# Patient Record
Sex: Male | Born: 1935
Health system: Southern US, Community
[De-identification: ages and names within clinical notes are randomized; demographics above are authoritative.]

## PROBLEM LIST (undated history)

## (undated) DIAGNOSIS — M1990 Unspecified osteoarthritis, unspecified site: Secondary | ICD-10-CM

## (undated) DIAGNOSIS — I10 Essential (primary) hypertension: Secondary | ICD-10-CM

## (undated) DIAGNOSIS — K859 Acute pancreatitis without necrosis or infection, unspecified: Secondary | ICD-10-CM

## (undated) DIAGNOSIS — A0472 Enterocolitis due to Clostridium difficile, not specified as recurrent: Secondary | ICD-10-CM

## (undated) DIAGNOSIS — I2510 Atherosclerotic heart disease of native coronary artery without angina pectoris: Secondary | ICD-10-CM

## (undated) DIAGNOSIS — N400 Enlarged prostate without lower urinary tract symptoms: Secondary | ICD-10-CM

## (undated) DIAGNOSIS — K8020 Calculus of gallbladder without cholecystitis without obstruction: Secondary | ICD-10-CM

## (undated) DIAGNOSIS — I341 Nonrheumatic mitral (valve) prolapse: Secondary | ICD-10-CM

## (undated) DIAGNOSIS — E785 Hyperlipidemia, unspecified: Secondary | ICD-10-CM

## (undated) DIAGNOSIS — M519 Unspecified thoracic, thoracolumbar and lumbosacral intervertebral disc disorder: Secondary | ICD-10-CM

## (undated) DIAGNOSIS — I498 Other specified cardiac arrhythmias: Secondary | ICD-10-CM

## (undated) HISTORY — DX: Essential (primary) hypertension: I10

## (undated) HISTORY — PX: CHOLECYSTECTOMY: SHX55

## (undated) HISTORY — DX: Atherosclerotic heart disease of native coronary artery without angina pectoris: I25.10

## (undated) HISTORY — DX: Other specified cardiac arrhythmias: I49.8

## (undated) HISTORY — DX: Unspecified osteoarthritis, unspecified site: M19.90

## (undated) HISTORY — DX: Unspecified thoracic, thoracolumbar and lumbosacral intervertebral disc disorder: M51.9

## (undated) HISTORY — PX: COLONOSCOPY: SHX174

## (undated) HISTORY — DX: Hyperlipidemia, unspecified: E78.5

## (undated) HISTORY — DX: Benign prostatic hyperplasia without lower urinary tract symptoms: N40.0

## (undated) HISTORY — DX: Acute pancreatitis without necrosis or infection, unspecified: K85.90

## (undated) HISTORY — DX: Nonrheumatic mitral (valve) prolapse: I34.1

## (undated) HISTORY — DX: Calculus of gallbladder without cholecystitis without obstruction: K80.20

---
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
## 1998-10-01 ENCOUNTER — Ambulatory Visit (HOSPITAL_COMMUNITY): Admission: RE | Admit: 1998-10-01 | Discharge: 1998-10-01 | Payer: Self-pay | Admitting: Internal Medicine

## 2000-03-18 ENCOUNTER — Other Ambulatory Visit: Admission: RE | Admit: 2000-03-18 | Discharge: 2000-03-18 | Payer: Self-pay | Admitting: Urology

## 2000-03-18 ENCOUNTER — Encounter (INDEPENDENT_AMBULATORY_CARE_PROVIDER_SITE_OTHER): Payer: Self-pay | Admitting: Specialist

## 2000-07-10 ENCOUNTER — Ambulatory Visit (HOSPITAL_COMMUNITY): Admission: RE | Admit: 2000-07-10 | Discharge: 2000-07-10 | Payer: Self-pay | Admitting: Cardiology

## 2000-08-11 HISTORY — PX: MITRAL VALVE REPAIR: SHX2039

## 2000-08-11 HISTORY — PX: CORONARY ARTERY BYPASS GRAFT: SHX141

## 2000-08-28 ENCOUNTER — Encounter: Payer: Self-pay | Admitting: Cardiothoracic Surgery

## 2000-09-01 ENCOUNTER — Encounter (INDEPENDENT_AMBULATORY_CARE_PROVIDER_SITE_OTHER): Payer: Self-pay | Admitting: *Deleted

## 2000-09-01 ENCOUNTER — Encounter: Payer: Self-pay | Admitting: Cardiothoracic Surgery

## 2000-09-01 ENCOUNTER — Inpatient Hospital Stay (HOSPITAL_COMMUNITY): Admission: RE | Admit: 2000-09-01 | Discharge: 2000-09-07 | Payer: Self-pay | Admitting: Cardiothoracic Surgery

## 2000-09-02 ENCOUNTER — Encounter: Payer: Self-pay | Admitting: Cardiothoracic Surgery

## 2000-09-03 ENCOUNTER — Encounter: Payer: Self-pay | Admitting: Cardiothoracic Surgery

## 2000-09-04 ENCOUNTER — Encounter: Payer: Self-pay | Admitting: Cardiothoracic Surgery

## 2000-10-06 ENCOUNTER — Encounter (HOSPITAL_COMMUNITY): Admission: RE | Admit: 2000-10-06 | Discharge: 2001-01-04 | Payer: Self-pay | Admitting: Cardiology

## 2000-12-29 ENCOUNTER — Encounter: Payer: Self-pay | Admitting: Family Medicine

## 2000-12-29 ENCOUNTER — Encounter: Admission: RE | Admit: 2000-12-29 | Discharge: 2000-12-29 | Payer: Self-pay | Admitting: Family Medicine

## 2001-10-13 ENCOUNTER — Encounter: Payer: Self-pay | Admitting: Specialist

## 2001-10-13 ENCOUNTER — Encounter: Admission: RE | Admit: 2001-10-13 | Discharge: 2001-10-13 | Payer: Self-pay | Admitting: Specialist

## 2004-09-26 ENCOUNTER — Emergency Department (HOSPITAL_COMMUNITY): Admission: EM | Admit: 2004-09-26 | Discharge: 2004-09-26 | Payer: Self-pay | Admitting: *Deleted

## 2005-08-01 ENCOUNTER — Ambulatory Visit: Payer: Self-pay | Admitting: Internal Medicine

## 2005-09-03 ENCOUNTER — Ambulatory Visit: Payer: Self-pay | Admitting: Internal Medicine

## 2006-12-22 ENCOUNTER — Ambulatory Visit: Payer: Self-pay | Admitting: Family Medicine

## 2007-02-25 ENCOUNTER — Ambulatory Visit: Payer: Self-pay | Admitting: Family Medicine

## 2007-03-26 ENCOUNTER — Ambulatory Visit: Payer: Self-pay | Admitting: Internal Medicine

## 2007-04-13 ENCOUNTER — Ambulatory Visit: Payer: Self-pay | Admitting: Internal Medicine

## 2007-04-13 LAB — CONVERTED CEMR LAB
Basophils Absolute: 0 10*3/uL (ref 0.0–0.1)
Eosinophils Absolute: 0.3 10*3/uL (ref 0.0–0.6)
HCT: 37.5 % — ABNORMAL LOW (ref 39.0–52.0)
MCHC: 35.5 g/dL (ref 30.0–36.0)
Monocytes Relative: 12.9 % — ABNORMAL HIGH (ref 3.0–11.0)
Platelets: 241 10*3/uL (ref 150–400)
RBC: 4.08 M/uL — ABNORMAL LOW (ref 4.22–5.81)
RDW: 12.8 % (ref 11.5–14.6)

## 2007-07-12 ENCOUNTER — Ambulatory Visit: Payer: Self-pay | Admitting: Internal Medicine

## 2007-12-23 ENCOUNTER — Ambulatory Visit: Payer: Self-pay | Admitting: Family Medicine

## 2008-01-27 ENCOUNTER — Ambulatory Visit: Payer: Self-pay | Admitting: Family Medicine

## 2008-02-23 ENCOUNTER — Ambulatory Visit: Payer: Self-pay | Admitting: Family Medicine

## 2008-04-10 ENCOUNTER — Ambulatory Visit: Payer: Self-pay | Admitting: Family Medicine

## 2008-05-02 ENCOUNTER — Encounter: Payer: Self-pay | Admitting: Internal Medicine

## 2008-07-11 ENCOUNTER — Ambulatory Visit: Payer: Self-pay | Admitting: Family Medicine

## 2008-08-18 ENCOUNTER — Ambulatory Visit: Payer: Self-pay | Admitting: Family Medicine

## 2008-10-10 ENCOUNTER — Ambulatory Visit: Payer: Self-pay | Admitting: Family Medicine

## 2008-10-31 ENCOUNTER — Ambulatory Visit: Payer: Self-pay | Admitting: Family Medicine

## 2008-12-27 ENCOUNTER — Ambulatory Visit: Payer: Self-pay | Admitting: Family Medicine

## 2009-06-07 ENCOUNTER — Ambulatory Visit: Payer: Self-pay | Admitting: Family Medicine

## 2009-10-01 ENCOUNTER — Ambulatory Visit: Payer: Self-pay | Admitting: Family Medicine

## 2009-11-01 ENCOUNTER — Ambulatory Visit: Payer: Self-pay | Admitting: Family Medicine

## 2009-11-08 ENCOUNTER — Ambulatory Visit (HOSPITAL_COMMUNITY): Admission: RE | Admit: 2009-11-08 | Discharge: 2009-11-08 | Payer: Self-pay | Admitting: Family Medicine

## 2009-11-08 ENCOUNTER — Encounter: Payer: Self-pay | Admitting: Family Medicine

## 2009-11-08 ENCOUNTER — Ambulatory Visit: Payer: Self-pay | Admitting: Surgery

## 2009-12-19 ENCOUNTER — Ambulatory Visit: Payer: Self-pay | Admitting: Family Medicine

## 2009-12-20 ENCOUNTER — Encounter: Admission: RE | Admit: 2009-12-20 | Discharge: 2009-12-20 | Payer: Self-pay | Admitting: Family Medicine

## 2010-05-02 ENCOUNTER — Ambulatory Visit: Payer: Self-pay | Admitting: Cardiology

## 2010-07-16 ENCOUNTER — Ambulatory Visit: Payer: Self-pay | Admitting: Family Medicine

## 2010-09-23 ENCOUNTER — Ambulatory Visit (INDEPENDENT_AMBULATORY_CARE_PROVIDER_SITE_OTHER): Payer: MEDICARE | Admitting: Family Medicine

## 2010-09-23 DIAGNOSIS — J069 Acute upper respiratory infection, unspecified: Secondary | ICD-10-CM

## 2010-10-01 ENCOUNTER — Ambulatory Visit (INDEPENDENT_AMBULATORY_CARE_PROVIDER_SITE_OTHER): Payer: MEDICARE | Admitting: Family Medicine

## 2010-10-01 DIAGNOSIS — J209 Acute bronchitis, unspecified: Secondary | ICD-10-CM

## 2010-10-01 DIAGNOSIS — J0100 Acute maxillary sinusitis, unspecified: Secondary | ICD-10-CM

## 2010-10-14 ENCOUNTER — Other Ambulatory Visit: Payer: Self-pay | Admitting: Family Medicine

## 2010-10-14 ENCOUNTER — Ambulatory Visit
Admission: RE | Admit: 2010-10-14 | Discharge: 2010-10-14 | Disposition: A | Discharge status: Home / Self Care | Payer: MEDICARE | Source: Ambulatory Visit | Attending: Family Medicine | Admitting: Family Medicine

## 2010-10-14 DIAGNOSIS — R05 Cough: Secondary | ICD-10-CM

## 2010-11-04 ENCOUNTER — Ambulatory Visit (INDEPENDENT_AMBULATORY_CARE_PROVIDER_SITE_OTHER): Payer: MEDICARE | Admitting: Family Medicine

## 2010-11-04 DIAGNOSIS — R0989 Other specified symptoms and signs involving the circulatory and respiratory systems: Secondary | ICD-10-CM

## 2010-11-04 DIAGNOSIS — I4949 Other premature depolarization: Secondary | ICD-10-CM

## 2010-11-04 DIAGNOSIS — R05 Cough: Secondary | ICD-10-CM

## 2010-11-06 ENCOUNTER — Encounter: Payer: Self-pay | Admitting: Nurse Practitioner

## 2010-11-06 ENCOUNTER — Encounter: Payer: Self-pay | Admitting: Cardiology

## 2010-11-06 ENCOUNTER — Ambulatory Visit (INDEPENDENT_AMBULATORY_CARE_PROVIDER_SITE_OTHER): Payer: MEDICARE | Admitting: Nurse Practitioner

## 2010-11-06 VITALS — BP 130/80 | HR 70 | Wt 193.0 lb

## 2010-11-06 DIAGNOSIS — R0600 Dyspnea, unspecified: Secondary | ICD-10-CM | POA: Insufficient documentation

## 2010-11-06 DIAGNOSIS — I10 Essential (primary) hypertension: Secondary | ICD-10-CM | POA: Insufficient documentation

## 2010-11-06 DIAGNOSIS — I493 Ventricular premature depolarization: Secondary | ICD-10-CM | POA: Insufficient documentation

## 2010-11-06 DIAGNOSIS — R002 Palpitations: Secondary | ICD-10-CM

## 2010-11-06 DIAGNOSIS — R079 Chest pain, unspecified: Secondary | ICD-10-CM

## 2010-11-06 DIAGNOSIS — Z9889 Other specified postprocedural states: Secondary | ICD-10-CM | POA: Insufficient documentation

## 2010-11-06 DIAGNOSIS — R059 Cough, unspecified: Secondary | ICD-10-CM | POA: Insufficient documentation

## 2010-11-06 DIAGNOSIS — E785 Hyperlipidemia, unspecified: Secondary | ICD-10-CM

## 2010-11-06 DIAGNOSIS — I4949 Other premature depolarization: Secondary | ICD-10-CM

## 2010-11-06 DIAGNOSIS — R05 Cough: Secondary | ICD-10-CM

## 2010-11-06 DIAGNOSIS — R0609 Other forms of dyspnea: Secondary | ICD-10-CM

## 2010-11-06 DIAGNOSIS — R5381 Other malaise: Secondary | ICD-10-CM

## 2010-11-06 DIAGNOSIS — R634 Abnormal weight loss: Secondary | ICD-10-CM | POA: Insufficient documentation

## 2010-11-06 DIAGNOSIS — R5383 Other fatigue: Secondary | ICD-10-CM

## 2010-11-06 DIAGNOSIS — I2510 Atherosclerotic heart disease of native coronary artery without angina pectoris: Secondary | ICD-10-CM | POA: Insufficient documentation

## 2010-11-06 LAB — BASIC METABOLIC PANEL
Calcium: 9.1 mg/dL (ref 8.4–10.5)
Glucose, Bld: 97 mg/dL (ref 70–99)
Potassium: 3.7 mEq/L (ref 3.5–5.3)
Sodium: 141 mEq/L (ref 135–145)

## 2010-11-06 NOTE — Assessment & Plan Note (Signed)
?   Etiology. Labs are checked today to include TSH.

## 2010-11-06 NOTE — Assessment & Plan Note (Signed)
He is down 7 pounds since September. Will continue to monitor. He has had a negative CXR. If his labs and echo are ok, would consider CT scanning for further evaluation.

## 2010-11-06 NOTE — Progress Notes (Signed)
History of Present Illness: [name] [name] is seen today for a work in visit. He is seen for Dr. Martinique. He has not been feeling well for the past 6 weeks. He has had this cough that will not go away. It is not productive. He has also been tired and having DOE. This is clearly a change for him. He denies chest pain. He has been seeing his PCP. He has had a negative CXR. An EKG showed PVC's.  He has not had any labs. He has lost about 7 pounds of weight which is unusual for him. He is not really dizzy or lightheaded. He has no edema. The cough does not occur when he lies down. No PND or orthopnea is reported.   Current Outpatient Prescriptions on File Prior to Visit  Medication Sig Dispense Refill  . amLODipine (NORVASC) 5 MG tablet Take 5 mg by mouth daily.        Marland Kitchen aspirin 325 MG EC tablet Take 325 mg by mouth daily.        . Cholecalciferol (VITAMIN D) 1000 UNITS capsule Take 1,000 Units by mouth daily.        Marland Kitchen dutasteride (AVODART) 0.5 MG capsule Take 0.5 mg by mouth daily.        . multivitamin (THERAGRAN) per tablet Take 1 tablet by mouth daily.        . rosuvastatin (CRESTOR) 20 MG tablet Take 20 mg by mouth daily.        . Tamsulosin HCl (FLOMAX) 0.4 MG CAPS Take 0.4 mg by mouth daily.        . valsartan-hydrochlorothiazide (DIOVAN-HCT) 320-12.5 MG per tablet Take 1 tablet by mouth daily.        . nebivolol (BYSTOLIC) 5 MG tablet Take 5 mg by mouth daily.          No Known Allergies  Past Medical History  Diagnosis Date  . MVP (mitral valve prolapse)     s/p Mitral Valve Repair  . CAD (coronary artery disease)     s/p CABG x 1  . HTN (hypertension)   . Hyperlipidemia   . DJD (degenerative joint disease)   . BPH (benign prostatic hyperplasia)   . Arthritis   . Lumbar disc disease     Past Surgical History  Procedure Date  . Cholecystectomy   . Mitral valve repair January 2002  . Coronary artery bypass graft January 2002    Single bypass to the distal RCA    History  Smoking  status  . Never Smoker   Smokeless tobacco  . Not on file    History  Alcohol Use No    Family History  Problem Relation Age of Onset  . Stroke Father     Review of Systems: The review of systems is as above. His blood pressure has been averaging in the 572'I systolic at home. Rarely, it will be elevated.  All other systems were reviewed and are negative.  Physical Exam: BP 130/80  Pulse 70  Wt 193 lb (87.544 kg) He is pleasant and in no acute distress. Skin is warm and dry. Color is normal. HEENT is negative. Lungs are clear. Cardiac exam shows a regular rate and rhythm. He has frequent ectopics.He has a soft systolic murmur.  Abdomen is soft. Extremities are without edema. Gait and ROM are intact. He has no gross neurologic deficits.  ECG: Sinus with PVC's and PAC's today. Tracing is reviewed with Dr. Martinique.  Assessment / Plan:

## 2010-11-06 NOTE — Assessment & Plan Note (Signed)
?   Etiology. Will send for an echo. His last study was in 2009. Will check a BNP, CBC, CMET and TSH today.

## 2010-11-06 NOTE — Assessment & Plan Note (Signed)
PVC's noted on today's EKG. He tries to limit his caffeine intake and does not have excessive alcohol. He is asymptomatic. Will await echo results.

## 2010-11-06 NOTE — Patient Instructions (Signed)
We are going to check labs today.  We are going to send you for a repeat ultrasound of your heart. Stay on your current medicines. Keep your appointment with Dr. Martinique in April.

## 2010-11-06 NOTE — Assessment & Plan Note (Signed)
He is not on ACE inhibitor. He has had a negative CXR. May need to consider PPI therapy. May need CT of the chest on return.

## 2010-11-07 ENCOUNTER — Telehealth: Payer: Self-pay | Admitting: *Deleted

## 2010-11-07 ENCOUNTER — Ambulatory Visit: Payer: MEDICARE | Admitting: Nurse Practitioner

## 2010-11-07 LAB — BRAIN NATRIURETIC PEPTIDE: Brain Natriuretic Peptide: 64.1 pg/mL (ref 0.0–100.0)

## 2010-11-07 NOTE — Telephone Encounter (Signed)
Notified of lab results. 

## 2010-11-07 NOTE — Telephone Encounter (Signed)
Message copied by Derek Mound on Thu Nov 07, 2010  4:25 PM ------      Message from: Truitt Merle      Created: Thu Nov 07, 2010  1:18 PM       Ok to report. Labs are satisfactory. BNP is nice and low. Dyspnea probably more pulmonary related.      He could do a trial of PPI therapy for his cough (Prilosec, nexium, etc.)       Will see what echo shows.

## 2010-11-13 ENCOUNTER — Ambulatory Visit (HOSPITAL_COMMUNITY): Payer: MEDICARE | Attending: Nurse Practitioner | Admitting: Radiology

## 2010-11-13 DIAGNOSIS — Z9889 Other specified postprocedural states: Secondary | ICD-10-CM

## 2010-11-13 DIAGNOSIS — R059 Cough, unspecified: Secondary | ICD-10-CM | POA: Insufficient documentation

## 2010-11-13 DIAGNOSIS — R05 Cough: Secondary | ICD-10-CM | POA: Insufficient documentation

## 2010-11-13 DIAGNOSIS — I059 Rheumatic mitral valve disease, unspecified: Secondary | ICD-10-CM | POA: Insufficient documentation

## 2010-11-18 ENCOUNTER — Telehealth: Payer: Self-pay | Admitting: *Deleted

## 2010-11-18 NOTE — Telephone Encounter (Signed)
Message copied by Derek Mound on Mon Nov 18, 2010  2:01 PM ------      Message from: Martinique, PETER      Created: Thu Nov 14, 2010 12:17 PM       I reviewed recent Echo and compared with prior study in 2009. I disagree with the report in that the LV size is only mildly dilated, not severe. EF is well preserved. Atrial enlargement is stable. MV repair still looks good. Overall a good report. Please report to patient.

## 2010-11-18 NOTE — Telephone Encounter (Signed)
Notified of Echo results; sent copy to Dr. Redmond School

## 2010-11-27 ENCOUNTER — Ambulatory Visit: Payer: MEDICARE | Admitting: Cardiology

## 2010-11-27 ENCOUNTER — Telehealth: Payer: Self-pay | Admitting: *Deleted

## 2010-11-27 NOTE — Telephone Encounter (Signed)
Came by and gave list of BP readings; range 126/83 to 153/81; Per Dr. Martinique continue to take same meds. Continue to monitor BP. OV in May.

## 2010-12-05 ENCOUNTER — Institutional Professional Consult (permissible substitution): Payer: MEDICARE | Admitting: Pulmonary Disease

## 2010-12-24 NOTE — Assessment & Plan Note (Signed)
[place]                             PULMONARY OFFICE NOTE   NAME:Schnorr, GEAN                        MRN:          [id number]  DATE:03/26/2007                            DOB:          10-12-1929    For pulmonary followup.   PROBLEM:  Cough.   HISTORY:  When seen over a year ago cough had responded to change from  lisinopril to Diovan and was attributed to ACE inhibitor.  He had no  problems from then until about a month ago when he developed a  significant head cold, sinus congestion, sore throat with some fever.  Dr. Redmond School treated with an antibiotic which helped some, clearing most  of his head discomfort but with persistent cough.  He also tried a  prednisone taper which has not made enough difference.  Cough is noted  mostly if he is up and ambulatory.  He has some dry throat sensation  with no difficulty swallowing.  Feels tired. Denies fever, sweats,  purulent discharge.  He is definitely aware of occasional reflux.  Says  cough is tiresome.  Has noted urinary frequency   CURRENT MEDICATIONS:  Flomax 0.4 mg, Vytorin 10/20, Avodart 0.5 mg,  Cosamin DS, aspirin 325 mg, Diovan 320/12.5, Norvasc 5 mg, prednisone  ending 03/27/2007.   ALLERGIES:  No medication allergy.  ACE INHIBITOR CAUSED COUGH.   OBJECTIVE:  VITAL SIGNS:  Weight 197 pounds, blood pressure 132/78,  pulse 73, room air saturation 97%.  HEENT:  Pharynx is red and cobblestoned without visible drainage.  Not  really hoarse, some dry cough noted, no stridor.  LUNGS:  Clear.  No adenopathy.   IMPRESSION:  Red pharynx suggests either rhinosinusitis with drainage or  gastroesophageal reflux disease.   PLAN:  Try Doxycycline for 7 days, Phenergan with codeine cough syrup 5  mL q.6 hours p.r.n.  If no better he is then to start samples of Zegerid  40/1100 one capsule daily as discussed.  Return as scheduled.     Clinton D. Annamaria Boots, MD, Shade Flood, FACP  Electronically Signed    CDY/MedQ  DD: 03/26/2007  DT: 03/27/2007  Job #: [id number]   cc:   Jill Alexanders, M.D.  Peter M. Martinique, M.D.

## 2010-12-24 NOTE — Assessment & Plan Note (Signed)
[place]                             PULMONARY OFFICE NOTE   NAME:[name] [name], [name] [name]                        MRN:          [id number]  DATE:04/13/2007                            DOB:          [birth year]    PROBLEM:  1. Bronchitis with cough.  2. History ACE inhibitor cough.  3. Rhinitis.   HISTORY:  Cough had cleared for awhile when lisinopril was stopped.  Now  in the last few days especially he has had deeper cough and some wheeze.  Three days ago frontal sinuses felt congested and hurt him when he  coughed.  Not much discharge.  Today the frontal sinus pain is cleared.  He is still trying Zegerid samples but does not recognize reflux.  No  cough at night or when quiet.  Trace yellow phlegm.  Little postnasal  drip.  He had used a nasal steroid in the spring.  No nasal discharge.  He had previously tried saline, nasal lavage, without help.  Feels  tired.   MEDICATIONS:  His list is charted and reviewed.  Not significantly  changed from August 15 visit.   OBJECTIVE:  Weight 199 pounds, blood pressure 122/80, pulse 75, room air  saturation 97%.  Septal deviation.  Pale nasal mucosa.  Reddish pharynx  without visible drainage.  No stridor.  No adenopathy.  Active gag.  Minor dry cough.  I do not hear wheeze or rhonchi.   IMPRESSION:  Cough, probably with components of bronchitis and rhinitis.  Watch for reflux.   PLAN:  1. Singulair 10 mg.  2. He has some leftover Beconase.  We are going to let him use that up      and then add a sample of Veramyst one or two sprays each nostril      b.i.d. while we explore what nasal steroids will do for him.  3. CBC with diff.  4. Stop Phenergan.  5. Schedule chest x-ray.  6. Schedule return in 2 months, earlier p.r.n.     Clinton D. Annamaria Boots, MD, Shade Flood, FACP  Electronically Signed    CDY/MedQ  DD: 04/13/2007  DT: 04/14/2007  Job #: [id number]   cc:   Jill Alexanders, M.D.  Peter M. Martinique, M.D.

## 2010-12-24 NOTE — Assessment & Plan Note (Signed)
[name] [name]                             PULMONARY OFFICE NOTE   NAME:Gazzola, IMARI                        MRN:          [id number]  DATE:07/12/2007                            DOB:          28-Feb-1930    PROBLEMS:  1. Bronchitis with cough.  2. History ACE inhibitor cough.  3. Rhinitis.   HISTORY:  She has continued Singulair and Beconase. She gradually noted  cough getting much better and now almost gone. She had some wheezing,  but credits Singulair for clearing that. She finished the sample and has  been off almost a month. She thinks, in retrospect, that cough began  with a sinus infection, but there are no symptoms of that. She has had  flu shot.   MEDICATIONS:  Medications were reviewed as charted.   OBJECTIVE:  Weight 208 pounds, blood pressure 128/62, pulse 67, room air  saturation 95%. She is not coughing at all. Lungs are very clear. Nasal  airway is clear with no post-nasal drip or pharyngeal irritation. She  looks comfortable.   IMPRESSION:  Cough may have been from post-nasal drip or a mild  bronchitis with asthma. It seems to be resolved at this time or  inactive.   PLAN:  We discussed Singulair and I gave refillable prescription. She  may want to see how she does without it. I will be happy to see her  again p.r.n. and this may become useful as the seasons change.     Clinton D. Annamaria Boots, MD, Shade Flood, FACP  Electronically Signed    CDY/MedQ  DD: 07/18/2007  DT: 07/19/2007  Job #: [id number]   cc:   Jill Alexanders, M.D.

## 2010-12-27 NOTE — Discharge Summary (Signed)
[place]. [facility]  Patient:    [name] [name], [name] [name]                        MRN: [id number] Adm. Date:  [id number] Disc. Date: [id number] Attending:  Montine Circle Dictator:   Ricki Miller, P.A. CC:         Peter M. Martinique, M.D.  Synthia Innocent, M.D.   Discharge Summary  HISTORY OF PRESENT ILLNESS:  This is a very pleasant 77 year old male referred by Dr. Martinique for evaluation of mitral valve disease.  In September 2001, a murmur was detected in the routine physical exam.  Initially, he was asymptomatic.  Later, the patient experienced several episodes of vertigo and vague anginal symptoms.  CT scan and carotid ultrasound were negative.  A 2D echocardiogram revealed marked prolapse of the posterior leaflet of the mitral valve with moderate to severe mitral regurgitation and moderate left atrial enlargement.  Cardiac catheterization was subsequently done and this revealed normal coronary arteries with severe mitral regurgitation on ventriculogram. His left ventricular function was preserved.  Dr. Servando Snare recommended mitral valve repair versus replacement and he was admitted this hospitalization for the procedure.  PAST MEDICAL HISTORY: 1. Mitral valve prolapse with moderate to severe mitral insufficiency. 2. Hypertension. 3. Benign prostatic hypertrophy with two negative biopsies. 4. Arthritis in the hands. 5. Previous history of acute pancreatitis. 6. Chronic back pain. 7. Mild aortic insufficiency. 8. Questionable history of retinal detachment.  PAST SURGICAL HISTORY:  Cholecystectomy.  MEDICATIONS: 1. Aspirin 325 mg q.d. 2. Hytrin 10 mg q.d. 3. Altace 5 mg q.d. 4. Toprol XL 25 mg q.d. 5. Glucosamine q.d. 6. Protonix 40 mg q.d. 7. Folic acid 638 mcg 4-6/6 tablets p.o. q.d. 8. Ferrous sulfate 325 mg b.i.d. (Note, the folic acid and ferrous sulfate were on hold since August 27, 2000).  ALLERGIES:  No known drug allergies.  For  family history, social history, review of systems and physical exam, please see the History and Physical done at the time of admission.  HOSPITAL COURSE:  The patient was admitted electively and taken to the operating room where he underwent mitral valve repair with a #31 St. Jude annuloplasty ring and coronary artery bypass graft x 1.  The patient tolerated the procedure well.  Initially, as the patient came off cardiopulmonary bypass, there was some ST elevation in the inferior leads.  There was some questionable opinion as to whether he may have received an air bolus.  He subsequently received the above-mentioned saphenous vein graft to the right coronary artery bypass and came off cardiopulmonary bypass in stable condition.  Transesophageal echocardiogram revealed good left ventricular function and good function of the mitral valve.  Postoperatively, the patient has done well.  He has had stable hemodynamics. He has remained neurologically intact.  He had no elevation in his postoperative CK/MB bands.  He has been started on a course of Coumadin and his INR has been followed.  He did have some difficulties initially with urinary retention, but his Foley was subsequently able to be removed and he has been able to void on his own.  Urine cultures have been negative thus far. He does have some microscopic hematuria noted.  His incisions are healing well without signs of infection.  He is tolerating routine activities commensurate for level of postoperative convalescence.  Overall, he is felt to be quite stable for discharge on September 07, 2000, pending morning round reevaluation by Dr.  Gerhardt.  CONDITION ON DISCHARGE:  Stable and improved.  DISCHARGE DIAGNOSES: 1. Mitral regurgitation and prolapse, as described above. 2. Hypertension. 3. Benign prostatic hyperplasia. 4. Osteoarthritis. 5. History of pancreatitis. 6. Chronic back pain. 7. Status post cholecystectomy.  DISCHARGE  MEDICATIONS: 1. Folic acid 1 mg q.d. 2. Coumadin 5 mg q.d. and as directed. 3. Lasix 40 mg q.d. x 1 week. 4. Potassium chloride 20 mEq q.d. x 1 week. 5. Tylox p.r.n. 6. Cardura 4 mg q.h.s.  FOLLOWUP:  Follow up with Dr. Servando Snare in three weeks.  Follow up with Dr. Martinique in two weeks with INR to be checked on September 09, 2000, at Dr. Morrison Old office.  SPECIAL INSTRUCTIONS:  The patient will receive written instructions in regard to medications, activity, diet, wound care and followup. DD:  09/07/00 TD:  09/07/00 Job: [id number]

## 2010-12-27 NOTE — Procedures (Signed)
[place]. [facility]  Patient:    [name] [name], [name] [name]                        MRN: [id number] Proc. Date: 09/01/00 Adm. Date:  [id number] Attending:  Montine Circle                           Procedure Report  ANESTHESIA PROCEDURE:  Intraoperative transesophageal echocardiography.  INDICATIONS:  [name] [name] is a 77 year old gentleman, who presented today for mitral valve repair or replacement by Dr. Percell Miller B. Gerhardt.  He was brought to the holding area the morning of surgery, where under local anesthesia with sedation, radial, arterial, and pulmonary artery catheters were inserted.  The patient was taken to the operating room for routine induction of general anesthesia, the trachea was intubated, the TEE probe was lubricated and passed oropharyngeally into the stomach and then withdrawn for imaging of the cardiac structures.  PRECARDIOPULMONARY BYPASS TEE EXAMINATION:  LEFT VENTRICLE:  This is a moderately hypertrophied left ventricular chamber, overall left ventricular dimension size is increased.  There was good overall contractility and all segments in the short axis and just above the short axis view.  In a long axis view, there was good overall contractility anterior and posterior walls.  There were no masses noted within.  MITRAL VALVE:  Initially, the mitral valve was grossly abnormal in its appearance, thickened in both leaflets.  There was a significant ballooning appearing in the posterior leaflet that prolapses as well into the left atrial chamber with an almost umbrella looking effect during systolic contraction with the posterior leaflet ballooning well into the left atrium.  In addition, there was a small filament.  I cannot tell whether this is a small ruptured chordae or portion of the leaflet that appeared just above the annular area into the left atrium.  On Doppler examination, there is mitral regurgitant jet noted that goes beneath  the posterior leaflet out across the top of the anterior leaflet and into the left atrial chamber.  LEFT ATRIUM:  This was a mildly enlarged chamber.  The interatrial septum was interrogated and was intact.  All pulmonary veins were able to be demonstrated.  We could not demonstrate any significant reversal of flow in the pulmonary veins.  There were no masses noted within.  The interatrial septum was also interrogated again no masses were noted within.  RIGHT VENTRICLE:  This was normal, heavy trabeculated right ventricular chamber.  TRICUSPID VALVE:   Mobile tricuspid valve.  On Doppler examination, there was 1 to 1.5+ tricuspid regurgitant flow noted.  RIGHT ATRIUM:   Normal right atrial chamber.  Pulmonary artery catheter was noted within.  The patient was placed on cardiopulmonary bypass, hypothermia was begun. Repair of the posterior leaflet was carried out by Dr. Servando Snare, including a #31 ring placement.  Multiple deairing maneuvers were carried out.  The patient was rewarmed and prepared for separation from cardiopulmonary bypass.  POSTCARDIOPULMONARY BYPASS TEE EXAMINATION:  (Limited examination.)  LEFT VENTRICLE:  Early in the bypass period with the initial separation from cardiopulmonary bypass and inspite of multiple deairing maneuvers, there were significant ______ noted in the left atrial and left ventricular chambers and in the aortic root.  The patient was kept in the head down position, the vents were maintained and over a period of time, the air was able to be removed or it disappeared.  MITRAL VALVE:  In place of the diseased mitral valve now could be seen the anterior leaflet well.  The struts and suture lines around the ring could also be visualized.  There was an interesting small filament that appeared attached at the base of where the ring attached in the annular area.  This was a couple of cm to a 1.5 cm in length and appeared in the left atrial chamber.   It was not apparent whether this was just an attached but free end of a chordae or an actual filament from the suture line used.  This remained throughout the bypass period.  On Doppler examination at the level of the mitral valve, there was just trace early regurgitant flow noted at the periphery of the ring. However, the major part of the regurgitant flow was essentially gone.  The leaflets appeared to coapt appropriately during systolic ejection.  Doppler examination revealed essentially minimal mitral regurgitant flow compared to the prebypass period.  During a period of time after separation there was noted significant ST segment elevations.  Examination of the heart at this time revealed what appeared to be a fairly dyskinetic area in the posterior aspect of the septum as it runs into the inferior border, inferior aspect of the heart in the short axis view.  The inferior border in the short axis view appeared remarkably hypokinetic and significantly diminished during these period of these ST elevations.  Please refer to Dr. Carrie Mew note on the subsequent events, but there were multiple episodes, ultimately requiring a return to bypass and a right coronary artery bypass grafting by Dr. Servando Snare.  On the subsequent separation from cardiopulmonary bypass after the single bypass had been performed, the left ventricular chamber appeared to be hyperdynamic, all segments appeared contractile and thickening in the appropriate fashion in the short axis view there was fairly good contractility all around and including the inferior wall, which had been problematic before.  The mitral valve the mitral valve apparatus appeared again to be seated in the usual fashion.  There was essentially no changes from the previous separation from cardiopulmonary bypass.  The degree of mitral regurgitant flow was essentially gone.  The small filament that had appeared before at the periphery was still  remain, but did not appear to be hemodynamically significant in any way other than being present.  The rest of the cardiac examination was essentially unchanged.  The patient  did well after the second separation from cardiopulmonary bypass would refer to the rest of the video tape for demonstration of that.  The patient was returned to the cardiac intensive care unit in stable condition. DD:  09/01/00 TD:  09/01/00 Job: [id number]

## 2010-12-27 NOTE — H&P (Signed)
[place]. [facility]  Patient:    [name] [name], [name] [name]                        MRN: [id number] Adm. Date:  08/31/00 Attending:  Lilia Argue. Servando Snare, M.D. Dictator:   Sharene Butters, P.A. CC:         [name], M.D.  Peter M. Martinique, M.D.  Synthia Innocent, M.D.   History and Physical  DATE OF BIRTH:  [birth year].  CHIEF COMPLAINT:  Mitral valve disease - mitral regurgitation.  HISTORY OF PRESENT ILLNESS:  [name] [name] is a pleasant 77 year old white male referred by Dr. Martinique for evaluation of mitral valve disease.  In September 2001, a murmur was detected in a routine physical exam.  Initially, asymptomatic.  Later on the patient experienced several episodes of vertigo and vague anginal symptoms.  A CT scan - carotid ultrasound were negative. However, a 2-D echocardiogram revealed marked prolapse of the posterior leaflet with moderate to severe MR and moderate left atrial enlargement. Cardiac catheterization showed normal coronary arteries.  He has severe MR on ventriculogram with preserved LV function.  Dr. Servando Snare recommended to proceed with MVR - replacement scheduled for September 01, 2000.  He has been experiencing some anginal symptoms, accompanied with occasional shortness of breath.  He also has dyspnea on exertion when walking up stairs.  No PND.  He denies any decreased energy level.  No peripheral edema.  No polyuria or hematuria.  He does have no nocturia.  No abdominal fullness.  No hematochezia.  No cough or sputum production.  No new episodes of syncope.  No presyncope.  No fever or chills.  PAST MEDICAL HISTORY: 1. MVP with moderate to severe mitral insufficiency. 2. Hypertension. 3. Benign prostatic hypertrophy with two negative biopsies. 4. Arthritis in the hands. 5. Previous history of acute pancreatitis. 6. Chronic back pain. 7. Mild AI. 8. Questionable history of retinal detachment.  SURGERIES: 1. Status post cardiac  catheterization in July 01, 2000, Dr. ______. 2. Status post cholecystectomy in 1995.  MEDICATIONS: 1. Aspirin 325 mg p.o. q.d. on hold. 2. Hytrin 10 q.d. 3. Altace 5 mg q.d. 4. Toprol XL 25 mg q.d. 5. Glucosamine q.d. 6. Protonix 40 mg p.o. q.d. 7. Folic acid 160 micrograms 2-1/2 p.o. q.d. (on hold since August 27, 2000). 8. Ferrous sulfate 325 mg p.o. b.i.d. (on hold since August 27, 2000).  ALLERGIES:  No known drug allergies.  REVIEW OF SYSTEMS:  See history of present illness and past medical history for significant positives.  He denies any history of diabetes, kidney disease, cerebrovascular accident, TIA or amaurosis fugax.  FAMILY HISTORY:  Mother alive and well at age 36.  Father died at 42 of CVA.  SOCIAL HISTORY:  The patient is married and he has two children.  One son is a Magazine features editor at [employer].  He is a retired Programme researcher, broadcasting/film/video at [employer].  He is active, he plays golf.  He used to play tennis, however, he stopped secondary to the symptoms.  No tobacco or alcohol intake.  PHYSICAL EXAMINATION:  Well-developed, well-nourished 77 year old white male in no acute distress.  Alert and oriented x 3 who looks younger than his stated age.  VITAL SIGNS:  Blood pressure 150/82, pulse 64, respirations 18.  HEENT:  Normocephalic and atraumatic, PERRLA, EOMI, bilateral cataract formation.  No glaucoma or macular degeneration.  NECK:  Supple.  A 4 cm JVD bilaterally.  No bruits or  lymphadenopathy.  CHEST:  Symmetrical on inspirations.  LUNGS:  Show no wheezes or rhonchis.   There is a trace of rales at the bases bilaterally.  No lymphadenopathy.  CARDIOVASCULAR:  Regular rate and rhythm with a 3/6 systolic murmur, with late systolic peak, best heard at the apex.  No rubs or gallops.  ABDOMEN:  Soft, nontender.  Bowel sounds x 4. N masses or bruits.  GU AND RECTAL:  Deferred.  EXTREMITIES:  No clubbing or cyanosis.  There is a trace of edema in the  left lower extremity.  NO ulcerations.  Temperature warm.  PERIPHERAL PULSES:  Carotids 2+ bilaterally.  Femoral 2+ bilaterally. Popliteal, dorsalis pedis, and posterior tibialis 2+ bilaterally.  NEUROLOGIC:  Nonfocal, normal gait.  DTRs 2+ bilaterally.  Muscle strength 5/5.  ASSESSMENT AND PLAN:  Mitral valve disease for MVR - replacement on September 01, 2000, at [facility] by Dr. Servando Snare.  Dr. Servando Snare has seen and evaluated this patient prior to the admission and has explained the risks and benefits involving the procedure and the patient has agreed to continue. DD:  08/31/00 TD:  08/31/00 Job: [id number]

## 2010-12-27 NOTE — Op Note (Signed)
[place]. [facility]  Patient:    [name] [name], [name] [name]                        MRN: [id number] Proc. Date: 09/01/00 Adm. Date:  [id number] Attending:  Montine Circle CC:         Peter M. Martinique, M.D.   Operative Report  PREOPERATIVE DIAGNOSIS:  Severe mitral regurgitation.  POSTOPERATIVE DIAGNOSIS:  Severe mitral regurgitation with prolapse and flail segment of the posterior leaflet and question of right coronary artery spasm.  OPERATION:  Mitral valve repair with quadrangular resection of the middle scallop of the posterior leaflet and placement of St. Jude Taylor flexible annuloplasty ring and coronary artery bypass grafting times one to the distal right coronary artery.  SURGEON:  Lilia Argue. Servando Snare, M.D.  FIRST ASSISTANT:  Revonda Standard. Roxan Hockey, M.D.  SECOND ASSISTANT:  Sheliah Hatch, P.A.  BRIEF HISTORY:  The patient is a 77 year old male who presented with increasing symptoms of dyspnea and was noted to have a new murmur.  Further evaluation by Dr. Peter Martinique including echocardiogram confirmed severe mitral regurgitation with preserved LV function.  A cardiac catheterization showed luminal irregularities of the right coronary artery but without severe stenosis in the left or right coronary system.  Because of the patients progressing symptoms and severity of the mitral regurgitation, mitral valve repair was recommended.  The patient agreed and signed informed consent.  DESCRIPTION OF PROCEDURE:  With Swan-Ganz and arterial line monitors in place, the patient underwent general endotracheal anesthesia without incidence.  A transesophageal echocardiogram probe was placed and manipulated by Dr. Orene Desanctis with report under separate dictation; however, the diagnosis was confirmed with TEE with significant mitral regurgitation and prolapsing of the middle scallop of the posterior leaflet.  He also had trace to mild aortic insufficiency and overall  preserved LV function.  He had slight enlargement of the left atrium.  The patients skin of the chest and legs was prepped with Betadine and draped in the usual sterile manner.  A median sternotomy was performed.  The pericardium was opened. The patient did have evidence of biventricular enlargement.  He was systemically heparinized.  The ascending aorta was cannulated.  Superior and inferior vena cava cannulae were placed. A retrograde cardioplegia catheter was placed through a separate stab wound in the right atrium.  An aortic root vent cardioplegia needle was introduced into the ascending aorta.  The patient was placed on cardiopulmonary bypass 2.4 liters per minute per meter squared.  The patients body temperature was then cooled to 30 degrees.  Aortic cross clamp was applied.  Then 500 cc of cold blood potassium cardioplegia was administered antegrade.  The left atrium was opened along the inner atrial groove with proper retraction good exposure of the mitral valve was obtained.  Careful examination of the mitral valve revealed the anterior leaflet was not particularly degenerated with myxomatous degeneration.  The primary lesion noted was severe prolapse of the middle portion of the middle scallop of the posterior leaflet.  It was decided that with the anatomic situation an adequate repair could be obtained.  A portion of the middle scallop of the posterior leaflet was excised as a quadrangular resection.  Using a #2 Tycron pledgeted suture, placed along the annulus at the site of resection.  The leaflet edges of the posterior leaflet both appeared to be the same height and were easily reapproximated with 5-0 Ethibon sutures.  With this repair completed, there was  passive filling of the ventricle with cold saline.  Appeared to give a good repair of the mitral valve.  The anterior leaflet and inner tract distance was sized to use a #31 [employer]. Jude annuloplasty ring.  The ring was  used as a complete flexible ring.  Then #2 Tycron sutures were placed along the mitral valve annulus in circumferential manner.  The ring was then secured to the sutures and lowered into place and secured.  With the ring holder removed, the valve was again tested for competency passively and appeared to be very adequate.  The patients body temperature was rewarmed to 37 degrees.  The aortotomy was closed with a horizontal 3-0 Prolene suture.  Prior to complete closure of the atrium, the head was put in a down position.  The heart was allowed to passively fill and deair the atrium.  With the atrium closed, aortic cross clamp was removed with a total cross clamp time of 103 minutes.  The patient had prompt rise in myocardial septal temperature.  A 16 gauge needle was introduced into the left ventricular apex to further deair the heart.  The patients body temperature was rewarmed to 37 degrees.  Transiently he developed a heart block but prior to coming off bypass it returned to a sinus rhythm.  He was started on low dose dopamine.  TEE showed good repair without evidence of significant mitral regurgitation and good global LV function.  The patient was ventilated and weaned from cardiopulmonary bypass with the aortic root vent in place to further deair the heart.  The patient was separated from cardiopulmonary bypass with total pump time of 163 minutes.  He was decannulated.  He remained hemodynamically stable.  He was decannulated in the usual fashion.  Protamine sulfate was administered.  With the operative field hemostatic, attempt to reapproximate the pericardium and the chest, the patient had evidence of ST elevation in the inferior leads.  The process of closing the chest was stopped and chest was completely opened and examined. An area along the acute margin of the heart appeared to be slightly hypokinetic.  This rise in ST segments was transient and returned completely normal with  stable hemodynamics.  Initially this was thought to be related to possible retained air in the coronary system.  The patient was observed for a  period of time and remained stable.  After 10 to 15 minutes again, had transient ST elevation.  This occurred several times and each time appeared to be related to an area along the acute margin of the heart becoming hypokinetic and the issue of possible spasm in the right coronary artery was entertained. The patient was given Procardia and started on Cardizem sublingually with some improvement and then started on Cardizem drip.  Even with the Cardizem drip he continued to have transient elevation and then return to normal of the ST segments.  Because of this repeated transient changes, it was decided a graft to the right coronary artery should be done to ensure stability, though we could not determine if this was related to spasm or possible plaque rupture in nonobstructive right coronary artery but one that was somewhat thick.  The patient was again systemically heparinized.  Ascending aorta was cannulated. A single stage venous cannula was placed back in the right atrium.  The patient was placed back on cardiopulmonary bypass.  An aortic root vent cardioplegia needle was placed back into the ascending aorta.  Aortic cross clamp was applied and 500 cc  of cold blood potassium cardioplegia was administered.  The distal right coronary artery was opened and with a large vessel greater than 2 mm in size at the site of anastomosis, no obvious disease, dissection or debride was noted.  One and a half probe passed into the proximal PD and PL branches without difficulty.  A 2 mm probe passed proximally without difficulty.  Using a running 7-0 Prolene a vein from the right lower extremity was anastomosed to the distal right coronary artery. The aortic cross clamp was removed.  The total cross clamp time was 12 minutes.  The patient was then rewarmed and  weaned.  After performing a proximal anastomosis with running 6-0 Prolene, the air was evacuated from the aorta and the ascending graft.  The patient was then ventilated and weaned from cardiopulmonary bypass.  This time remaining stable without evidence of ST elevation.  He was decannulated in the usual fashion.  Total pump time was 225 minutes.  With operative field hemostatic, two atrial and two ventricular pacing wires were applied.  Single graft marker was applied.  Two mediastinal tubes were left in place.  The sternum was closed with #6 stainless steel wire.  Fascia was closed with interrupted 0 Vicryl, running 3-0 Vicryl in the subcutaneous tissue, 4-0 subcuticular stitch in the skin edges.  Dry dressings were applied.  Sponge and needle counts were reported as correct at the completion of the procedure.  The patient tolerated the procedure and was transferred to surgical intensive care unit in stable condition with normal ST segments. DD:  09/02/00 TD:  09/03/00 Job: [id number]

## 2011-01-03 ENCOUNTER — Encounter: Payer: Self-pay | Admitting: Cardiology

## 2011-01-03 ENCOUNTER — Ambulatory Visit (INDEPENDENT_AMBULATORY_CARE_PROVIDER_SITE_OTHER): Payer: Medicare Other | Admitting: Cardiology

## 2011-01-03 DIAGNOSIS — I10 Essential (primary) hypertension: Secondary | ICD-10-CM

## 2011-01-03 DIAGNOSIS — I2510 Atherosclerotic heart disease of native coronary artery without angina pectoris: Secondary | ICD-10-CM

## 2011-01-03 DIAGNOSIS — I493 Ventricular premature depolarization: Secondary | ICD-10-CM

## 2011-01-03 DIAGNOSIS — Z9889 Other specified postprocedural states: Secondary | ICD-10-CM

## 2011-01-03 DIAGNOSIS — I4949 Other premature depolarization: Secondary | ICD-10-CM

## 2011-01-03 NOTE — Patient Instructions (Signed)
Continue your current medications.  Watch your blood pressure periodically.  We will see you back in 4 months.

## 2011-01-03 NOTE — Assessment & Plan Note (Signed)
His LV function by echo is normal. He is asymptomatic. His prior TSH was normal. BNP level was only 64. No further therapy required at this time.

## 2011-01-03 NOTE — Assessment & Plan Note (Signed)
Recent echocardiogram showed an excellent response with no significant mitral insufficiency and a good repair. The official echocardiogram indicated severe left ventricular enlargement but I think this was a typo. He has only mild left ventricular enlargement with normal systolic function.

## 2011-01-03 NOTE — Progress Notes (Signed)
   Daphene Calamity Date of Birth: 08-19-1929   History of Present Illness: Mr. Clayson is seen today for followup. We had previously stopped his Tekturna due to concerns over interaction with his Diovan. He has been monitoring his blood pressure closely at home and typically gets systolic readings between 130 and 145. His diastolic readings have been normal. He feels well. He has had no significant chest or back pain. His cough has resolved. He denies any shortness of breath.  Current Outpatient Prescriptions on File Prior to Visit  Medication Sig Dispense Refill  . amLODipine (NORVASC) 5 MG tablet Take 5 mg by mouth daily.        Marland Kitchen aspirin 325 MG EC tablet Take 325 mg by mouth daily.        . Cholecalciferol (VITAMIN D) 1000 UNITS capsule Take 1,000 Units by mouth daily.        Marland Kitchen dutasteride (AVODART) 0.5 MG capsule Take 0.5 mg by mouth daily.        . multivitamin (THERAGRAN) per tablet Take 1 tablet by mouth daily.        . rosuvastatin (CRESTOR) 20 MG tablet Take 20 mg by mouth daily.        . Tamsulosin HCl (FLOMAX) 0.4 MG CAPS Take 0.4 mg by mouth daily.        . valsartan-hydrochlorothiazide (DIOVAN-HCT) 320-12.5 MG per tablet Take 1 tablet by mouth daily.        Marland Kitchen DISCONTD: nebivolol (BYSTOLIC) 5 MG tablet Take 5 mg by mouth daily.          No Known Allergies  Past Medical History  Diagnosis Date  . MVP (mitral valve prolapse)     s/p Mitral Valve Repair  . CAD (coronary artery disease)     s/p CABG x 1  . HTN (hypertension)   . Hyperlipidemia   . DJD (degenerative joint disease)   . BPH (benign prostatic hyperplasia)   . Arthritis   . Lumbar disc disease     Past Surgical History  Procedure Date  . Cholecystectomy   . Mitral valve repair January 2002  . Coronary artery bypass graft January 2002    Single bypass to the distal RCA    History  Smoking status  . Never Smoker   Smokeless tobacco  . Never Used    History  Alcohol Use  . 0.0 oz/week  . 1-2 Glasses  of wine per week    rare alcohol use    Family History  Problem Relation Age of Onset  . Stroke Father     Review of Systems: The review of systems is positive for significant arthralgias in his back and hips. This is worse in the early morning and then gets better during the day.  All other systems were reviewed and are negative.  Physical Exam: BP 138/90  Pulse 72  Ht 6' 2"  (1.88 m)  Wt 198 lb 4 oz (89.926 kg)  BMI 25.45 kg/m2 Is a pleasant white male in no acute distress. His HEENT exam is unremarkable. He has no JVD or bruits. Lungs are clear. Cardiac exam reveals a regular rate and rhythm without gallop, murmur, or click. Abdomen is soft and nontender without masses or bruits. He has no significant edema. LABORATORY DATA:   Assessment / Plan:

## 2011-01-03 NOTE — Assessment & Plan Note (Signed)
He is asymptomatic from a coronary standpoint. We'll continue to focus on lifestyle modification and regular exercise.

## 2011-01-03 NOTE — Assessment & Plan Note (Signed)
Blood pressure control at this point appears adequate on his current medication. He will continue with Diovan HCTZ and Norvasc. I will follow up again in 4 months.

## 2011-01-24 ENCOUNTER — Other Ambulatory Visit: Payer: Self-pay | Admitting: *Deleted

## 2011-01-24 DIAGNOSIS — I10 Essential (primary) hypertension: Secondary | ICD-10-CM

## 2011-01-24 MED ORDER — VALSARTAN-HYDROCHLOROTHIAZIDE 320-12.5 MG PO TABS: 1.0000 | ORAL_TABLET | Freq: Every day | ORAL | Status: DC

## 2011-01-24 NOTE — Telephone Encounter (Signed)
Refilled meds per fax request.  

## 2011-02-21 ENCOUNTER — Other Ambulatory Visit: Payer: Self-pay | Admitting: *Deleted

## 2011-02-21 ENCOUNTER — Other Ambulatory Visit: Payer: Self-pay | Admitting: Family Medicine

## 2011-02-21 MED ORDER — AMLODIPINE BESYLATE 5 MG PO TABS: 5.0000 mg | ORAL_TABLET | Freq: Every day | ORAL | Status: DC

## 2011-02-21 NOTE — Telephone Encounter (Signed)
Fax received from pharmacy. Refill completed. Corwin Levins RN

## 2011-04-02 ENCOUNTER — Other Ambulatory Visit: Payer: Self-pay | Admitting: Family Medicine

## 2011-04-08 ENCOUNTER — Encounter: Payer: Self-pay | Admitting: Family Medicine

## 2011-04-09 ENCOUNTER — Ambulatory Visit (INDEPENDENT_AMBULATORY_CARE_PROVIDER_SITE_OTHER): Payer: Medicare Other | Admitting: Family Medicine

## 2011-04-09 ENCOUNTER — Encounter: Payer: Self-pay | Admitting: Family Medicine

## 2011-04-09 VITALS — BP 140/80 | HR 74 | Wt 195.0 lb

## 2011-04-09 DIAGNOSIS — M25559 Pain in unspecified hip: Secondary | ICD-10-CM

## 2011-04-09 DIAGNOSIS — Z23 Encounter for immunization: Secondary | ICD-10-CM

## 2011-04-09 DIAGNOSIS — K4090 Unilateral inguinal hernia, without obstruction or gangrene, not specified as recurrent: Secondary | ICD-10-CM

## 2011-04-09 DIAGNOSIS — S39013A Strain of muscle, fascia and tendon of pelvis, initial encounter: Secondary | ICD-10-CM

## 2011-04-09 NOTE — Progress Notes (Signed)
  Subjective:    Patient ID: Dian Minahan, male    DOB: Mar 08, 1930, 77 y.o.   MRN: [id number]  HPI He is here for consult concerning continued difficulty with his left inguinal hernia. This was diagnosed at sometime in the past and is now getting larger. He also has noted some difficulty with inguinal pain in that area especially with any physical activities. He notes difficulty getting in and out of bed as well as getting up from a chair. He has no history of injury to that area. He also has had some right hip discomfort and is planning to see Dr. Nelva Bush again. He did get relief with epidural injections.   Review of Systems     Objective:   Physical Exam Large left inguinal hernia is noted. He also is tender to palpation over the insertion of the adductor muscles.      Assessment & Plan:  Left inguinal hernia. Left abductor pain, etiology unclear. Right hip pain. General surgery referral will be made. Recommend Aleve for his pain relief. He will followup with Dr. Nelva Bush. If he continues to have difficulty with the abductors, further evaluation of that will be necessary. Flu shot also given

## 2011-04-09 NOTE — Patient Instructions (Signed)
Use Aleve 2 tablets 2 or 3 times per day for your inguinal pain and the neck discomfort.

## 2011-04-10 ENCOUNTER — Encounter (INDEPENDENT_AMBULATORY_CARE_PROVIDER_SITE_OTHER): Payer: Self-pay | Admitting: Surgery

## 2011-04-10 ENCOUNTER — Ambulatory Visit (INDEPENDENT_AMBULATORY_CARE_PROVIDER_SITE_OTHER): Payer: Medicare Other | Admitting: Surgery

## 2011-04-10 VITALS — BP 146/80 | HR 64 | Temp 97.4°F | Ht 74.0 in | Wt 195.8 lb

## 2011-04-10 DIAGNOSIS — M25559 Pain in unspecified hip: Secondary | ICD-10-CM

## 2011-04-10 DIAGNOSIS — M25552 Pain in left hip: Secondary | ICD-10-CM

## 2011-04-10 DIAGNOSIS — K4020 Bilateral inguinal hernia, without obstruction or gangrene, not specified as recurrent: Secondary | ICD-10-CM

## 2011-04-10 DIAGNOSIS — M6200 Separation of muscle (nontraumatic), unspecified site: Secondary | ICD-10-CM

## 2011-04-10 DIAGNOSIS — K432 Incisional hernia without obstruction or gangrene: Secondary | ICD-10-CM

## 2011-04-10 DIAGNOSIS — K4090 Unilateral inguinal hernia, without obstruction or gangrene, not specified as recurrent: Secondary | ICD-10-CM | POA: Insufficient documentation

## 2011-04-10 DIAGNOSIS — M6208 Separation of muscle (nontraumatic), other site: Secondary | ICD-10-CM

## 2011-04-10 NOTE — Progress Notes (Signed)
Subjective:     Patient ID: [name] [name], male   DOB: 08-Jan-1930, 77 y.o.   MRN: [id number]  HPI  This is an active elderly male who's had a left inguinal hernia for some time. It is not been particularly bothersome until recently. He started getting it left lower pelvic/inner thigh pain. He also knows that the groin hernia was getting larger. It concerned him. It bothers him when he tries to lift his left knee up. No fevers or chills or sweats. No nausea or vomiting. He's never had any episodes of incarceration. He also noted some lumps underneath his breast bone at his upper abdomen which has been there for since he had his heart surgery 10 years ago. These do not bother him too much. He's also noticed a wide bulging from his breast bone in his belly button it seems to go away when he stands up.  Due to worsening inner thigh pain and an MRI of the lumbar spine that doesn't seem to be strongly explaining his symptoms, his PCP recommend he see a surgeon to consider evaluation for possible surgery.  Review of Systems  Constitutional: Negative for fever, chills and diaphoresis.  HENT: Negative for nosebleeds, sore throat, facial swelling, mouth sores, trouble swallowing and ear discharge.   Eyes: Negative for photophobia, discharge and visual disturbance.  Respiratory: Negative for choking, chest tightness, shortness of breath and stridor.   Cardiovascular: Negative for chest pain and palpitations.       Walks regularly.  1-2 miles without problems except low back soreness.  Gastrointestinal: Negative for nausea, vomiting, abdominal pain, diarrhea, constipation, blood in stool, abdominal distention, anal bleeding and rectal pain.       BM daily  Genitourinary: Positive for difficulty urinating. Negative for dysuria, urgency, penile swelling, enuresis, penile pain and testicular pain.       Left inner groin pain - Aleve helps only a little  Musculoskeletal: Positive for back pain and arthralgias.  Negative for myalgias and gait problem.  Skin: Negative for color change, pallor, rash and wound.  Neurological: Negative for dizziness, speech difficulty, weakness, numbness and headaches.  Hematological: Negative for adenopathy. Does not bruise/bleed easily.  Psychiatric/Behavioral: Negative for hallucinations, confusion and agitation.       Objective:   Physical Exam  Constitutional: He is oriented to person, place, and time. He appears well-developed and well-nourished. No distress.  HENT:  Head: Normocephalic.  Mouth/Throat: Oropharynx is clear and moist. No oropharyngeal exudate.  Eyes: Conjunctivae and EOM are normal. Pupils are equal, round, and reactive to light. No scleral icterus.  Neck: Normal range of motion. Neck supple. No tracheal deviation present.  Cardiovascular: Normal rate, regular rhythm and intact distal pulses.   Pulmonary/Chest: Effort normal and breath sounds normal. No respiratory distress.  Abdominal: Soft. He exhibits no distension. There is no tenderness.         Moderate diastasis recti.  No umb hernia  Genitourinary: Penis normal.    No penile tenderness.     Musculoskeletal: Normal range of motion. He exhibits no tenderness.       Soreness with left hip flexion  Lymphadenopathy:    He has no cervical adenopathy.       Right: No inguinal adenopathy present.       Left: No inguinal adenopathy present.  Neurological: He is alert and oriented to person, place, and time. No cranial nerve deficit. He exhibits normal muscle tone. Coordination normal.  Skin: Skin is warm and dry. No rash  noted. He is not diaphoretic. No erythema. No pallor.  Psychiatric: He has a normal mood and affect. His behavior is normal. Judgment and thought content normal.       Assessment:     Numerous hernias: Epigastric ventral incisional herniae x2 in the setting of diastasis recti, large left inguinal hernia, a small right inguinal hernia.  Left deep groin and inner  thigh pain suspicious for an obturator hernia.    Plan:     At this point, I think he would benefit from having at least the left large left inguinal hernia repair. I think a laparoscopic approach would be helpful to see if he has an ipsilateral femoral or obturator hernia that could explain his symptoms. I would have a low threshold to fix the right side as well as he is usually pretty functional and I do not want to put a situation of repeat operations. These are a higher risk for incarceration.  I offered to repair the epigastric hernias as well. He didn't feel strongly about that at this time. Given their location, I do not think the risks of incarceration are too high for anything life-threatening.  I did discuss about getting a CAT scan of the abdomen and pelvis especially to make sure that we're not missing anything to explain his left pelvic pain. Our noted it's reasonable to start with surgery first to make sure I missed anything and proceed with the CT next. He did have an MRI of the lumbar spine which did not delineate major neuro or structural defects. He wishes to go ahead and proceed with surgery first in the groins only.    Cardiac clearance.  He is due to see Dr. Martinique soon anyway.  Good exercise tolerance bodes well for minimizing complications.  The anatomy & physiology of the abdominal wall was discussed.  The pathophysiology of hernias was discussed.  Natural history risks without surgery of enlargement, pain, incarceration & strangulation was discussed.   Contributors to complications such as smoking, obesity, diabetes, prior surgery, etc were discussed.  I feel the risks of no intervention will lead to serious problems that outweigh the operative risks; therefore, I recommended surgery to reduce and repair the hernia.  I explained laparoscopic techniques with possible need for an open approach.  I noted the probable use of mesh to patch and/or buttress hernia repair  Risks such  as bleeding, infection, abscess, need for further treatment, heart attack, death, and other risks were discussed.  Goals of post-operative recovery were discussed as well.  Possibility that this will not correct all symptoms was explained.  I stressed the importance of low-impact activity, aggressive pain control, avoiding constipation, & not pushing through pain to minimize risk of post-operative chronic pain or injury. Possibility of reherniation was discussed.  We will work to minimize complications.   An educational handout further explaining the pathology & treatment options was given as well.  Questions were answered.  The patient expresses understanding & wishes to proceed with surgery.

## 2011-04-10 NOTE — Patient Instructions (Signed)
See hernia surgery sheet

## 2011-04-15 ENCOUNTER — Telehealth (INDEPENDENT_AMBULATORY_CARE_PROVIDER_SITE_OTHER): Payer: Self-pay

## 2011-04-15 NOTE — Telephone Encounter (Signed)
Called pt to notify him that we received cardiac clearance from Dr Peter Martinique and attatched to surgical orders. Info was taken to surgery schedulers to get scheduled.[facility]

## 2011-04-30 ENCOUNTER — Telehealth: Payer: Self-pay | Admitting: Cardiology

## 2011-04-30 NOTE — Telephone Encounter (Signed)
Faxed documents today.

## 2011-04-30 NOTE — Telephone Encounter (Signed)
Send most recent medical records/tests.

## 2011-05-05 DIAGNOSIS — K4020 Bilateral inguinal hernia, without obstruction or gangrene, not specified as recurrent: Secondary | ICD-10-CM

## 2011-05-05 HISTORY — PX: HERNIA REPAIR: SHX51

## 2011-05-06 ENCOUNTER — Ambulatory Visit: Payer: Medicare Other | Admitting: Cardiology

## 2011-05-08 ENCOUNTER — Telehealth (INDEPENDENT_AMBULATORY_CARE_PROVIDER_SITE_OTHER): Payer: Self-pay

## 2011-05-08 NOTE — Telephone Encounter (Signed)
Called pt back after pt called in earlier this am with complaints of not having a BM since sx on 05-05-11 for lap. Ing.hernia repair. The pt has been taking Milk of Magnesia and fleets enemas daily with not really a good BM. The pt has been getting a lot of gas and some liquid stools but not a formed BM. I spoke to Dr Johney Maine about the pt's condition and he recommended that the pt start on Miralax BID, lots of liquids, bland diet ,and walking 1hr a day. The pt understand the advice and will switch to Miralax./ [facility]HS

## 2011-05-09 ENCOUNTER — Other Ambulatory Visit: Payer: Self-pay | Admitting: Family Medicine

## 2011-05-20 ENCOUNTER — Other Ambulatory Visit: Payer: Self-pay | Admitting: Dermatology

## 2011-05-21 ENCOUNTER — Encounter (INDEPENDENT_AMBULATORY_CARE_PROVIDER_SITE_OTHER): Payer: Self-pay | Admitting: Surgery

## 2011-05-21 ENCOUNTER — Ambulatory Visit (INDEPENDENT_AMBULATORY_CARE_PROVIDER_SITE_OTHER): Payer: Medicare Other | Admitting: Surgery

## 2011-05-21 VITALS — BP 132/76 | HR 60 | Temp 97.8°F | Resp 14 | Ht 74.0 in | Wt 194.4 lb

## 2011-05-21 DIAGNOSIS — K458 Other specified abdominal hernia without obstruction or gangrene: Secondary | ICD-10-CM

## 2011-05-21 DIAGNOSIS — K4190 Unilateral femoral hernia, without obstruction or gangrene, not specified as recurrent: Secondary | ICD-10-CM

## 2011-05-21 DIAGNOSIS — K4090 Unilateral inguinal hernia, without obstruction or gangrene, not specified as recurrent: Secondary | ICD-10-CM

## 2011-05-21 NOTE — Progress Notes (Signed)
Subjective:     Patient ID: [name] [name], male   DOB: 1930-02-13, 77 y.o.   MRN: [id number]  HPI  Patient Care Team: Wyatt Haste, MD as PCP - General Cindee Salt as Consulting Physician (Physical Medicine and Rehabilitation) Peter Martinique, MD as Consulting Physician (Cardiology) [name], MD as Consulting Physician (Gastroenterology)  This patient is a 77 y.o.male who presents today for surgical evaluation.   Procedure: Laparoscopic left inguinal and obturator &  right femoral hernia repairs. 05/05/2011.  Patient comes in today feeling well overall. He is concerned about a lump in his left groin. Not particularly painful. He was worried the hernia may have come back. Urinating well. He started with a bad constipation on the oxycodone. Since he's been off at it's been easier to manage but is still not back to his normal morning BM. He's back to driving. He is walking on the treadmill and going back to the [facility] for exercise. Denies ecchymosis. He still gets some pains in his left lower inner thigh. However, the attacks are less intense and less often. He had numerous questions and some concerns; however, he feels like he is getting better.  Past Medical History  Diagnosis Date  . MVP (mitral valve prolapse)     s/p Mitral Valve Repair  . CAD (coronary artery disease)     s/p CABG x 1  . HTN (hypertension)   . Hyperlipidemia   . DJD (degenerative joint disease)   . BPH (benign prostatic hyperplasia)   . Arthritis   . Lumbar disc disease   . Heart murmur     Past Surgical History  Procedure Date  . Mitral valve repair January 2002  . Coronary artery bypass graft January 2002    Single bypass to the distal RCA  . Colonoscopy ~2008    no polyps per pt.  Dr. [name]  . Cholecystectomy     Dr. Lennie Hummer  . Hernia repair 05/05/11    Lap L inguinal & obturator herniae, R Femoral Hernia    History   Social History  . Marital Status: Married    Spouse  Name: N/A    Number of Children: 2  . Years of Education: N/A   Occupational History  . Retired     Scientist, research (physical sciences)  .     Social History Main Topics  . Smoking status: Never Smoker   . Smokeless tobacco: Never Used  . Alcohol Use: 0.0 oz/week    1-2 Glasses of wine per week     rare alcohol use  . Drug Use: No  . Sexually Active:    Other Topics Concern  . Not on file   Social History Narrative  . No narrative on file    Family History  Problem Relation Age of Onset  . Stroke Father   . Stroke Mother     TIA    Current outpatient prescriptions:acetaminophen (TYLENOL) 325 MG tablet, Take 650 mg by mouth every 6 (six) hours as needed.  , Disp: , Rfl: ;  amLODipine (NORVASC) 5 MG tablet, Take 1 tablet (5 mg total) by mouth daily., Disp: 30 tablet, Rfl: 5;  aspirin 325 MG EC tablet, Take 325 mg by mouth daily.  , Disp: , Rfl: ;  Cholecalciferol (VITAMIN D) 1000 UNITS capsule, Take 1,000 Units by mouth daily.  , Disp: , Rfl:  CRESTOR 20 MG tablet, take 1 tablet by mouth once daily, Disp: 30 tablet, Rfl:  0;  dutasteride (AVODART) 0.5 MG capsule, Take 0.5 mg by mouth daily.  , Disp: , Rfl: ;  Glucosamine-Chondroitin (COSAMIN DS PO), Take by mouth daily.  , Disp: , Rfl: ;  ibuprofen (ADVIL,MOTRIN) 200 MG tablet, Take 200 mg by mouth every 6 (six) hours as needed.  , Disp: , Rfl: ;  multivitamin (THERAGRAN) per tablet, Take 1 tablet by mouth daily.  , Disp: , Rfl:  Tamsulosin HCl (FLOMAX) 0.4 MG CAPS, Take 0.4 mg by mouth daily.  , Disp: , Rfl: ;  valsartan-hydrochlorothiazide (DIOVAN-HCT) 320-12.5 MG per tablet, Take 1 tablet by mouth daily., Disp: 30 tablet, Rfl: 5  No Known Allergies     Review of Systems  Constitutional: Negative for fever, chills and diaphoresis.  HENT: Negative for nosebleeds, sore throat, facial swelling, mouth sores, trouble swallowing and ear discharge.   Eyes: Negative for photophobia, discharge and visual disturbance.  Respiratory: Negative for  choking, chest tightness, shortness of breath and stridor.   Cardiovascular: Negative for chest pain, palpitations and leg swelling.  Gastrointestinal: Negative for nausea, vomiting, abdominal pain, diarrhea, constipation, blood in stool, abdominal distention, anal bleeding and rectal pain.  Genitourinary: Positive for testicular pain. Negative for dysuria, urgency, decreased urine volume, discharge, penile swelling, scrotal swelling, enuresis, difficulty urinating and penile pain.  Musculoskeletal: Positive for back pain and arthralgias. Negative for myalgias, joint swelling and gait problem.  Skin: Negative for color change, pallor, rash and wound.  Neurological: Negative for dizziness, speech difficulty, weakness, numbness and headaches.  Hematological: Negative for adenopathy. Does not bruise/bleed easily.  Psychiatric/Behavioral: Negative for hallucinations, confusion and agitation.       Objective:   Physical Exam  Constitutional: He is oriented to person, place, and time. He appears well-developed and well-nourished. No distress.  HENT:  Head: Normocephalic.  Mouth/Throat: Oropharynx is clear and moist. No oropharyngeal exudate.  Eyes: Conjunctivae and EOM are normal. Pupils are equal, round, and reactive to light. No scleral icterus.  Neck: Normal range of motion. No tracheal deviation present.  Cardiovascular: Normal rate, normal heart sounds and intact distal pulses.   Pulmonary/Chest: Effort normal. No respiratory distress.  Abdominal: Soft. He exhibits no distension. There is no tenderness. Hernia confirmed negative in the right inguinal area and confirmed negative in the left inguinal area.       Incisions clean with normal healing ridges.  No hernias  Genitourinary: Penis normal. No penile tenderness.       5x5cm seroma L groin.  Nontender, no cellulitis.  Left inner perineal pain decreased.  No ecchymosis  Musculoskeletal: Normal range of motion. He exhibits no tenderness.    Neurological: He is alert and oriented to person, place, and time. No cranial nerve deficit. He exhibits normal muscle tone. Coordination normal.  Skin: Skin is warm and dry. No rash noted. He is not diaphoretic.  Psychiatric: He has a normal mood and affect. His behavior is normal.       Assessment:      Elderly but active male status post hernia repairs in 3 locations, POD# 16. Recovering relatively well for only 2 weeks out    Plan:     I noted I think he is doing quite well considering his issues. I tried to reassure him that the lump will gradually shrink down over the next 2 months. Occasionally some people get a small persistent lump that I could excise later but for the most part it should gradually improve. He seems mostly reassured.  Increase activity  as tolerated.  Add fiber to diet. Alternating constipation/diarrhea should resolve. Agree with use of nonsteroidals and heat to help manage pain over narcotics.  I am hopeful that the sharp deep left pelvic pains are decreasing are a sign that the obturator hernia was the source of that problem. I would give it a few more months before getting a sense of what his new baseline will be.  I offered to see him in about 3 weeks to make sure he is continuing to improve. However, he feels controlled as calling me if things are not improving. Return to clinic p.r.n.

## 2011-05-29 ENCOUNTER — Ambulatory Visit (INDEPENDENT_AMBULATORY_CARE_PROVIDER_SITE_OTHER): Payer: Medicare Other | Admitting: Cardiology

## 2011-05-29 ENCOUNTER — Encounter: Payer: Self-pay | Admitting: Cardiology

## 2011-05-29 VITALS — BP 142/90 | HR 66 | Ht 74.0 in | Wt 196.0 lb

## 2011-05-29 DIAGNOSIS — Z9889 Other specified postprocedural states: Secondary | ICD-10-CM

## 2011-05-29 DIAGNOSIS — E785 Hyperlipidemia, unspecified: Secondary | ICD-10-CM

## 2011-05-29 DIAGNOSIS — I2510 Atherosclerotic heart disease of native coronary artery without angina pectoris: Secondary | ICD-10-CM

## 2011-05-29 DIAGNOSIS — I10 Essential (primary) hypertension: Secondary | ICD-10-CM

## 2011-05-29 NOTE — Patient Instructions (Signed)
Continue your current medications.  Keep your feet elevated when possible. If your swelling gets worse let me know.  I will see you again in  6 months.

## 2011-05-30 NOTE — Assessment & Plan Note (Signed)
His echocardiogram in April of this year showed satisfactory repair. His exam is stable.

## 2011-05-30 NOTE — Assessment & Plan Note (Addendum)
Blood pressure control is acceptable. I recommended sodium restriction. Since he does have mild edema I recommend that he keep his feet elevated when possible.

## 2011-05-30 NOTE — Progress Notes (Signed)
[name] [name] Date of Birth: 25-Aug-1929   History of Present Illness: [name] [name] is seen today for followup. He reports he has been doing well from a cardiac standpoint. His [name] pressure readings have been good. He did recently undergo "quadruple" hernia repair on the left about 4 weeks ago. He still has some soreness in his left groin region. He denies any chest pain or shortness of breath. He's had no palpitations.  Current Outpatient Prescriptions on File Prior to Visit  Medication Sig Dispense Refill  . acetaminophen (TYLENOL) 325 MG tablet Take 650 mg by mouth every 6 (six) hours as needed.        Marland Kitchen amLODipine (NORVASC) 5 MG tablet Take 1 tablet (5 mg total) by mouth daily.  30 tablet  5  . aspirin 325 MG EC tablet Take 325 mg by mouth daily.        . Cholecalciferol (VITAMIN D) 1000 UNITS capsule Take 1,000 Units by mouth daily.        . CRESTOR 20 MG tablet take 1 tablet by mouth once daily  30 tablet  0  . dutasteride (AVODART) 0.5 MG capsule Take 0.5 mg by mouth daily.        . Glucosamine-Chondroitin (COSAMIN DS PO) Take by mouth daily.        Marland Kitchen ibuprofen (ADVIL,MOTRIN) 200 MG tablet Take 200 mg by mouth every 6 (six) hours as needed.        . multivitamin (THERAGRAN) per tablet Take 1 tablet by mouth daily.        . Tamsulosin HCl (FLOMAX) 0.4 MG CAPS Take 0.4 mg by mouth daily.        . valsartan-hydrochlorothiazide (DIOVAN-HCT) 320-12.5 MG per tablet Take 1 tablet by mouth daily.  30 tablet  5    No Known Allergies  Past Medical History  Diagnosis Date  . MVP (mitral valve prolapse)     s/p Mitral Valve Repair  . CAD (coronary artery disease)     s/p CABG x 1  . HTN (hypertension)   . Hyperlipidemia   . DJD (degenerative joint disease)   . BPH (benign prostatic hyperplasia)   . Arthritis   . Lumbar disc disease     Past Surgical History  Procedure Date  . Mitral valve repair January 2002  . Coronary artery bypass graft January 2002    Single bypass to the  distal RCA  . Colonoscopy ~2008    no polyps per pt.  Dr. [name]  . Cholecystectomy     Dr. Lennie Hummer  . Hernia repair 05/05/11    Lap L inguinal & obturator herniae, R Femoral Hernia    History  Smoking status  . Never Smoker   Smokeless tobacco  . Never Used    History  Alcohol Use  . 0.0 oz/week  . 1-2 Glasses of wine per week    rare alcohol use    Family History  Problem Relation Age of Onset  . Stroke Father   . Stroke Mother     TIA    Review of Systems: The review of systems is positive for significant arthralgias in his back and hips. He has noted some soft swelling in his ankles since his surgery. All other systems were reviewed and are negative.  Physical Exam: BP 142/90  Pulse 66  Ht 6' 2"  (1.88 m)  Wt 196 lb (88.905 kg)  BMI 25.16 kg/m2 Is a pleasant white male in no  acute distress. His HEENT exam is unremarkable. He has no JVD or bruits. Lungs are clear. Cardiac exam reveals a regular rate and rhythm without gallop, murmur, or click. Abdomen is soft and nontender without masses or bruits. He has 1+ ankle edema LABORATORY DATA:   Assessment / Plan:

## 2011-05-30 NOTE — Assessment & Plan Note (Signed)
He is having no significant anginal symptoms.

## 2011-06-03 ENCOUNTER — Other Ambulatory Visit: Payer: Self-pay | Admitting: Family Medicine

## 2011-06-03 MED ORDER — ROSUVASTATIN CALCIUM 20 MG PO TABS: 20.0000 mg | ORAL_TABLET | Freq: Every day | ORAL | Status: DC

## 2011-06-03 NOTE — Telephone Encounter (Signed)
Pt needs refill Crestor. Pt states he has had to call monthly for last couple of months to get crestor refilled. Pt's last lipid panel was 07/16/10 , he will make another appointment for this December. Patient would like multiple refills so he does not have to call every month

## 2011-06-04 ENCOUNTER — Telehealth: Payer: Self-pay | Admitting: Family Medicine

## 2011-06-04 NOTE — Telephone Encounter (Signed)
This prescription was called in with a few refills

## 2011-06-04 NOTE — Telephone Encounter (Signed)
Pt notified that he had crestor refilled with 3 refills.

## 2011-07-26 ENCOUNTER — Other Ambulatory Visit: Payer: Self-pay | Admitting: Cardiology

## 2011-08-21 ENCOUNTER — Ambulatory Visit (INDEPENDENT_AMBULATORY_CARE_PROVIDER_SITE_OTHER): Payer: Medicare Other | Admitting: Family Medicine

## 2011-08-21 ENCOUNTER — Encounter: Payer: Self-pay | Admitting: Family Medicine

## 2011-08-21 VITALS — BP 150/90 | HR 63 | Temp 98.8°F | Ht 74.0 in | Wt 195.0 lb

## 2011-08-21 DIAGNOSIS — J069 Acute upper respiratory infection, unspecified: Secondary | ICD-10-CM

## 2011-08-21 DIAGNOSIS — M129 Arthropathy, unspecified: Secondary | ICD-10-CM

## 2011-08-21 DIAGNOSIS — M1990 Unspecified osteoarthritis, unspecified site: Secondary | ICD-10-CM

## 2011-08-21 NOTE — Progress Notes (Signed)
  Subjective:    Patient ID: [name] [name], male    DOB: Sep 27, 1929, 78 y.o.   MRN: [id number]  HPI He has a five-day history this started with sore throat followed by rhinorrhea, PND, coughing and chest congestion as well as nasal congestion, headache but no fever, chills, shortness of breath or DOE. He does not smoke. He also continues to have difficulty with back and hip pain as well as intermittent perineal discomfort. These symptoms tend to occur more with physical activities. He has been evaluated in the past by Dr. Nelva Bush.   Review of Systems     Objective:   Physical Exam alert and in no distress. Tympanic membranes and canals are normal. Throat is clear. Tonsils are normal. Neck is supple without adenopathy or thyromegaly. Cardiac exam shows a regular sinus rhythm without murmurs or gallops. Lungs are clear to auscultation.       Assessment & Plan:   1. Acute URI   2. Arthritis    supportive care for the ride. He is to call me if his symptoms worsen over the weekend. Discussed the arthritis symptoms with him and at this point no further intervention will be needed.

## 2011-08-21 NOTE — Patient Instructions (Signed)
You can use Afrin nasal spray at night. He is here coughing congestion get worse over the weekend call me

## 2011-08-23 ENCOUNTER — Other Ambulatory Visit: Payer: Self-pay | Admitting: Cardiology

## 2011-09-20 ENCOUNTER — Other Ambulatory Visit: Payer: Self-pay | Admitting: Family Medicine

## 2011-09-30 ENCOUNTER — Encounter: Payer: Self-pay | Admitting: Family Medicine

## 2011-09-30 ENCOUNTER — Ambulatory Visit (INDEPENDENT_AMBULATORY_CARE_PROVIDER_SITE_OTHER): Payer: Medicare Other | Admitting: Family Medicine

## 2011-09-30 DIAGNOSIS — M1990 Unspecified osteoarthritis, unspecified site: Secondary | ICD-10-CM | POA: Insufficient documentation

## 2011-09-30 DIAGNOSIS — E785 Hyperlipidemia, unspecified: Secondary | ICD-10-CM

## 2011-09-30 DIAGNOSIS — I2510 Atherosclerotic heart disease of native coronary artery without angina pectoris: Secondary | ICD-10-CM

## 2011-09-30 DIAGNOSIS — I10 Essential (primary) hypertension: Secondary | ICD-10-CM

## 2011-09-30 DIAGNOSIS — M129 Arthropathy, unspecified: Secondary | ICD-10-CM

## 2011-09-30 DIAGNOSIS — Z79899 Other long term (current) drug therapy: Secondary | ICD-10-CM

## 2011-09-30 NOTE — Progress Notes (Signed)
  Subjective:    Patient ID: [name] [name], male    DOB: [birth year], 78 y.o.   MRN: [id number]  HPI He is here for a medication check. He continues to have difficulty with back and hip pain. This usually bothers him when he lies on either side while sleeping. He has been treating himself with either Tylenol or Naprosyn over-the-counter with minimal relief of his symptoms. He states that the pain usually goes away or in the day and apparently is not made worse with physical activity. He does continue to work out and apparently has no troubles when he does this. He also has had intermittent lateral rib pain that tends to go from one side to the other. He states that the area is tender to palpation but then will go away. He cannot relate this to any particular position. He continues to be followed by cardiology. He also continues on his BPH medications. He still does get up 2 or 3 times per night usually blames this on his hip pain. He has had epidurals in the past and wonders whether this will help. His medications were reviewed. He has no particular concerns or questions regarding them.  Review of Systems     Objective:   Physical Exam Alert and in no distress otherwise not examined       Assessment & Plan:   1. HTN (hypertension)  CBC with Differential, Comprehensive metabolic panel, Lipid panel  2. CAD (coronary artery disease)  CBC with Differential, Comprehensive metabolic panel, Lipid panel  3. Arthritis    4. Encounter for long-term (current) use of other medications  CBC with Differential, Comprehensive metabolic panel, Lipid panel  5. Hyperlipidemia LDL goal < 70  Lipid panel   one half hour spent discussing all these issues with him. I will do routine blood screening. Encouraged him to remain physically active. Recommend 2 Tylenol and 2 Aleve at bedtime and if this does not help, he is to call me.

## 2011-09-30 NOTE — Patient Instructions (Signed)
Take 2 Tylenol and 2 Naprosyn when you go to bed and let me know how works for your pain

## 2011-10-01 LAB — CBC WITH DIFFERENTIAL/PLATELET
Basophils Absolute: 0 10*3/uL (ref 0.0–0.1)
Basophils Relative: 0 % (ref 0–1)
Eosinophils Absolute: 0.2 10*3/uL (ref 0.0–0.7)
MCH: 29.7 pg (ref 26.0–34.0)
MCHC: 32.3 g/dL (ref 30.0–36.0)
Monocytes Relative: 8 % (ref 3–12)
Neutro Abs: 4.8 10*3/uL (ref 1.7–7.7)
Neutrophils Relative %: 69 % (ref 43–77)
Platelets: 207 10*3/uL (ref 150–400)
RDW: 14.1 % (ref 11.5–15.5)

## 2011-10-01 LAB — COMPREHENSIVE METABOLIC PANEL
AST: 23 U/L (ref 0–37)
Alkaline Phosphatase: 84 U/L (ref 39–117)
Glucose, Bld: 90 mg/dL (ref 70–99)
Potassium: 3.9 mEq/L (ref 3.5–5.3)
Sodium: 142 mEq/L (ref 135–145)
Total Bilirubin: 1 mg/dL (ref 0.3–1.2)
Total Protein: 6.8 g/dL (ref 6.0–8.3)

## 2011-10-01 LAB — LIPID PANEL
HDL: 36 mg/dL — ABNORMAL LOW (ref >39)
LDL Cholesterol: 62 mg/dL (ref 0–99)
Triglycerides: 74 mg/dL (ref <150)
VLDL: 15 mg/dL (ref 0–40)

## 2011-10-30 ENCOUNTER — Other Ambulatory Visit: Payer: Self-pay | Admitting: Family Medicine

## 2011-11-25 ENCOUNTER — Ambulatory Visit (INDEPENDENT_AMBULATORY_CARE_PROVIDER_SITE_OTHER): Payer: Medicare Other | Admitting: Cardiology

## 2011-11-25 ENCOUNTER — Encounter: Payer: Self-pay | Admitting: Cardiology

## 2011-11-25 VITALS — BP 142/68 | HR 81 | Ht 74.0 in | Wt 196.0 lb

## 2011-11-25 DIAGNOSIS — I2510 Atherosclerotic heart disease of native coronary artery without angina pectoris: Secondary | ICD-10-CM

## 2011-11-25 DIAGNOSIS — Z9889 Other specified postprocedural states: Secondary | ICD-10-CM

## 2011-11-25 DIAGNOSIS — I10 Essential (primary) hypertension: Secondary | ICD-10-CM

## 2011-11-25 DIAGNOSIS — R002 Palpitations: Secondary | ICD-10-CM

## 2011-11-25 DIAGNOSIS — Q438 Other specified congenital malformations of intestine: Secondary | ICD-10-CM

## 2011-11-25 DIAGNOSIS — R42 Dizziness and giddiness: Secondary | ICD-10-CM

## 2011-11-25 NOTE — Patient Instructions (Signed)
Continue your current medication  Avoid caffeine and decongestants.  I will see you again in 6 months.

## 2011-11-25 NOTE — Progress Notes (Signed)
[name] [name] Date of Birth: [birth year]   History of Present Illness: [name] [name] is seen today for followup. He reports that his [name] pressures at home have been running from 263 to 785 systolic. He does complain of symptoms of lightheadedness. This usually occurs when he is at rest and is an active. It is better when he increases his activity. He still exercises in the gym 3 days a week. He denies any true vertigo symptoms. He's had no chest pain or shortness of breath.  Current Outpatient Prescriptions on File Prior to Visit  Medication Sig Dispense Refill  . acetaminophen (TYLENOL) 325 MG tablet Take 650 mg by mouth every 6 (six) hours as needed.        Marland Kitchen amLODipine (NORVASC) 5 MG tablet take 1 tablet by mouth once daily  30 tablet  5  . aspirin 325 MG EC tablet Take 325 mg by mouth daily.        . Cholecalciferol (VITAMIN D) 1000 UNITS capsule Take 1,000 Units by mouth daily.        . CRESTOR 20 MG tablet take 1 tablet by mouth once daily  30 tablet  5  . dutasteride (AVODART) 0.5 MG capsule Take 0.5 mg by mouth daily.        . Glucosamine-Chondroitin (COSAMIN DS PO) Take by mouth daily.        Marland Kitchen ibuprofen (ADVIL,MOTRIN) 200 MG tablet Take 200 mg by mouth every 6 (six) hours as needed.        . multivitamin (THERAGRAN) per tablet Take 1 tablet by mouth daily.        . Tamsulosin HCl (FLOMAX) 0.4 MG CAPS Take 0.4 mg by mouth daily.        . valsartan-hydrochlorothiazide (DIOVAN-HCT) 320-12.5 MG per tablet take 1 tablet by mouth once daily  30 tablet  5    No Known Allergies  Past Medical History  Diagnosis Date  . MVP (mitral valve prolapse)     s/p Mitral Valve Repair  . CAD (coronary artery disease)     s/p CABG x 1  . HTN (hypertension)   . Hyperlipidemia   . DJD (degenerative joint disease)   . BPH (benign prostatic hyperplasia)   . Arthritis   . Lumbar disc disease     Past Surgical History  Procedure Date  . Mitral valve repair January 2002  . Coronary artery  bypass graft January 2002    Single bypass to the distal RCA  . Colonoscopy ~2008    no polyps per pt.  Dr. [name]  . Cholecystectomy     Dr. Lennie Hummer  . Hernia repair 05/05/11    Lap L inguinal & obturator herniae, R Femoral Hernia    History  Smoking status  . Never Smoker   Smokeless tobacco  . Never Used    History  Alcohol Use  . 0.0 oz/week  . 1-2 Glasses of wine per week    rare alcohol use    Family History  Problem Relation Age of Onset  . Stroke Father   . Stroke Mother     TIA    Review of Systems: As noted in history of present illness. All other systems were reviewed and are negative.  Physical Exam: BP 142/68  Pulse 81  Ht 6' 2"  (1.88 m)  Wt 196 lb (88.905 kg)  BMI 25.16 kg/m2 He is a pleasant white male in no acute distress. His HEENT exam is  unremarkable. He has no JVD or bruits. Lungs are clear. Cardiac exam reveals a regular rate and rhythm without gallop, murmur, or click. Abdomen is soft and nontender without masses or bruits. He has trace ankle edema LABORATORY DATA: ECG today demonstrates normal sinus rhythm with first-degree AV block and occasional PVCs and PACs. Otherwise demonstrates nonspecific T-wave abnormality.  Assessment / Plan:

## 2011-11-25 NOTE — Assessment & Plan Note (Signed)
He has had an excellent response to valve repair.

## 2011-11-25 NOTE — Assessment & Plan Note (Signed)
I suspect that his lightheadedness is related to his ectopy. He has both PACs and PVCs. He is non-hypotensive and orthostatic checked by myself was unremarkable. I think that his ectopy has not is well perfused as his normal beats. This is why it improves when he increases his heart rate. I think data blocker therapy may actually make this worse. I recommended no specific treatment at this time. If his symptoms should worsen we could give him a trial of beta blocker therapy to see if this would help.

## 2011-11-25 NOTE — Assessment & Plan Note (Signed)
He remains asymptomatic. We will continue risk factor modification and aspirin therapy.

## 2011-12-30 ENCOUNTER — Encounter: Payer: Self-pay | Admitting: Family Medicine

## 2011-12-30 ENCOUNTER — Ambulatory Visit (INDEPENDENT_AMBULATORY_CARE_PROVIDER_SITE_OTHER): Payer: Medicare Other | Admitting: Family Medicine

## 2011-12-30 VITALS — BP 130/70 | HR 68 | Temp 98.5°F | Wt 193.0 lb

## 2011-12-30 DIAGNOSIS — J209 Acute bronchitis, unspecified: Secondary | ICD-10-CM

## 2011-12-30 DIAGNOSIS — H6120 Impacted cerumen, unspecified ear: Secondary | ICD-10-CM

## 2011-12-30 DIAGNOSIS — H6122 Impacted cerumen, left ear: Secondary | ICD-10-CM

## 2011-12-30 MED ORDER — AMOXICILLIN-POT CLAVULANATE 875-125 MG PO TABS: 1.0000 | ORAL_TABLET | Freq: Two times a day (BID) | ORAL | Status: AC

## 2011-12-30 NOTE — Progress Notes (Signed)
  Subjective:    Patient ID: [name] [name], male    DOB: 01/05/1930, 78 y.o.   MRN: [id number]  HPI Approximately one week ago he had difficulty with rhinorrhea and sneezing followed by chest congestion, hoarse voice pleuritic cough that is nonproductive. No fever, chills, earache, sore throat or sinus congestion. He continues on his nasal steroid spray for seasonal allergies. Does not smoke. He does have a previous history of difficulty with bronchitis and chronic cough. He also said some difficulty with intermittent right posterior rib pain that tends to come and go especially with movement. It goes away fairly quickly. Review of Systems     Objective:   Physical Exam alert and in no distress. Tympanic membrane and canal on the right is normal. Left canal has cerumen.  Throat is clear. Tonsils are normal. Neck is supple without adenopathy or thyromegaly. Cardiac exam shows a regular sinus rhythm without murmurs or gallops. Lungs are clear to auscultation. Ear lavage was attempted however he experienced excruciating pain in the ear. Reevaluation of the ear showed cerumen still present and no evidence of,.        Assessment & Plan:   1. Cerumen impaction, left    2. Bronchitis, acute  amoxicillin-clavulanate (AUGMENTIN) 875-125 MG per tablet   he was referred to ENT for further evaluation of this pain since it is unclear why he had this much pain with minimal lavage. No particular intervention at this point for the rib discomfort. We did discuss x-rays and possible referral.

## 2012-01-24 ENCOUNTER — Other Ambulatory Visit: Payer: Self-pay | Admitting: Cardiology

## 2012-02-26 ENCOUNTER — Other Ambulatory Visit: Payer: Self-pay | Admitting: Cardiology

## 2012-04-25 ENCOUNTER — Other Ambulatory Visit: Payer: Self-pay | Admitting: Family Medicine

## 2012-05-24 ENCOUNTER — Other Ambulatory Visit: Payer: Self-pay | Admitting: Dermatology

## 2012-06-04 ENCOUNTER — Ambulatory Visit (INDEPENDENT_AMBULATORY_CARE_PROVIDER_SITE_OTHER): Payer: Medicare Other | Admitting: Cardiology

## 2012-06-04 ENCOUNTER — Encounter: Payer: Self-pay | Admitting: Cardiology

## 2012-06-04 VITALS — BP 132/80 | HR 59 | Ht 74.0 in | Wt 197.8 lb

## 2012-06-04 DIAGNOSIS — R0609 Other forms of dyspnea: Secondary | ICD-10-CM

## 2012-06-04 DIAGNOSIS — I10 Essential (primary) hypertension: Secondary | ICD-10-CM

## 2012-06-04 DIAGNOSIS — R5383 Other fatigue: Secondary | ICD-10-CM

## 2012-06-04 DIAGNOSIS — I2510 Atherosclerotic heart disease of native coronary artery without angina pectoris: Secondary | ICD-10-CM

## 2012-06-04 DIAGNOSIS — I4949 Other premature depolarization: Secondary | ICD-10-CM

## 2012-06-04 DIAGNOSIS — Z9889 Other specified postprocedural states: Secondary | ICD-10-CM

## 2012-06-04 DIAGNOSIS — I493 Ventricular premature depolarization: Secondary | ICD-10-CM

## 2012-06-04 DIAGNOSIS — E785 Hyperlipidemia, unspecified: Secondary | ICD-10-CM

## 2012-06-04 NOTE — Patient Instructions (Signed)
Continue your current therapy  I will see you again in 6 months.

## 2012-06-05 NOTE — Progress Notes (Signed)
[name] [name] Date of Birth: 09/22/1929   History of Present Illness: [name] [name] is seen today for followup. He just turned 86. Reports that he is feeling very well. He continues to walk on a treadmill 3 days a week stays active at home. The heart rate monitor on his treadmill indicates he is still having skipping in his heartbeat but he is asymptomatic with this. He denies any dizziness, chest pain, or shortness of breath.  Current Outpatient Prescriptions on File Prior to Visit  Medication Sig Dispense Refill  . acetaminophen (TYLENOL) 325 MG tablet Take 650 mg by mouth every 6 (six) hours as needed.        Marland Kitchen amLODipine (NORVASC) 5 MG tablet take 1 tablet by mouth once daily  30 tablet  9  . aspirin 325 MG EC tablet Take 325 mg by mouth daily.        . Cholecalciferol (VITAMIN D) 1000 UNITS capsule Take 1,000 Units by mouth daily.        . CRESTOR 20 MG tablet take 1 tablet by mouth once daily  30 each  5  . dutasteride (AVODART) 0.5 MG capsule Take 0.5 mg by mouth daily.        Marland Kitchen ibuprofen (ADVIL,MOTRIN) 200 MG tablet Take 200 mg by mouth every 6 (six) hours as needed.        . multivitamin (THERAGRAN) per tablet Take 1 tablet by mouth daily.        . Tamsulosin HCl (FLOMAX) 0.4 MG CAPS Take 0.4 mg by mouth daily.        . valsartan-hydrochlorothiazide (DIOVAN-HCT) 320-12.5 MG per tablet take 1 tablet by mouth once daily  30 tablet  5    No Known Allergies  Past Medical History  Diagnosis Date  . MVP (mitral valve prolapse)     s/p Mitral Valve Repair  . CAD (coronary artery disease)     s/p CABG x 1  . HTN (hypertension)   . Hyperlipidemia   . DJD (degenerative joint disease)   . BPH (benign prostatic hyperplasia)   . Arthritis   . Lumbar disc disease     Past Surgical History  Procedure Date  . Mitral valve repair January 2002  . Coronary artery bypass graft January 2002    Single bypass to the distal RCA  . Colonoscopy ~2008    no polyps per pt.  Dr. Doretha Sou [facility]  . Cholecystectomy     Dr. Lennie Hummer  . Hernia repair 05/05/11    Lap L inguinal & obturator herniae, R Femoral Hernia    History  Smoking status  . Never Smoker   Smokeless tobacco  . Never Used    History  Alcohol Use  . 0.0 oz/week  . 1-2 Glasses of wine per week    rare alcohol use    Family History  Problem Relation Age of Onset  . Stroke Father   . Stroke Mother     TIA    Review of Systems: As noted in history of present illness. All other systems were reviewed and are negative.  Physical Exam: BP 132/80  Pulse 59  Ht 6' 2"  (1.88 m)  Wt 197 lb 12.8 oz (89.721 kg)  BMI 25.40 kg/m2  SpO2 98% He is a pleasant white male in no acute distress. His HEENT exam is unremarkable. He has no JVD or bruits. Lungs are clear. Cardiac exam reveals a regular rate and rhythm without gallop or  click. There is a soft systolic murmur at the left lower sternal border. Abdomen is soft and nontender without masses or bruits. He has trace ankle edema.  LABORATORY DATA:   Assessment / Plan: 1. Mitral insufficiency status post mitral valve repair.  2. Coronary disease status post single-vessel coronary bypass surgery with a vein graft to the distal RCA. He is asymptomatic.  3. Hypertension, controlled.  4. Chronic PACs and PVCs. These are asymptomatic now.

## 2012-07-30 ENCOUNTER — Other Ambulatory Visit: Payer: Self-pay

## 2012-07-30 MED ORDER — VALSARTAN-HYDROCHLOROTHIAZIDE 320-12.5 MG PO TABS: 1.0000 | ORAL_TABLET | Freq: Every day | ORAL | Status: DC

## 2012-08-05 ENCOUNTER — Other Ambulatory Visit: Payer: Self-pay | Admitting: *Deleted

## 2012-10-01 ENCOUNTER — Ambulatory Visit (INDEPENDENT_AMBULATORY_CARE_PROVIDER_SITE_OTHER): Payer: Medicare Other | Admitting: Medical

## 2012-10-01 VITALS — BP 154/76 | HR 64 | Resp 16 | Wt 195.0 lb

## 2012-10-01 DIAGNOSIS — R030 Elevated blood-pressure reading, without diagnosis of hypertension: Secondary | ICD-10-CM

## 2012-10-01 DIAGNOSIS — R42 Dizziness and giddiness: Secondary | ICD-10-CM

## 2012-10-01 NOTE — Progress Notes (Signed)
Subjective: He reports "vertigo" the last 3 days.  He has had this before.  He reports dizziness, light headed, head feels bigger than normal, some sinus congestion, but denies fever, headache, no room spinning sensation, no numbness, tingling, weakness, confusion, no vision or hearing changes, no chest pain, no LOC.  Seems to be worse in afternoons.   She dizziness has been somewhat steady, lasting for hours.  He was mainly concerned as his BPs have been a little higher than usual the last few days.  BP yesterday 160-170s, 60s DBP.  Usually SBP 130s.   Past Medical History  Diagnosis Date  . MVP (mitral valve prolapse)     s/p Mitral Valve Repair  . CAD (coronary artery disease)     s/p CABG x 1  . HTN (hypertension)   . Hyperlipidemia   . DJD (degenerative joint disease)   . BPH (benign prostatic hyperplasia)   . Arthritis   . Lumbar disc disease     Review of Systems Constitutional: -fever, -chills, -sweats, -unexpected weight change,+fatigue ENT: +sneezing, +congestion, but -runny nose, -ear pain, -sore throat, +some sinus pressure, some hoarseness, +ringing in ears, longstanding, regularly Cardiology:  -chest pain, -palpitations, -edema Respiratory: -cough, -shortness of breath, -wheezing Gastroenterology: -abdominal pain, -nausea, -vomiting, +few episodes of diarrhea last weekend, -constipation  Hematology: -bleeding or bruising problems Musculoskeletal: -arthralgias, -myalgias, -joint swelling, -back pain Ophthalmology: -vision changes Urology: -dysuria, -difficulty urinating, -hematuria, -urinary frequency, -urgency, +nocturia, +dripping at times - hx/o BPH Neurology: -headache, -weakness, -tingling, -numbness   Objective: Filed Vitals:   10/01/12 1235  BP: 154/76  Pulse: 64  Resp:     General appearance: alert, no distress, WD/WN, pleasant HEENT: normocephalic, sclerae anicteric, TMs pearly, nares patent, no discharge or erythema, pharynx normal Oral cavity: MMM, no  lesions Neck: supple, no lymphadenopathy, no thyromegaly, no masses, no bruits Heart: occasional ectopic beat, otherwise RRR, brief early systolic murmur heard best left upper sternal border, othrewise normal S2 Lungs: CTA bilaterally, no wheezes, rhonchi, or rales Pulses: 2+ symmetric, upper and lower extremities, normal cap refill Ext: no edema, no cyanosis  Neuro: A&O, CN2-12 intact, normal DTRs, normal strength and sensation, romberg negative, slightly off balance with heel to toe, otherwise nonfocal exam   Assessment: Encounter Diagnoses  Name Primary?  Jola Baptist ear dysfunction, bilateral Yes  . Dizziness and giddiness   . Elevated BP     Plan: Discussed diagnosis and differential with patient.   Discussed case with Dr. Redmond School as well.  At this point he doesn't have true vertigo symptoms or syncope, but rather seems to have more of a labyrinthitis picture.   He has meclizine at home.   Advised he take this TID, increase water intake, avoid sudden movements, and if worse or no improvement, then let us know.  He has f/u scheduled next week with Dr. Redmond School here.

## 2012-10-02 ENCOUNTER — Encounter: Payer: Self-pay | Admitting: Medical

## 2012-10-07 ENCOUNTER — Ambulatory Visit (INDEPENDENT_AMBULATORY_CARE_PROVIDER_SITE_OTHER): Payer: Medicare Other | Admitting: Family Medicine

## 2012-10-07 ENCOUNTER — Encounter: Payer: Self-pay | Admitting: Family Medicine

## 2012-10-07 VITALS — BP 130/82 | HR 62 | Wt 198.0 lb

## 2012-10-07 DIAGNOSIS — M129 Arthropathy, unspecified: Secondary | ICD-10-CM

## 2012-10-07 DIAGNOSIS — E785 Hyperlipidemia, unspecified: Secondary | ICD-10-CM

## 2012-10-07 DIAGNOSIS — I2510 Atherosclerotic heart disease of native coronary artery without angina pectoris: Secondary | ICD-10-CM

## 2012-10-07 LAB — COMPREHENSIVE METABOLIC PANEL
ALT: 11 U/L (ref 0–53)
CO2: 30 mEq/L (ref 19–32)
Chloride: 104 mEq/L (ref 96–112)
Sodium: 141 mEq/L (ref 135–145)
Total Bilirubin: 0.7 mg/dL (ref 0.3–1.2)
Total Protein: 6.4 g/dL (ref 6.0–8.3)

## 2012-10-07 LAB — CBC WITH DIFFERENTIAL/PLATELET
Eosinophils Absolute: 0.3 10*3/uL (ref 0.0–0.7)
Lymphocytes Relative: 21 % (ref 12–46)
Lymphs Abs: 1.5 10*3/uL (ref 0.7–4.0)
Neutrophils Relative %: 66 % (ref 43–77)
Platelets: 217 10*3/uL (ref 150–400)
RBC: 4.81 MIL/uL (ref 4.22–5.81)
WBC: 7.4 10*3/uL (ref 4.0–10.5)

## 2012-10-07 LAB — LIPID PANEL
LDL Cholesterol: 66 mg/dL (ref 0–99)
Total CHOL/HDL Ratio: 3.1 Ratio
VLDL: 13 mg/dL (ref 0–40)

## 2012-10-07 NOTE — Progress Notes (Signed)
  Subjective:    Patient ID: [name] [name], male    DOB: 12-Nov-1929, 79 y.o.   MRN: [id number]  HPI He is here for an interval evaluation. He was recently seen and treated for dizziness. He has been using meclizine and presently is not having any difficulty. He also complains of bowel urgency especially after eating. This does not occur every time. He is also having difficulty with pain on the right groin area. He has no history of injury to this area. It bothers him when he gets in and out of bed. He also continues to have difficulty with chronic back pain as well as right SI joint discomfort. He apparently has had previous epidural injections which did help the back pain. He states that the pain bothers him at night and during the day when he is physically active he has much less discomfort. He continues to exercise fairly regularly. He does see his cardiologist regularly.social and family history were reviewed.he does have an advanced directive.   Review of Systems     Objective:   Physical Exam alert and in no distress. Tympanic membranes and canals are normal. Throat is clear. Tonsils are normal. Neck is supple without adenopathy or thyromegaly. Cardiac exam shows an  irregular sinus rhythm without murmurs or gallops. Lungs are clear to auscultation. Back exam shows slight tenderness over the right SI joint. Corky Sox test was equivocal. Exam of the right inguinal area shows tenderness to palpation at the insertion of the adductor. No lesions were palpable. No adenopathy noted.       Assessment & Plan:  Arthritis  HTN (hypertension) - Plan: CBC with Differential, Comprehensive metabolic panel  Hyperlipidemia - Plan: Lipid panel  CAD (coronary artery disease)  Strain of hip adductor muscle, right, initial encounter  Sacroiliitis, not elsewhere classified - Plan: Ambulatory referral to Orthopedic Surgery  Encounter for long-term (current) use of other medications - Plan: Lipid panel,  CBC with Differential, Comprehensive metabolic panel recommend heat and pain medication as needed for the abductor issue. I will refer him back to Dr. Nelva Bush for consideration of possible SI joint injection. He'll continue to be followed by his cardiologist. Junior present medication regimen.

## 2012-10-07 NOTE — Progress Notes (Signed)
PT HAS APT WITH DR.RAMOS AT [place] [facility] FEB 28 TH AT 12:30 pt is aware

## 2012-10-11 NOTE — Progress Notes (Signed)
Quick Note:  CALLED PT HOME # LEFT MESSAGE LIPIDS LOOK GOOD CONTINUE ON PRESENT MEDS ______

## 2012-10-14 ENCOUNTER — Telehealth: Payer: Self-pay | Admitting: Family Medicine

## 2012-10-14 NOTE — Telephone Encounter (Signed)
Pt called said he never heard back from his labs.  Reviewed same with pt.  He said he had appt with cardiologist and I advised they are posted in Epic and card can see them.  Also advised him that Cheri had left him a message.

## 2012-10-24 ENCOUNTER — Other Ambulatory Visit: Payer: Self-pay | Admitting: Family Medicine

## 2012-11-17 ENCOUNTER — Encounter: Payer: Self-pay | Admitting: Cardiology

## 2012-11-17 ENCOUNTER — Ambulatory Visit (INDEPENDENT_AMBULATORY_CARE_PROVIDER_SITE_OTHER): Payer: Medicare Other | Admitting: Cardiology

## 2012-11-17 VITALS — BP 140/80 | HR 68 | Ht 74.0 in | Wt 195.1 lb

## 2012-11-17 DIAGNOSIS — I2510 Atherosclerotic heart disease of native coronary artery without angina pectoris: Secondary | ICD-10-CM

## 2012-11-17 DIAGNOSIS — I491 Atrial premature depolarization: Secondary | ICD-10-CM

## 2012-11-17 DIAGNOSIS — Z9889 Other specified postprocedural states: Secondary | ICD-10-CM

## 2012-11-17 DIAGNOSIS — I4949 Other premature depolarization: Secondary | ICD-10-CM

## 2012-11-17 DIAGNOSIS — I493 Ventricular premature depolarization: Secondary | ICD-10-CM

## 2012-11-17 NOTE — Progress Notes (Signed)
[name] [name] Date of Birth: 10-07-1929   History of Present Illness: [name] [name] is seen today for followup. He states he is doing fairly well. He has been experiencing a lot of back pain particularly at night in the early morning. Once he gets warmed up he is usually able to do his normal activity including his exercise. He did receive well one back injection earlier. He denies any palpitations, chest pain, or shortness of breath. He is status post mitral valve repair with single vessel coronary bypass surgery in 2002.  Current Outpatient Prescriptions on File Prior to Visit  Medication Sig Dispense Refill  . acetaminophen (TYLENOL) 325 MG tablet Take 650 mg by mouth every 6 (six) hours as needed.        Marland Kitchen amLODipine (NORVASC) 5 MG tablet take 1 tablet by mouth once daily  30 tablet  9  . aspirin 325 MG EC tablet Take 325 mg by mouth daily.        . Cholecalciferol (VITAMIN D) 1000 UNITS capsule Take 1,000 Units by mouth daily.        . CRESTOR 20 MG tablet take 1 tablet by mouth once daily  30 tablet  5  . dutasteride (AVODART) 0.5 MG capsule Take 0.5 mg by mouth daily.        Marland Kitchen ibuprofen (ADVIL,MOTRIN) 200 MG tablet Take 200 mg by mouth every 6 (six) hours as needed.        . multivitamin (THERAGRAN) per tablet Take 1 tablet by mouth daily.        . Tamsulosin HCl (FLOMAX) 0.4 MG CAPS Take 0.4 mg by mouth daily.        . valsartan-hydrochlorothiazide (DIOVAN-HCT) 320-12.5 MG per tablet Take 1 tablet by mouth daily.  30 tablet  5   No current facility-administered medications on file prior to visit.    No Known Allergies  Past Medical History  Diagnosis Date  . MVP (mitral valve prolapse)     s/p Mitral Valve Repair  . CAD (coronary artery disease)     s/p CABG x 1  . HTN (hypertension)   . Hyperlipidemia   . DJD (degenerative joint disease)   . BPH (benign prostatic hyperplasia)   . Arthritis   . Lumbar disc disease     Past Surgical History  Procedure Laterality Date   . Mitral valve repair  January 2002  . Coronary artery bypass graft  January 2002    Single bypass to the distal RCA  . Colonoscopy  ~2008    no polyps per pt.  Dr. [name]  . Cholecystectomy      Dr. Lennie Hummer  . Hernia repair  05/05/11    Lap L inguinal & obturator herniae, R Femoral Hernia    History  Smoking status  . Never Smoker   Smokeless tobacco  . Never Used    History  Alcohol Use  . 0.0 oz/week  . 1-2 Glasses of wine per week    Comment: rare alcohol use    Family History  Problem Relation Age of Onset  . Stroke Father   . Stroke Mother     TIA    Review of Systems: As noted in history of present illness. All other systems were reviewed and are negative.  Physical Exam: BP 140/80  Pulse 68  Ht 6' 2"  (1.88 m)  Wt 195 lb 1.9 oz (88.506 kg)  BMI 25.04 kg/m2  SpO2 98% He is a  pleasant white male in no acute distress. His HEENT exam is unremarkable. He has no JVD or bruits. Lungs are clear. Cardiac exam reveals a regular rate and rhythm without gallop or click. There is a soft systolic murmur at the left lower sternal border. Abdomen is soft and nontender without masses or bruits. He has trace ankle edema.  LABORATORY DATA: ECG demonstrates normal sinus rhythm with first-degree AV block and occasional PACs. It is otherwise normal.  Assessment / Plan: 1. Mitral insufficiency status post mitral valve repair. Excellent long-term result.  2. Coronary disease status post single-vessel coronary bypass surgery with a vein graft to the distal RCA. He is asymptomatic.  3. Hypertension, controlled.  4. Chronic PACs and PVCs. These are asymptomatic now.

## 2012-11-17 NOTE — Patient Instructions (Signed)
Continue your current therapy  I will see you again in 6 months.

## 2012-12-23 ENCOUNTER — Other Ambulatory Visit: Payer: Self-pay | Admitting: *Deleted

## 2012-12-23 MED ORDER — AMLODIPINE BESYLATE 5 MG PO TABS: ORAL_TABLET | ORAL | Status: DC

## 2012-12-24 ENCOUNTER — Ambulatory Visit
Admission: RE | Admit: 2012-12-24 | Discharge: 2012-12-24 | Disposition: A | Discharge status: Home / Self Care | Payer: Medicare Other | Source: Ambulatory Visit | Attending: Medical | Admitting: Medical

## 2012-12-24 ENCOUNTER — Encounter: Payer: Self-pay | Admitting: Medical

## 2012-12-24 ENCOUNTER — Ambulatory Visit (INDEPENDENT_AMBULATORY_CARE_PROVIDER_SITE_OTHER): Payer: Medicare Other | Admitting: Medical

## 2012-12-24 VITALS — BP 130/80 | HR 58 | Temp 97.7°F | Resp 16 | Wt 189.0 lb

## 2012-12-24 DIAGNOSIS — R109 Unspecified abdominal pain: Secondary | ICD-10-CM

## 2012-12-24 LAB — POCT URINALYSIS DIPSTICK
Bilirubin, UA: NEGATIVE
Ketones, UA: NEGATIVE
Nitrite, UA: NEGATIVE
Protein, UA: NEGATIVE
pH, UA: 6

## 2012-12-24 NOTE — Progress Notes (Signed)
Subjective:  [name] [name] is a 79 y.o. male who presents with abdominal pain.  After dinner last night started having pain in stomach in middle of abdomen.  It worsened to being off the chart.  Almost went to the ED.   He notes hx/o kidney stones in the past, hx/o pancreatitis in the past, and this pain was similar in severity.  It was screaming type pain.  Used the bathroom, had small BM.  Forced the BM out, but afterwards felt nauseated, ended up dry heaving.  Ended up going to bed ok.  So far today has only eaten small piece of toast.  Today doesn't have quite the same pain, but is discomfort.  Has had headache all morning.  Overall feels much better this morning, but worried about this happening again.   He does have known hernia of upper abdomen from prior chest surgery/heart surgery.  He denies any nausea or severe pain this morning . BMs typically normal once daily.  Hx/o colonoscopy 7-8 years ago with Dr. Deatra Ina.  No other aggravating or relieving factors.    No other c/o.  The following portions of the patient's history were reviewed and updated as appropriate: allergies, current medications, past family history, past medical history, past social history, past surgical history and problem list.  ROS Otherwise as in subjective above  Objective: Physical Exam  Vital signs reviewed  General appearance: alert, no distress, WD/WN Neck: supple, no lymphadenopathy, no thyromegaly, no masses Heart: systolic 3/6 murmur heard best in right upper sternal border, normal S2 Lungs: CTA bilaterally, no wheezes, rhonchi, or rales Abdomen: +bs, upper abdomen with surgical scar and obvious hernia that is reducible, otherwise soft, non tender, non distended, no masses, no hepatomegaly, no splenomegaly Back: nontender Pulses: 2+ radial pulses, 2+ pedal pulses, normal cap refill Ext: no edema   Assessment: Encounter Diagnosis  Name Primary?  . Abdominal pain, unspecified site Yes     Plan: Etiology unclear.  Discussed possibilities - strangulated hernia that resolved, bowel obstruction, mesenteric ischemia, ileus, etc.  He seems ok and not in much pain currently with no symptoms suggestive of stone, infection or other.   Will send for KUB and use watch and wait approach.  Follow up: pending xray

## 2012-12-27 ENCOUNTER — Telehealth: Payer: Self-pay | Admitting: Internal Medicine

## 2012-12-27 NOTE — Telephone Encounter (Signed)
pls call and get some initial info from him, then can get me on the phone

## 2012-12-27 NOTE — Telephone Encounter (Signed)
Pt would like you to call him. He has questions about his diagnose on the image he had done the other day.

## 2013-01-04 ENCOUNTER — Telehealth: Payer: Self-pay | Admitting: Internal Medicine

## 2013-01-04 NOTE — Telephone Encounter (Signed)
Pt would like you to call him has he has come questions about his xray he had done

## 2013-01-05 NOTE — Telephone Encounter (Signed)
pls call and get him on phone about his questions

## 2013-01-07 NOTE — Telephone Encounter (Signed)
Chana Bode PA-C spoke with patient about his concerns. CLS

## 2013-01-10 ENCOUNTER — Encounter: Payer: Self-pay | Admitting: Gastroenterology

## 2013-01-10 ENCOUNTER — Telehealth: Payer: Self-pay | Admitting: Family Medicine

## 2013-01-10 NOTE — Telephone Encounter (Signed)
Patient is aware of his appointment to see Dr. Deatra Ina on January 31, 2013 @ 345 pm. CLS [phone]

## 2013-01-10 NOTE — Telephone Encounter (Signed)
Message copied by Armanda Magic on Mon Jan 10, 2013  9:52 AM ------      Message from: Carlena Hurl      Created: Fri Jan 07, 2013 12:11 PM      Regarding: abdominal pain, referral       Refer back to Dr. Leanora Ivanoff for bloating, gas, abdominal pain.   Send my recent OV notes, KUB xray results.   ------

## 2013-01-11 ENCOUNTER — Telehealth: Payer: Self-pay | Admitting: Family Medicine

## 2013-01-11 ENCOUNTER — Other Ambulatory Visit: Payer: Self-pay | Admitting: Medical

## 2013-01-11 DIAGNOSIS — R109 Unspecified abdominal pain: Secondary | ICD-10-CM

## 2013-01-11 DIAGNOSIS — R102 Pelvic and perineal pain: Secondary | ICD-10-CM

## 2013-01-11 DIAGNOSIS — K469 Unspecified abdominal hernia without obstruction or gangrene: Secondary | ICD-10-CM

## 2013-01-11 NOTE — Telephone Encounter (Signed)
Patient states that the last time he talk with you it was his understanding that he would see the GI doctor first and they would determine if he needed a CT or not. He said that since he hasn't had any real problems since he would like to hold off on the CT until he see's Dr. Deatra Ina first. He doesn't see any real urgency to get the CT done yet. CLS

## 2013-01-11 NOTE — Telephone Encounter (Signed)
Message copied by Armanda Magic on Tue Jan 11, 2013  9:06 AM ------      Message from: Carlena Hurl      Created: Tue Jan 11, 2013  7:51 AM       I've thought more about his case, symptoms.   I want to go ahead and do CT.  Please have him come in for CMET and set up CT abdomen/pelvis with contrast.  We already have him scheduled for GI appt, but lets get this to help evaluate and rule out some things. ------

## 2013-01-13 ENCOUNTER — Other Ambulatory Visit: Payer: Medicare Other

## 2013-01-13 DIAGNOSIS — R109 Unspecified abdominal pain: Secondary | ICD-10-CM

## 2013-01-13 DIAGNOSIS — K469 Unspecified abdominal hernia without obstruction or gangrene: Secondary | ICD-10-CM

## 2013-01-13 DIAGNOSIS — R102 Pelvic and perineal pain: Secondary | ICD-10-CM

## 2013-01-13 LAB — COMPREHENSIVE METABOLIC PANEL
BUN: 15 mg/dL (ref 6–23)
CO2: 27 mEq/L (ref 19–32)
Calcium: 8.7 mg/dL (ref 8.4–10.5)
Chloride: 103 mEq/L (ref 96–112)
Creat: 0.98 mg/dL (ref 0.50–1.35)

## 2013-01-17 ENCOUNTER — Ambulatory Visit
Admission: RE | Admit: 2013-01-17 | Discharge: 2013-01-17 | Disposition: A | Discharge status: Home / Self Care | Payer: Medicare Other | Source: Ambulatory Visit | Attending: Medical | Admitting: Medical

## 2013-01-17 DIAGNOSIS — R102 Pelvic and perineal pain unspecified side: Secondary | ICD-10-CM

## 2013-01-17 DIAGNOSIS — R109 Unspecified abdominal pain: Secondary | ICD-10-CM

## 2013-01-17 DIAGNOSIS — K469 Unspecified abdominal hernia without obstruction or gangrene: Secondary | ICD-10-CM

## 2013-01-17 MED ORDER — IOHEXOL 300 MG/ML  SOLN
100.0000 mL | Freq: Once | INTRAMUSCULAR | Status: AC | PRN
  Administered 2013-01-17: 100 mL via INTRAVENOUS

## 2013-01-19 ENCOUNTER — Encounter: Payer: Self-pay | Admitting: Medical

## 2013-01-19 ENCOUNTER — Ambulatory Visit (INDEPENDENT_AMBULATORY_CARE_PROVIDER_SITE_OTHER): Payer: Medicare Other | Admitting: Medical

## 2013-01-19 DIAGNOSIS — Q619 Cystic kidney disease, unspecified: Secondary | ICD-10-CM

## 2013-01-19 DIAGNOSIS — N400 Enlarged prostate without lower urinary tract symptoms: Secondary | ICD-10-CM

## 2013-01-19 DIAGNOSIS — N281 Cyst of kidney, acquired: Secondary | ICD-10-CM

## 2013-01-19 DIAGNOSIS — I700 Atherosclerosis of aorta: Secondary | ICD-10-CM

## 2013-01-19 DIAGNOSIS — I2510 Atherosclerotic heart disease of native coronary artery without angina pectoris: Secondary | ICD-10-CM

## 2013-01-19 DIAGNOSIS — R109 Unspecified abdominal pain: Secondary | ICD-10-CM

## 2013-01-19 NOTE — Progress Notes (Signed)
Subjective:  [name] [name] is a 79 y.o. male who presents for results of CT abdomen pelvis after recent abdominal pain.   I saw him a few weeks ago for intermittent moderate to severe abdominal pain.  Gets intermittent pains in middle of abdomen. Gets intermittent bloating and gas.  He notes hx/o kidney stones in the past, hx/o pancreatitis in the past, and this pain was similar in severity.  It was screaming type pain.   He does have known hernia of upper abdomen from prior chest surgery/heart surgery.  BMs typically normal once daily.  Hx/o colonoscopy 7-8 years ago with Dr. Deatra Ina.    He sees urology for his "record breaking" prostate enlargement.  No other aggravating or relieving factors.    No other c/o.  The following portions of the patient's history were reviewed and updated as appropriate: allergies, current medications, past family history, past medical history, past social history, past surgical history and problem list.  No Known Allergies  Current Outpatient Prescriptions on File Prior to Visit  Medication Sig Dispense Refill  . acetaminophen (TYLENOL) 325 MG tablet Take 650 mg by mouth every 6 (six) hours as needed.        Marland Kitchen amLODipine (NORVASC) 5 MG tablet take 1 tablet by mouth once daily  30 tablet  5  . aspirin 325 MG EC tablet Take 325 mg by mouth daily.        . Cholecalciferol (VITAMIN D) 1000 UNITS capsule Take 1,000 Units by mouth daily.        . CRESTOR 20 MG tablet take 1 tablet by mouth once daily  30 tablet  5  . dutasteride (AVODART) 0.5 MG capsule Take 0.5 mg by mouth daily.        Marland Kitchen gabapentin (NEURONTIN) 300 MG capsule Take 300 mg by mouth as directed.       Marland Kitchen ibuprofen (ADVIL,MOTRIN) 200 MG tablet Take 200 mg by mouth every 6 (six) hours as needed.        . multivitamin (THERAGRAN) per tablet Take 1 tablet by mouth daily.        . Tamsulosin HCl (FLOMAX) 0.4 MG CAPS Take 0.4 mg by mouth daily.        . valsartan-hydrochlorothiazide (DIOVAN-HCT) 320-12.5  MG per tablet Take 1 tablet by mouth daily.  30 tablet  5   No current facility-administered medications on file prior to visit.    Past Medical History  Diagnosis Date  . MVP (mitral valve prolapse)     s/p Mitral Valve Repair  . CAD (coronary artery disease)     s/p CABG x 1  . HTN (hypertension)   . Hyperlipidemia   . DJD (degenerative joint disease)   . BPH (benign prostatic hyperplasia)   . Arthritis   . Lumbar disc disease     Past Surgical History  Procedure Laterality Date  . Mitral valve repair  January 2002  . Coronary artery bypass graft  January 2002    Single bypass to the distal RCA  . Colonoscopy  ~2008    no polyps per pt.  Dr. [name]  . Cholecystectomy      Dr. Lennie Hummer  . Hernia repair  05/05/11    Lap L inguinal & obturator herniae, R Femoral Hernia    Family History  Problem Relation Age of Onset  . Stroke Father   . Stroke Mother     TIA    History   Social History  .  Marital Status: Married    Spouse Name: N/A    Number of Children: 2  . Years of Education: N/A   Occupational History  . Retired     Scientist, research (physical sciences)  .     Social History Main Topics  . Smoking status: Never Smoker   . Smokeless tobacco: Never Used  . Alcohol Use: 0.0 oz/week    1-2 Glasses of wine per week     Comment: rare alcohol use  . Drug Use: No  . Sexually Active:    Other Topics Concern  . Not on file   Social History Narrative  . No narrative on file    Reviewed their medical, surgical, family, social, medication, and allergy history and updated chart as appropriate.    ROS Otherwise as in subjective above  Objective: Physical Exam  Vital signs reviewed  General appearance: alert, no distress, WD/WN Neck: supple, no lymphadenopathy, no thyromegaly, no masses Heart: systolic 3/6 murmur heard best in right upper sternal border, normal S2 Lungs: CTA bilaterally, no wheezes, rhonchi, or rales Abdomen: +bs, upper abdomen with surgical  scar and obvious hernia that is reducible, otherwise soft, non tender, non distended, no masses, no hepatomegaly, no splenomegaly Back: nontender Pulses: 2+ radial pulses, 2+ pedal pulses, normal cap refill Ext: no edema  CT abdomen/pelvis 01/11/13 IMPRESSION:  Intrahepatic biliary duct prominence. Mild prominence of the  common bile duct and pancreatic duct. No calcified common bile  duct stone or pancreatic mass identified. No findings to suggest  pancreatic inflammation. Findings may represent result of  patient's age and prior cholecystectomy.  Bilateral renal cysts measuring up to 10 cm.  Small sliding type hiatal hernia. Colonic diverticula with the  sigmoid muscular hypertrophy. No extraluminal bowel inflammatory  process, free fluid or free air.  Circumferential thickening of the terminal ileum. This may  represent result of under distension although inflammatory process  or other abnormality not excluded.  Enlarged asymmetric prostate gland with impression upon the bladder  base. Clinical and laboratory correlation recommended.  Atherosclerotic type changes as detailed above.  Degenerative changes lumbar spine and hip joints.   Upright KUB 12/24/12 IMPRESSION:  A few scattered air-fluid levels. Possible ileus but difficult to  exclude developing partial SBO. No free air.   Assessment: Encounter Diagnoses  Name Primary?  . Abdominal pain, unspecified site Yes  . Renal cyst   . Atherosclerosis of aorta   . BPH (benign prostatic hyperplasia)   . CAD (coronary artery disease)      Plan: Reviewed CT.  Advised he f/u with Dr. Deatra Ina as planned for ongoing abdominal pain.  Avoid spicy and acidic foods,eat fiber and drink plenty of water daily.  In the future may need nephrology eval regarding the renal cysts.  Recent BUN/Cr fine.  Going fowrard, next routine visit need to check microalbumin creatine ratio.  Of note, he is compliant with crestor, has hx/o CAD, hyperlipidemia,  BP is controlled on current medication, he is followed by urology for the enlarged prostate, and he is aware of his degenerative disease of the low back, has seen back specialist for this and had MRI within the last year or so.  He is s/p cholecystectomy.

## 2013-01-25 ENCOUNTER — Other Ambulatory Visit: Payer: Self-pay | Admitting: *Deleted

## 2013-01-25 MED ORDER — VALSARTAN-HYDROCHLOROTHIAZIDE 320-12.5 MG PO TABS: 1.0000 | ORAL_TABLET | Freq: Every day | ORAL | Status: DC

## 2013-01-31 ENCOUNTER — Ambulatory Visit (INDEPENDENT_AMBULATORY_CARE_PROVIDER_SITE_OTHER): Payer: Medicare Other | Admitting: Gastroenterology

## 2013-01-31 ENCOUNTER — Encounter: Payer: Self-pay | Admitting: Gastroenterology

## 2013-01-31 VITALS — BP 152/80 | HR 60 | Ht 74.0 in | Wt 189.2 lb

## 2013-01-31 DIAGNOSIS — R1031 Right lower quadrant pain: Secondary | ICD-10-CM

## 2013-01-31 DIAGNOSIS — K4190 Unilateral femoral hernia, without obstruction or gangrene, not specified as recurrent: Secondary | ICD-10-CM

## 2013-01-31 DIAGNOSIS — R1013 Epigastric pain: Secondary | ICD-10-CM | POA: Insufficient documentation

## 2013-01-31 MED ORDER — ALPRAZOLAM 0.5 MG PO TABS: ORAL_TABLET | ORAL | Status: DC

## 2013-01-31 NOTE — Assessment & Plan Note (Addendum)
79 year old gentleman with six-week history of episodic severe midepigastric pain. Pain is short duration but intense. History of pancreatitis and pain reminiscent of pancreatic pain, together with CT findings of prominent pancreatic and bile ducts, raises the concern of a retained bile duct stone. It is also noteworthy that the patient has diastases recti and may be experiencing transient incarceration of a ventral hernia. I think it is unlikely that he has intermittent bowel obstruction. This is not the presentation for peptic ulcer disease.  Recommendations #1 MRCP;  #2 t/c EGD pending results of #1 #3 stat evaluation for lap during episodes of pain including amylase, lipase and LFTs #4 should workup be negative I would consider surgical evaluation for possible ventral hernias

## 2013-01-31 NOTE — Progress Notes (Signed)
History of Present Illness: 79 year old white male referred at the request of Dr. Redmond School for evaluation of abdominal pain. Over the past 5 weeks she's had 5 discrete episodes of severe upper epigastric pain. The first episode was the worst and lasted about 30 minutes. It reminded him of pain that he experienced when he had acute pancreatitis several years ago. Pain is without radiation. It is not accompanied by nausea or vomiting. He's had 4 subsequent episodes of pain that have been of shorter duration and less severe.  He underwent an acute abdominal series within a day of the initial episode that demonstrated a few air-fluid levels in the small bowel. CT scan, which I reviewed as well, demonstrated prominence of the intrahepatic ducts, common bile duct and pancreatic duct.  There is questionable circumferential thickening in the terminal ileum that may represent under distention. In addition, he complains of right groin pain that seems to be related to straining or twisting.  He complains of excess belching and flatus.    Past Medical History  Diagnosis Date  . MVP (mitral valve prolapse)     s/p Mitral Valve Repair  . CAD (coronary artery disease)     s/p CABG x 1  . HTN (hypertension)   . Hyperlipidemia   . DJD (degenerative joint disease)   . BPH (benign prostatic hyperplasia)   . Arthritis   . Lumbar disc disease   . Pancreatitis    Past Surgical History  Procedure Laterality Date  . Mitral valve repair  January 2002  . Coronary artery bypass graft  January 2002    Single bypass to the distal RCA  . Colonoscopy  ~2008    no polyps per pt.  Dr. [name]  . Cholecystectomy      Dr. Lennie Hummer  . Hernia repair  05/05/11    Lap L inguinal & obturator herniae, R Femoral Hernia   family history includes Stroke in his father and mother. Current Outpatient Prescriptions  Medication Sig Dispense Refill  . amLODipine (NORVASC) 5 MG tablet take 1 tablet by mouth once daily  30  tablet  5  . aspirin 325 MG EC tablet Take 325 mg by mouth daily.        . Cholecalciferol (VITAMIN D) 1000 UNITS capsule Take 1,000 Units by mouth daily.        . CRESTOR 20 MG tablet take 1 tablet by mouth once daily  30 tablet  5  . dutasteride (AVODART) 0.5 MG capsule Take 0.5 mg by mouth daily.        Marland Kitchen gabapentin (NEURONTIN) 300 MG capsule Take 300 mg by mouth as directed.       . multivitamin (THERAGRAN) per tablet Take 1 tablet by mouth daily.        . Tamsulosin HCl (FLOMAX) 0.4 MG CAPS Take 0.4 mg by mouth daily.        . valsartan-hydrochlorothiazide (DIOVAN-HCT) 320-12.5 MG per tablet Take 1 tablet by mouth daily.  30 tablet  5   No current facility-administered medications for this visit.   Allergies as of 01/31/2013  . (No Known Allergies)    reports that he has never smoked. He has never used smokeless tobacco. He reports that  drinks alcohol. He reports that he does not use illicit drugs.     Review of Systems: Pertinent positive and negative review of systems were noted in the above HPI section. All other review of systems were otherwise negative.  Vital  signs were reviewed in today's medical record Physical Exam: General: Well developed , well nourished, no acute distress Skin: anicteric Head: Normocephalic and atraumatic Eyes:  sclerae anicteric, EOMI Ears: Normal auditory acuity Mouth: No deformity or lesions Neck: Supple, no masses or thyromegaly Lungs: Clear throughout to auscultation Heart: Regular rate and rhythm; no murmurs, rubs or bruits Abdomen: Soft, non tender and non distended. No masses, hepatosplenomegaly or hernias noted. Normal Bowel sounds. He has diastases recti. In the right groin there is slight tenderness on palpation of the area of the femoral ligament. There is no obvious hernia. Rectal:deferred Musculoskeletal: Symmetrical with no gross deformities  Skin: No lesions on visible extremities Pulses:  Normal pulses noted Extremities: No  clubbing, cyanosis, edema or deformities noted Neurological: Alert oriented x 4, grossly nonfocal Cervical Nodes:  No significant cervical adenopathy Inguinal Nodes: No significant inguinal adenopathy Psychological:  Alert and cooperative. Normal mood and affect

## 2013-01-31 NOTE — Patient Instructions (Addendum)
We made the appointment  for the abdominal MRCP at [facility] building next to [facility] for Thursday  6-26  at 7 PM. Go to the first floor to the right. Have nothing by mouth after 3:00 PM    If you need to reschedule, call [phone].  We will call you with the results.

## 2013-01-31 NOTE — Assessment & Plan Note (Deleted)
79 year old gentleman with six-week history of episodic severe midepigastric pain. Pain is short duration but intense. With history of pancreatitis and pain reminiscent of pancreatic pain, together with CT findings of prominent pancreatic and bile ducts, raises the concern of a retained bile duct stone. It is also noteworthy that the patient has diastases recti and may be experiencing transient incarceration of a ventral hernia. I think it is unlikely that he has intermittent bowel obstruction. This is not the presentation for peptic ulcer disease.  Recommendations #1 MRCP #2 stat evaluation for lap during episodes of pain including amylase, lipase and LFTs #3 should workup be negative I would consider surgical evaluation for possible ventral hernias

## 2013-01-31 NOTE — Assessment & Plan Note (Signed)
There is no obvious hernia by my exam. Pain is probably musculoskeletal. Would consider a trial of NSAIDs pending evaluation of his epigastric pain.

## 2013-02-02 ENCOUNTER — Other Ambulatory Visit: Payer: Self-pay | Admitting: Gastroenterology

## 2013-02-02 DIAGNOSIS — R1013 Epigastric pain: Secondary | ICD-10-CM

## 2013-02-03 ENCOUNTER — Ambulatory Visit (HOSPITAL_COMMUNITY)
Admission: RE | Admit: 2013-02-03 | Discharge: 2013-02-03 | Disposition: A | Discharge status: Home / Self Care | Payer: Medicare Other | Source: Ambulatory Visit | Attending: Gastroenterology | Admitting: Gastroenterology

## 2013-02-03 DIAGNOSIS — Z9089 Acquired absence of other organs: Secondary | ICD-10-CM | POA: Insufficient documentation

## 2013-02-03 DIAGNOSIS — K8050 Calculus of bile duct without cholangitis or cholecystitis without obstruction: Secondary | ICD-10-CM | POA: Insufficient documentation

## 2013-02-03 DIAGNOSIS — N281 Cyst of kidney, acquired: Secondary | ICD-10-CM | POA: Insufficient documentation

## 2013-02-03 DIAGNOSIS — R1013 Epigastric pain: Secondary | ICD-10-CM

## 2013-02-03 DIAGNOSIS — K7689 Other specified diseases of liver: Secondary | ICD-10-CM | POA: Insufficient documentation

## 2013-02-03 MED ORDER — GADOBENATE DIMEGLUMINE 529 MG/ML IV SOLN
18.0000 mL | Freq: Once | INTRAVENOUS | Status: AC | PRN
  Administered 2013-02-03: 18 mL via INTRAVENOUS

## 2013-02-08 ENCOUNTER — Telehealth: Payer: Self-pay | Admitting: Gastroenterology

## 2013-02-08 NOTE — Telephone Encounter (Signed)
Discussed with pt that he has 4 stones in the bile duct that show up on the MRCP. Per Dr. Deatra Ina he would like to do ERCP with spyglass on the pt. Waiting to hear back from Dr. Deatra Ina as to when this can be scheduled. Pt instructed to go to the ER if he has another "attack". Pt verbalized understanding.

## 2013-02-14 ENCOUNTER — Other Ambulatory Visit: Payer: Self-pay | Admitting: Gastroenterology

## 2013-02-14 ENCOUNTER — Telehealth: Payer: Self-pay

## 2013-02-14 DIAGNOSIS — K8020 Calculus of gallbladder without cholecystitis without obstruction: Secondary | ICD-10-CM

## 2013-02-14 NOTE — Telephone Encounter (Signed)
Message copied by Algernon Huxley on Mon Feb 14, 2013  3:36 PM ------      Message from: Erskine Emery D      Created: Sun Feb 13, 2013 12:15 PM       OK for hospital week.      ----- Message -----         From: Maury Dus, RN         Sent: 02/08/2013   9:49 AM           To: [name], MD            Dr. Deatra Ina,            I tried to schedule this for the afternoon of 02/18/13 when you are off but endo could not do it then and Remo Lipps thought you had a plane to catch that afternoon.            I tried to schedule it on 02/15/13 but they could not do it until 1pm and Remo Lipps said that was to late to start. You are hospital doc the week of 02/21/13, Remo Lipps suggested one day that week. Please let me know.            Thanks,      Vaughan Basta             ----- Message -----         From: [name], MD         Sent: 02/04/2013  11:25 AM           To: Maury Dus, RN            Please schedule ERCP with "spyglass".  Ask endo to rent the lithotripser.  Preop with cipro 437m IV.  Try to schedule the week of July 7.  I will need a 2 hour slot (can do during afternoon off).      Inform pt that we see CBD stones.      ----- Message -----         From: COlene Floss RN         Sent: 02/04/2013   9:13 AM           To: RInda Castle MD            Please note the MRI results                   ------

## 2013-02-14 NOTE — Telephone Encounter (Signed)
Pt scheduled for ERCP with spyglass/lithotripsy at [facility] 02/22/13@12 :30pm. Scheduled with Sharee Pimple. Pt to have clear liquids after midnight up until 0830 the morning of the procedure. Pt aware of appt date and time. Prep instructions mailed to pt.

## 2013-02-15 ENCOUNTER — Encounter (HOSPITAL_COMMUNITY): Payer: Self-pay | Admitting: *Deleted

## 2013-02-16 ENCOUNTER — Encounter (HOSPITAL_COMMUNITY): Payer: Self-pay | Admitting: Pharmacy Technician

## 2013-02-22 ENCOUNTER — Encounter (HOSPITAL_COMMUNITY)
Admission: RE | Disposition: A | Discharge status: Home / Self Care | Payer: Self-pay | Source: Ambulatory Visit | Attending: Gastroenterology

## 2013-02-22 ENCOUNTER — Encounter (HOSPITAL_COMMUNITY): Payer: Self-pay | Admitting: *Deleted

## 2013-02-22 ENCOUNTER — Encounter (HOSPITAL_COMMUNITY): Payer: Self-pay | Admitting: Anesthesiology

## 2013-02-22 ENCOUNTER — Ambulatory Visit (HOSPITAL_COMMUNITY): Payer: Medicare Other

## 2013-02-22 ENCOUNTER — Inpatient Hospital Stay (HOSPITAL_COMMUNITY): Payer: Medicare Other

## 2013-02-22 ENCOUNTER — Inpatient Hospital Stay (HOSPITAL_COMMUNITY)
Admission: RE | Admit: 2013-02-22 | Discharge: 2013-02-24 | DRG: 444 | Disposition: A | Discharge status: Home / Self Care | Payer: Medicare Other | Source: Ambulatory Visit | Attending: Gastroenterology | Admitting: Gastroenterology

## 2013-02-22 ENCOUNTER — Encounter: Payer: Self-pay | Admitting: *Deleted

## 2013-02-22 ENCOUNTER — Ambulatory Visit (HOSPITAL_COMMUNITY): Payer: Medicare Other | Admitting: Anesthesiology

## 2013-02-22 DIAGNOSIS — K859 Acute pancreatitis without necrosis or infection, unspecified: Secondary | ICD-10-CM | POA: Diagnosis present

## 2013-02-22 DIAGNOSIS — K8050 Calculus of bile duct without cholangitis or cholecystitis without obstruction: Principal | ICD-10-CM

## 2013-02-22 DIAGNOSIS — I10 Essential (primary) hypertension: Secondary | ICD-10-CM | POA: Diagnosis present

## 2013-02-22 DIAGNOSIS — I2510 Atherosclerotic heart disease of native coronary artery without angina pectoris: Secondary | ICD-10-CM | POA: Diagnosis present

## 2013-02-22 DIAGNOSIS — N138 Other obstructive and reflux uropathy: Secondary | ICD-10-CM | POA: Diagnosis present

## 2013-02-22 DIAGNOSIS — N401 Enlarged prostate with lower urinary tract symptoms: Secondary | ICD-10-CM | POA: Diagnosis present

## 2013-02-22 DIAGNOSIS — R339 Retention of urine, unspecified: Secondary | ICD-10-CM

## 2013-02-22 DIAGNOSIS — E785 Hyperlipidemia, unspecified: Secondary | ICD-10-CM | POA: Diagnosis present

## 2013-02-22 DIAGNOSIS — K8020 Calculus of gallbladder without cholecystitis without obstruction: Secondary | ICD-10-CM

## 2013-02-22 DIAGNOSIS — Z951 Presence of aortocoronary bypass graft: Secondary | ICD-10-CM

## 2013-02-22 DIAGNOSIS — R1013 Epigastric pain: Secondary | ICD-10-CM

## 2013-02-22 DIAGNOSIS — Z79899 Other long term (current) drug therapy: Secondary | ICD-10-CM

## 2013-02-22 HISTORY — PX: UPPER GASTROINTESTINAL ENDOSCOPY: SHX188

## 2013-02-22 HISTORY — PX: SPYGLASS CHOLANGIOSCOPY: SHX5441

## 2013-02-22 HISTORY — PX: SPYGLASS LITHOTRIPSY: SHX5537

## 2013-02-22 HISTORY — PX: ERCP: SHX5425

## 2013-02-22 LAB — LIPASE, BLOOD: Lipase: 45 U/L (ref 11–59)

## 2013-02-22 LAB — AMYLASE: Amylase: 64 U/L (ref 0–105)

## 2013-02-22 SURGERY — ENDOSCOPIC RETROGRADE CHOLANGIOPANCREATOGRAPHY (ERCP)
Anesthesia: General

## 2013-02-22 MED ORDER — SODIUM CHLORIDE 0.9 % IV SOLN: 250.0000 mL | INTRAVENOUS | Status: DC | PRN

## 2013-02-22 MED ORDER — DUTASTERIDE 0.5 MG PO CAPS
0.5000 mg | ORAL_CAPSULE | Freq: Every day | ORAL | Status: DC
  Administered 2013-02-22 – 2013-02-24 (×3): 0.5 mg via ORAL
  Filled 2013-02-22 (×3): qty 1

## 2013-02-22 MED ORDER — POTASSIUM CHLORIDE IN NACL 20-0.45 MEQ/L-% IV SOLN
INTRAVENOUS | Status: DC
  Administered 2013-02-22: 100 mL/h via INTRAVENOUS
  Administered 2013-02-23 – 2013-02-24 (×4): via INTRAVENOUS
  Filled 2013-02-22 (×9): qty 1000

## 2013-02-22 MED ORDER — AMLODIPINE BESYLATE 5 MG PO TABS
5.0000 mg | ORAL_TABLET | Freq: Every evening | ORAL | Status: DC
  Administered 2013-02-22 – 2013-02-23 (×2): 5 mg via ORAL
  Filled 2013-02-22 (×3): qty 1

## 2013-02-22 MED ORDER — CIPROFLOXACIN IN D5W 400 MG/200ML IV SOLN
INTRAVENOUS | Status: DC | PRN
  Administered 2013-02-22: 400 mg via INTRAVENOUS

## 2013-02-22 MED ORDER — ONDANSETRON HCL 4 MG/2ML IJ SOLN: 4.0000 mg | Freq: Four times a day (QID) | INTRAMUSCULAR | Status: DC | PRN

## 2013-02-22 MED ORDER — CIPROFLOXACIN IN D5W 400 MG/200ML IV SOLN
INTRAVENOUS | Status: AC
  Filled 2013-02-22: qty 200

## 2013-02-22 MED ORDER — HYDROMORPHONE HCL PF 1 MG/ML IJ SOLN
INTRAMUSCULAR | Status: AC
  Filled 2013-02-22: qty 1

## 2013-02-22 MED ORDER — HYDROMORPHONE HCL PF 1 MG/ML IJ SOLN
1.0000 mg | INTRAMUSCULAR | Status: DC | PRN
  Administered 2013-02-22 – 2013-02-23 (×2): 1 mg via INTRAVENOUS
  Filled 2013-02-22: qty 1

## 2013-02-22 MED ORDER — TAMSULOSIN HCL 0.4 MG PO CAPS
0.4000 mg | ORAL_CAPSULE | Freq: Every day | ORAL | Status: DC
  Administered 2013-02-22 – 2013-02-24 (×3): 0.4 mg via ORAL
  Filled 2013-02-22 (×3): qty 1

## 2013-02-22 MED ORDER — ATORVASTATIN CALCIUM 40 MG PO TABS
40.0000 mg | ORAL_TABLET | Freq: Every day | ORAL | Status: DC
  Administered 2013-02-22 – 2013-02-23 (×2): 40 mg via ORAL
  Filled 2013-02-22 (×3): qty 1

## 2013-02-22 MED ORDER — ONDANSETRON HCL 4 MG/2ML IJ SOLN: 4.0000 mg | Freq: Once | INTRAMUSCULAR | Status: DC

## 2013-02-22 MED ORDER — SODIUM CHLORIDE 0.9 % IV SOLN
INTRAVENOUS | Status: DC
Start: 2013-02-22 — End: 2013-02-22

## 2013-02-22 MED ORDER — CIPROFLOXACIN IN D5W 400 MG/200ML IV SOLN
400.0000 mg | Freq: Once | INTRAVENOUS | Status: AC
  Administered 2013-02-22: 400 mg via INTRAVENOUS

## 2013-02-22 MED ORDER — ROCURONIUM BROMIDE 100 MG/10ML IV SOLN
INTRAVENOUS | Status: DC | PRN
  Administered 2013-02-22: 30 mg via INTRAVENOUS

## 2013-02-22 MED ORDER — ACETAMINOPHEN 325 MG PO TABS
650.0000 mg | ORAL_TABLET | Freq: Four times a day (QID) | ORAL | Status: DC | PRN
  Administered 2013-02-22: 650 mg via ORAL
  Filled 2013-02-22 (×2): qty 2

## 2013-02-22 MED ORDER — FENTANYL CITRATE 0.05 MG/ML IJ SOLN
25.0000 ug | Freq: Once | INTRAMUSCULAR | Status: AC
  Administered 2013-02-22: 25 ug via INTRAVENOUS

## 2013-02-22 MED ORDER — SODIUM CHLORIDE 0.9 % IJ SOLN
3.0000 mL | Freq: Two times a day (BID) | INTRAMUSCULAR | Status: DC
  Administered 2013-02-23: 3 mL via INTRAVENOUS

## 2013-02-22 MED ORDER — FENTANYL CITRATE 0.05 MG/ML IJ SOLN
INTRAMUSCULAR | Status: AC
  Filled 2013-02-22: qty 2

## 2013-02-22 MED ORDER — HYDROCODONE-ACETAMINOPHEN 5-325 MG PO TABS: 1.0000 | ORAL_TABLET | ORAL | Status: DC | PRN

## 2013-02-22 MED ORDER — ONDANSETRON HCL 4 MG PO TABS
4.0000 mg | ORAL_TABLET | Freq: Four times a day (QID) | ORAL | Status: DC | PRN
  Filled 2013-02-22: qty 1

## 2013-02-22 MED ORDER — EPHEDRINE SULFATE 50 MG/ML IJ SOLN
INTRAMUSCULAR | Status: DC | PRN
  Administered 2013-02-22: 5 mg via INTRAVENOUS

## 2013-02-22 MED ORDER — LACTATED RINGERS IV SOLN
INTRAVENOUS | Status: DC | PRN
  Administered 2013-02-22: 11:00:00 via INTRAVENOUS

## 2013-02-22 MED ORDER — NEOSTIGMINE METHYLSULFATE 1 MG/ML IJ SOLN
INTRAMUSCULAR | Status: DC | PRN
  Administered 2013-02-22: 4 mg via INTRAVENOUS

## 2013-02-22 MED ORDER — LIDOCAINE HCL (CARDIAC) 20 MG/ML IV SOLN
INTRAVENOUS | Status: DC | PRN
  Administered 2013-02-22: 100 mg via INTRAVENOUS

## 2013-02-22 MED ORDER — VALSARTAN-HYDROCHLOROTHIAZIDE 320-12.5 MG PO TABS: 1.0000 | ORAL_TABLET | Freq: Every morning | ORAL | Status: DC

## 2013-02-22 MED ORDER — SODIUM CHLORIDE 0.9 % IJ SOLN: 3.0000 mL | INTRAMUSCULAR | Status: DC | PRN

## 2013-02-22 MED ORDER — ONDANSETRON HCL 4 MG/2ML IJ SOLN
INTRAMUSCULAR | Status: AC
  Filled 2013-02-22: qty 2

## 2013-02-22 MED ORDER — LACTATED RINGERS IV SOLN: INTRAVENOUS | Status: DC

## 2013-02-22 MED ORDER — FENTANYL CITRATE 0.05 MG/ML IJ SOLN
INTRAMUSCULAR | Status: DC | PRN
  Administered 2013-02-22 (×3): 50 ug via INTRAVENOUS

## 2013-02-22 MED ORDER — GABAPENTIN 300 MG PO CAPS
300.0000 mg | ORAL_CAPSULE | Freq: Three times a day (TID) | ORAL | Status: DC
  Administered 2013-02-22 – 2013-02-24 (×6): 300 mg via ORAL
  Filled 2013-02-22 (×8): qty 1

## 2013-02-22 MED ORDER — ACETAMINOPHEN 650 MG RE SUPP
650.0000 mg | Freq: Four times a day (QID) | RECTAL | Status: DC | PRN
  Filled 2013-02-22: qty 1

## 2013-02-22 MED ORDER — SODIUM CHLORIDE 0.9 % IJ SOLN
3.0000 mL | Freq: Two times a day (BID) | INTRAMUSCULAR | Status: DC
Start: 2013-02-22 — End: 2013-02-24
  Administered 2013-02-24: 3 mL via INTRAVENOUS

## 2013-02-22 MED ORDER — PROMETHAZINE HCL 25 MG/ML IJ SOLN: 6.2500 mg | INTRAMUSCULAR | Status: DC | PRN

## 2013-02-22 MED ORDER — GLYCOPYRROLATE 0.2 MG/ML IJ SOLN
INTRAMUSCULAR | Status: DC | PRN
  Administered 2013-02-22: .6 mg via INTRAVENOUS

## 2013-02-22 MED ORDER — SODIUM CHLORIDE 0.9 % IJ SOLN
INTRAMUSCULAR | Status: DC | PRN
  Administered 2013-02-22: 14:00:00

## 2013-02-22 MED ORDER — HYDROCHLOROTHIAZIDE 12.5 MG PO CAPS
12.5000 mg | ORAL_CAPSULE | Freq: Every day | ORAL | Status: DC
  Administered 2013-02-23 – 2013-02-24 (×2): 12.5 mg via ORAL
  Filled 2013-02-22 (×2): qty 1

## 2013-02-22 MED ORDER — PROPOFOL 10 MG/ML IV BOLUS
INTRAVENOUS | Status: DC | PRN
  Administered 2013-02-22: 150 mg via INTRAVENOUS

## 2013-02-22 MED ORDER — IRBESARTAN 300 MG PO TABS
300.0000 mg | ORAL_TABLET | Freq: Every day | ORAL | Status: DC
  Administered 2013-02-23 – 2013-02-24 (×2): 300 mg via ORAL
  Filled 2013-02-22 (×2): qty 1

## 2013-02-22 NOTE — Anesthesia Preprocedure Evaluation (Signed)
Anesthesia Evaluation  Patient identified by MRN, date of birth, ID band Patient awake    Reviewed: Allergy & Precautions, H&P , NPO status , Patient's Chart, lab work & pertinent test results  Airway Mallampati: II TM Distance: >3 FB Neck ROM: Full    Dental  (+) Teeth Intact and Dental Advisory Given   Pulmonary shortness of breath and with exertion,  breath sounds clear to auscultation        Cardiovascular hypertension, + CAD, + CABG and + DOE negative cardio ROS  + dysrhythmias + Valvular Problems/Murmurs MVP Rhythm:Irregular Rate:Normal     Neuro/Psych negative neurological ROS  negative psych ROS   GI/Hepatic negative GI ROS, Neg liver ROS,   Endo/Other  negative endocrine ROS  Renal/GU negative Renal ROS  negative genitourinary   Musculoskeletal negative musculoskeletal ROS (+)   Abdominal   Peds negative pediatric ROS (+)  Hematology negative hematology ROS (+)   Anesthesia Other Findings   Reproductive/Obstetrics                           Anesthesia Physical Anesthesia Plan  ASA: III  Anesthesia Plan: General   Post-op Pain Management:    Induction: Intravenous  Airway Management Planned: Oral ETT  Additional Equipment:   Intra-op Plan:   Post-operative Plan: Extubation in OR  Informed Consent: I have reviewed the patients History and Physical, chart, labs and discussed the procedure including the risks, benefits and alternatives for the proposed anesthesia with the patient or authorized representative who has indicated his/her understanding and acceptance.   Dental advisory given  Plan Discussed with: CRNA  Anesthesia Plan Comments:         Anesthesia Quick Evaluation

## 2013-02-22 NOTE — Transfer of Care (Signed)
Immediate Anesthesia Transfer of Care Note  Patient: [name] [name]  Procedure(s) Performed: Procedure(s) with comments: ENDOSCOPIC RETROGRADE CHOLANGIOPANCREATOGRAPHY (ERCP) (N/A) SPYGLASS LITHOTRIPSY (N/A) - litho ord'd 7/8 by DL/PO [id number] [facility] SPYGLASS CHOLANGIOSCOPY (N/A)  Patient Location: PACU  Anesthesia Type:General  Level of Consciousness: awake, alert  and oriented  Airway & Oxygen Therapy: Patient Spontanous Breathing and Patient connected to face mask oxygen  Post-op Assessment: Report given to PACU RN and Post -op Vital signs reviewed and stable  Post vital signs: Reviewed and stable  Complications: No apparent anesthesia complications

## 2013-02-22 NOTE — Anesthesia Postprocedure Evaluation (Signed)
Anesthesia Post Note  Patient: [name] [name]  Procedure(s) Performed: Procedure(s) (LRB): ENDOSCOPIC RETROGRADE CHOLANGIOPANCREATOGRAPHY (ERCP) (N/A) SPYGLASS LITHOTRIPSY (N/A) SPYGLASS CHOLANGIOSCOPY (N/A)  Anesthesia type: General  Patient location: PACU  Post pain: Pain level controlled  Post assessment: Post-op Vital signs reviewed  Last Vitals:  Filed Vitals:   02/22/13 1415  BP: 139/77  Temp:   Resp: 15    Post vital signs: Reviewed  Level of consciousness: sedated  Complications: No apparent anesthesia complications

## 2013-02-22 NOTE — Preoperative (Signed)
Beta Blockers   Reason not to administer Beta Blockers:Not Applicable 

## 2013-02-22 NOTE — H&P (View-Only) (Signed)
History of Present Illness: 79 year old white male referred at the request of Dr. Redmond School for evaluation of abdominal pain. Over the past 5 weeks she's had 5 discrete episodes of severe upper epigastric pain. The first episode was the worst and lasted about 30 minutes. It reminded him of pain that he experienced when he had acute pancreatitis several years ago. Pain is without radiation. It is not accompanied by nausea or vomiting. He's had 4 subsequent episodes of pain that have been of shorter duration and less severe.  He underwent an acute abdominal series within a day of the initial episode that demonstrated a few air-fluid levels in the small bowel. CT scan, which I reviewed as well, demonstrated prominence of the intrahepatic ducts, common bile duct and pancreatic duct.  There is questionable circumferential thickening in the terminal ileum that may represent under distention. In addition, he complains of right groin pain that seems to be related to straining or twisting.  He complains of excess belching and flatus.    Past Medical History  Diagnosis Date  . MVP (mitral valve prolapse)     s/p Mitral Valve Repair  . CAD (coronary artery disease)     s/p CABG x 1  . HTN (hypertension)   . Hyperlipidemia   . DJD (degenerative joint disease)   . BPH (benign prostatic hyperplasia)   . Arthritis   . Lumbar disc disease   . Pancreatitis    Past Surgical History  Procedure Laterality Date  . Mitral valve repair  January 2002  . Coronary artery bypass graft  January 2002    Single bypass to the distal RCA  . Colonoscopy  ~2008    no polyps per pt.  Dr. [name]  . Cholecystectomy      Dr. Lennie Hummer  . Hernia repair  05/05/11    Lap L inguinal & obturator herniae, R Femoral Hernia   family history includes Stroke in his father and mother. Current Outpatient Prescriptions  Medication Sig Dispense Refill  . amLODipine (NORVASC) 5 MG tablet take 1 tablet by mouth once daily  30  tablet  5  . aspirin 325 MG EC tablet Take 325 mg by mouth daily.        . Cholecalciferol (VITAMIN D) 1000 UNITS capsule Take 1,000 Units by mouth daily.        . CRESTOR 20 MG tablet take 1 tablet by mouth once daily  30 tablet  5  . dutasteride (AVODART) 0.5 MG capsule Take 0.5 mg by mouth daily.        Marland Kitchen gabapentin (NEURONTIN) 300 MG capsule Take 300 mg by mouth as directed.       . multivitamin (THERAGRAN) per tablet Take 1 tablet by mouth daily.        . Tamsulosin HCl (FLOMAX) 0.4 MG CAPS Take 0.4 mg by mouth daily.        . valsartan-hydrochlorothiazide (DIOVAN-HCT) 320-12.5 MG per tablet Take 1 tablet by mouth daily.  30 tablet  5   No current facility-administered medications for this visit.   Allergies as of 01/31/2013  . (No Known Allergies)    reports that he has never smoked. He has never used smokeless tobacco. He reports that  drinks alcohol. He reports that he does not use illicit drugs.     Review of Systems: Pertinent positive and negative review of systems were noted in the above HPI section. All other review of systems were otherwise negative.  Vital  signs were reviewed in today's medical record Physical Exam: General: Well developed , well nourished, no acute distress Skin: anicteric Head: Normocephalic and atraumatic Eyes:  sclerae anicteric, EOMI Ears: Normal auditory acuity Mouth: No deformity or lesions Neck: Supple, no masses or thyromegaly Lungs: Clear throughout to auscultation Heart: Regular rate and rhythm; no murmurs, rubs or bruits Abdomen: Soft, non tender and non distended. No masses, hepatosplenomegaly or hernias noted. Normal Bowel sounds. He has diastases recti. In the right groin there is slight tenderness on palpation of the area of the femoral ligament. There is no obvious hernia. Rectal:deferred Musculoskeletal: Symmetrical with no gross deformities  Skin: No lesions on visible extremities Pulses:  Normal pulses noted Extremities: No  clubbing, cyanosis, edema or deformities noted Neurological: Alert oriented x 4, grossly nonfocal Cervical Nodes:  No significant cervical adenopathy Inguinal Nodes: No significant inguinal adenopathy Psychological:  Alert and cooperative. Normal mood and affect

## 2013-02-22 NOTE — H&P (Signed)
Primary Care Physician:  Wyatt Haste, MD Primary Gastroenterologist:  Dr. Deatra Ina  CHIEF COMPLAINT:  Post-ERCP abdominal pain  HPI: [name] [name] is a 79 y.o. male who underwent ERCP with sphincterotomy and stone extraction (multiple stones) earlier today.  He woke up from anesthesia with complaints of significant abdominal pain.  Abdominal x-ray was negative for perforation.     Past Medical History  Diagnosis Date  . MVP (mitral valve prolapse)     s/p Mitral Valve Repair  . CAD (coronary artery disease)     s/p CABG x 1  . HTN (hypertension)   . Hyperlipidemia   . DJD (degenerative joint disease)   . BPH (benign prostatic hyperplasia)   . Arthritis   . Lumbar disc disease   . Pancreatitis     Past Surgical History  Procedure Laterality Date  . Mitral valve repair  January 2002  . Coronary artery bypass graft  January 2002    Single bypass to the distal RCA  . Colonoscopy  ~2008    no polyps per pt.  Dr. [name]  . Cholecystectomy      Dr. Lennie Hummer  . Hernia repair  05/05/11    Lap L inguinal & obturator herniae, R Femoral Hernia    Prior to Admission medications   Medication Sig Start Date End Date Taking? Authorizing Provider  amLODipine (NORVASC) 5 MG tablet Take 5 mg by mouth every evening.   Yes Historical Provider, MD  aspirin 325 MG EC tablet Take 325 mg by mouth daily.     Yes Historical Provider, MD  Cholecalciferol (VITAMIN D) 1000 UNITS capsule Take 1,000 Units by mouth daily.     Yes Historical Provider, MD  dutasteride (AVODART) 0.5 MG capsule Take 0.5 mg by mouth daily.     Yes Historical Provider, MD  gabapentin (NEURONTIN) 300 MG capsule Take 300 mg by mouth 3 (three) times daily.   Yes Historical Provider, MD  rosuvastatin (CRESTOR) 20 MG tablet Take 20 mg by mouth at bedtime.   Yes Historical Provider, MD  Tamsulosin HCl (FLOMAX) 0.4 MG CAPS Take 0.4 mg by mouth daily.     Yes Historical Provider, MD  valsartan-hydrochlorothiazide  (DIOVAN-HCT) 320-12.5 MG per tablet Take 1 tablet by mouth every morning.   Yes Historical Provider, MD    Current Facility-Administered Medications  Medication Dose Route Frequency Provider Last Rate Last Dose  . 0.45 % NaCl with KCl 20 mEq / L infusion   Intravenous Continuous Jessica D. Zehr, PA-C      . 0.9 %  sodium chloride infusion   Intravenous Continuous [name], MD      . 0.9 %  sodium chloride infusion  250 mL Intravenous PRN Janett Billow D. Zehr, PA-C      . acetaminophen (TYLENOL) tablet 650 mg  650 mg Oral Q6H PRN Laban Emperor. Zehr, PA-C   650 mg at 02/22/13 1626   Or  . acetaminophen (TYLENOL) suppository 650 mg  650 mg Rectal Q6H PRN Laban Emperor. Zehr, PA-C      . amLODipine (NORVASC) tablet 5 mg  5 mg Oral QPM Jessica D. Zehr, PA-C      . atorvastatin (LIPITOR) tablet 40 mg  40 mg Oral q1800 Jessica D. Zehr, PA-C      . dutasteride (AVODART) capsule 0.5 mg  0.5 mg Oral Daily Jessica D. Zehr, PA-C      . gabapentin (NEURONTIN) capsule 300 mg  300 mg Oral TID Janett Billow  D. Zehr, PA-C      . Derrill Memo ON 02/23/2013] irbesartan (AVAPRO) tablet 300 mg  300 mg Oral Daily [name], MD       And  . Derrill Memo ON 02/23/2013] hydrochlorothiazide (MICROZIDE) capsule 12.5 mg  12.5 mg Oral Daily [name], MD      . HYDROcodone-acetaminophen (NORCO/VICODIN) 5-325 MG per tablet 1-2 tablet  1-2 tablet Oral Q4H PRN Laban Emperor. Zehr, PA-C      . HYDROmorphone (DILAUDID) injection 1 mg  1 mg Intravenous Q2H PRN Jessica D. Zehr, PA-C   1 mg at 02/22/13 1605  . lactated ringers infusion   Intravenous Continuous Ayesha Mohair, MD      . ondansetron [facility]) injection 4 mg  4 mg Intravenous Once [name], MD      . ondansetron [facility]) tablet 4 mg  4 mg Oral Q6H PRN Laban Emperor. Zehr, PA-C       Or  . ondansetron (ZOFRAN) injection 4 mg  4 mg Intravenous Q6H PRN Jessica D. Zehr, PA-C      . promethazine (PHENERGAN) injection 6.25-12.5 mg  6.25-12.5 mg Intravenous Q15 min PRN Ayesha Mohair, MD      . sodium chloride 0.9 % injection 3 mL  3 mL Intravenous Q12H Jessica D. Zehr, PA-C      . sodium chloride 0.9 % injection 3 mL  3 mL Intravenous Q12H Jessica D. Zehr, PA-C      . sodium chloride 0.9 % injection 3 mL  3 mL Intravenous PRN Jessica D. Zehr, PA-C      . tamsulosin (FLOMAX) capsule 0.4 mg  0.4 mg Oral Daily Jessica D. Zehr, PA-C        Allergies as of 02/14/2013  . (No Known Allergies)    Family History  Problem Relation Age of Onset  . Stroke Father   . Stroke Mother     TIA    History   Social History  . Marital Status: Married    Spouse Name: N/A    Number of Children: 2  . Years of Education: N/A   Occupational History  . Retired     Scientist, research (physical sciences)  .     Social History Main Topics  . Smoking status: Never Smoker   . Smokeless tobacco: Never Used  . Alcohol Use: 0.0 oz/week    1-2 Glasses of wine per week     Comment: rare alcohol use  . Drug Use: No  . Sexually Active: Not on file   Other Topics Concern  . Not on file   Social History Narrative  . No narrative on file    Review of Systems: Ten point ROS is O/W negative except as mentioned in HPI.  Physical Exam: Vital signs in last 24 hours: Temp:  [97.5 F (36.4 C)-97.9 F (36.6 C)] 97.6 F (36.4 C) (07/15 1615) Resp:  [10-47] 47 (07/15 1600) BP: (89-183)/(54-98) 168/78 mmHg (07/15 1600) SpO2:  [94 %-100 %] 94 % (07/15 1430) Weight:  [190 lb (86.183 kg)] 190 lb (86.183 kg) (07/15 1110)   General:   Alert, Well-developed, well-nourished, pleasant and cooperative in NAD Head:  Normocephalic and atraumatic. Eyes:  Sclera clear, no icterus.  Conjunctiva pink. Ears:  Normal auditory acuity. Mouth:  No deformity or lesions.  Dry mouth. Lungs:  Clear throughout to auscultation.  No wheezes, crackles, or rhonchi. No acute distress. Heart:  Regular rate and rhythm; no murmurs, clicks, rubs,  or gallops. Abdomen:  Soft, non-tender and non-distended.  BS present.   Rectal:   Deferred. Msk:  Symmetrical without gross deformities. Normal posture. Pulses:  Normal pulses noted. Extremities:  Without clubbing or edema. Neurologic:  Alert and  oriented x4;  grossly normal neurologically. Skin:  Intact without significant lesions or rashes. Psych:  Alert and cooperative. Normal mood and affect.  Studies/Results: Dg Ercp Biliary & Pancreatic Ducts  02/22/2013   *RADIOLOGY REPORT*  Clinical Data: Common bile duct stones.  ERCP  Comparison:  MRCP on 02/03/2013  Technique:  Multiple spot images obtained with the fluoroscopic device and submitted for interpretation post-procedure.  ERCP was performed by Dr. Deatra Ina.  Findings: Imaging by a C-arm during ERCP shows filling defects in a prominent common bile duct.  After sphincterotomy, multiple balloon sweep maneuvers were performed to extract calculi.  Completion cholangiography shows no definite residual filling defects.  IMPRESSION: ERCP and stone extraction for choledocholithiasis.  These images were submitted for radiologic interpretation only. Please see the procedural report for the amount of contrast and the fluoroscopy time utilized.   Original Report Authenticated By: Aletta Edouard, M.D.   Dg Abd 2 Views  02/22/2013   *RADIOLOGY REPORT*  Clinical Data: Abdominal pain.  Status post ERCP.  ABDOMEN - 2 VIEW  Comparison: 12/24/2012.  Findings: There is air throughout the small and large bowel, likely post procedural.  An ileus is possible.  No free air is identified. Pneumobilia is noted and not unexpected.  IMPRESSION: No findings for perforation.   Original Report Authenticated By: Marijo Sanes, M.D.    Impression / Plan:  -Likely post-ERCP pancreatitis:  Will admit the patient to telemetry.  Will give IVF's, anti-emetics, pain medications.  Will keep him NPO except for sips with meds.  Home medications restarted.  Will check amylase and lipase now with CMP and CBC in AM.     LOS: 0 days   ZEHR, JESSICA D.  02/22/2013,  4:50 PM  Chart was reviewed and patient was examined. X-rays and lab were reviewed.    I agree with management and plans.Strongly suspect post ERCP pancreatitis.  Plan supportive care, analgesics, antiemetics as above.  Sandy Salaam. Deatra Ina, M.D., [facility] Cell [phone]

## 2013-02-22 NOTE — Interval H&P Note (Signed)
History and Physical Interval Note:  02/22/2013 11:27 AM  [name]  has presented today for surgery, with the diagnosis of Gallstones [574.20]  The various methods of treatment have been discussed with the patient and family. After consideration of risks, benefits and other options for treatment, the patient has consented to  Procedure(s) with comments: ENDOSCOPIC RETROGRADE CHOLANGIOPANCREATOGRAPHY (ERCP) (N/A) SPYGLASS LITHOTRIPSY (N/A) - litho ord'd 7/8 by DL/PO [id number] [facility] SPYGLASS CHOLANGIOSCOPY (N/A) as a surgical intervention .  The patient's history has been reviewed, patient examined, no change in status, stable for surgery.  I have reviewed the patient's chart and labs.  Questions were answered to the patient's satisfaction.    The recent H&P (dated *01/31/13**) was reviewed, the patient was examined and there is no change in the patients condition since that H&P was completed.  MRCP demonstrated multiple bile duct stones.   Erskine Emery  02/22/2013, 11:28 AM    Erskine Emery

## 2013-02-22 NOTE — Op Note (Signed)
[facility] [place], [address]   ERCP PROCEDURE REPORT  PATIENT: [name], [name].  MR# :[id number] BIRTHDATE: 1930-03-07  GENDER: Male ENDOSCOPIST: [name], MD REFERRED BY: PROCEDURE DATE:  02/22/2013 PROCEDURE:   ERCP with sphincterotomy/papillotomy and Direct Cholangioscopy ASA CLASS:   Class III INDICATIONS:suspected or rule out bile duct stones. MEDICATIONS: MAC sedation, administered by CRNA and Cipro 400 mg IV  TOPICAL ANESTHETIC:  DESCRIPTION OF PROCEDURE:   After the risks benefits and alternatives of the procedure were thoroughly explained, informed consent was obtained.  The     endoscope was introduced through the mouth  and advanced to the third portion of the duodenum .  A small periampullary diverticulum was seen. The common bile duct was directly cannulated with a 0.35 mm guidewire.  Injection demonstrated a massively dilated duct with at least one filling defect in the distal duct.  A 15 mm sphincterotomy was made.  Following this the cholangioscope (spy glass) was inserted over wire up to the level of the bifurcation. Direct visualization of the bile duct demonstrated a few stone fragments.  Distal bile duct was not well visualized.  After withdrawing the cholangioscope a 15 mm balloon stone extractor was inserted initially into the midportion the duct.  The entire duct was swept multiple times with a 15 mm stone extractor and multiple stones were extracted measuring from 10-15 mm and perhaps as large as 20 mm.  This was repeated several times until no more stones or fragments were seen.  Cholangiogram  was normal with exception of diffuse dilatation. The scope was then completely withdrawn from the patient and the procedure terminated.     COMPLICATIONS:  ENDOSCOPIC IMPRESSION: Multiple large CBD stones - s/p sphincterotomy and stone extraction   RECOMMENDATIONS: OV 3-4  weeks    _______________________________ eSigned:  [name], MD 02/22/2013 1:38 PM   CC:  PATIENT NAME:  [name], [name] MR#: [id number]

## 2013-02-23 ENCOUNTER — Encounter (HOSPITAL_COMMUNITY): Payer: Self-pay | Admitting: Gastroenterology

## 2013-02-23 DIAGNOSIS — K8050 Calculus of bile duct without cholangitis or cholecystitis without obstruction: Principal | ICD-10-CM

## 2013-02-23 LAB — COMPREHENSIVE METABOLIC PANEL
ALT: 134 U/L — ABNORMAL HIGH (ref 0–53)
Alkaline Phosphatase: 244 U/L — ABNORMAL HIGH (ref 39–117)
CO2: 27 mEq/L (ref 19–32)
Chloride: 100 mEq/L (ref 96–112)
GFR calc Af Amer: 62 mL/min — ABNORMAL LOW (ref >90)
GFR calc non Af Amer: 54 mL/min — ABNORMAL LOW (ref >90)
Glucose, Bld: 121 mg/dL — ABNORMAL HIGH (ref 70–99)
Potassium: 3.6 mEq/L (ref 3.5–5.1)
Sodium: 137 mEq/L (ref 135–145)

## 2013-02-23 LAB — CBC
Hemoglobin: 12.5 g/dL — ABNORMAL LOW (ref 13.0–17.0)
RBC: 4.27 MIL/uL (ref 4.22–5.81)

## 2013-02-23 NOTE — Progress Notes (Signed)
Patient complained of bladder fullness. After voiding only 100cc, bladder scan revealed 700cc in bladder. Patient very uncomfortable.  PAc on call notified. Orders for foley placed. Foley placed, urine returned. No complications. Patient felt much better. Will continue to monitor patient. Setzer, Marchelle Folks

## 2013-02-23 NOTE — Progress Notes (Signed)
[facility] Progress Note  Subjective:  Nurse called first thing this AM and said that patient was having trouble voiding.  Bladder scan showed 600-700 cc.  Otherwise his upper abdominal pain is doing better and he is trying to avoid pain meds.  Objective:  Vital signs in last 24 hours: Temp:  [97.4 F (36.3 C)-98.1 F (36.7 C)] 98.1 F (36.7 C) (07/16 0520) Pulse Rate:  [62-70] 67 (07/16 0520) Resp:  [10-47] 20 (07/16 0520) BP: (89-183)/(54-98) 145/75 mmHg (07/16 0520) SpO2:  [91 %-100 %] 92 % (07/16 0520) Weight:  [190 lb (86.183 kg)] 190 lb (86.183 kg) (07/15 1110) Last BM Date: 02/22/13 General:   Alert, Well-developed, in NAD Heart:  Regular rate and rhythm; no murmurs Pulm:  CTAB.  No W/R/R. Abdomen:  Soft, non-distended.  BS present.  Mild upper abdominal TTP without R/R/G.  Also sore over suprapubic region. Extremities:  Without edema. Neurologic:  Alert and  oriented x4;  grossly normal neurologically. Psych:  Alert and cooperative. Normal mood and affect.  Intake/Output from previous day: 07/15 0701 - 07/16 0700 In: 1225 [P.O.:80; I.V.:1145] Out: 375 [Urine:375] Intake/Output this shift: Total I/O In: 240 [I.V.:240] Out: 75 [Urine:75]  Lab Results:  Recent Labs  02/23/13 0510  WBC 15.1*  HGB 12.5*  HCT 38.0*  PLT 196   BMET  Recent Labs  02/23/13 0510  NA 137  K 3.6  CL 100  CO2 27  GLUCOSE 121*  BUN 16  CREATININE 1.23  CALCIUM 8.5   LFT  Recent Labs  02/23/13 0510  PROT 6.4  ALBUMIN 3.2*  AST 164*  ALT 134*  ALKPHOS 244*  BILITOT 1.4*   Dg Ercp Biliary & Pancreatic Ducts  02/22/2013   *RADIOLOGY REPORT*  Clinical Data: Common bile duct stones.  ERCP  Comparison:  MRCP on 02/03/2013  Technique:  Multiple spot images obtained with the fluoroscopic device and submitted for interpretation post-procedure.  ERCP was performed by Dr. Deatra Ina.  Findings: Imaging by a C-arm during ERCP shows filling defects in a prominent common  bile duct.  After sphincterotomy, multiple balloon sweep maneuvers were performed to extract calculi.  Completion cholangiography shows no definite residual filling defects.  IMPRESSION: ERCP and stone extraction for choledocholithiasis.  These images were submitted for radiologic interpretation only. Please see the procedural report for the amount of contrast and the fluoroscopy time utilized.   Original Report Authenticated By: Aletta Edouard, M.D.   Dg Abd 2 Views  02/22/2013   *RADIOLOGY REPORT*  Clinical Data: Abdominal pain.  Status post ERCP.  ABDOMEN - 2 VIEW  Comparison: 12/24/2012.  Findings: There is air throughout the small and large bowel, likely post procedural.  An ileus is possible.  No free air is identified. Pneumobilia is noted and not unexpected.  IMPRESSION: No findings for perforation.   Original Report Authenticated By: Marijo Sanes, M.D.    Assessment / Plan: -Abdominal pain post-ERCP with sphincterotomy and balloon extraction of several stones.  Suspected pancreatitis, however, enzymes are normal.  Will give clear liquid diet.  Recheck CBC and hepatic function in AM. -Urinary retention:  Will order follow catheter.   LOS: 1 day   ZEHR, JESSICA D.  02/23/2013, 8:47 AM  Pager number [phone]  I have personally taken an interval history, reviewed the chart, and examined the patient.  I agree with the extender's note, impression and recommendations.  He is significantly improved, not having any abdominal pain to speak of. Despite absence of pancreatic enzyme elevation  I suspect he had, at least, transient pancreatic pain. Will rapidly advance diet in a.m. and discontinue Foley. Should he tolerate a regular diet and be able to urinate he can hopefully be discharged later in the day.  Sandy Salaam. Deatra Ina, MD, [facility] [phone]

## 2013-02-24 DIAGNOSIS — R339 Retention of urine, unspecified: Secondary | ICD-10-CM

## 2013-02-24 LAB — HEPATIC FUNCTION PANEL
Bilirubin, Direct: 0.5 mg/dL — ABNORMAL HIGH (ref 0.0–0.3)
Indirect Bilirubin: 0.9 mg/dL (ref 0.3–0.9)
Total Bilirubin: 1.4 mg/dL — ABNORMAL HIGH (ref 0.3–1.2)

## 2013-02-24 LAB — CBC
MCH: 30.5 pg (ref 26.0–34.0)
MCV: 89 fL (ref 78.0–100.0)
Platelets: 185 10*3/uL (ref 150–400)
RDW: 13.6 % (ref 11.5–15.5)

## 2013-02-24 NOTE — Progress Notes (Signed)
[facility] Progress Note  Subjective:  Feels well.  Tolerating clear liquids.  Would like to go home today if possible.  Objective:  Vital signs in last 24 hours: Temp:  [98 F (36.7 C)-98.7 F (37.1 C)] 98.7 F (37.1 C) (07/17 0514) Pulse Rate:  [57-58] 57 (07/17 0514) Resp:  [16-20] 16 (07/17 0514) BP: (137-153)/(58-73) 153/58 mmHg (07/17 0514) SpO2:  [92 %-97 %] 92 % (07/17 0514) Last BM Date: 02/22/13 General:   Alert, Well-developed, in NAD Heart:  Regular rate and rhythm; no murmurs Pulm:  CTAB.  No W/R/R. Abdomen:  Soft, nontender and nondistended. Normal bowel sounds, without guarding, and without rebound.   Extremities:  Without edema. Neurologic:  Alert and  oriented x4;  grossly normal neurologically. Psych:  Alert and cooperative. Normal mood and affect.  Intake/Output from previous day: 07/16 0701 - 07/17 0700 In: 3100 [P.O.:600; I.V.:2500] Out: 8592 [Urine:5125]  Lab Results:  Recent Labs  02/23/13 0510 02/24/13 0428  WBC 15.1* 9.5  HGB 12.5* 12.5*  HCT 38.0* 36.5*  PLT 196 185   BMET  Recent Labs  02/23/13 0510  NA 137  K 3.6  CL 100  CO2 27  GLUCOSE 121*  BUN 16  CREATININE 1.23  CALCIUM 8.5   LFT  Recent Labs  02/24/13 0428  PROT 6.0  ALBUMIN 2.9*  AST 67*  ALT 77*  ALKPHOS 177*  BILITOT 1.4*  BILIDIR 0.5*  IBILI 0.9   Dg Ercp Biliary & Pancreatic Ducts  02/22/2013   *RADIOLOGY REPORT*  Clinical Data: Common bile duct stones.  ERCP  Comparison:  MRCP on 02/03/2013  Technique:  Multiple spot images obtained with the fluoroscopic device and submitted for interpretation post-procedure.  ERCP was performed by Dr. Deatra Ina.  Findings: Imaging by a C-arm during ERCP shows filling defects in a prominent common bile duct.  After sphincterotomy, multiple balloon sweep maneuvers were performed to extract calculi.  Completion cholangiography shows no definite residual filling defects.  IMPRESSION: ERCP and stone extraction for  choledocholithiasis.  These images were submitted for radiologic interpretation only. Please see the procedural report for the amount of contrast and the fluoroscopy time utilized.   Original Report Authenticated By: Aletta Edouard, M.D.   Dg Abd 2 Views  02/22/2013   *RADIOLOGY REPORT*  Clinical Data: Abdominal pain.  Status post ERCP.  ABDOMEN - 2 VIEW  Comparison: 12/24/2012.  Findings: There is air throughout the small and large bowel, likely post procedural.  An ileus is possible.  No free air is identified. Pneumobilia is noted and not unexpected.  IMPRESSION: No findings for perforation.   Original Report Authenticated By: Marijo Sanes, M.D.    Assessment / Plan: -Abdominal pain post-ERCP with sphincterotomy and balloon extraction of several stones. Suspected pancreatitis, however, enzymes are normal. Will advance diet to low fat.   -Urinary retention: Will have nursing remove foley.  *If patient tolerates regular diet and can urinate once foley is removed, but will be discharged later today.    LOS: 2 days   ZEHR, JESSICA D.  02/24/2013, 8:42 AM  Pager number [phone]  I have personally taken an interval history, reviewed the chart, and examined the patient.  I agree with the extender's note, impression and recommendations.  Sandy Salaam. Deatra Ina, MD, [facility] [phone]

## 2013-02-24 NOTE — Discharge Summary (Signed)
[facility] Discharge Summary  Name: [name] [name] MRN: [id number] DOB: 1929-10-20 79 y.o. PCP:  Wyatt Haste, MD  Date of Admission: 02/22/2013 10:45 AM Date of Discharge: 02/24/2013 Attending Physician: [name], MD  Discharge Diagnosis: Active Problems:   Calculus of bile duct without mention of cholecystitis or obstruction   Urinary retention   Consultations:  None  Procedures Performed:  Mr 3d Recon At Scanner  02/04/2013   **ADDENDUM** CREATED: 02/04/2013 08:26:05  These results will be called to the ordering clinician or representative by the Radiologist Assistant, and communication documented in the PACS Dashboard.  **END ADDENDUM** SIGNED BY: Suzy Bouchard, M.D.  02/04/2013   *RADIOLOGY REPORT*  Clinical Data:  Abdominal pain.  History of cholecystectomy. Concern for common bile duct stone.  MRI ABDOMEN WITHOUT AND WITH CONTRAST (MRCP)  Technique:  Multiplanar multisequence MR imaging of the abdomen was performed without and with contrast, including heavily T2-weighted images of the biliary and pancreatic ducts.  Three-dimensional MR images were rendered by post processing of the original MR data.  Contrast: 31m MULTIHANCE GADOBENATE DIMEGLUMINE 529 MG/ML IV SOLN  Comparison:  CT 01/17/2013  Findings:  Mild intrahepatic and significant extrahepatic biliary duct dilatation.  The common bile duct measures 12 mm.  Within the distal common bile duct there are at least four round filling calculi dependently within the duct measuring approximately 6 mm each.  There is no significant pancreatic ductal dilatation.  No pancreatic parenchymal lesion.  The liver has a nonenhancing cyst within the left lateral hepatic lobe. Post cholecystectomy.  The spleen, adrenal glands are normal.  There are bilateral nonenhancing cyst within the kidneys.  There are peripelvic cysts additionally.  Limited view of the stomach and bowel are unremarkable. Degenerative change of lumbar  spine noted.  IMPRESSION:  1.  At least four calculi within the common bile duct measuring approximately 6 mm each. 2.  Biliary ductal dilatation with mild intrahepatic biliary dilatation and moderate extrahepatic biliary duct dilatation. Post cholecystectomy.  3.  Benign cystic lesion in the left hepatic lobe. 4.  Benign cystic lesions of the kidneys.   Original Report Authenticated By: SSuzy Bouchard M.D.   Dg Ercp Biliary & Pancreatic Ducts  02/22/2013   *RADIOLOGY REPORT*  Clinical Data: Common bile duct stones.  ERCP  Comparison:  MRCP on 02/03/2013  Technique:  Multiple spot images obtained with the fluoroscopic device and submitted for interpretation post-procedure.  ERCP was performed by Dr. KDeatra Ina  Findings: Imaging by a C-arm during ERCP shows filling defects in a prominent common bile duct.  After sphincterotomy, multiple balloon sweep maneuvers were performed to extract calculi.  Completion cholangiography shows no definite residual filling defects.  IMPRESSION: ERCP and stone extraction for choledocholithiasis.  These images were submitted for radiologic interpretation only. Please see the procedural report for the amount of contrast and the fluoroscopy time utilized.   Original Report Authenticated By: GAletta Edouard M.D.   Dg Abd 2 Views  02/22/2013   *RADIOLOGY REPORT*  Clinical Data: Abdominal pain.  Status post ERCP.  ABDOMEN - 2 VIEW  Comparison: 12/24/2012.  Findings: There is air throughout the small and large bowel, likely post procedural.  An ileus is possible.  No free air is identified. Pneumobilia is noted and not unexpected.  IMPRESSION: No findings for perforation.   Original Report Authenticated By: PMarijo Sanes M.D.   Mr ALambert ModyCm/mrcp  02/04/2013   **ADDENDUM** CREATED: 02/04/2013 08:26:05  These results will be called to the  ordering clinician or representative by the Radiologist Assistant, and communication documented in the PACS Dashboard.  **END ADDENDUM** SIGNED BY:  Suzy Bouchard, M.D.  02/04/2013   *RADIOLOGY REPORT*  Clinical Data:  Abdominal pain.  History of cholecystectomy. Concern for common bile duct stone.  MRI ABDOMEN WITHOUT AND WITH CONTRAST (MRCP)  Technique:  Multiplanar multisequence MR imaging of the abdomen was performed without and with contrast, including heavily T2-weighted images of the biliary and pancreatic ducts.  Three-dimensional MR images were rendered by post processing of the original MR data.  Contrast: 66m MULTIHANCE GADOBENATE DIMEGLUMINE 529 MG/ML IV SOLN  Comparison:  CT 01/17/2013  Findings:  Mild intrahepatic and significant extrahepatic biliary duct dilatation.  The common bile duct measures 12 mm.  Within the distal common bile duct there are at least four round filling calculi dependently within the duct measuring approximately 6 mm each.  There is no significant pancreatic ductal dilatation.  No pancreatic parenchymal lesion.  The liver has a nonenhancing cyst within the left lateral hepatic lobe. Post cholecystectomy.  The spleen, adrenal glands are normal.  There are bilateral nonenhancing cyst within the kidneys.  There are peripelvic cysts additionally.  Limited view of the stomach and bowel are unremarkable. Degenerative change of lumbar spine noted.  IMPRESSION:  1.  At least four calculi within the common bile duct measuring approximately 6 mm each. 2.  Biliary ductal dilatation with mild intrahepatic biliary dilatation and moderate extrahepatic biliary duct dilatation. Post cholecystectomy.  3.  Benign cystic lesion in the left hepatic lobe. 4.  Benign cystic lesions of the kidneys.   Original Report Authenticated By: SSuzy Bouchard M.D.    GI Procedures: ERCP with sphincterotomy and stone extraction by Dr. KDeatra Inaon 7/15.  History/Physical Exam:  See Admission H&P  Admission HPI: Patient underwent ERCP with sphincterotomy and stone extraction by Dr. KDeatra Inaon 7/15.  He had several stone removed and immediately  post-procedure was having a lot of abdominal pain.  Was admitted for suspected post-ERCP pancreatitis.  Amylase and lipase were normal and remained normal the following morning.  Home meds were restarted.  Patient's pain quickly resolved and his diet was slowly advanced.  He was tolerating a solid diet on the day of discharge.  He also had issues with urinary retention.  Bladder scan showed 600-700 cc's in the bladder and patient could not void.  Foley was placed on 7/16.  Foley was removed 7/17 for trial of voiding.  Patient was able to void small amounts.  Decision was made to remain without Foley and follow-up with urology tomorrow AM.  Appointment was made.  Patient has been followed by Dr. BAlinda Moneyin the past for BPH, etc.   Discharge Vitals:  BP 153/58  Pulse 57  Temp(Src) 98.7 F (37.1 C) (Oral)  Resp 16  Ht 6' 2"  (1.88 m)  Wt 190 lb (86.183 kg)  BMI 24.38 kg/m2  SpO2 92%  Discharge Labs:  Results for orders placed during the hospital encounter of 02/22/13 (from the past 24 hour(s))  CBC     Status: Abnormal   Collection Time    02/24/13  4:28 AM      Result Value Range   WBC 9.5  4.0 - 10.5 K/uL   RBC 4.10 (*) 4.22 - 5.81 MIL/uL   Hemoglobin 12.5 (*) 13.0 - 17.0 g/dL   HCT 36.5 (*) 39.0 - 52.0 %   MCV 89.0  78.0 - 100.0 fL   MCH 30.5  26.0 -  34.0 pg   MCHC 34.2  30.0 - 36.0 g/dL   RDW 13.6  11.5 - 15.5 %   Platelets 185  150 - 400 K/uL  HEPATIC FUNCTION PANEL     Status: Abnormal   Collection Time    02/24/13  4:28 AM      Result Value Range   Total Protein 6.0  6.0 - 8.3 g/dL   Albumin 2.9 (*) 3.5 - 5.2 g/dL   AST 67 (*) 0 - 37 U/L   ALT 77 (*) 0 - 53 U/L   Alkaline Phosphatase 177 (*) 39 - 117 U/L   Total Bilirubin 1.4 (*) 0.3 - 1.2 mg/dL   Bilirubin, Direct 0.5 (*) 0.0 - 0.3 mg/dL   Indirect Bilirubin 0.9  0.3 - 0.9 mg/dL    Disposition and follow-up:   Mr.Stefan DURANTE VIOLETT was discharged from [facility] in stable condition.    Follow-up  Appointments:     Discharge Orders   Future Appointments Provider Department Dept Phone   03/24/2013 9:45 AM [name], MD [facility] [phone]   Future Orders Complete By Expires     Activity as tolerated - No restrictions  As directed     Call MD for:  temperature >100.5  As directed     Discharge instructions  As directed     Comments:      Follow-up with urology tomorrow AM.    Resume previous diet  As directed        Discharge Medications:   Medication List         amLODipine 5 MG tablet  Commonly known as:  NORVASC  Take 5 mg by mouth every evening.     aspirin 325 MG EC tablet  Take 325 mg by mouth daily.     dutasteride 0.5 MG capsule  Commonly known as:  AVODART  Take 0.5 mg by mouth daily.     FLOMAX 0.4 MG Caps  Generic drug:  tamsulosin  Take 0.4 mg by mouth daily.     gabapentin 300 MG capsule  Commonly known as:  NEURONTIN  Take 300 mg by mouth 3 (three) times daily.     rosuvastatin 20 MG tablet  Commonly known as:  CRESTOR  Take 20 mg by mouth at bedtime.     valsartan-hydrochlorothiazide 320-12.5 MG per tablet  Commonly known as:  DIOVAN-HCT  Take 1 tablet by mouth every morning.     Vitamin D 1000 UNITS capsule  Take 1,000 Units by mouth daily.        SignedMyrtice Lauth, JESSICA D. 02/24/2013, 3:28 PM   I have personally taken an interval history, reviewed the chart, and examined the patient.  I agree with the extender's note, impression and recommendations.  Sandy Salaam. Deatra Ina, MD, [facility] [phone]

## 2013-03-07 ENCOUNTER — Encounter: Payer: Self-pay | Admitting: Cardiology

## 2013-03-24 ENCOUNTER — Encounter: Payer: Self-pay | Admitting: Gastroenterology

## 2013-03-24 ENCOUNTER — Ambulatory Visit (INDEPENDENT_AMBULATORY_CARE_PROVIDER_SITE_OTHER): Payer: Medicare Other | Admitting: Gastroenterology

## 2013-03-24 VITALS — BP 140/90 | HR 76 | Ht 74.0 in | Wt 188.0 lb

## 2013-03-24 DIAGNOSIS — K8050 Calculus of bile duct without cholangitis or cholecystitis without obstruction: Secondary | ICD-10-CM

## 2013-03-24 DIAGNOSIS — R198 Other specified symptoms and signs involving the digestive system and abdomen: Secondary | ICD-10-CM

## 2013-03-24 DIAGNOSIS — R194 Change in bowel habit: Secondary | ICD-10-CM | POA: Insufficient documentation

## 2013-03-24 DIAGNOSIS — R197 Diarrhea, unspecified: Secondary | ICD-10-CM

## 2013-03-24 NOTE — Assessment & Plan Note (Addendum)
There is been a notable change in bowel habits since his procedure. He did receive one course of antibiotics prior to the procedure raises question of pseudomembranous colitis.  Alternatively, enhanced bile flow could cause a cholorrhetic diarrhea.  Recommendations #1 check stool for C. difficile toxin.  If negative, I will start him on a course of cholestyramine

## 2013-03-24 NOTE — Patient Instructions (Addendum)
You to to go to the basement today for stool studies

## 2013-03-24 NOTE — Progress Notes (Signed)
History of Present Illness:  [name] has returned following sphincterotomy and extraction of multiple large bile duct stones.  He no longer has abdominal pain.  His main complaint is change in bowel habits.  He has loose stools and excess gas with occasional urgency.  This has all occurred since his procedure.  He denies rectal bleeding.  He continues to complain of groin pain.  There is no obvious hernia.    Review of Systems: Pertinent positive and negative review of systems were noted in the above HPI section. All other review of systems were otherwise negative.    Current Medications, Allergies, Past Medical History, Past Surgical History, Family History and Social History were reviewed in [place] record  Vital signs were reviewed in today's medical record. Physical Exam: General: Well developed , well nourished, no acute distress

## 2013-03-25 ENCOUNTER — Ambulatory Visit (INDEPENDENT_AMBULATORY_CARE_PROVIDER_SITE_OTHER): Payer: Medicare Other | Admitting: Family Medicine

## 2013-03-25 ENCOUNTER — Encounter: Payer: Self-pay | Admitting: Family Medicine

## 2013-03-25 ENCOUNTER — Other Ambulatory Visit: Payer: Self-pay

## 2013-03-25 ENCOUNTER — Ambulatory Visit
Admission: RE | Admit: 2013-03-25 | Discharge: 2013-03-25 | Disposition: A | Discharge status: Home / Self Care | Payer: Medicare Other | Source: Ambulatory Visit | Attending: Family Medicine | Admitting: Family Medicine

## 2013-03-25 VITALS — BP 148/90 | HR 70 | Wt 187.0 lb

## 2013-03-25 DIAGNOSIS — M76899 Other specified enthesopathies of unspecified lower limb, excluding foot: Secondary | ICD-10-CM

## 2013-03-25 DIAGNOSIS — M7062 Trochanteric bursitis, left hip: Secondary | ICD-10-CM

## 2013-03-25 DIAGNOSIS — R197 Diarrhea, unspecified: Secondary | ICD-10-CM

## 2013-03-25 DIAGNOSIS — M6580 Other synovitis and tenosynovitis, unspecified site: Secondary | ICD-10-CM

## 2013-03-25 NOTE — Progress Notes (Signed)
  Subjective:    Patient ID: [name] [name], male    DOB: 1930/05/30, 79 y.o.   MRN: [id number]  HPI He has a 2 month history of right inguinal pain. He has been slowly getting worse. No history of injury to that area. He notes difficulty with abduction as well as abduction of his leg. He has trouble getting in and out of cars as well as getting in and out of bed. At the end of the encounter he then mentioned the fact that he is having bilateral hip pain mainly when he lays down at night whether on the left to the right. It tends to wake him up interfering with his sleep. He cannot sleep flat on his back. He has no difficulty with hip pain during the day.   Review of Systems     Objective:   Physical Exam Alert and in no distress. Exam of the right inguinal area does show some slight tenderness to palpation over the insertion of the adductors. No masses are palpable. Skin is normal. Exam of both greater trochanters shows no real tenderness to palpation with good hip motion. X-ray of his pelvis was negative.       Assessment & Plan:  Adductor tendinitis - Plan: DG Pelvis 1-2 Views  Trochanteric bursitis of both hips  I will discuss further care of his adductors at a later date. Recommend he return here next week for bursa injection to see if that will help with his hip pain and therefore sleep.

## 2013-03-29 ENCOUNTER — Ambulatory Visit (INDEPENDENT_AMBULATORY_CARE_PROVIDER_SITE_OTHER): Payer: Medicare Other | Admitting: Family Medicine

## 2013-03-29 ENCOUNTER — Encounter: Payer: Self-pay | Admitting: Family Medicine

## 2013-03-29 VITALS — BP 122/70 | HR 66 | Wt 186.0 lb

## 2013-03-29 DIAGNOSIS — M25559 Pain in unspecified hip: Secondary | ICD-10-CM

## 2013-03-29 DIAGNOSIS — M25551 Pain in right hip: Secondary | ICD-10-CM

## 2013-03-29 NOTE — Progress Notes (Signed)
  Subjective:    Patient ID: [name] [name], male    DOB: [birth year], 79 y.o.   MRN: [id number]  HPI He has a several month history of bilateral hip pain that bothers him mainly at night. He can lay on either side for a couple of hours before the pain wakes him up. He then switch to the other side and to do well for several hours and then that side will start to hurt He has no trouble during the day with hip pain. He has tried using pillows but still has difficulty with the pain thenly when he goes to bed at night. He does have a previous history of SI joint dysfunction. He also continues to have right inguinal pain. The pain does seem to be somewhat worse with physical activities. Recent x-ray of the hip did show some arthritic changes.   Review of Systems     Objective:   Physical Exam Alert and in no distress. No tenderness to palpation over either greater trochanter. Hip motion was fairly good.      Assessment & Plan:  Hip pain, bilateral  I explained that I thought it might be worthwhile to do injections into the intertrochanteric bursa to see if this will help with his symptoms since they do seem to be mechanical in nature. Also discussed this could be referred pain possibly from hip or SI joint although unlikely since SI joint pain  is mainly on the right side. We discussed the possibility of him getting injections into the SI joint and possibly into the right hip since there are some arthritic changes. He will discuss this further with Dr. Nelva Bush. Both hips were prepped with Betadine. 40 mg of Kenalog and 3 cc of Xylocaine was injected into both trochanteric versus without difficulty. He tolerated the procedure well.

## 2013-04-04 ENCOUNTER — Telehealth: Payer: Self-pay | Admitting: Gastroenterology

## 2013-04-04 NOTE — Telephone Encounter (Signed)
Pt called for C. Diff results. Reviewed with pt that he was negative for c diff. Pt instructed to call if he did not continue to improve, he verbalized understanding.

## 2013-04-20 ENCOUNTER — Telehealth: Payer: Self-pay | Admitting: Gastroenterology

## 2013-04-20 NOTE — Telephone Encounter (Signed)
Patient states he continues to have lots of gas. He is now having "accidents" of stool with gas. Reports urgency to get to bathroom to have gas and passing small amount of stool. Has occassional stomach "discomfort." He also reports he has had [facility] Scheduled with Dr. Deatra Ina tomorrow at 9:15 AM.

## 2013-04-21 ENCOUNTER — Other Ambulatory Visit (INDEPENDENT_AMBULATORY_CARE_PROVIDER_SITE_OTHER): Payer: Medicare Other

## 2013-04-21 ENCOUNTER — Encounter: Payer: Self-pay | Admitting: Gastroenterology

## 2013-04-21 ENCOUNTER — Ambulatory Visit (INDEPENDENT_AMBULATORY_CARE_PROVIDER_SITE_OTHER): Payer: Medicare Other | Admitting: Gastroenterology

## 2013-04-21 VITALS — BP 130/70 | HR 64 | Ht 74.0 in | Wt 184.2 lb

## 2013-04-21 DIAGNOSIS — R194 Change in bowel habit: Secondary | ICD-10-CM

## 2013-04-21 DIAGNOSIS — R634 Abnormal weight loss: Secondary | ICD-10-CM

## 2013-04-21 DIAGNOSIS — R198 Other specified symptoms and signs involving the digestive system and abdomen: Secondary | ICD-10-CM

## 2013-04-21 LAB — CBC WITH DIFFERENTIAL/PLATELET
Basophils Absolute: 0 K/uL (ref 0.0–0.1)
Basophils Relative: 0.4 % (ref 0.0–3.0)
Eosinophils Absolute: 0.2 K/uL (ref 0.0–0.7)
Eosinophils Relative: 2.1 % (ref 0.0–5.0)
HCT: 40.3 % (ref 39.0–52.0)
Hemoglobin: 13.3 g/dL (ref 13.0–17.0)
Lymphocytes Relative: 15.2 % (ref 12.0–46.0)
Lymphs Abs: 1.3 K/uL (ref 0.7–4.0)
MCHC: 33 g/dL (ref 30.0–36.0)
MCV: 88.6 fl (ref 78.0–100.0)
Monocytes Absolute: 0.7 K/uL (ref 0.1–1.0)
Monocytes Relative: 7.8 % (ref 3.0–12.0)
Neutro Abs: 6.4 K/uL (ref 1.4–7.7)
Neutrophils Relative %: 74.5 % (ref 43.0–77.0)
Platelets: 224 K/uL (ref 150.0–400.0)
RBC: 4.56 Mil/uL (ref 4.22–5.81)
RDW: 14.8 % — ABNORMAL HIGH (ref 11.5–14.6)
WBC: 8.6 K/uL (ref 4.5–10.5)

## 2013-04-21 LAB — COMPREHENSIVE METABOLIC PANEL WITH GFR
ALT: 12 U/L (ref 0–53)
AST: 22 U/L (ref 0–37)
Albumin: 3.5 g/dL (ref 3.5–5.2)
Alkaline Phosphatase: 65 U/L (ref 39–117)
BUN: 22 mg/dL (ref 6–23)
CO2: 28 meq/L (ref 19–32)
Calcium: 8.8 mg/dL (ref 8.4–10.5)
Chloride: 103 meq/L (ref 96–112)
Creatinine, Ser: 1 mg/dL (ref 0.4–1.5)
GFR: 80.88 mL/min (ref >60.00)
Glucose, Bld: 78 mg/dL (ref 70–99)
Potassium: 3.5 meq/L (ref 3.5–5.1)
Sodium: 138 meq/L (ref 135–145)
Total Bilirubin: 0.8 mg/dL (ref 0.3–1.2)
Total Protein: 7 g/dL (ref 6.0–8.3)

## 2013-04-21 MED ORDER — CHOLESTYRAMINE LIGHT 4 G PO PACK: PACK | ORAL | Status: DC

## 2013-04-21 MED ORDER — ALIGN PO CAPS: 1.0000 | ORAL_CAPSULE | Freq: Every day | ORAL | Status: DC

## 2013-04-21 NOTE — Progress Notes (Signed)
History of Present Illness:  [name] [name] continues to complain of excess gas.  He no longer has diarrhea but is only passing small stools.  This is in marked contrast to a daily solid bowel movement that he had prior to ERCP.  Stool C. difficile toxin was negative.  He's lost about 10 pounds in the last 6 months.    Review of Systems: Pertinent positive and negative review of systems were noted in the above HPI section. All other review of systems were otherwise negative.    Current Medications, Allergies, Past Medical History, Past Surgical History, Family History and Social History were reviewed in [name] record  Vital signs were reviewed in today's medical record. Physical Exam: General: Well developed , well nourished, no acute distress Abdomen is without masses, tenderness organomegaly

## 2013-04-21 NOTE — Patient Instructions (Addendum)
Your physician has requested that you go to the basement for the following lab work before leaving today:  CBC, CMET  Dr. Deatra Ina is send a prescription for Questran to your pharmacy.   Please begin to take the probiotic Align, which you can obtain over the counter.  This puts good bacteria back into your colon. You should take 1 capsule by mouth once daily.   Please follow up with Dr. Deatra Ina in 2 weeks

## 2013-04-21 NOTE — Assessment & Plan Note (Addendum)
Patient has had a 10 pound weight loss in the past 6 months of unclear etiology.  This may be related to his change of bowel habits.  Medications #1 comprehensive metabolic profile

## 2013-04-21 NOTE — Assessment & Plan Note (Signed)
C. difficile toxin is negative.  I suspect that alterations in bowel habits are 2 to bile flow.  Altered bowel flora may also be contributing.  Recommendations #1 begin align one tab daily #2 cholestyramine one packet one to 2 times a day

## 2013-04-25 ENCOUNTER — Telehealth: Payer: Self-pay | Admitting: Gastroenterology

## 2013-04-25 NOTE — Telephone Encounter (Signed)
Pts ov appt moved to 05/12/13@10 :30am. Pt aware of appt date and time.

## 2013-04-30 ENCOUNTER — Other Ambulatory Visit: Payer: Self-pay | Admitting: Family Medicine

## 2013-05-12 ENCOUNTER — Ambulatory Visit (INDEPENDENT_AMBULATORY_CARE_PROVIDER_SITE_OTHER): Payer: Medicare Other | Admitting: Gastroenterology

## 2013-05-12 ENCOUNTER — Encounter: Payer: Self-pay | Admitting: Gastroenterology

## 2013-05-12 VITALS — BP 150/78 | HR 68 | Ht 74.0 in | Wt 186.0 lb

## 2013-05-12 DIAGNOSIS — R198 Other specified symptoms and signs involving the digestive system and abdomen: Secondary | ICD-10-CM

## 2013-05-12 DIAGNOSIS — R634 Abnormal weight loss: Secondary | ICD-10-CM

## 2013-05-12 DIAGNOSIS — R194 Change in bowel habit: Secondary | ICD-10-CM

## 2013-05-12 NOTE — Patient Instructions (Addendum)
We are giving you samples of Dexilant to take once daily for 5-7 days If no improvement take Gas X or Simethicone Follow up in 2-3 weeks

## 2013-05-12 NOTE — Assessment & Plan Note (Addendum)
Patient's main problem is  excess gas.  He may pass small amounts of stool during his trips to the bathroom but stools are solid.  Etiology is not clear however symptoms are temporally related to ERCP with stone extraction and sphincterotomy.  Symptoms did not improve with the addition of align and cholestyramine.  Recommendations #1 trial of dexilant 60 mg daily; if not improved then he will try simethicone 4 times a day.  Finally, I would consider a trial of pancreatic enzyme replacement

## 2013-05-12 NOTE — Progress Notes (Signed)
History of Present Illness:  [name] [name] continues to complain of excess gas.  Because he is unable to distinguish gas from a bowel movement he is having several trips to the toilet daily.  He may pass small amounts of solid scatabulous stools.  He is without abdominal pain, per se.  Appetite is good.  Symptoms have not improved with the addition of  align or cholestyramine    Review of Systems: Pertinent positive and negative review of systems were noted in the above HPI section. All other review of systems were otherwise negative.    Current Medications, Allergies, Past Medical History, Past Surgical History, Family History and Social History were reviewed in [facility] record  Vital signs were reviewed in today's medical record. Physical Exam: General: Well developed , well nourished, no acute distress On abdominal exam abdomen is without masses, tenderness  Or organomegaly

## 2013-05-12 NOTE — Assessment & Plan Note (Signed)
Weight loss has stabilized and he said she gained 3 pounds since his last visit.  We'll continue to follow

## 2013-05-18 ENCOUNTER — Encounter: Payer: Self-pay | Admitting: Cardiology

## 2013-05-18 ENCOUNTER — Ambulatory Visit (INDEPENDENT_AMBULATORY_CARE_PROVIDER_SITE_OTHER): Payer: Medicare Other | Admitting: Cardiology

## 2013-05-18 VITALS — BP 164/84 | HR 67 | Ht 74.0 in | Wt 183.0 lb

## 2013-05-18 DIAGNOSIS — I4949 Other premature depolarization: Secondary | ICD-10-CM

## 2013-05-18 DIAGNOSIS — Z9889 Other specified postprocedural states: Secondary | ICD-10-CM

## 2013-05-18 DIAGNOSIS — I10 Essential (primary) hypertension: Secondary | ICD-10-CM

## 2013-05-18 DIAGNOSIS — I493 Ventricular premature depolarization: Secondary | ICD-10-CM

## 2013-05-18 DIAGNOSIS — I2510 Atherosclerotic heart disease of native coronary artery without angina pectoris: Secondary | ICD-10-CM

## 2013-05-18 NOTE — Patient Instructions (Signed)
Keep an ey on your blood pressure  Continue your current therapy  I will see you in 6 months.

## 2013-05-18 NOTE — Progress Notes (Signed)
[name] [name] Date of Birth: [birth year]   History of Present Illness: [name] [name] is seen today for followup. He states he is doing fairly well. He has been experiencing a lot of back and hip pain. He had an injection of his right SI joint yesterday. He denies any palpitations, chest pain, or shortness of breath. He is status post mitral valve repair with single vessel coronary bypass surgery in 2002. In May of this year he was admitted with common bile duct stones with resultant pancreatitis. He underwent ERCP. He has had persistent bowel issues since then with gas. He has lost 12 pounds. He reports his [name] pressure at home has been normal.  Current Outpatient Prescriptions on File Prior to Visit  Medication Sig Dispense Refill  . amLODipine (NORVASC) 5 MG tablet Take 5 mg by mouth every evening.      Marland Kitchen aspirin 325 MG EC tablet Take 325 mg by mouth daily.        . Cholecalciferol (VITAMIN D) 1000 UNITS capsule Take 1,000 Units by mouth daily.        . CRESTOR 20 MG tablet take 1 tablet by mouth once daily  30 tablet  5  . dutasteride (AVODART) 0.5 MG capsule Take 0.5 mg by mouth daily.        Marland Kitchen gabapentin (NEURONTIN) 300 MG capsule Take 300 mg by mouth 3 (three) times daily.      . Tamsulosin HCl (FLOMAX) 0.4 MG CAPS Take 0.4 mg by mouth daily.        . valsartan-hydrochlorothiazide (DIOVAN-HCT) 320-12.5 MG per tablet Take 1 tablet by mouth every morning.       No current facility-administered medications on file prior to visit.    No Known Allergies  Past Medical History  Diagnosis Date  . MVP (mitral valve prolapse)     s/p Mitral Valve Repair  . CAD (coronary artery disease)     s/p CABG x 1  . HTN (hypertension)   . Hyperlipidemia   . DJD (degenerative joint disease)   . BPH (benign prostatic hyperplasia)   . Arthritis   . Lumbar disc disease   . Pancreatitis   . Gallstones     Past Surgical History  Procedure Laterality Date  . Mitral valve repair  January 2002  .  Coronary artery bypass graft  January 2002    Single bypass to the distal RCA  . Colonoscopy  ~2008    no polyps per pt.  Dr. [name]  . Cholecystectomy      Dr. Lennie Hummer  . Hernia repair  05/05/11    Lap L inguinal & obturator herniae, R Femoral Hernia  . Upper gastrointestinal endoscopy  02/22/13  . Ercp N/A 02/22/2013    Procedure: ENDOSCOPIC RETROGRADE CHOLANGIOPANCREATOGRAPHY (ERCP);  Surgeon: [name], MD;  Location: [facility];  Service: Endoscopy;  Laterality: N/A;  . Spyglass lithotripsy N/A 02/22/2013    Procedure: EVOJJKKX LITHOTRIPSY;  Surgeon: [name], MD;  Location: [facility];  Service: Endoscopy;  Laterality: N/A;  litho ord'd 7/8 by DL/PO [id number] [facility]  . Spyglass cholangioscopy N/A 02/22/2013    Procedure: ZJIRCVEL CHOLANGIOSCOPY;  Surgeon: [name], MD;  Location: [facility];  Service: Endoscopy;  Laterality: N/A;    History  Smoking status  . Never Smoker   Smokeless tobacco  . Never Used    History  Alcohol Use  . 0.0 oz/week  . 1-2 Glasses of wine per week  Comment: rare alcohol use    Family History  Problem Relation Age of Onset  . Stroke Father   . Stroke Mother     TIA    Review of Systems: As noted in history of present illness. All other systems were reviewed and are negative.  Physical Exam: BP 164/84  Pulse 67  Ht 6' 2"  (1.88 m)  Wt 183 lb (83.008 kg)  BMI 23.49 kg/m2  SpO2 96% He is a pleasant white male in no acute distress. His HEENT exam is unremarkable. He has no JVD or bruits. Lungs are clear. Cardiac exam reveals a regular rate and rhythm without gallop or click. There is a soft systolic murmur at the left lower sternal border. Abdomen is soft and nontender without masses or bruits. He has trace ankle edema.  LABORATORY DATA:   Assessment / Plan: 1. Mitral insufficiency status post mitral valve repair. Excellent long-term result.  2. Coronary disease status post single-vessel coronary bypass  surgery with a vein graft to the distal RCA. He is asymptomatic.  3. Hypertension, [name] pressure is elevated today but his readings at home have been normal. He will continue to monitor.  4. Chronic PACs and PVCs. These are asymptomatic now.

## 2013-05-23 ENCOUNTER — Ambulatory Visit: Payer: Medicare Other | Admitting: Gastroenterology

## 2013-05-26 ENCOUNTER — Telehealth: Payer: Self-pay | Admitting: Gastroenterology

## 2013-05-26 MED ORDER — DEXLANSOPRAZOLE 60 MG PO CPDR: 60.0000 mg | DELAYED_RELEASE_CAPSULE | Freq: Every day | ORAL | Status: DC

## 2013-05-26 NOTE — Telephone Encounter (Signed)
Pt states the Dexilant samples have helped him and he would like a script sent in for the [place]. Script sent in and pt aware.

## 2013-06-02 ENCOUNTER — Other Ambulatory Visit: Payer: Self-pay | Admitting: Dermatology

## 2013-06-08 ENCOUNTER — Ambulatory Visit (INDEPENDENT_AMBULATORY_CARE_PROVIDER_SITE_OTHER): Payer: Medicare Other | Admitting: Gastroenterology

## 2013-06-08 ENCOUNTER — Encounter: Payer: Self-pay | Admitting: Gastroenterology

## 2013-06-08 VITALS — BP 130/72 | HR 72 | Ht 74.0 in | Wt 187.8 lb

## 2013-06-08 DIAGNOSIS — R194 Change in bowel habit: Secondary | ICD-10-CM

## 2013-06-08 DIAGNOSIS — R634 Abnormal weight loss: Secondary | ICD-10-CM

## 2013-06-08 DIAGNOSIS — R198 Other specified symptoms and signs involving the digestive system and abdomen: Secondary | ICD-10-CM

## 2013-06-08 NOTE — Patient Instructions (Signed)
Follow up as needed

## 2013-06-08 NOTE — Assessment & Plan Note (Signed)
This appears to stabilize and this improved.  I don't think there is any underlying process per

## 2013-06-08 NOTE — Assessment & Plan Note (Signed)
Symptoms are resolving and likely were a result of extraction of multiple stones and related to bile flow

## 2013-06-08 NOTE — Progress Notes (Signed)
History of Present Illness:  The patient has returned for followup of a change of bowel habits and excess gas.  Symptoms clearly are improved.  Appetite is excellent and weight has increased.  He still has a fair amount of gas but he feels that this is improved.  Bowels are solid.    Review of Systems: Pertinent positive and negative review of systems were noted in the above HPI section. All other review of systems were otherwise negative.    Current Medications, Allergies, Past Medical History, Past Surgical History, Family History and Social History were reviewed in [facility] record  Vital signs were reviewed in today's medical record. Physical Exam: General: Well developed , well nourished, no acute distress

## 2013-06-16 ENCOUNTER — Other Ambulatory Visit: Payer: Self-pay

## 2013-07-05 ENCOUNTER — Other Ambulatory Visit: Payer: Self-pay

## 2013-07-05 MED ORDER — AMLODIPINE BESYLATE 5 MG PO TABS: 5.0000 mg | ORAL_TABLET | Freq: Every evening | ORAL | Status: DC

## 2013-07-14 ENCOUNTER — Encounter: Payer: Self-pay | Admitting: Physician Assistant

## 2013-07-14 ENCOUNTER — Ambulatory Visit (INDEPENDENT_AMBULATORY_CARE_PROVIDER_SITE_OTHER): Payer: Medicare Other | Admitting: Physician Assistant

## 2013-07-14 ENCOUNTER — Telehealth: Payer: Self-pay | Admitting: Gastroenterology

## 2013-07-14 ENCOUNTER — Other Ambulatory Visit (INDEPENDENT_AMBULATORY_CARE_PROVIDER_SITE_OTHER): Payer: Medicare Other

## 2013-07-14 VITALS — BP 112/54 | HR 68 | Ht 73.0 in | Wt 181.0 lb

## 2013-07-14 DIAGNOSIS — R634 Abnormal weight loss: Secondary | ICD-10-CM

## 2013-07-14 DIAGNOSIS — R198 Other specified symptoms and signs involving the digestive system and abdomen: Secondary | ICD-10-CM

## 2013-07-14 DIAGNOSIS — R194 Change in bowel habit: Secondary | ICD-10-CM

## 2013-07-14 LAB — TSH: TSH: 2.27 u[IU]/mL (ref 0.35–5.50)

## 2013-07-14 MED ORDER — NA SULFATE-K SULFATE-MG SULF 17.5-3.13-1.6 GM/177ML PO SOLN: ORAL | Status: DC

## 2013-07-14 NOTE — Telephone Encounter (Signed)
Pt states that he has been having some abdominal pain and cramping and has continued to have wt loss. Pt states he has been having constipation but last night he had some diarrhea. Pt reports that he just has not felt quite right since his ERCP.  Pt scheduled to see Nicoletta Ba PA today at 2pm. Pt aware of appt.

## 2013-07-14 NOTE — Patient Instructions (Signed)
You have been scheduled for a colonoscopy with propofol. Please follow written instructions given to you at your visit today.  Please pick up your prep kit at the pharmacy within the next 1-3 days. If you use inhalers (even only as needed), please bring them with you on the day of your procedure. Your physician has requested that you go to www.startemmi.com and enter the access code given to you at your visit today. This web site gives a general overview about your procedure. However, you should still follow specific instructions given to you by our office regarding your preparation for the procedure.  Your physician has requested that you go to the basement for the following lab work before leaving today: TSH  Please purchase Miralax over the counter. Take 1 capful (17 grams) dissolved in water/juice daily over the next several days to purge your bowels.  Continue Dexilant 1 tablet daily.

## 2013-07-15 ENCOUNTER — Encounter: Payer: Self-pay | Admitting: Physician Assistant

## 2013-07-15 ENCOUNTER — Encounter: Payer: Self-pay | Admitting: Gastroenterology

## 2013-07-15 NOTE — Progress Notes (Signed)
Subjective:    Patient ID: [name] [name], male    DOB: March 03, 1930, 79 y.o.   MRN: [id number]  HPI  [name] [name] is an 79 year old white male known to Dr. Deatra Ina who has history of coronary artery disease he is status post mitral valve replacement also has hypertension and hyperlipidemia. He is status post cholecystectomy and had undergone ERCP with stone extraction in July of 2014. He did have a mild post-ERCP pancreatitis. Last colonoscopy was done in 1998 showing diffuse diverticulosis. Patient has had a couple of office visit since his ERCP with complaints of abdominal bloating, gas abdominal discomfort and change in bowel habits. He  has also had a weight loss now totaling about 15 pounds since the beginning of this year. It was felt perhaps his and and and and and and and is in a in a and a well-developed [place] will and and and and is and and a and and a true adrenaline and hereGI symptoms with bloating etc. were related to the stone extraction. He was given a short trial of Questran without any benefit. He was then given a trial of Dexilant and simethicone.  Patient comes in today stating that he is still having problems with weight loss and intermittent lower Donald pain which she describes as crampy. He cannot tell me if this is left-sided or right-sided but just in the lower abdomen. He still having increasing abdominal gas and for the most part feels that he's been more constipated than anything. He has been passing small pellet-like stools. Last night he had an episode of diarrhea which was quite unusual for him. He has just started on Dexilant  but says that he is also having reflux symptoms particularly at night. His son had recommended Pepcid at bedtime and he plans to try that. He is concerned about the weight loss. He says his appetite is good and he is been eating a normal amount for him he denies any problems with nausea or vomiting not been on any other new medications or  supplements. He has not noticed any melena or hematochezia.  CT scan of the abdomen and pelvis from June 2014 was reviewed this was pre-ERCP. He was noted to have some small hepatic cysts ,small sliding hiatal hernia colonic diverticuli and also had some circumferential thickening of the terminal ileum was not clear whether this represented underdistention or an inflammatory process  Review of Systems  Constitutional: Positive for unexpected weight change.  HENT: Negative.   Eyes: Negative.   Respiratory: Negative.   Cardiovascular: Negative.   Gastrointestinal: Positive for abdominal pain and constipation.  Endocrine: Negative.   Genitourinary: Negative.   Musculoskeletal: Negative.   Skin: Negative.   Allergic/Immunologic: Negative.   Neurological: Negative.   Hematological: Negative.   Psychiatric/Behavioral: Negative.     Outpatient Prescriptions Prior to Visit  Medication Sig Dispense Refill  . amLODipine (NORVASC) 5 MG tablet Take 1 tablet (5 mg total) by mouth every evening.  30 tablet  6  . aspirin 325 MG EC tablet Take 325 mg by mouth daily.        . Cholecalciferol (VITAMIN D) 1000 UNITS capsule Take 1,000 Units by mouth daily.        . CRESTOR 20 MG tablet take 1 tablet by mouth once daily  30 tablet  5  . dexlansoprazole (DEXILANT) 60 MG capsule Take 1 capsule (60 mg total) by mouth daily.  30 capsule  3  . dutasteride (AVODART) 0.5 MG capsule Take  0.5 mg by mouth daily.        Marland Kitchen gabapentin (NEURONTIN) 300 MG capsule Take 300 mg by mouth 3 (three) times daily.      . Tamsulosin HCl (FLOMAX) 0.4 MG CAPS Take 0.4 mg by mouth daily.        . valsartan-hydrochlorothiazide (DIOVAN-HCT) 320-12.5 MG per tablet Take 1 tablet by mouth every morning.       No facility-administered medications prior to visit.   No Known Allergies Patient Active Problem List   Diagnosis Date Noted  . Change in bowel habits 03/24/2013  . Urinary retention 02/24/2013  . Calculus of bile duct  without mention of cholecystitis or obstruction 02/22/2013  . Inguinal pain, lower right quadrant 01/31/2013  . PAC (premature atrial contraction) 11/17/2012  . Lightheadedness 11/25/2011  . Arthritis 09/30/2011  . Hyperlipidemia LDL goal < 70 09/30/2011  . Left obturator hernia 05/21/2011  . Right femoral hernia 05/21/2011  . Left inguinal hernia 04/10/2011  . DOE (dyspnea on exertion) 11/06/2010  . PVC's (premature ventricular contractions) 11/06/2010  . S/P mitral valve repair 11/06/2010  . Fatigue 11/06/2010  . Weight loss 11/06/2010  . Cough 11/06/2010  . CAD (coronary artery disease)   . HTN (hypertension)   . Hyperlipidemia    History  Substance Use Topics  . Smoking status: Never Smoker   . Smokeless tobacco: Never Used  . Alcohol Use: 0.0 oz/week    1-2 Glasses of wine per week     Comment: rare alcohol use   family history includes Stroke in his father and mother.      Objective:   Physical Exam  well-developed elderly white male in no acute distress blood pressure 112/54 pulse 68 height 6 foot 1 weight 181 down from 195 in February 2014. HEENT; nontraumatic normocephalic EOMI PERRLA sclera anicteric, Supple; no JVD, Cardiovascular ;regular rate and rhythm with S1-S2 no murmur or gallop, Pulmonary ;clear bilaterally, Abdomen; soft nondistended bowel sounds are active , he has some mild bilateral lower quadrant tenderness no guarding or rebound no palpable mass or hepatosplenomegaly, Rectal; exam not done, Extremities; no clubbing cyanosis or edema skin warm and dry, Psych ;mood and affect appropriate       Assessment & Plan:  #18  79 year old male with persistent complaints of lower Donald discomfort change in bowel habits with more constipation and gas and weight loss of 15 pounds total in the past 10 months. Etiology of current symptoms not clear. Will rule out colonic lesion and also may need further evaluation of the terminal ileum. #2 status post ERCP and stone  extraction July 2014 with mild post-ERCP pancreatitis #3 status post remote cholecystectomy #4 coronary artery disease #5 mitral valve repair #6 hypertension #7 inguinal hernias #8 GERD  Plan; continue Dexilant 60 mg by mouth daily he was encouraged to take this before his evening meal since his symptoms are primarily nocturnal MiraLax 1 dose daily until he feels his bowels are purged and then as needed We'll schedule for colonoscopy with Dr. Deatra Ina, procedure discussed in detail with the patient and he is agreeable to proceed If colonoscopy unremarkable will need repeat CT imaging to reevaluate terminal ileum

## 2013-08-02 ENCOUNTER — Other Ambulatory Visit: Payer: Self-pay

## 2013-08-02 MED ORDER — VALSARTAN-HYDROCHLOROTHIAZIDE 320-12.5 MG PO TABS: 1.0000 | ORAL_TABLET | Freq: Every morning | ORAL | Status: DC

## 2013-08-15 ENCOUNTER — Encounter: Payer: Self-pay | Admitting: Family Medicine

## 2013-08-15 ENCOUNTER — Ambulatory Visit (INDEPENDENT_AMBULATORY_CARE_PROVIDER_SITE_OTHER): Payer: Medicare Other | Admitting: Family Medicine

## 2013-08-15 VITALS — BP 132/86 | HR 64 | Wt 184.0 lb

## 2013-08-15 DIAGNOSIS — M25519 Pain in unspecified shoulder: Secondary | ICD-10-CM

## 2013-08-15 DIAGNOSIS — M129 Arthropathy, unspecified: Secondary | ICD-10-CM

## 2013-08-15 DIAGNOSIS — R634 Abnormal weight loss: Secondary | ICD-10-CM

## 2013-08-15 DIAGNOSIS — M25512 Pain in left shoulder: Secondary | ICD-10-CM

## 2013-08-15 DIAGNOSIS — M533 Sacrococcygeal disorders, not elsewhere classified: Secondary | ICD-10-CM

## 2013-08-15 DIAGNOSIS — K219 Gastro-esophageal reflux disease without esophagitis: Secondary | ICD-10-CM

## 2013-08-15 DIAGNOSIS — M1990 Unspecified osteoarthritis, unspecified site: Secondary | ICD-10-CM

## 2013-08-15 MED ORDER — METAXALONE 800 MG PO TABS: 800.0000 mg | ORAL_TABLET | Freq: Two times a day (BID) | ORAL | Status: DC

## 2013-08-15 NOTE — Progress Notes (Signed)
   Subjective:    Patient ID: [name] [name], male    DOB: 1930-07-31, 80 y.o.   MRN: [id number]  HPI He is here for an interval evaluation. He did have SI joint injections and subsequently was referred for radiofrequency ablation. He has had this however full effect of this procedure as yet to be realized. He has had a great deal of difficulty with abdominal pain and weight loss. He did have an ERCP and gallstones were removed. Since then he states that he is having much less difficulty with abdominal distress and he also started taking Pepcid at the recommendation of his son for treatment of gas and reflux disease. He states that he is doing much better and in fact has gained a few pounds. He was apparently scheduled for colonoscopy although he is uncertain as to whether he truly needs this. He discussed this with his son and his son recommended against it. He continues to have difficulty with right hip arthritis. Apparently x-rays did show some arthritic changes. He is able to stay active [facility] and working out fairly regularly. He has had some left shoulder pain and has been using 2 Advil twice per day as well as Skelaxin and states that he is doing much better with the shoulder. Review of Systems       Objective:   Physical Exam  alert and in no distress. Left shoulder exam does show questionable pain on rotation of the shoulder. Drop arm test was negative. No palpable tenderness of the trapezius was noted. No laxity noted.        Assessment & Plan:  Left shoulder pain - Plan: metaxalone (SKELAXIN) 800 MG tablet  Weight loss  Arthritis  GERD (gastroesophageal reflux disease)  SI (sacroiliac) joint dysfunction It  is difficult to say what exactly is causing his shoulder pain. He he is responding to conservative care with Skelaxin. Recommend continuing this and heat. Discussed the hip arthritis. As long as he is doing well with this, no further therapy necessary although  replacement is certainly a possibility. He'll continue on Pepcid for his reflux. He will wait and see how the radioablation does for his SI joint dysfunction. We discussed colonoscopy and since he is doing much better after the gallstone removal, colonoscopy is probably not needed at this point. Discussed possibly doing it later if he continues to have weight loss. He is comfortable with that approach. One half hour spent discussing all these issues with him.

## 2013-08-22 ENCOUNTER — Telehealth: Payer: Self-pay | Admitting: Family Medicine

## 2013-08-22 MED ORDER — CARISOPRODOL 350 MG PO TABS: 350.0000 mg | ORAL_TABLET | Freq: Four times a day (QID) | ORAL | Status: DC | PRN

## 2013-08-22 NOTE — Telephone Encounter (Signed)
Let him know that I called in a different muscle relaxer

## 2013-08-22 NOTE — Telephone Encounter (Signed)
Pt aware.

## 2013-08-22 NOTE — Telephone Encounter (Signed)
Patient will switch to [place] for insurance purposes

## 2013-08-23 MED ORDER — BACLOFEN 10 MG PO TABS: 10.0000 mg | ORAL_TABLET | Freq: Three times a day (TID) | ORAL | Status: DC | PRN

## 2013-08-23 NOTE — Telephone Encounter (Signed)
Apparently his insurance will cover baclofen but not the soma. This will be called in.

## 2013-08-25 ENCOUNTER — Telehealth: Payer: Self-pay | Admitting: Family Medicine

## 2013-08-25 NOTE — Telephone Encounter (Signed)
Carisoprodol required P.A.

## 2013-08-29 ENCOUNTER — Telehealth: Payer: Self-pay | Admitting: Internal Medicine

## 2013-08-29 NOTE — Telephone Encounter (Signed)
error 

## 2013-08-30 ENCOUNTER — Encounter: Payer: Medicare Other | Admitting: Gastroenterology

## 2013-08-30 ENCOUNTER — Ambulatory Visit (INDEPENDENT_AMBULATORY_CARE_PROVIDER_SITE_OTHER): Payer: Medicare Other | Admitting: Family Medicine

## 2013-08-30 VITALS — BP 162/80 | HR 66 | Wt 188.0 lb

## 2013-08-30 DIAGNOSIS — R600 Localized edema: Secondary | ICD-10-CM

## 2013-08-30 DIAGNOSIS — R609 Edema, unspecified: Secondary | ICD-10-CM

## 2013-08-30 NOTE — Progress Notes (Signed)
   Subjective:    Patient ID: [name] [name], male    DOB: August 29, 1929, 80 y.o.   MRN: [id number]  HPI He complains of swelling in his left hand. No history of injury. No other extremities are involved. It had no numbness, tingling or weakness. He also continues to have difficulty with back and hip pain. He does have history of hip arthritis and will eventually have a total hip replacement. He has also been getting shots for treatment of other pain conditions. He is considering switching to a different doctor for this.   Review of Systems     Objective:   Physical Exam Exam of the left hand does show swelling over the dorsum of the hand. Joints are normal. There is no point tenderness. Pulses are normal. Of note is the fact that his wristwatch is on relatively tight.       Assessment & Plan:  Edema of hand  I explained that there was no cause for this but I was not alarmed enough to do further evaluation. Suggested that he could be related to his watchband if the swelling continues and move more proximal, I will do more evaluation.Marland KitchenHe will seek medical care from another physician concerning his aches and pains.

## 2013-10-03 ENCOUNTER — Encounter (HOSPITAL_COMMUNITY): Payer: Self-pay | Admitting: Emergency Medicine

## 2013-10-03 ENCOUNTER — Emergency Department (HOSPITAL_COMMUNITY)
Admission: EM | Admit: 2013-10-03 | Discharge: 2013-10-04 | Disposition: A | Discharge status: Home / Self Care | Payer: Medicare Other | Attending: Emergency Medicine | Admitting: Emergency Medicine

## 2013-10-03 ENCOUNTER — Telehealth: Payer: Self-pay | Admitting: Gastroenterology

## 2013-10-03 DIAGNOSIS — R509 Fever, unspecified: Secondary | ICD-10-CM | POA: Insufficient documentation

## 2013-10-03 DIAGNOSIS — R5381 Other malaise: Secondary | ICD-10-CM | POA: Insufficient documentation

## 2013-10-03 DIAGNOSIS — I2510 Atherosclerotic heart disease of native coronary artery without angina pectoris: Secondary | ICD-10-CM | POA: Insufficient documentation

## 2013-10-03 DIAGNOSIS — N400 Enlarged prostate without lower urinary tract symptoms: Secondary | ICD-10-CM | POA: Insufficient documentation

## 2013-10-03 DIAGNOSIS — R112 Nausea with vomiting, unspecified: Secondary | ICD-10-CM | POA: Insufficient documentation

## 2013-10-03 DIAGNOSIS — R5383 Other fatigue: Secondary | ICD-10-CM

## 2013-10-03 DIAGNOSIS — M129 Arthropathy, unspecified: Secondary | ICD-10-CM | POA: Insufficient documentation

## 2013-10-03 DIAGNOSIS — Z862 Personal history of diseases of the blood and blood-forming organs and certain disorders involving the immune mechanism: Secondary | ICD-10-CM | POA: Insufficient documentation

## 2013-10-03 DIAGNOSIS — Z8719 Personal history of other diseases of the digestive system: Secondary | ICD-10-CM | POA: Insufficient documentation

## 2013-10-03 DIAGNOSIS — M1990 Unspecified osteoarthritis, unspecified site: Secondary | ICD-10-CM | POA: Insufficient documentation

## 2013-10-03 DIAGNOSIS — R1013 Epigastric pain: Secondary | ICD-10-CM | POA: Insufficient documentation

## 2013-10-03 DIAGNOSIS — Z7982 Long term (current) use of aspirin: Secondary | ICD-10-CM | POA: Insufficient documentation

## 2013-10-03 DIAGNOSIS — I10 Essential (primary) hypertension: Secondary | ICD-10-CM | POA: Insufficient documentation

## 2013-10-03 DIAGNOSIS — R197 Diarrhea, unspecified: Secondary | ICD-10-CM | POA: Insufficient documentation

## 2013-10-03 DIAGNOSIS — Z79899 Other long term (current) drug therapy: Secondary | ICD-10-CM | POA: Insufficient documentation

## 2013-10-03 DIAGNOSIS — Z8639 Personal history of other endocrine, nutritional and metabolic disease: Secondary | ICD-10-CM | POA: Insufficient documentation

## 2013-10-03 DIAGNOSIS — Z951 Presence of aortocoronary bypass graft: Secondary | ICD-10-CM | POA: Insufficient documentation

## 2013-10-03 LAB — URINALYSIS, ROUTINE W REFLEX MICROSCOPIC
Glucose, UA: NEGATIVE mg/dL
Ketones, ur: NEGATIVE mg/dL
Leukocytes, UA: NEGATIVE
NITRITE: NEGATIVE
Protein, ur: 30 mg/dL — AB
Specific Gravity, Urine: 1.024 (ref 1.005–1.030)
UROBILINOGEN UA: 1 mg/dL (ref 0.0–1.0)
pH: 5.5 (ref 5.0–8.0)

## 2013-10-03 LAB — COMPREHENSIVE METABOLIC PANEL
ALT: 21 U/L (ref 0–53)
AST: 35 U/L (ref 0–37)
Albumin: 3.4 g/dL — ABNORMAL LOW (ref 3.5–5.2)
Alkaline Phosphatase: 188 U/L — ABNORMAL HIGH (ref 39–117)
BILIRUBIN TOTAL: 0.8 mg/dL (ref 0.3–1.2)
BUN: 18 mg/dL (ref 6–23)
CO2: 27 mEq/L (ref 19–32)
CREATININE: 1.02 mg/dL (ref 0.50–1.35)
Calcium: 8.6 mg/dL (ref 8.4–10.5)
Chloride: 97 mEq/L (ref 96–112)
GFR calc Af Amer: 77 mL/min — ABNORMAL LOW (ref >90)
GFR calc non Af Amer: 67 mL/min — ABNORMAL LOW (ref >90)
Glucose, Bld: 123 mg/dL — ABNORMAL HIGH (ref 70–99)
POTASSIUM: 2.8 meq/L — AB (ref 3.7–5.3)
Sodium: 138 mEq/L (ref 137–147)
Total Protein: 6.8 g/dL (ref 6.0–8.3)

## 2013-10-03 LAB — I-STAT TROPONIN, ED: TROPONIN I, POC: 0.06 ng/mL (ref 0.00–0.08)

## 2013-10-03 LAB — CBC WITH DIFFERENTIAL/PLATELET
Basophils Absolute: 0 10*3/uL (ref 0.0–0.1)
Basophils Relative: 0 % (ref 0–1)
EOS PCT: 0 % (ref 0–5)
Eosinophils Absolute: 0 10*3/uL (ref 0.0–0.7)
HCT: 34.7 % — ABNORMAL LOW (ref 39.0–52.0)
Hemoglobin: 11.1 g/dL — ABNORMAL LOW (ref 13.0–17.0)
LYMPHS PCT: 4 % — AB (ref 12–46)
Lymphs Abs: 0.5 10*3/uL — ABNORMAL LOW (ref 0.7–4.0)
MCH: 28.2 pg (ref 26.0–34.0)
MCHC: 32 g/dL (ref 30.0–36.0)
MCV: 88.3 fL (ref 78.0–100.0)
MONO ABS: 0.7 10*3/uL (ref 0.1–1.0)
MONOS PCT: 6 % (ref 3–12)
Neutro Abs: 10.8 10*3/uL — ABNORMAL HIGH (ref 1.7–7.7)
Neutrophils Relative %: 90 % — ABNORMAL HIGH (ref 43–77)
PLATELETS: 189 10*3/uL (ref 150–400)
RBC: 3.93 MIL/uL — AB (ref 4.22–5.81)
RDW: 13.6 % (ref 11.5–15.5)
WBC: 12 10*3/uL — AB (ref 4.0–10.5)

## 2013-10-03 LAB — LIPASE, BLOOD: Lipase: 23 U/L (ref 11–59)

## 2013-10-03 LAB — URINE MICROSCOPIC-ADD ON

## 2013-10-03 MED ORDER — POTASSIUM CHLORIDE 10 MEQ/100ML IV SOLN
10.0000 meq | Freq: Once | INTRAVENOUS | Status: AC
  Administered 2013-10-04: 10 meq via INTRAVENOUS
  Filled 2013-10-03: qty 100

## 2013-10-03 MED ORDER — IOHEXOL 300 MG/ML  SOLN
50.0000 mL | Freq: Once | INTRAMUSCULAR | Status: AC | PRN
  Administered 2013-10-03: 50 mL via ORAL

## 2013-10-03 MED ORDER — POTASSIUM CHLORIDE CRYS ER 20 MEQ PO TBCR
40.0000 meq | EXTENDED_RELEASE_TABLET | Freq: Once | ORAL | Status: AC
  Administered 2013-10-03: 40 meq via ORAL
  Filled 2013-10-03: qty 2

## 2013-10-03 MED ORDER — SODIUM CHLORIDE 0.9 % IV BOLUS (SEPSIS)
1000.0000 mL | Freq: Once | INTRAVENOUS | Status: AC
  Administered 2013-10-03: 1000 mL via INTRAVENOUS

## 2013-10-03 MED ORDER — ONDANSETRON HCL 4 MG/2ML IJ SOLN
4.0000 mg | Freq: Once | INTRAMUSCULAR | Status: AC
  Administered 2013-10-03: 4 mg via INTRAVENOUS
  Filled 2013-10-03: qty 2

## 2013-10-03 NOTE — ED Notes (Signed)
Per EMS: Pt from home. Pt c/o emesis and diarrhea times one. Pt reports abdominal pain, which improved after vomiting.  Pt reports that he feels better at present. Pt denies pain. Pt is A/O x4.

## 2013-10-03 NOTE — ED Notes (Signed)
Bed: KP22 Expected date:  Expected time:  Means of arrival:  Comments: EMS 61 M N/V VSS

## 2013-10-03 NOTE — Telephone Encounter (Signed)
Pt had ERCP done in July. States he is having pain just like he did in July. States the pain was very intense and he was nauseated, lasted about 20 min. Now the pain is gone. Pt wants to know what he should do if this happens again? Please advise.

## 2013-10-03 NOTE — Telephone Encounter (Signed)
He needs LFTs, CBC, amylase and lipase.  He should be seen ideally on Wednesday morning

## 2013-10-03 NOTE — ED Notes (Signed)
Ysidro Evert RN made aware of critical lab value.

## 2013-10-03 NOTE — ED Provider Notes (Signed)
CSN: [id number]     Arrival date & time 10/03/13  2055 History   First MD Initiated Contact with Patient 10/03/13 2306     Chief Complaint  Patient presents with  . Emesis     (Consider location/radiation/quality/duration/timing/severity/associated sxs/prior Treatment) HPI Hx per PT - Fever, epigastric pain x 20 min tonight with one episode of emesis and one episode of diarrhea. No blood in stool or vomit. No return of pain. PT states it felt like a prior episode of pancreatitis with stone in his CBD. He denies any CP or SOB, no cough and no diff urinating. Symptoms MOD in severity. Since this time has had generalized weakness. He called his GI physician DR Deatra Ina and also his PCP and was referred here.     Past Medical History  Diagnosis Date  . MVP (mitral valve prolapse)     s/p Mitral Valve Repair  . CAD (coronary artery disease)     s/p CABG x 1  . HTN (hypertension)   . Hyperlipidemia   . DJD (degenerative joint disease)   . BPH (benign prostatic hyperplasia)   . Arthritis   . Lumbar disc disease   . Pancreatitis   . Gallstones    Past Surgical History  Procedure Laterality Date  . Mitral valve repair  January 2002  . Coronary artery bypass graft  January 2002    Single bypass to the distal RCA  . Colonoscopy  ~2008    no polyps per pt.  Dr. [name]  . Cholecystectomy      Dr. Lennie Hummer  . Hernia repair  05/05/11    Lap L inguinal & obturator herniae, R Femoral Hernia  . Upper gastrointestinal endoscopy  02/22/13  . Ercp N/A 02/22/2013    Procedure: ENDOSCOPIC RETROGRADE CHOLANGIOPANCREATOGRAPHY (ERCP);  Surgeon: [name], MD;  Location: [facility];  Service: Endoscopy;  Laterality: N/A;  . Spyglass lithotripsy N/A 02/22/2013    Procedure: JTTSVXBL LITHOTRIPSY;  Surgeon: [name], MD;  Location: [facility];  Service: Endoscopy;  Laterality: N/A;  litho ord'd 7/8 by DL/PO [id number] [facility]  . Spyglass cholangioscopy N/A 02/22/2013    Procedure:  ZRAQTMAU CHOLANGIOSCOPY;  Surgeon: [name], MD;  Location: [facility];  Service: Endoscopy;  Laterality: N/A;   Family History  Problem Relation Age of Onset  . Stroke Father   . Stroke Mother     TIA   History  Substance Use Topics  . Smoking status: Never Smoker   . Smokeless tobacco: Never Used  . Alcohol Use: 0.0 oz/week    1-2 Glasses of wine per week     Comment: rare alcohol use    Review of Systems  Constitutional: Positive for fever and chills.  Eyes: Negative for visual disturbance.  Respiratory: Negative for shortness of breath.   Cardiovascular: Negative for chest pain.  Gastrointestinal: Positive for vomiting, abdominal pain and diarrhea.  Genitourinary: Negative for dysuria.  Musculoskeletal: Negative for back pain.  Skin: Negative for rash.  Neurological: Positive for weakness. Negative for syncope.  All other systems reviewed and are negative.      Allergies  Review of patient's allergies indicates no known allergies.  Home Medications   Current Outpatient Rx  Name  Route  Sig  Dispense  Refill  . acetaminophen (TYLENOL) 500 MG tablet   Oral   Take 1,000 mg by mouth every 6 (six) hours as needed for mild pain.         Marland Kitchen  amLODipine (NORVASC) 5 MG tablet   Oral   Take 1 tablet (5 mg total) by mouth every evening.   30 tablet   6   . aspirin 325 MG EC tablet   Oral   Take 325 mg by mouth every evening.          . baclofen (LIORESAL) 10 MG tablet   Oral   Take 1 tablet (10 mg total) by mouth 3 (three) times daily as needed for muscle spasms.   30 each   0   . Cholecalciferol (VITAMIN D) 1000 UNITS capsule   Oral   Take 1,000 Units by mouth daily.           . CRESTOR 20 MG tablet      take 1 tablet by mouth once daily   30 tablet   5   . dutasteride (AVODART) 0.5 MG capsule   Oral   Take 0.5 mg by mouth every evening.          . famotidine (PEPCID) 20 MG tablet   Oral   Take 20 mg by mouth at bedtime.          . gabapentin (NEURONTIN) 300 MG capsule   Oral   Take 300 mg by mouth 3 (three) times daily.         . Melatonin 5 MG TABS   Oral   Take 5 mg by mouth at bedtime as needed (sleep).          . Menthol-Methyl Salicylate (BENGAY GREASELESS EX)   Apply externally   Apply 1 application topically as needed (pain).         . Menthol-Methyl Salicylate (ICY HOT) 74-94 % STCK   Apply externally   Apply 1 application topically as needed (pain).         . naproxen sodium (ANAPROX) 220 MG tablet   Oral   Take 440 mg by mouth 2 (two) times daily as needed (pain).         [name] Glycol-Propyl Glycol (SYSTANE) 0.4-0.3 % SOLN   Both Eyes   Place 1 drop into both eyes as needed (dry eye).         . Tamsulosin HCl (FLOMAX) 0.4 MG CAPS   Oral   Take 0.4 mg by mouth every evening.          . traMADol (ULTRAM) 50 MG tablet   Oral   Take 1 tablet by mouth 3 (three) times daily as needed for moderate pain.          Marland Kitchen trolamine salicylate (ASPERCREME) 10 % cream   Topical   Apply 1 application topically as needed for muscle pain.         . valsartan-hydrochlorothiazide (DIOVAN-HCT) 320-12.5 MG per tablet   Oral   Take 1 tablet by mouth every morning.   30 tablet   6    BP 143/66  Pulse 84  Temp(Src) 99.1 F (37.3 C) (Oral)  Resp 15  SpO2 93% Physical Exam  Constitutional: He is oriented to person, place, and time. He appears well-developed and well-nourished.  HENT:  Head: Normocephalic and atraumatic.  Dry mm  Eyes: EOM are normal. Pupils are equal, round, and reactive to light. No scleral icterus.  Neck: Neck supple.  Cardiovascular: Intact distal pulses.   irregular  Pulmonary/Chest: Effort normal and breath sounds normal. No respiratory distress. He exhibits no tenderness.  Abdominal: Soft. Bowel sounds are normal. He exhibits no distension and no mass.  There is no rebound and no guarding.  TTP epigastric  Musculoskeletal: Normal range of motion. He  exhibits no edema.  Neurological: He is alert and oriented to person, place, and time.  Skin: Skin is warm and dry.    ED Course  Procedures (including critical care time) Labs Review Labs Reviewed  CBC WITH DIFFERENTIAL - Abnormal; Notable for the following:    WBC 12.0 (*)    RBC 3.93 (*)    Hemoglobin 11.1 (*)    HCT 34.7 (*)    Neutrophils Relative % 90 (*)    Lymphocytes Relative 4 (*)    Neutro Abs 10.8 (*)    Lymphs Abs 0.5 (*)    All other components within normal limits  COMPREHENSIVE METABOLIC PANEL - Abnormal; Notable for the following:    Potassium 2.8 (*)    Glucose, Bld 123 (*)    Albumin 3.4 (*)    Alkaline Phosphatase 188 (*)    GFR calc non Af Amer 67 (*)    GFR calc Af Amer 77 (*)    All other components within normal limits  URINALYSIS, ROUTINE W REFLEX MICROSCOPIC - Abnormal; Notable for the following:    Hgb urine dipstick SMALL (*)    Bilirubin Urine SMALL (*)    Protein, ur 30 (*)    All other components within normal limits  LIPASE, BLOOD  URINE MICROSCOPIC-ADD ON  I-STAT TROPOININ, ED   Imaging Review Ct Abdomen Pelvis W Contrast  10/04/2013   CLINICAL DATA:  80 year old male with abdominal and pelvic pain, nausea, vomiting and elevated white count.  EXAM: CT ABDOMEN AND PELVIS WITH CONTRAST  TECHNIQUE: Multidetector CT imaging of the abdomen and pelvis was performed using the standard protocol following bolus administration of intravenous contrast.  CONTRAST:  151m OMNIPAQUE IOHEXOL 300 MG/ML  SOLN  COMPARISON:  01/17/2013 CT  FINDINGS: Mild basilar scarring noted.  A small hiatal hernia is present.  Hepatic cysts are again identified. A small amount of pneumobilia is present.  The spleen, adrenal glands and pancreas are unremarkable.  Bilateral renal cortical thinning, punctate nonobstructing bilateral renal calculi and multiple bilateral renal cysts again noted.  The patient is status post cholecystectomy.  There is no evidence of free fluid,  enlarged lymph nodes, biliary dilation or abdominal aortic aneurysm.  Descending and sigmoid colonic diverticulosis noted without evidence of diverticulitis.  There is no evidence of bowel obstruction, pneumoperitoneum or abscess.  Prostate enlargement is noted.  The bladder is unremarkable.  No acute or suspicious bony abnormalities are identified. Degenerative changes within the lumbar spine and hips again noted.  IMPRESSION: No evidence of acute abnormality.  Small hiatal hernia and colonic diverticulosis.  Punctate nonobstructing bilateral renal calculi.  Prostate enlargement.   Electronically Signed   By: JHassan RowanM.D.   On: 10/04/2013 01:04    EKG Interpretation    Date/Time:  Monday October 03 2013 20:57:31 EST Ventricular Rate:  67 PR Interval:  197 QRS Duration: 106 QT Interval:  387 QTC Calculation: 408 R Axis:   8 Text Interpretation:  Atrial fibrillation Premature ventricular complexes Nonspecific ST and T wave abnormality Abnormal ECG Confirmed by Kilani Joffe  MD, Carols Clemence ([phone] on 10/03/2013 11:46:28 PM           Filed Vitals:   10/04/13 0139  BP: 133/66  Pulse: 45  Temp:   Resp: 12   IV fluids. IV Zofran. IV potassium. PO potassium provided  3:39 AM patient has remained asymptomatic in the  emergency department since medications. He is tolerating by mouth fluids without nausea.  He is adamant about being discharged home due to feeling better. Repeat abdominal exam soft, nontender, nondistended. Patient agrees to close primary care followup for recheck and have his potassium rechecked. Prescription for Zofran provided. Strict return precautions verbalized is understood.  MDM   Dx: Fever, N/V/D   Epigastric pain likely related to viral process  ECG reviewed - ? afib - PT states he has irregular HR that is not Afib - is followed by cardiology Labs and imaging obtained and reviewed as above. Potassium provided. IV fluids and Zofran - no further abdominal pain and no further  nausea     Teressa Lower, MD 10/04/13 [phone]

## 2013-10-04 ENCOUNTER — Emergency Department (HOSPITAL_COMMUNITY): Payer: Medicare Other

## 2013-10-04 ENCOUNTER — Other Ambulatory Visit: Payer: Self-pay

## 2013-10-04 DIAGNOSIS — R109 Unspecified abdominal pain: Secondary | ICD-10-CM

## 2013-10-04 MED ORDER — ONDANSETRON HCL 4 MG PO TABS: 4.0000 mg | ORAL_TABLET | Freq: Four times a day (QID) | ORAL | Status: DC

## 2013-10-04 MED ORDER — IOHEXOL 300 MG/ML  SOLN
100.0000 mL | Freq: Once | INTRAMUSCULAR | Status: AC | PRN
  Administered 2013-10-04: 100 mL via INTRAVENOUS

## 2013-10-04 NOTE — Discharge Instructions (Signed)
Abdominal Pain, Adult °Many things can cause abdominal pain. Usually, abdominal pain is not caused by a disease and will improve without treatment. It can often be observed and treated at home. Your health care provider will do a physical exam and possibly order blood tests and X-rays to help determine the seriousness of your pain. However, in many cases, more time must pass before a clear cause of the pain can be found. Before that point, your health care provider may not know if you need more testing or further treatment. °HOME CARE INSTRUCTIONS  °Monitor your abdominal pain for any changes. The following actions may help to alleviate any discomfort you are experiencing: °· Only take over-the-counter or prescription medicines as directed by your health care provider. °· Do not take laxatives unless directed to do so by your health care provider. °· Try a clear liquid diet (broth, tea, or water) as directed by your health care provider. Slowly move to a bland diet as tolerated. °SEEK MEDICAL CARE IF: °· You have unexplained abdominal pain. °· You have abdominal pain associated with nausea or diarrhea. °· You have pain when you urinate or have a bowel movement. °· You experience abdominal pain that wakes you in the night. °· You have abdominal pain that is worsened or improved by eating food. °· You have abdominal pain that is worsened with eating fatty foods. °SEEK IMMEDIATE MEDICAL CARE IF:  °· Your pain does not go away within 2 hours. °· You have a fever. °· You keep throwing up (vomiting). °· Your pain is felt only in portions of the abdomen, such as the right side or the left lower portion of the abdomen. °· You pass bloody or black tarry stools. °MAKE SURE YOU: °· Understand these instructions.   °· Will watch your condition.   °· Will get help right away if you are not doing well or get worse.   °Document Released: 05/07/2005 Document Revised: 05/18/2013 Document Reviewed: 04/06/2013 °[employer]® Patient  Information ©2014 [employer]. ° °

## 2013-10-04 NOTE — ED Notes (Signed)
Pt offered water and tolerating w/o difficulty.  Pt denies pain at this time and states he is feeling better.

## 2013-10-04 NOTE — Telephone Encounter (Signed)
I am not convinced.  Alkaline phosphatase is elevated.  Please order an MRCP

## 2013-10-04 NOTE — Telephone Encounter (Signed)
Pt scheduled for MRCP at [facility] 10/18/13@3pm . Pt to arrive there at 2:45pm and be NPO after 11am.

## 2013-10-04 NOTE — Telephone Encounter (Signed)
Called and spoke with pts wife. She states they were in the ER all last night and were told he had a Stomach bug. Pt was running a temperature at the time. Pt was just released from the ER around 5:30am. Dr. Deatra Ina notified.

## 2013-10-05 MED ORDER — DIAZEPAM 10 MG PO TABS: ORAL_TABLET | ORAL | Status: DC

## 2013-10-05 NOTE — Telephone Encounter (Signed)
Pt aware of appt date and time. Pt states he dose better with some valium prior to an MRI to help him relax. Would like something sent to the pharmacy to help him get through the MRCP. Please advise.

## 2013-10-05 NOTE — Telephone Encounter (Signed)
Valium 34m 1/2 hour prior to exam

## 2013-10-05 NOTE — Telephone Encounter (Signed)
Pt aware and script sent to the pharmacy.

## 2013-10-07 ENCOUNTER — Ambulatory Visit (INDEPENDENT_AMBULATORY_CARE_PROVIDER_SITE_OTHER): Payer: Medicare Other | Admitting: Family Medicine

## 2013-10-07 ENCOUNTER — Encounter: Payer: Self-pay | Admitting: Family Medicine

## 2013-10-07 VITALS — BP 136/70 | HR 60 | Wt 187.0 lb

## 2013-10-07 DIAGNOSIS — E876 Hypokalemia: Secondary | ICD-10-CM

## 2013-10-07 DIAGNOSIS — D72829 Elevated white blood cell count, unspecified: Secondary | ICD-10-CM

## 2013-10-07 DIAGNOSIS — M461 Sacroiliitis, not elsewhere classified: Secondary | ICD-10-CM

## 2013-10-07 LAB — CBC WITH DIFFERENTIAL/PLATELET
HCT: 36.8 % — ABNORMAL LOW (ref 39.0–52.0)
Hemoglobin: 12.1 g/dL — ABNORMAL LOW (ref 13.0–17.0)
Lymphocytes Relative: 11 % — ABNORMAL LOW (ref 12–46)
Lymphs Abs: 0.7 10*3/uL (ref 0.7–4.0)
MCH: 28.3 pg (ref 26.0–34.0)
MCHC: 32.8 g/dL (ref 30.0–36.0)
MCV: 86.3 fL (ref 78.0–100.0)
Monocytes Absolute: 0.4 10*3/uL (ref 0.1–1.0)
Monocytes Relative: 7 % (ref 3–12)
Neutro Abs: 5.4 10*3/uL (ref 1.7–7.7)
Neutrophils Relative %: 82 % — ABNORMAL HIGH (ref 43–77)
PLATELETS: 188 10*3/uL (ref 150–400)
RBC: 4.27 MIL/uL (ref 4.22–5.81)
RDW: 14.3 % (ref 11.5–15.5)
WBC: 6.6 10*3/uL (ref 4.0–10.5)

## 2013-10-07 LAB — BASIC METABOLIC PANEL
BUN: 17 mg/dL (ref 6–23)
CHLORIDE: 97 meq/L (ref 96–112)
CO2: 27 meq/L (ref 19–32)
Calcium: 8.7 mg/dL (ref 8.4–10.5)
Creat: 1.1 mg/dL (ref 0.50–1.35)
Glucose, Bld: 109 mg/dL — ABNORMAL HIGH (ref 70–99)
Potassium: 3.2 mEq/L — ABNORMAL LOW (ref 3.5–5.3)
SODIUM: 138 meq/L (ref 135–145)

## 2013-10-07 NOTE — Patient Instructions (Signed)
Take 2 tramadol 3 times per day as needed

## 2013-10-08 ENCOUNTER — Encounter: Payer: Self-pay | Admitting: Family Medicine

## 2013-10-08 NOTE — Progress Notes (Signed)
   Subjective:    Patient ID: [name] [name], male    DOB: [birth year], 80 y.o.   MRN: [id number]  HPI He is here for recheck after recent hospital visit for evaluation of vomiting abdominal pain. The blood work did show a low potassium and an elevated white blood count. He was given fluids while in the emergency room and recommended followup. He states that he is no longer having any abdominal pain, vomiting or bowel habit changes. He also complains of continued difficulty with back pain. He has been seen in the past by Dr. Nelva Bush and apparently did have epidural injections. He also was seen by an orthopedic surgeon and apparently had a procedure done on his SI joint.  Review of Systems     Objective:   Physical Exam Alert and slightly pale appearing. Abdominal exam shows no masses or tenderness. Exam of his back does show tenderness over the right SI joint. Hip motion is normal. Good lumbar motion. Negative straight leg raising.       Assessment & Plan:  Hypokalemia - Plan: Basic Metabolic Panel  Elevated white blood cell count - Plan: CBC with Differential  Sacroiliitis - Plan: Ambulatory referral to Orthopedic Surgery  blood work was ordered stat and did show an improvement in his white count. His potassium was still slightly low. I recommended teaspoon of Losalt daily to his diet and repeating this in roughly one week. He is also to followup concerning his sacroiliitis. Did recommend possibly having another nerve ablation.

## 2013-10-10 ENCOUNTER — Telehealth: Payer: Self-pay | Admitting: Family Medicine

## 2013-10-10 NOTE — Telephone Encounter (Signed)
Pt has lots of questions and request that you call him [phone]. 1.  He can't find the low salt, he has been to drug stores and health stores and pharacist can't get it.  What else? 2.  He did buy Amino Acid, should he take that? 3.  Back pain.  He took the Tramadol 2 tab three times per day as you told him for back pain and his diarrhea got so bad he had to stop taking it.  So what now for pain? 4.  Referral.  He said you were going to refer him to Dr. Ernestina Patches at [facility] but has not heard back from you.  And should he keep his upcoming appt with Dr. Nelva Bush or cancel it? Please call pt [phone]

## 2013-10-10 NOTE — Telephone Encounter (Signed)
All his questions were answered.

## 2013-10-18 ENCOUNTER — Other Ambulatory Visit: Payer: Self-pay | Admitting: Gastroenterology

## 2013-10-18 ENCOUNTER — Ambulatory Visit (HOSPITAL_COMMUNITY)
Admission: RE | Admit: 2013-10-18 | Discharge: 2013-10-18 | Disposition: A | Discharge status: Home / Self Care | Payer: Medicare Other | Source: Ambulatory Visit | Attending: Gastroenterology | Admitting: Gastroenterology

## 2013-10-18 DIAGNOSIS — R109 Unspecified abdominal pain: Secondary | ICD-10-CM

## 2013-10-18 DIAGNOSIS — K7689 Other specified diseases of liver: Secondary | ICD-10-CM | POA: Insufficient documentation

## 2013-10-18 DIAGNOSIS — N281 Cyst of kidney, acquired: Secondary | ICD-10-CM | POA: Insufficient documentation

## 2013-10-20 ENCOUNTER — Telehealth: Payer: Self-pay | Admitting: Gastroenterology

## 2013-10-20 DIAGNOSIS — R945 Abnormal results of liver function studies: Principal | ICD-10-CM

## 2013-10-20 DIAGNOSIS — R7989 Other specified abnormal findings of blood chemistry: Secondary | ICD-10-CM

## 2013-10-20 NOTE — Telephone Encounter (Signed)
MRI is unremarkable.  No evidence for recurrent stones. Please repeat LFTs and schedule a return office visit.

## 2013-10-20 NOTE — Telephone Encounter (Signed)
Pt calling for MRI results, please advise.

## 2013-10-20 NOTE — Telephone Encounter (Signed)
Left message for pt to call back.  Pt scheduled to see Dr. Deatra Ina tomorrow at 3:45pm and have labs.

## 2013-10-21 ENCOUNTER — Ambulatory Visit (INDEPENDENT_AMBULATORY_CARE_PROVIDER_SITE_OTHER): Payer: Medicare Other | Admitting: Gastroenterology

## 2013-10-21 ENCOUNTER — Other Ambulatory Visit: Payer: Medicare Other

## 2013-10-21 ENCOUNTER — Encounter: Payer: Self-pay | Admitting: Gastroenterology

## 2013-10-21 VITALS — BP 120/60 | HR 59 | Ht 73.0 in | Wt 181.0 lb

## 2013-10-21 DIAGNOSIS — R7989 Other specified abnormal findings of blood chemistry: Secondary | ICD-10-CM

## 2013-10-21 DIAGNOSIS — R945 Abnormal results of liver function studies: Secondary | ICD-10-CM

## 2013-10-21 DIAGNOSIS — R1013 Epigastric pain: Secondary | ICD-10-CM

## 2013-10-21 LAB — HEPATIC FUNCTION PANEL
ALBUMIN: 3.5 g/dL (ref 3.5–5.2)
ALK PHOS: 63 U/L (ref 39–117)
ALT: 15 U/L (ref 0–53)
AST: 19 U/L (ref 0–37)
Bilirubin, Direct: 0.1 mg/dL (ref 0.0–0.3)
TOTAL PROTEIN: 7 g/dL (ref 6.0–8.3)
Total Bilirubin: 0.7 mg/dL (ref 0.3–1.2)

## 2013-10-21 NOTE — Assessment & Plan Note (Signed)
Patient had an episode of severe upper abdominal pain reminiscent of his prior pain associated with bile duct stones, with elevation of alkaline phosphatase.  Scans including CT and MRI were unrevealing.  I remain concerned that he may have passed a bile duct stone or that he has a retained stone.  Very abrupt onset and resolution of symptoms somewhat mitigate against a viral gastroenteritis.  Recommendations #1 repeat LFTs; if normal I will treat expectantly.  Should his alk phosphatase remained elevated I would consider repeat ERCP versus expectant enhancement

## 2013-10-21 NOTE — Progress Notes (Signed)
          History of Present Illness:  The patient has returned for evaluation of abdominal pain.  Approximately 3 weeks ago he developed sudden onset of severe upper abdominal pain reminiscent of the pain he experienced with bile duct stones.  This was followed by protracted nausea and vomiting and diarrhea and temperature of 102.  He was seen in the ER where LFTs were pertinent for an alkaline phosphatase of 188.  Other liver tests were normal.  CT scan was unremarkable.  All symptoms including fever subsided within 9 hours.  Since that time he has felt well except for a very mild postprandial abdominal aching.  MRCP 10 days later showed mild dilatation of the bile ducts but no evidence for stones.    Review of Systems: He complains of severe hip and lower back pain which is chronic.  Pertinent positive and negative review of systems were noted in the above HPI section. All other review of systems were otherwise negative.    Current Medications, Allergies, Past Medical History, Past Surgical History, Family History and Social History were reviewed in [facility] record  Vital signs were reviewed in today's medical record. Physical Exam: General: Well developed , well nourished, no acute distress Skin: anicteric Head: Normocephalic and atraumatic Eyes:  sclerae anicteric, EOMI Ears: Normal auditory acuity Mouth: No deformity or lesions Lungs: Clear throughout to auscultation Heart: Regular rate and rhythm; no murmurs, rubs or bruits Abdomen: Soft, non tender and non distended. No masses, hepatosplenomegaly or hernias noted. Normal Bowel sounds Rectal:deferred Musculoskeletal: Symmetrical with no gross deformities  Pulses:  Normal pulses noted Extremities: No clubbing, cyanosis, edema or deformities noted Neurological: Alert oriented x 4, grossly nonfocal Psychological:  Alert and cooperative. Normal mood and affect  See Assessment and Plan under Problem  List

## 2013-10-21 NOTE — Patient Instructions (Signed)
Go to the basement for labs today 

## 2013-10-24 NOTE — Progress Notes (Signed)
Quick Note:  Please inform the patient that liver tests were normal. He should contact the office if he has recurrent abdominal pain. ______

## 2013-11-14 ENCOUNTER — Telehealth: Payer: Self-pay | Admitting: Family Medicine

## 2013-11-14 MED ORDER — BECLOMETHASONE DIPROP MONOHYD 42 MCG/SPRAY NA SUSP: 2.0000 | NASAL | Status: DC

## 2013-11-14 NOTE — Telephone Encounter (Signed)
Pt called and requested a refill on Beconase sent to [facility] on [address].

## 2013-11-22 ENCOUNTER — Encounter: Payer: Self-pay | Admitting: Cardiology

## 2013-11-22 ENCOUNTER — Ambulatory Visit (INDEPENDENT_AMBULATORY_CARE_PROVIDER_SITE_OTHER): Payer: Medicare Other | Admitting: Cardiology

## 2013-11-22 VITALS — BP 146/80 | HR 54 | Ht 73.0 in | Wt 184.8 lb

## 2013-11-22 DIAGNOSIS — Z9889 Other specified postprocedural states: Secondary | ICD-10-CM

## 2013-11-22 DIAGNOSIS — I2510 Atherosclerotic heart disease of native coronary artery without angina pectoris: Secondary | ICD-10-CM

## 2013-11-22 DIAGNOSIS — I10 Essential (primary) hypertension: Secondary | ICD-10-CM

## 2013-11-22 DIAGNOSIS — I498 Other specified cardiac arrhythmias: Secondary | ICD-10-CM | POA: Insufficient documentation

## 2013-11-22 HISTORY — DX: Other specified cardiac arrhythmias: I49.8

## 2013-11-22 NOTE — Progress Notes (Signed)
[name] [name] Date of Birth: [birth year]   History of Present Illness: [name] [name] is seen today for followup. He states he is doing fairly well. He has been experiencing a lot of back and hip pain. He is having a back injection next week. May need hip surgery. This has limited his activity. He denies any palpitations, chest pain, or shortness of breath. He is status post mitral valve repair with single vessel coronary bypass surgery in 2002. With his GI problems last summer and his orthopedic issues he states he has had a tough year. He does complain of swelling of his left hand particularly at night. He is considering a move to assisted living community.  Current Outpatient Prescriptions on File Prior to Visit  Medication Sig Dispense Refill  . acetaminophen (TYLENOL) 500 MG tablet Take 1,000 mg by mouth every 6 (six) hours as needed for mild pain.      Marland Kitchen amLODipine (NORVASC) 5 MG tablet Take 1 tablet (5 mg total) by mouth every evening.  30 tablet  6  . aspirin 325 MG EC tablet Take 325 mg by mouth every evening.       . beclomethasone (BECONASE AQ) 42 MCG/SPRAY nasal spray Place 2 sprays into both nostrils 1 day or 1 dose. Dose is for each nostril.  25 g  12  . Cholecalciferol (VITAMIN D) 1000 UNITS capsule Take 1,000 Units by mouth daily.        . CRESTOR 20 MG tablet take 1 tablet by mouth once daily  30 tablet  5  . dutasteride (AVODART) 0.5 MG capsule Take 0.5 mg by mouth every evening.       . famotidine (PEPCID) 20 MG tablet Take 20 mg by mouth at bedtime.      . gabapentin (NEURONTIN) 300 MG capsule Take 1,800 mg by mouth 3 (three) times daily.       . Melatonin 5 MG TABS Take 5 mg by mouth at bedtime as needed (sleep).       . Menthol-Methyl Salicylate (BENGAY GREASELESS EX) Apply 1 application topically as needed (pain).      . Menthol-Methyl Salicylate (ICY HOT) 16-10 % STCK Apply 1 application topically as needed (pain).      [name] [name] Glycol-Propyl Glycol (SYSTANE) 0.4-0.3 %  SOLN Place 1 drop into both eyes as needed (dry eye).      . Tamsulosin HCl (FLOMAX) 0.4 MG CAPS Take 0.4 mg by mouth every evening.       . trolamine salicylate (ASPERCREME) 10 % cream Apply 1 application topically as needed for muscle pain.      . valsartan-hydrochlorothiazide (DIOVAN-HCT) 320-12.5 MG per tablet Take 1 tablet by mouth every morning.  30 tablet  6   No current facility-administered medications on file prior to visit.    No Known Allergies  Past Medical History  Diagnosis Date  . MVP (mitral valve prolapse)     s/p Mitral Valve Repair  . CAD (coronary artery disease)     s/p CABG x 1  . HTN (hypertension)   . Hyperlipidemia   . DJD (degenerative joint disease)   . BPH (benign prostatic hyperplasia)   . Arthritis   . Lumbar disc disease   . Pancreatitis   . Gallstones     Past Surgical History  Procedure Laterality Date  . Mitral valve repair  January 2002  . Coronary artery bypass graft  January 2002    Single bypass to the distal RCA  .  Colonoscopy  ~2008    no polyps per pt.  Dr. [name]  . Cholecystectomy      Dr. Lennie Hummer  . Hernia repair  05/05/11    Lap L inguinal & obturator herniae, R Femoral Hernia  . Upper gastrointestinal endoscopy  02/22/13  . Ercp N/A 02/22/2013    Procedure: ENDOSCOPIC RETROGRADE CHOLANGIOPANCREATOGRAPHY (ERCP);  Surgeon: [name], MD;  Location: [facility];  Service: Endoscopy;  Laterality: N/A;  . Spyglass lithotripsy N/A 02/22/2013    Procedure: [id number] LITHOTRIPSY;  Surgeon: [name], MD;  Location: [facility];  Service: Endoscopy;  Laterality: N/A;  litho ord'd 7/8 by DL/PO [id number] [facility]  . Spyglass cholangioscopy N/A 02/22/2013    Procedure: [id number] CHOLANGIOSCOPY;  Surgeon: [name], MD;  Location: [facility];  Service: Endoscopy;  Laterality: N/A;    History  Smoking status  . Never Smoker   Smokeless tobacco  . Never Used    History  Alcohol Use  . 0.0 oz/week  . 1-2  Glasses of wine per week    Comment: rare alcohol use    Family History  Problem Relation Age of Onset  . Stroke Father   . Stroke Mother     TIA    Review of Systems: As noted in history of present illness. All other systems were reviewed and are negative.  Physical Exam: BP 146/80  Pulse 54  Ht 6' 1"  (1.854 m)  Wt 184 lb 12.8 oz (83.825 kg)  BMI 24.39 kg/m2  SpO2 97% He is a pleasant white male in no acute distress. His HEENT exam is unremarkable. He has no JVD or bruits. Lungs are clear. Cardiac exam reveals a regular rate and rhythm without gallop or click. There is a soft systolic murmur at the left lower sternal border. Abdomen is soft and nontender without masses or bruits. He has trace ankle edema. His left hand is mildly swollen. No arm swelling. Pulse is good.  LABORATORY DATA: Lab Results  Component Value Date   WBC 6.6 10/07/2013   HGB 12.1* 10/07/2013   HCT 36.8* 10/07/2013   PLT 188 10/07/2013   GLUCOSE 109* 10/07/2013   CHOL 117 10/07/2012   TRIG 63 10/07/2012   HDL 38* 10/07/2012   LDLCALC 66 10/07/2012   ALT 15 10/21/2013   AST 19 10/21/2013   NA 138 10/07/2013   K 3.2* 10/07/2013   CL 97 10/07/2013   CREATININE 1.10 10/07/2013   BUN 17 10/07/2013   CO2 27 10/07/2013   TSH 2.27 07/14/2013   Ecg reviewed from ED 10/04/13. This was interpreted as AFib but review of tracing is more consistent with wandering atrial pacemaker with frequent PACs,. Nonspecific ST-T changes.  Assessment / Plan: 1. Mitral insufficiency status post mitral valve repair. Excellent long-term result.  2. Coronary disease status post single-vessel coronary bypass surgery with a vein graft to the distal RCA. He is asymptomatic.  3. Hypertension, [name] pressure control improved today. He will continue to monitor.  4. Chronic PACs and PVCs. These are asymptomatic now. Wandering atrial pacemaker by Ec[facility] (not Afib).

## 2013-11-22 NOTE — Patient Instructions (Signed)
Continue your current therapy  I will see you in 6 months.   

## 2013-12-15 ENCOUNTER — Other Ambulatory Visit: Payer: Self-pay | Admitting: Family Medicine

## 2013-12-20 ENCOUNTER — Other Ambulatory Visit (HOSPITAL_COMMUNITY): Payer: Self-pay | Admitting: Physical Medicine and Rehabilitation

## 2013-12-20 DIAGNOSIS — B999 Unspecified infectious disease: Secondary | ICD-10-CM

## 2013-12-21 ENCOUNTER — Ambulatory Visit (HOSPITAL_COMMUNITY)
Admission: RE | Admit: 2013-12-21 | Discharge: 2013-12-21 | Disposition: A | Discharge status: Home / Self Care | Payer: Medicare Other | Source: Ambulatory Visit | Attending: Physical Medicine and Rehabilitation | Admitting: Physical Medicine and Rehabilitation

## 2013-12-21 DIAGNOSIS — Q619 Cystic kidney disease, unspecified: Secondary | ICD-10-CM | POA: Insufficient documentation

## 2013-12-21 DIAGNOSIS — M5137 Other intervertebral disc degeneration, lumbosacral region: Secondary | ICD-10-CM | POA: Insufficient documentation

## 2013-12-21 DIAGNOSIS — B999 Unspecified infectious disease: Secondary | ICD-10-CM

## 2013-12-21 DIAGNOSIS — M47817 Spondylosis without myelopathy or radiculopathy, lumbosacral region: Secondary | ICD-10-CM | POA: Insufficient documentation

## 2013-12-21 DIAGNOSIS — M51379 Other intervertebral disc degeneration, lumbosacral region without mention of lumbar back pain or lower extremity pain: Secondary | ICD-10-CM | POA: Insufficient documentation

## 2013-12-21 DIAGNOSIS — M549 Dorsalgia, unspecified: Secondary | ICD-10-CM | POA: Insufficient documentation

## 2013-12-21 LAB — POCT I-STAT CREATININE: Creatinine, Ser: 1.1 mg/dL (ref 0.50–1.35)

## 2013-12-21 MED ORDER — GADOBENATE DIMEGLUMINE 529 MG/ML IV SOLN
20.0000 mL | Freq: Once | INTRAVENOUS | Status: AC | PRN
  Administered 2013-12-21: 18 mL via INTRAVENOUS

## 2013-12-22 ENCOUNTER — Other Ambulatory Visit: Payer: Self-pay | Admitting: Physician Assistant

## 2013-12-22 DIAGNOSIS — R937 Abnormal findings on diagnostic imaging of other parts of musculoskeletal system: Secondary | ICD-10-CM

## 2013-12-23 ENCOUNTER — Ambulatory Visit
Admission: RE | Admit: 2013-12-23 | Discharge: 2013-12-23 | Disposition: A | Discharge status: Home / Self Care | Payer: Medicare Other | Source: Ambulatory Visit | Attending: Physician Assistant | Admitting: Physician Assistant

## 2013-12-23 ENCOUNTER — Other Ambulatory Visit (HOSPITAL_COMMUNITY): Payer: Self-pay | Admitting: Diagnostic Radiology

## 2013-12-23 VITALS — BP 163/82 | HR 61 | Temp 97.5°F | Resp 14

## 2013-12-23 DIAGNOSIS — R937 Abnormal findings on diagnostic imaging of other parts of musculoskeletal system: Secondary | ICD-10-CM

## 2013-12-23 MED ORDER — FENTANYL CITRATE 0.05 MG/ML IJ SOLN
25.0000 ug | INTRAMUSCULAR | Status: DC | PRN
  Administered 2013-12-23: 25 ug via INTRAVENOUS
  Administered 2013-12-23: 50 ug via INTRAVENOUS
  Administered 2013-12-23: 25 ug via INTRAVENOUS

## 2013-12-23 MED ORDER — SODIUM CHLORIDE 0.9 % IV SOLN
Freq: Once | INTRAVENOUS | Status: AC
  Administered 2013-12-23: 07:00:00 via INTRAVENOUS

## 2013-12-23 MED ORDER — MIDAZOLAM HCL 2 MG/2ML IJ SOLN
1.0000 mg | INTRAMUSCULAR | Status: DC | PRN
  Administered 2013-12-23 (×2): 1 mg via INTRAVENOUS

## 2013-12-23 NOTE — Progress Notes (Signed)
39 minutes sedation time during L2-L3 disc aspiration.  jkl

## 2013-12-23 NOTE — Progress Notes (Signed)
Heart sounds are irregular, pt states he has a benign irregular heart rhythm not a-fib. Lungs are clear bilaterally.

## 2013-12-23 NOTE — Progress Notes (Signed)
Dr. Gerilyn Pilgrim in to talk to pt.DD RN

## 2013-12-23 NOTE — Discharge Instructions (Signed)
Disc Aspiration Post Procedure Discharge Instructions  1. May resume a regular diet and any medications that you routinely take (including pain medications). 2. No driving day of procedure. 3. Upon discharge go home and rest for at least 4 hours.  May use an ice pack as needed to injection sites on back. 4. Remove bandades later, today.    Please contact our office at [phone] for the following symptoms:   Fever greater than 100 degrees  Increased swelling, pain, or redness at injection site.   Thank you for visiting [facility].

## 2013-12-29 ENCOUNTER — Other Ambulatory Visit: Payer: Self-pay | Admitting: *Deleted

## 2013-12-29 MED ORDER — VALSARTAN-HYDROCHLOROTHIAZIDE 320-12.5 MG PO TABS: 1.0000 | ORAL_TABLET | Freq: Every morning | ORAL | Status: DC

## 2014-01-05 ENCOUNTER — Encounter: Payer: Self-pay | Admitting: Internal Medicine

## 2014-01-05 ENCOUNTER — Ambulatory Visit (INDEPENDENT_AMBULATORY_CARE_PROVIDER_SITE_OTHER): Payer: Medicare Other | Admitting: Internal Medicine

## 2014-01-05 VITALS — BP 155/74 | HR 55 | Temp 97.1°F | Wt 191.0 lb

## 2014-01-05 DIAGNOSIS — M519 Unspecified thoracic, thoracolumbar and lumbosacral intervertebral disc disorder: Secondary | ICD-10-CM

## 2014-01-05 DIAGNOSIS — M4640 Discitis, unspecified, site unspecified: Secondary | ICD-10-CM

## 2014-01-05 LAB — CBC WITH DIFFERENTIAL/PLATELET
BASOS ABS: 0.1 10*3/uL (ref 0.0–0.1)
BASOS PCT: 1 % (ref 0–1)
Eosinophils Absolute: 0.2 10*3/uL (ref 0.0–0.7)
Eosinophils Relative: 3 % (ref 0–5)
HCT: 35.9 % — ABNORMAL LOW (ref 39.0–52.0)
Hemoglobin: 12 g/dL — ABNORMAL LOW (ref 13.0–17.0)
LYMPHS PCT: 15 % (ref 12–46)
Lymphs Abs: 1 10*3/uL (ref 0.7–4.0)
MCH: 28.2 pg (ref 26.0–34.0)
MCHC: 33.4 g/dL (ref 30.0–36.0)
MCV: 84.3 fL (ref 78.0–100.0)
Monocytes Absolute: 0.6 10*3/uL (ref 0.1–1.0)
Monocytes Relative: 9 % (ref 3–12)
NEUTROS PCT: 72 % (ref 43–77)
Neutro Abs: 4.8 10*3/uL (ref 1.7–7.7)
PLATELETS: 239 10*3/uL (ref 150–400)
RBC: 4.26 MIL/uL (ref 4.22–5.81)
RDW: 14.8 % (ref 11.5–15.5)
WBC: 6.6 10*3/uL (ref 4.0–10.5)

## 2014-01-05 LAB — BASIC METABOLIC PANEL WITH GFR
BUN: 15 mg/dL (ref 6–23)
CO2: 28 mEq/L (ref 19–32)
Calcium: 8.3 mg/dL — ABNORMAL LOW (ref 8.4–10.5)
Chloride: 102 mEq/L (ref 96–112)
Creat: 0.8 mg/dL (ref 0.50–1.35)
GFR, EST NON AFRICAN AMERICAN: 84 mL/min
Glucose, Bld: 84 mg/dL (ref 70–99)
POTASSIUM: 3.4 meq/L — AB (ref 3.5–5.3)
SODIUM: 141 meq/L (ref 135–145)

## 2014-01-05 LAB — C-REACTIVE PROTEIN: CRP: 1 mg/dL — ABNORMAL HIGH (ref <0.60)

## 2014-01-05 LAB — CK: CK TOTAL: 120 U/L (ref 7–232)

## 2014-01-05 NOTE — Progress Notes (Signed)
Culture negative L2-L3 discitis/osteomyelitis Subjective:    Patient ID: [name] [name], male    DOB: [birth year], 80 y.o.   MRN: [id number]  HPI [name] [name] is an 80yo M with hx of HTn, HLD, chronic back pain. He has had repeat MRI that show signs of possible L2-L3 diskitis/osteomyelitis. He was originally seen by Dr. Rolena Infante who coordinated disc aspirate to send for culture. The culture were drawn off of antibiotics but yield no growth to date. Patient denies fevers chills nightsweats. Back pain is debilitating, he has decreased quality of life. Not as active as he was prior to a year ago. He denies any recent illness. He was however hospitalized for pancreatitis last summer. Did undergo ercp c/b post ercp pancreatitis as well. Not known to have bacteremia or infections at that time associated with pancreatitis. No recent dental work. He has lost #15 in the past 6 months unintentionally  5/13/15MRI: progression of severe disc space narrowing and progression of retrolisthesis now 17m at L2-L3 compared with 2011 studies. Marked endplate edema associated with t2 hyperintensity of the disc. And abnormal post-contrast enhancement have all developed/worsened since 2011. Significant left ward spurring of a chronic nature but suggestion of adjacent psoas enhance on post contrast images. assymetric left sided facet joint effusion and enhancement is observed for instance image 1-. No epidural enhancement. Mild lateral recess stenosis and foraminal narrowing is worse on the left. l2l3 diskitis/ostoe is not excluded.   5/12 sed rate 18 and crp 1.2 5/15 aspirate: no organsims, but many wbc >25/hpf. cx NGTD. Path no malignant features.  Spoke briefly with his son for roughly 15 min who provided more information on patient's health.  No Known Allergies Current Outpatient Prescriptions on File Prior to Visit  Medication Sig Dispense Refill  . acetaminophen (TYLENOL) 500 MG tablet Take 1,000 mg by mouth every 6 (six)  hours as needed for mild pain.      .Marland KitchenamLODipine (NORVASC) 5 MG tablet Take 1 tablet (5 mg total) by mouth every evening.  30 tablet  6  . aspirin 325 MG EC tablet Take 325 mg by mouth every evening.       . beclomethasone (BECONASE AQ) 42 MCG/SPRAY nasal spray Place 2 sprays into both nostrils 1 day or 1 dose. Dose is for each nostril.  25 g  12  . Cholecalciferol (VITAMIN D) 1000 UNITS capsule Take 1,000 Units by mouth daily.        . CRESTOR 20 MG tablet take 1 tablet by mouth once daily  30 tablet  2  . dutasteride (AVODART) 0.5 MG capsule Take 0.5 mg by mouth every evening.       . famotidine (PEPCID) 20 MG tablet Take 20 mg by mouth at bedtime.      . gabapentin (NEURONTIN) 300 MG capsule Take 1,800 mg by mouth 3 (three) times daily.       . Melatonin 5 MG TABS Take 5 mg by mouth at bedtime as needed (sleep).       . Menthol-Methyl Salicylate (BENGAY GREASELESS EX) Apply 1 application topically as needed (pain).      . Menthol-Methyl Salicylate (ICY HOT) 141-66% STCK Apply 1 application topically as needed (pain).      .Vladimir FasterGlycol-Propyl Glycol (SYSTANE) 0.4-0.3 % SOLN Place 1 drop into both eyes as needed (dry eye).      . Tamsulosin HCl (FLOMAX) 0.4 MG CAPS Take 0.4 mg by mouth every evening.       .Marland Kitchen  trolamine salicylate (ASPERCREME) 10 % cream Apply 1 application topically as needed for muscle pain.      . valsartan-hydrochlorothiazide (DIOVAN-HCT) 320-12.5 MG per tablet Take 1 tablet by mouth every morning.  90 tablet  1   No current facility-administered medications on file prior to visit.   Active Ambulatory Problems    Diagnosis Date Noted  . CAD (coronary artery disease)   . HTN (hypertension)   . Hyperlipidemia   . DOE (dyspnea on exertion) 11/06/2010  . PVC's (premature ventricular contractions) 11/06/2010  . S/P mitral valve repair 11/06/2010  . Fatigue 11/06/2010  . Weight loss 11/06/2010  . Cough 11/06/2010  . Left inguinal hernia 04/10/2011  . Left obturator  hernia 05/21/2011  . Right femoral hernia 05/21/2011  . Arthritis 09/30/2011  . Hyperlipidemia LDL goal < 70 09/30/2011  . Lightheadedness 11/25/2011  . PAC (premature atrial contraction) 11/17/2012  . Inguinal pain, lower right quadrant 01/31/2013  . Calculus of bile duct without mention of cholecystitis or obstruction 02/22/2013  . Urinary retention 02/24/2013  . Change in bowel habits 03/24/2013  . Wandering (atrial) pacemaker 11/22/2013   Resolved Ambulatory Problems    Diagnosis Date Noted  . Abdominal pain, epigastric 01/31/2013   Past Medical History  Diagnosis Date  . MVP (mitral valve prolapse)   . DJD (degenerative joint disease)   . BPH (benign prostatic hyperplasia)   . Lumbar disc disease   . Pancreatitis   . Gallstones    History  Substance Use Topics  . Smoking status: Never Smoker   . Smokeless tobacco: Never Used  . Alcohol Use: 0.0 oz/week    1-2 Glasses of wine per week     Comment: rare alcohol use  family history includes Stroke in his father and mother.  Review of Systems 10 point ros is reviewed, positive pertinents listed above in hpi    Objective:   Physical Exam BP 155/74  Pulse 55  Temp(Src) 97.1 F (36.2 C) (Oral)  Wt 191 lb (86.637 kg) Physical Exam  Constitutional: He is oriented to person, place, and time. He appears well-developed and well-nourished. No distress.wearing backbrace  HENT:  Mouth/Throat: Oropharynx is clear and moist. No oropharyngeal exudate.  Cardiovascular: Normal rate, regular rhythm and normal heart sounds. Exam reveals no gallop and no friction rub.  No murmur heard.  Pulmonary/Chest: Effort normal and breath sounds normal. No respiratory distress. He has no wheezes.  Abdominal: Soft. Bowel sounds are normal. He exhibits no distension. There is no tenderness.  Lymphadenopathy:  He has no cervical adenopathy.  Neurological: He is alert and oriented to person, place, and time. Slow to move, slightly restrictive  gait thought to be limited by pain. Skin: Skin is warm and dry. No rash noted. No erythema.  Psychiatric: He has a normal mood and affect. His behavior is normal.   Lab Results  Component Value Date   WBC 6.6 01/05/2014   HGB 12.0* 01/05/2014   HCT 35.9* 01/05/2014   MCV 84.3 01/05/2014   PLT 239 01/05/2014   Lab Results  Component Value Date   ESRSEDRATE 13 01/05/2014   Lab Results  Component Value Date   CRP 1.0* 01/05/2014   BMET    Component Value Date/Time   NA 141 01/05/2014 1546   K 3.4* 01/05/2014 1546   CL 102 01/05/2014 1546   CO2 28 01/05/2014 1546   GLUCOSE 84 01/05/2014 1546   BUN 15 01/05/2014 1546   CREATININE 0.80 01/05/2014 1546   CREATININE  1.10 12/21/2013 1037   CALCIUM 8.3* 01/05/2014 1546   GFRNONAA 84 01/05/2014 1546   GFRNONAA 67* 10/03/2013 2140   GFRAA >89 01/05/2014 1546   GFRAA 77* 10/03/2013 2140        Assessment & Plan:  80yo M with progressive back pain found to have incidentally on imaging l2-l3 changes concerning for possible early discitis. Aspirate had numerous WBC suggestive of inflammation but no bacteria isolated.  - discussed pros and cons of deferring treatment but also would it hinder further work up or intervention by orthopedics. We have decided to go ahead and treat as presumable diskitis. Will get picc line arranged and do a course of daptomycin 87m/kg for gram positive and ceftriaxone 2gm iv daily for gram negative coverage as an empiric regimen. i have avoided using vancomycin in order to avoid AKI in octagenarian. We will get baseline labs and ck level. Will plan for 6 wk course. Home health to do weekly assessment and labs vs. Going to infusion center.  rtc in 4-6 wk

## 2014-01-06 ENCOUNTER — Other Ambulatory Visit: Payer: Self-pay | Admitting: Internal Medicine

## 2014-01-06 DIAGNOSIS — M4640 Discitis, unspecified, site unspecified: Secondary | ICD-10-CM

## 2014-01-06 LAB — SEDIMENTATION RATE: SED RATE: 13 mm/h (ref 0–16)

## 2014-01-09 ENCOUNTER — Telehealth: Payer: Self-pay | Admitting: *Deleted

## 2014-01-09 NOTE — Telephone Encounter (Signed)
Patient called and advised he has an appt for his PICC placement 01/10/14 at 230 pm. He advised he is waiting to hear from Dr Baxter Flattery as to if it is cheaper to get his medication at home or at the hospital daily at the infusion center. Advised the patient his information has been sent to [facility] and he is set to have his medication and training through them. He advised his is not sure he wants to have it done at home depends on the cost. Advised him I am not sure which way is better and until we know we can stop the placement as they will not place it and not have any medication for the patient. He advised this is not that he talked to the doctor about and is confused. He advised he will just keep his appt and we can figure this out later. Advised him that is not how this works and if he does not want to get his medication from [facility] we will have to cancle the PICC until we can get this straightened out. He advised to leave things the way they are and he will talk to the doctor later he needs to get on this medication. Advised him I will let the doctor know.

## 2014-01-10 ENCOUNTER — Other Ambulatory Visit: Payer: Self-pay | Admitting: Internal Medicine

## 2014-01-10 ENCOUNTER — Non-Acute Institutional Stay (HOSPITAL_COMMUNITY)
Admission: AD | Admit: 2014-01-10 | Discharge: 2014-01-10 | Disposition: A | Discharge status: Home / Self Care | Payer: Medicare Other | Source: Ambulatory Visit | Attending: Internal Medicine | Admitting: Internal Medicine

## 2014-01-10 ENCOUNTER — Ambulatory Visit (HOSPITAL_COMMUNITY)
Admission: RE | Admit: 2014-01-10 | Discharge: 2014-01-10 | Disposition: A | Discharge status: Home / Self Care | Payer: Medicare Other | Source: Ambulatory Visit | Attending: Internal Medicine | Admitting: Internal Medicine

## 2014-01-10 DIAGNOSIS — M519 Unspecified thoracic, thoracolumbar and lumbosacral intervertebral disc disorder: Secondary | ICD-10-CM | POA: Insufficient documentation

## 2014-01-10 DIAGNOSIS — M4640 Discitis, unspecified, site unspecified: Secondary | ICD-10-CM

## 2014-01-10 DIAGNOSIS — M869 Osteomyelitis, unspecified: Secondary | ICD-10-CM | POA: Insufficient documentation

## 2014-01-10 MED ORDER — LIDOCAINE HCL 1 % IJ SOLN
INTRAMUSCULAR | Status: AC
  Filled 2014-01-10: qty 20

## 2014-01-10 MED ORDER — SODIUM CHLORIDE 0.9 % IV SOLN
500.0000 mg | INTRAVENOUS | Status: DC
  Administered 2014-01-10: 500 mg via INTRAVENOUS
  Filled 2014-01-10: qty 10

## 2014-01-10 MED ORDER — HEPARIN SOD (PORK) LOCK FLUSH 100 UNIT/ML IV SOLN
250.0000 [IU] | INTRAVENOUS | Status: AC | PRN
  Administered 2014-01-10: 250 [IU]
  Filled 2014-01-10: qty 5

## 2014-01-10 MED ORDER — SODIUM CHLORIDE 0.9 % IJ SOLN
10.0000 mL | INTRAMUSCULAR | Status: AC | PRN
  Administered 2014-01-10: 10 mL

## 2014-01-10 MED ORDER — HEPARIN SOD (PORK) LOCK FLUSH 100 UNIT/ML IV SOLN
INTRAVENOUS | Status: AC
  Filled 2014-01-10: qty 5

## 2014-01-10 MED ORDER — DEXTROSE 5 % IV SOLN
2.0000 g | INTRAVENOUS | Status: DC
  Administered 2014-01-10: 2 g via INTRAVENOUS
  Filled 2014-01-10: qty 2

## 2014-01-10 NOTE — Procedures (Signed)
[facility]  Procedure Note  [name] [name] OFH:[id number] DOB: July 24, 1930 DOA: 01/10/2014   Dr. Baxter Flattery  Associated Diagnosis: Discitis  Procedure Note: PICC line flushed and accessed, IV antibiotics infused per order, PICC line flushed per protocol   Condition During Procedure: Patient tolerated infusion without difficulty   Condition at Discharge: Patient stable, wife at bedside.   [name], RN  [facility]

## 2014-01-10 NOTE — Procedures (Signed)
Successful placement of right basilic vein approach 40 cm single lumen PICC line with tip at the superior caval-atrial junction.  The PICC line is ready for immediate use.

## 2014-01-11 ENCOUNTER — Other Ambulatory Visit: Payer: Self-pay | Admitting: Student

## 2014-01-11 ENCOUNTER — Other Ambulatory Visit: Payer: Self-pay | Admitting: Internal Medicine

## 2014-01-11 DIAGNOSIS — M4640 Discitis, unspecified, site unspecified: Secondary | ICD-10-CM

## 2014-01-23 ENCOUNTER — Telehealth: Payer: Self-pay | Admitting: Cardiology

## 2014-01-23 NOTE — Telephone Encounter (Signed)
Follow Up    Pt returning call from earlier. Please call.

## 2014-01-23 NOTE — Telephone Encounter (Signed)
lmtcb Debbie Kawon Willcutt RN  

## 2014-01-23 NOTE — Telephone Encounter (Signed)
[name] [name], HHN,  Calls today b/c pt has  - increased edema of the bilateral lower extremities  2+ - edema of the hand  - states lungs are clear - denies shortness of breath  -pt does not weigh but last know was 189.   States he will weigh daily -pt started elevating legs  Pt is receiving IV antibiotic infusions. He had another 3 weeks of this therapy. HHN thinks the edema might be due to the saline flushes? Will forward to Dr. [place] for review Horton Chin RN

## 2014-01-23 NOTE — Telephone Encounter (Signed)
New message     Pt c/o increased edema on legs and gets worse at night.  Pt is on IV antibiotic and is doing an IV flush.  No sob vital are stable   (130/78 bp, pulse 61, resp 18, oxy 99 and wt 189) wanted to report this to the doctor.

## 2014-01-24 ENCOUNTER — Telehealth: Payer: Self-pay | Admitting: Family Medicine

## 2014-01-24 NOTE — Telephone Encounter (Signed)
Spoke to J[employer] and parking placard approved and copy made and given to pt.

## 2014-01-24 NOTE — Telephone Encounter (Signed)
Pt is here with his wife (in a back brace) is requesting a parking placard be filled out.

## 2014-01-26 ENCOUNTER — Telehealth: Payer: Self-pay | Admitting: Licensed Clinical Social Worker

## 2014-01-26 NOTE — Telephone Encounter (Signed)
error 

## 2014-02-16 ENCOUNTER — Other Ambulatory Visit: Payer: Self-pay

## 2014-02-16 MED ORDER — AMLODIPINE BESYLATE 5 MG PO TABS: 5.0000 mg | ORAL_TABLET | Freq: Every evening | ORAL | Status: DC

## 2014-02-21 ENCOUNTER — Ambulatory Visit (INDEPENDENT_AMBULATORY_CARE_PROVIDER_SITE_OTHER): Payer: Medicare Other | Admitting: Internal Medicine

## 2014-02-21 ENCOUNTER — Telehealth: Payer: Self-pay | Admitting: Family Medicine

## 2014-02-21 ENCOUNTER — Telehealth: Payer: Self-pay | Admitting: *Deleted

## 2014-02-21 VITALS — BP 151/66 | HR 59 | Temp 98.1°F | Ht 74.0 in | Wt 179.0 lb

## 2014-02-21 DIAGNOSIS — M4640 Discitis, unspecified, site unspecified: Secondary | ICD-10-CM

## 2014-02-21 DIAGNOSIS — M4626 Osteomyelitis of vertebra, lumbar region: Secondary | ICD-10-CM

## 2014-02-21 DIAGNOSIS — M519 Unspecified thoracic, thoracolumbar and lumbosacral intervertebral disc disorder: Secondary | ICD-10-CM

## 2014-02-21 DIAGNOSIS — M869 Osteomyelitis, unspecified: Secondary | ICD-10-CM

## 2014-02-21 MED ORDER — SULFAMETHOXAZOLE-TMP DS 800-160 MG PO TABS: 1.0000 | ORAL_TABLET | Freq: Two times a day (BID) | ORAL | Status: DC

## 2014-02-21 NOTE — Telephone Encounter (Signed)
Gave verbal order to pull PICC on 02/22/14 to [place] at [facility].   Landis Gandy, RN

## 2014-02-21 NOTE — Progress Notes (Signed)
Subjective:    Patient ID: [name] [name], male    DOB: 10-May-1930, 80 y.o.   MRN: [id number]  HPI [name] [name] is an 80yo M with hx of HTn, HLD, chronic back pain followed routinely by orthopedics. He has had repeat MRI that show signs of possible L2-L3 diskitis/osteomyelitis. He had IR aspirate that showed NGTD. Patient denies fevers chills nightsweats. Back pain is debilitating. The 12/21/13 MRI: progression of severe disc space narrowing and progression of retrolisthesis now 26m at L2-L3 compared with 2011 studies. Marked endplate edema associated with t2 hyperintensity of the disc. And abnormal post-contrast enhancement have all developed/worsened since 2011. Significant left ward spurring of a chronic nature but suggestion of adjacent psoas enhance on post contrast images. assymetric left sided facet joint effusion and enhancement is observed for instance image 1-. No epidural enhancement. Mild lateral recess stenosis and foraminal narrowing is worse on the left.  5/12 sed rate 18 and crp 1.2  5/15 aspirate: no organsims, but many wbc >25/hpf. cx NGTD. Path no malignant features.  Decision was made to treat for early discitis/osteomyelitis with 6 wk of iv vancomycin and ceftriaxone which he tolerated without difficulty. He has noticed occassional lower extremity swelling accumulates over the day, disappears overnight.  His hip pain is not significantly improved. Current Outpatient Prescriptions on File Prior to Visit  Medication Sig Dispense Refill  . acetaminophen (TYLENOL) 500 MG tablet Take 1,000 mg by mouth every 6 (six) hours as needed for mild pain.      .Marland KitchenamLODipine (NORVASC) 5 MG tablet Take 1 tablet (5 mg total) by mouth every evening.  30 tablet  6  . aspirin 325 MG EC tablet Take 325 mg by mouth every evening.       . Cholecalciferol (VITAMIN D) 1000 UNITS capsule Take 1,000 Units by mouth daily.        .Marland Kitchendutasteride (AVODART) 0.5 MG capsule Take 0.5 mg by mouth every evening.         . famotidine (PEPCID) 20 MG tablet Take 20 mg by mouth at bedtime.      . gabapentin (NEURONTIN) 300 MG capsule Take 600 mg by mouth 3 (three) times daily.       . Melatonin 5 MG TABS Take 5 mg by mouth at bedtime as needed (sleep).       . Menthol-Methyl Salicylate (BENGAY GREASELESS EX) Apply 1 application topically as needed (pain).      . Menthol-Methyl Salicylate (ICY HOT) 186-75% STCK Apply 1 application topically as needed (pain).      .Vladimir FasterGlycol-Propyl Glycol (SYSTANE) 0.4-0.3 % SOLN Place 1 drop into both eyes as needed (dry eye).      . Tamsulosin HCl (FLOMAX) 0.4 MG CAPS Take 0.4 mg by mouth every evening.       . trolamine salicylate (ASPERCREME) 10 % cream Apply 1 application topically as needed for muscle pain.      . valsartan-hydrochlorothiazide (DIOVAN-HCT) 320-12.5 MG per tablet Take 1 tablet by mouth every morning.  90 tablet  1  . beclomethasone (BECONASE AQ) 42 MCG/SPRAY nasal spray Place 2 sprays into both nostrils 1 day or 1 dose. Dose is for each nostril.  25 g  12  . CRESTOR 20 MG tablet take 1 tablet by mouth once daily  30 tablet  2   No current facility-administered medications on file prior to visit.   Active Ambulatory Problems    Diagnosis Date Noted  . CAD (coronary artery  disease)   . HTN (hypertension)   . Hyperlipidemia   . DOE (dyspnea on exertion) 11/06/2010  . PVC's (premature ventricular contractions) 11/06/2010  . S/P mitral valve repair 11/06/2010  . Fatigue 11/06/2010  . Weight loss 11/06/2010  . Cough 11/06/2010  . Left inguinal hernia 04/10/2011  . Left obturator hernia 05/21/2011  . Right femoral hernia 05/21/2011  . Arthritis 09/30/2011  . Hyperlipidemia LDL goal < 70 09/30/2011  . Lightheadedness 11/25/2011  . PAC (premature atrial contraction) 11/17/2012  . Inguinal pain, lower right quadrant 01/31/2013  . Calculus of bile duct without mention of cholecystitis or obstruction 02/22/2013  . Urinary retention 02/24/2013  .  Change in bowel habits 03/24/2013  . Wandering (atrial) pacemaker 11/22/2013   Resolved Ambulatory Problems    Diagnosis Date Noted  . Abdominal pain, epigastric 01/31/2013   Past Medical History  Diagnosis Date  . MVP (mitral valve prolapse)   . DJD (degenerative joint disease)   . BPH (benign prostatic hyperplasia)   . Lumbar disc disease   . Pancreatitis   . Gallstones      Review of Systems 10 point ros is reviewed, positive pertinents listed in hpi    Objective:   Physical Exam BP 151/66  Pulse 59  Temp(Src) 98.1 F (36.7 C) (Oral)  Ht 6' 2"  (1.88 m)  Wt 179 lb (81.194 kg)  BMI 22.97 kg/m2 Physical Exam  Constitutional: He is oriented to person, place, and time. He appears well-developed and well-nourished. No distress. Wearing a brace HENT:  Mouth/Throat: Oropharynx is clear and moist. No oropharyngeal exudate.  Skin: Skin is warm and dry. No rash noted. No erythema.       Assessment & Plan:  -to determine his response to treatment and if inflammation decreased after receiving antibiotics, we will need repeat mri, since back pain is also present but location is not where we would expect for his osteomyelits - pull picc line since no longer needs IV antibiotics - follow up in 4 wk - transition to bactrim ds bid - we will refer him to pain management referral to dr. Hardin Negus

## 2014-03-02 NOTE — Telephone Encounter (Signed)
fyi

## 2014-03-07 ENCOUNTER — Encounter: Payer: Self-pay | Admitting: Family Medicine

## 2014-03-07 ENCOUNTER — Ambulatory Visit (INDEPENDENT_AMBULATORY_CARE_PROVIDER_SITE_OTHER): Payer: Medicare Other | Admitting: Family Medicine

## 2014-03-07 ENCOUNTER — Telehealth: Payer: Self-pay | Admitting: *Deleted

## 2014-03-07 VITALS — BP 126/80 | HR 76 | Wt 189.0 lb

## 2014-03-07 DIAGNOSIS — R609 Edema, unspecified: Secondary | ICD-10-CM

## 2014-03-07 NOTE — Progress Notes (Signed)
   Subjective:    Patient ID: [name] [name], male    DOB: [birth year], 80 y.o.   MRN: [id number]  HPI He is here for consult concerning swelling in his lower legs He recently was diagnosed and treated for a spinal infection. He was placed on IV antibiotics for over 6 weeks and followed up with oral antibiotics. During that time he noted the onset of bilateral foot edema. He also notes a tingling sensation in his feet as well. He does admit to very sedentary lifestyle. He has been elevating his feet as much is possible. His had no chest pain, shortness of breath, PND or orthopnea. He notes that since stopping the IV antibiotics which she apparently also needed fluids with, the swelling has gotten slightly better.   Review of Systems     Objective:   Physical Exam Alert and in no distress. Lower extremity exam shows 2+ pitting edema in his feet only with good pulses and sensation.       Assessment & Plan:  Dependent edema  recommend he elevate his feet as much as possible and use support hose. Discussed use of diuretic but at this point do not think it is appropriate. He will let he know how this works.

## 2014-03-07 NOTE — Patient Instructions (Signed)
Elevate your feet as much is possible and use elastic stockings as soon as you get out of bed in the morning

## 2014-03-07 NOTE — Telephone Encounter (Signed)
Patient has MRI scheduled via Dr. Valla Leaver office for 8/28.  RN asked patient's wife to make sure that the results were forwarded to Dr. Baxter Flattery for review. Landis Gandy, RN

## 2014-03-09 ENCOUNTER — Telehealth: Payer: Self-pay | Admitting: Cardiology

## 2014-03-14 NOTE — Telephone Encounter (Signed)
Closed enounter °

## 2014-03-15 ENCOUNTER — Ambulatory Visit (HOSPITAL_COMMUNITY): Payer: Medicare Other

## 2014-03-31 ENCOUNTER — Encounter: Payer: Self-pay | Admitting: Family Medicine

## 2014-03-31 ENCOUNTER — Ambulatory Visit (INDEPENDENT_AMBULATORY_CARE_PROVIDER_SITE_OTHER): Payer: Medicare Other | Admitting: Family Medicine

## 2014-03-31 ENCOUNTER — Ambulatory Visit
Admission: RE | Admit: 2014-03-31 | Discharge: 2014-03-31 | Disposition: A | Discharge status: Home / Self Care | Payer: Medicare Other | Source: Ambulatory Visit | Attending: Family Medicine | Admitting: Family Medicine

## 2014-03-31 VITALS — BP 140/90 | Wt 187.0 lb

## 2014-03-31 DIAGNOSIS — M25552 Pain in left hip: Secondary | ICD-10-CM

## 2014-03-31 DIAGNOSIS — M25559 Pain in unspecified hip: Secondary | ICD-10-CM

## 2014-03-31 MED ORDER — TRAMADOL HCL 50 MG PO TABS: 50.0000 mg | ORAL_TABLET | Freq: Three times a day (TID) | ORAL | Status: DC | PRN

## 2014-04-01 NOTE — Progress Notes (Signed)
   Subjective:    Patient ID: [name] [name], male    DOB: 21-Apr-1930, 80 y.o.   MRN: [id number]  HPI He is here for evaluation of left hip pain. He has had previous difficulty with back and right hip pain and is being treated for this. He does have this is been going on for several days. He especially notes difficulty going from sitting to standing position. When he is up walking around, the pain is minimal at best.   Review of Systems     Objective:   Physical Exam Alert and in no distress. He is noted to wince when he goes from the sitting to standing position. No tenderness over greater trochanter. No tenderness over SI joint. Hip motion didn't cause some slight discomfort but the motion is fairly good in all directions. X-rays were ordered which did show some minimal arthritis.       Assessment & Plan:  Left hip pain - Plan: DG Hip Complete Left, traMADol (ULTRAM) 50 MG tablet  will treat him with Ultram. He continues to have difficulty, further orthopedic evaluation will be needed.

## 2014-04-03 ENCOUNTER — Telehealth: Payer: Self-pay | Admitting: Family Medicine

## 2014-04-03 ENCOUNTER — Encounter: Payer: Self-pay | Admitting: Internal Medicine

## 2014-04-03 ENCOUNTER — Ambulatory Visit (INDEPENDENT_AMBULATORY_CARE_PROVIDER_SITE_OTHER): Payer: Medicare Other | Admitting: Internal Medicine

## 2014-04-03 VITALS — BP 144/79 | HR 69 | Temp 97.7°F | Wt 188.0 lb

## 2014-04-03 DIAGNOSIS — M4646 Discitis, unspecified, lumbar region: Secondary | ICD-10-CM

## 2014-04-03 DIAGNOSIS — M519 Unspecified thoracic, thoracolumbar and lumbosacral intervertebral disc disorder: Secondary | ICD-10-CM

## 2014-04-03 LAB — CBC WITH DIFFERENTIAL/PLATELET
Basophils Absolute: 0.1 10*3/uL (ref 0.0–0.1)
Basophils Relative: 1 % (ref 0–1)
Eosinophils Absolute: 0.1 10*3/uL (ref 0.0–0.7)
Eosinophils Relative: 2 % (ref 0–5)
HCT: 39.1 % (ref 39.0–52.0)
HEMOGLOBIN: 13.4 g/dL (ref 13.0–17.0)
LYMPHS ABS: 1 10*3/uL (ref 0.7–4.0)
Lymphocytes Relative: 18 % (ref 12–46)
MCH: 29.6 pg (ref 26.0–34.0)
MCHC: 34.3 g/dL (ref 30.0–36.0)
MCV: 86.3 fL (ref 78.0–100.0)
MONOS PCT: 11 % (ref 3–12)
Monocytes Absolute: 0.6 10*3/uL (ref 0.1–1.0)
NEUTROS PCT: 68 % (ref 43–77)
Neutro Abs: 3.7 10*3/uL (ref 1.7–7.7)
Platelets: 208 10*3/uL (ref 150–400)
RBC: 4.53 MIL/uL (ref 4.22–5.81)
RDW: 14.8 % (ref 11.5–15.5)
WBC: 5.5 10*3/uL (ref 4.0–10.5)

## 2014-04-03 NOTE — Telephone Encounter (Signed)
Pt notified of recomendations

## 2014-04-03 NOTE — Telephone Encounter (Signed)
Pt called and requested his xray results. Please call when reviewed. Pt can be reached at [phone].

## 2014-04-03 NOTE — Telephone Encounter (Signed)
Let him know that he has minimal arthritic changes and if he continues to have difficulty, have him check with his orthopedic surgeon concerning their input into it.

## 2014-04-03 NOTE — Progress Notes (Signed)
Subjective:    Patient ID: [name] [name], male    DOB: 07/01/1930, 80 y.o.   MRN: [id number]  HPI [name] is an 80yo M with hx of HTn, HLD, chronic back pain followed routinely by orthopedics. He has had repeat MRI that show signs of possible L2-L3 diskitis/osteomyelitis. He had IR aspirate that showed NGTD. Patient denies fevers chills nightsweats. Back pain is debilitating. The  12/21/13 MRI: progression of severe disc space narrowing and progression of retrolisthesis now 59m at L2-L3 compared with 2011 studies. Marked endplate edema associated with t2 hyperintensity of the disc. And abnormal post-contrast enhancement have all developed/worsened since 2011. Significant left ward spurring of a chronic nature but suggestion of adjacent psoas enhance on post contrast images. assymetric left sided facet joint effusion and enhancement is observed for instance image 1-. No epidural enhancement. Mild lateral recess stenosis and foraminal narrowing is worse on the left.  5/12 sed rate 18 and crp 1.2  5/15 aspirate: no organsims, but many wbc >25/hpf. cx NGTD. Path no malignant features.  Decision was made to treat for early discitis/osteomyelitis with 6 wk of iv vancomycin and ceftriaxone which he tolerated without difficulty and ended in mid July. He has been transitioned to bactrim ds bid fro the past 4 wks but off for the past week, did not seek refill yet.  He has noticed in the last 10-14 days having new left hip pain (not chronic right hip). He notices most from sitting to standing or raising his left leg. He states it is intermittent but can be sharp 10 of 10 pain. Sharp at inguinal crease, occasionally radiating lateral thigh  He has upcoming mri of lumbar area next week coordinated by dr. bRolena Infante   Review of Systems Review of Systems  Constitutional: Negative for fever, chills, diaphoresis, activity change, appetite change, fatigue and unexpected weight change.  HENT: Negative for  congestion, sore throat, rhinorrhea, sneezing, trouble swallowing and sinus pressure.  Eyes: Negative for photophobia and visual disturbance.  Respiratory: Negative for cough, chest tightness, shortness of breath, wheezing and stridor.  Cardiovascular: Negative for chest pain, palpitations and leg swelling.  Gastrointestinal: Negative for nausea, vomiting, abdominal pain, diarrhea, constipation, blood in stool, abdominal distention and anal bleeding.  Genitourinary: Negative for dysuria, hematuria, flank pain and difficulty urinating.  Musculoskeletal: Negative for myalgias, back pain, joint swelling, arthralgias and gait problem.  Skin: Negative for color change, pallor, rash and wound.  Neurological: Negative for dizziness, tremors, weakness and light-headedness.  Hematological: Negative for adenopathy. Does not bruise/bleed easily.  Psychiatric/Behavioral: Negative for behavioral problems, confusion, sleep disturbance, dysphoric mood, decreased concentration and agitation.       Objective:   Physical Exam  BP 144/79  Pulse 69  Temp(Src) 97.7 F (36.5 C) (Oral)  Wt 188 lb (85.276 kg) Physical Exam  Constitutional: He is oriented to person, place, and time. He appears well-developed and well-nourished. No distress.  HENT:  Mouth/Throat: Oropharynx is clear and moist. No oropharyngeal exudate.  Neurological: He is alert and oriented to person, place, and time.  Skin: Skin is warm and dry. No rash noted. No erythema. + parynochia in left middle finger medial part of nail bed bilaterally Psychiatric: He has a normal mood and affect. His behavior is normal.  Ext: pain with raising left leg   Lab Results  Component Value Date   ESRSEDRATE 1 04/03/2014   Lab Results  Component Value Date   CRP <0.5 04/03/2014  Assessment & Plan:  Discitis = will ask him to do 4 more weeks of bactrim ds bid. Will check sed rate and crp  New left hip pain = ? Tendonitis. Will ask dr. Rolena Infante  to see if he can assess patient to see if he needs further imaging

## 2014-04-04 LAB — BASIC METABOLIC PANEL
BUN: 19 mg/dL (ref 6–23)
CO2: 31 mEq/L (ref 19–32)
CREATININE: 1.07 mg/dL (ref 0.50–1.35)
Calcium: 8.7 mg/dL (ref 8.4–10.5)
Chloride: 103 mEq/L (ref 96–112)
Glucose, Bld: 96 mg/dL (ref 70–99)
Potassium: 3.9 mEq/L (ref 3.5–5.3)
Sodium: 141 mEq/L (ref 135–145)

## 2014-04-04 LAB — C-REACTIVE PROTEIN: CRP: <0.5 mg/dL (ref <0.60)

## 2014-04-04 LAB — SEDIMENTATION RATE: SED RATE: 1 mm/h (ref 0–16)

## 2014-04-12 ENCOUNTER — Encounter: Payer: Self-pay | Admitting: Family Medicine

## 2014-04-21 ENCOUNTER — Other Ambulatory Visit: Payer: Self-pay | Admitting: *Deleted

## 2014-04-21 DIAGNOSIS — M4640 Discitis, unspecified, site unspecified: Secondary | ICD-10-CM

## 2014-04-21 MED ORDER — SULFAMETHOXAZOLE-TMP DS 800-160 MG PO TABS: 1.0000 | ORAL_TABLET | Freq: Two times a day (BID) | ORAL | Status: DC

## 2014-05-01 ENCOUNTER — Ambulatory Visit (INDEPENDENT_AMBULATORY_CARE_PROVIDER_SITE_OTHER): Payer: Medicare Other | Admitting: Internal Medicine

## 2014-05-01 ENCOUNTER — Encounter: Payer: Self-pay | Admitting: Internal Medicine

## 2014-05-01 VITALS — BP 149/79 | HR 62 | Temp 98.2°F | Wt 191.0 lb

## 2014-05-01 DIAGNOSIS — M4626 Osteomyelitis of vertebra, lumbar region: Secondary | ICD-10-CM

## 2014-05-01 DIAGNOSIS — M869 Osteomyelitis, unspecified: Secondary | ICD-10-CM

## 2014-05-01 NOTE — Progress Notes (Signed)
   Subjective:    Patient ID: [name] [name], male    DOB: 11-03-1929, 80 y.o.   MRN: [id number]  HPI 80yo M with hx of HTn, HLD, chronic back pain followed routinely by orthopedics. He has had repeat MRI that show signs of possible L2-L3 diskitis/osteomyelitis treated with 6 wk of iv ceftriaxone and vancomycin followed by 4 wk of oral bactrim. Treated as culture negative since IR aspirate that showed NGTD. Patient denies fevers chills nightsweats. Back pain is debilitating.   12/21/13 MRI: progression of severe disc space narrowing and progression of retrolisthesis now 35m at L2-L3 compared with 2011 studies. Marked endplate edema associated with t2 hyperintensity of the disc. And abnormal post-contrast enhancement have all developed/worsened since 2011. Significant left ward spurring of a chronic nature but suggestion of adjacent psoas enhance on post contrast images. assymetric left sided facet joint effusion and enhancement is observed for instance image 1-. No epidural enhancement. Mild lateral recess stenosis and foraminal narrowing is worse on the left.    Review of Systems + still have chronic hip pain of right and left hip    Objective:   Physical Exam BP 149/79  Pulse 62  Temp(Src) 98.2 F (36.8 C) (Oral)  Wt 191 lb (86.637 kg) Physical Exam  Constitutional: He is oriented to person, place, and time. He appears well-developed and well-nourished. No distress.  HENT:  Mouth/Throat: Oropharynx is clear and moist. No oropharyngeal exudate.  Cardiovascular: Normal rate, regular rhythm and normal heart sounds. Exam reveals no gallop and no friction rub.  No murmur heard.  Pulmonary/Chest: Effort normal and breath sounds normal. No respiratory distress. He has no wheezes.  Abdominal: Soft. Bowel sounds are normal. He exhibits no distension. There is no tenderness.  Lymphadenopathy:  He has no cervical adenopathy.  Neurological: He is alert and oriented to person, place, and time.    Skin: Skin is warm and dry. No rash noted. No erythema.  Psychiatric: He has a normal mood and affect. His behavior is normal.   Lab Results  Component Value Date   ESRSEDRATE 1 04/03/2014   Lab Results  Component Value Date   CRP <0.5 04/03/2014        Assessment & Plan:  Diskitis = presumably treated with 10-12 wk course of antibiotics. Repeat imagine per patient report shows mild improvement. Will stop antibiotics and not reinitiate  Peripheral neuropathy = defer to pcp   Health maintenance = will give flu shot

## 2014-05-14 ENCOUNTER — Other Ambulatory Visit: Payer: Self-pay | Admitting: Family Medicine

## 2014-05-16 ENCOUNTER — Encounter: Payer: Self-pay | Admitting: Family Medicine

## 2014-05-16 ENCOUNTER — Ambulatory Visit (INDEPENDENT_AMBULATORY_CARE_PROVIDER_SITE_OTHER): Payer: Medicare Other | Admitting: Family Medicine

## 2014-05-16 VITALS — BP 118/70 | HR 86 | Wt 195.0 lb

## 2014-05-16 DIAGNOSIS — G629 Polyneuropathy, unspecified: Secondary | ICD-10-CM

## 2014-05-16 DIAGNOSIS — L03019 Cellulitis of unspecified finger: Secondary | ICD-10-CM

## 2014-05-16 DIAGNOSIS — IMO0002 Reserved for concepts with insufficient information to code with codable children: Secondary | ICD-10-CM

## 2014-05-16 MED ORDER — DOXYCYCLINE HYCLATE 100 MG PO TABS: 100.0000 mg | ORAL_TABLET | Freq: Two times a day (BID) | ORAL | Status: DC

## 2014-05-16 MED ORDER — GABAPENTIN 800 MG PO TABS: 800.0000 mg | ORAL_TABLET | Freq: Three times a day (TID) | ORAL | Status: DC

## 2014-05-16 NOTE — Progress Notes (Signed)
   Subjective:    Patient ID: [name] [name], male    DOB: [birth year], 80 y.o.   MRN: [id number]  HPI He is here for consult concerning swelling at the cuticle of several of the fingers on his hands. He has had no drainage from these areas but they do occasionally cause some discomfort. His had only minimal nail changes. He did get online and look concerning this. He also continues to have difficulty with his lower extremities. He is now experiencing some numbness/pain sensation in his legs it is interfering with sleep. He also is noted some burning sensation in his hands on a couple of occasions. He is presently on Neurontin 600 3 times a day.   Review of Systems     Objective:   Physical Exam Exam of his hands does several cuticle to be erythematous however no evidence of a true paronychia was seen. The nails appear normal except for the index finger on the left which did show some damage to the nailbed laterally.      Assessment & Plan:  Peripheral neuropathy - Plan: gabapentin (NEURONTIN) 800 MG tablet  Paronychia, unspecified laterality - Plan: doxycycline (VIBRA-TABS) 100 MG tablet  I will treat this as if it's an early paronychia with an antibiotic to see if this will help. I will also increase his Neurontin to 800 3 times a day. If he does not respond, we may consider switching him to Lyrica. He had several questions concerning both of these and all questions were answered. Over 20 minutes spent discussing all these issues with him.

## 2014-05-24 ENCOUNTER — Encounter: Payer: Self-pay | Admitting: Cardiology

## 2014-05-24 ENCOUNTER — Ambulatory Visit (INDEPENDENT_AMBULATORY_CARE_PROVIDER_SITE_OTHER): Payer: Medicare Other | Admitting: Cardiology

## 2014-05-24 VITALS — BP 126/86 | HR 58 | Ht 74.0 in | Wt 196.9 lb

## 2014-05-24 DIAGNOSIS — I10 Essential (primary) hypertension: Secondary | ICD-10-CM

## 2014-05-24 DIAGNOSIS — I499 Cardiac arrhythmia, unspecified: Secondary | ICD-10-CM

## 2014-05-24 DIAGNOSIS — I2510 Atherosclerotic heart disease of native coronary artery without angina pectoris: Secondary | ICD-10-CM

## 2014-05-24 DIAGNOSIS — E785 Hyperlipidemia, unspecified: Secondary | ICD-10-CM

## 2014-05-24 DIAGNOSIS — I498 Other specified cardiac arrhythmias: Secondary | ICD-10-CM

## 2014-05-24 DIAGNOSIS — Z9889 Other specified postprocedural states: Secondary | ICD-10-CM

## 2014-05-24 NOTE — Patient Instructions (Signed)
Continue your current therapy  I will see you in 6 months.   

## 2014-05-24 NOTE — Progress Notes (Signed)
[name] Date of Birth: [birth year]   History of Present Illness: [name] is seen today for followup. He is status post mitral valve repair with single vessel coronary bypass surgery in 2002. Follow up Echo in April 2012 showed a good MV repair. EF was 55%. He also has a history of HTN and PVC/PACs. In May he was diagnosed with a spinal infection. He was treated with 6 weeks of IV antibiotics then 60 days of oral. He had to wear a back brace. Now able to do some water exercises. Denies chest pain or SOB. Has noticed a little swelling in feet R>L. Notes episodic numbness of entire right leg.  Current Outpatient Prescriptions on File Prior to Visit  Medication Sig Dispense Refill  . acetaminophen (TYLENOL) 500 MG tablet Take 1,000 mg by mouth every 6 (six) hours as needed for mild pain.      Marland Kitchen amLODipine (NORVASC) 5 MG tablet Take 1 tablet (5 mg total) by mouth every evening.  30 tablet  6  . aspirin 325 MG EC tablet Take 325 mg by mouth every evening.       . beclomethasone (BECONASE AQ) 42 MCG/SPRAY nasal spray Place 2 sprays into both nostrils 1 day or 1 dose. Dose is for each nostril.  25 g  12  . Cholecalciferol (VITAMIN D) 1000 UNITS capsule Take 1,000 Units by mouth daily.        . CRESTOR 20 MG tablet take 1 tablet by mouth once daily  30 tablet  0  . doxycycline (VIBRA-TABS) 100 MG tablet Take 1 tablet (100 mg total) by mouth 2 (two) times daily.  20 tablet  0  . dutasteride (AVODART) 0.5 MG capsule Take 0.5 mg by mouth every evening.       . gabapentin (NEURONTIN) 800 MG tablet Take 1 tablet (800 mg total) by mouth 3 (three) times daily.  90 tablet  11  . Melatonin 5 MG TABS Take 5 mg by mouth at bedtime as needed (sleep).       [name] Glycol-Propyl Glycol (SYSTANE) 0.4-0.3 % SOLN Place 1 drop into both eyes as needed (dry eye).      . Tamsulosin HCl (FLOMAX) 0.4 MG CAPS Take 0.4 mg by mouth every evening.       . valsartan-hydrochlorothiazide (DIOVAN-HCT) 320-12.5 MG per  tablet Take 1 tablet by mouth every morning.  90 tablet  1   No current facility-administered medications on file prior to visit.    No Known Allergies  Past Medical History  Diagnosis Date  . MVP (mitral valve prolapse)     s/p Mitral Valve Repair  . CAD (coronary artery disease)     s/p CABG x 1  . HTN (hypertension)   . Hyperlipidemia   . DJD (degenerative joint disease)   . BPH (benign prostatic hyperplasia)   . Arthritis   . Lumbar disc disease   . Pancreatitis   . Gallstones   . Wandering (atrial) pacemaker 11/22/2013    with frequent multifocal ectopy (PACs, PVCs, junctional escape beats    Past Surgical History  Procedure Laterality Date  . Mitral valve repair  January 2002  . Coronary artery bypass graft  January 2002    Single bypass to the distal RCA  . Colonoscopy  ~2008    no polyps per pt.  Dr. [name]  . Cholecystectomy      Dr. Lennie Hummer  . Upper gastrointestinal endoscopy  02/22/13  .  Ercp N/A 02/22/2013    Procedure: ENDOSCOPIC RETROGRADE CHOLANGIOPANCREATOGRAPHY (ERCP);  Surgeon: [name], MD;  Location: [facility];  Service: Endoscopy;  Laterality: N/A;  . Spyglass lithotripsy N/A 02/22/2013    Procedure: [id number] LITHOTRIPSY;  Surgeon: [name], MD;  Location: [facility];  Service: Endoscopy;  Laterality: N/A;  litho ord'd 7/8 by DL/PO [id number] [facility]  . Spyglass cholangioscopy N/A 02/22/2013    Procedure: ZXYOFVWA CHOLANGIOSCOPY;  Surgeon: [name], MD;  Location: [facility];  Service: Endoscopy;  Laterality: N/A;  . Hernia repair  05/05/11    Lap L inguinal & obturator herniae, R Femoral Hernia    History  Smoking status  . Never Smoker   Smokeless tobacco  . Never Used    History  Alcohol Use  . 0.0 oz/week  . 1-2 Glasses of wine per week    Comment: rare alcohol use    Family History  Problem Relation Age of Onset  . Stroke Father   . Stroke Mother     TIA    Review of Systems: As noted in history of  present illness. On doxycycline now for paronychia. All other systems were reviewed and are negative.  Physical Exam: BP 126/86  Pulse 58  Ht 6' 2"  (1.88 m)  Wt 196 lb 14.4 oz (89.313 kg)  BMI 25.27 kg/m2 He is a pleasant white male in no acute distress. His HEENT exam is unremarkable. He has no JVD or bruits. Lungs are clear. Cardiac exam reveals a regular rate and rhythm without gallop or click. There is a soft systolic murmur at the left lower sternal border. Abdomen is soft and nontender without masses or bruits. He has trace ankle and feet edema.   Pulse is good.  LABORATORY DATA: Lab Results  Component Value Date   WBC 5.5 04/03/2014   HGB 13.4 04/03/2014   HCT 39.1 04/03/2014   PLT 208 04/03/2014   GLUCOSE 96 04/03/2014   CHOL 117 10/07/2012   TRIG 63 10/07/2012   HDL 38* 10/07/2012   LDLCALC 66 10/07/2012   ALT 15 10/21/2013   AST 19 10/21/2013   NA 141 04/03/2014   K 3.9 04/03/2014   CL 103 04/03/2014   CREATININE 1.07 04/03/2014   BUN 19 04/03/2014   CO2 31 04/03/2014   TSH 2.27 07/14/2013     Assessment / Plan: 1. Mitral insufficiency status post mitral valve repair. Excellent long-term result.  2. Coronary disease status post single-vessel coronary bypass surgery with a vein graft to the distal RCA. He is asymptomatic.  3. Hypertension, blood pressure control is good today. He will continue to monitor.  4. Chronic PACs and PVCs. These are asymptomatic now. Wandering atrial pacemaker by Ecg (not Afib).  5. Swelling. This appears more related to arthritis. Will treat conservatively with elevation and support hose.

## 2014-06-13 ENCOUNTER — Other Ambulatory Visit: Payer: Self-pay

## 2014-06-13 ENCOUNTER — Telehealth: Payer: Self-pay | Admitting: Internal Medicine

## 2014-06-13 MED ORDER — ROSUVASTATIN CALCIUM 20 MG PO TABS: ORAL_TABLET | ORAL | Status: DC

## 2014-06-13 NOTE — Telephone Encounter (Signed)
Refill request for crestor 109m #30 to [facility] [address]

## 2014-06-13 NOTE — Telephone Encounter (Signed)
done

## 2014-07-13 ENCOUNTER — Other Ambulatory Visit: Payer: Self-pay | Admitting: Family Medicine

## 2014-07-25 ENCOUNTER — Telehealth: Payer: Self-pay | Admitting: Internal Medicine

## 2014-07-25 NOTE — Telephone Encounter (Signed)
Faxed over medical records to [facility]  @ [phone]

## 2014-08-01 ENCOUNTER — Ambulatory Visit: Payer: Medicare Other | Admitting: Internal Medicine

## 2014-08-12 ENCOUNTER — Other Ambulatory Visit: Payer: Self-pay | Admitting: Family Medicine

## 2014-08-16 ENCOUNTER — Ambulatory Visit: Payer: Medicare Other | Admitting: Internal Medicine

## 2014-08-16 ENCOUNTER — Encounter: Payer: Self-pay | Admitting: Internal Medicine

## 2014-08-16 VITALS — BP 157/88 | HR 66 | Temp 98.1°F | Wt 190.0 lb

## 2014-08-16 DIAGNOSIS — M4646 Discitis, unspecified, lumbar region: Secondary | ICD-10-CM

## 2014-08-16 NOTE — Progress Notes (Signed)
Subjective:    Patient ID: [name] [name], male    DOB: February 12, 1930, 81 y.o.   MRN: [id number]  HPI 81yo M with hx of low back pain and hip pain due to arthritis, was treated for early discitis last Fall.  Since we last saw him, he has seen dr. Rolena Infante, seen by [facility] for pain management. Getting worked up for peripheral neuropathy. Low back pain is not worse, still somewhat limiting his day to day activity. He also  had ct today by urology for blood in urine. He comes in today to see what are options for pain, or follow up for discitis, if still present. He does not necessarily feel any worse than when he finished course of treatment for discitis  Current Outpatient Prescriptions on File Prior to Visit  Medication Sig Dispense Refill  . acetaminophen (TYLENOL) 500 MG tablet Take 1,000 mg by mouth every 6 (six) hours as needed for mild pain.    Marland Kitchen amLODipine (NORVASC) 5 MG tablet Take 1 tablet (5 mg total) by mouth every evening. 30 tablet 6  . aspirin 325 MG EC tablet Take 325 mg by mouth every evening.     . beclomethasone (BECONASE AQ) 42 MCG/SPRAY nasal spray Place 2 sprays into both nostrils 1 day or 1 dose. Dose is for each nostril. 25 g 12  . Cholecalciferol (VITAMIN D) 1000 UNITS capsule Take 1,000 Units by mouth daily.      . CRESTOR 20 MG tablet take 1 tablet by mouth once daily 30 tablet 0  . dutasteride (AVODART) 0.5 MG capsule Take 0.5 mg by mouth every evening.     . gabapentin (NEURONTIN) 800 MG tablet Take 1 tablet (800 mg total) by mouth 3 (three) times daily. 90 tablet 11  . Melatonin 5 MG TABS Take 5 mg by mouth at bedtime as needed (sleep).     [name] Glycol-Propyl Glycol (SYSTANE) 0.4-0.3 % SOLN Place 1 drop into both eyes as needed (dry eye).    . Tamsulosin HCl (FLOMAX) 0.4 MG CAPS Take 0.4 mg by mouth every evening.     . valsartan-hydrochlorothiazide (DIOVAN-HCT) 320-12.5 MG per tablet Take 1 tablet by mouth every morning. 90 tablet 1   No current  facility-administered medications on file prior to visit.   Active Ambulatory Problems    Diagnosis Date Noted  . CAD (coronary artery disease)   . HTN (hypertension)   . Hyperlipidemia   . DOE (dyspnea on exertion) 11/06/2010  . PVC's (premature ventricular contractions) 11/06/2010  . S/P mitral valve repair 11/06/2010  . Fatigue 11/06/2010  . Weight loss 11/06/2010  . Cough 11/06/2010  . Left inguinal hernia 04/10/2011  . Left obturator hernia 05/21/2011  . Right femoral hernia 05/21/2011  . Arthritis 09/30/2011  . Hyperlipidemia LDL goal < 70 09/30/2011  . Lightheadedness 11/25/2011  . PAC (premature atrial contraction) 11/17/2012  . Inguinal pain, lower right quadrant 01/31/2013  . Calculus of bile duct without mention of cholecystitis or obstruction 02/22/2013  . Urinary retention 02/24/2013  . Change in bowel habits 03/24/2013  . Wandering (atrial) pacemaker 11/22/2013   Resolved Ambulatory Problems    Diagnosis Date Noted  . Abdominal pain, epigastric 01/31/2013   Past Medical History  Diagnosis Date  . MVP (mitral valve prolapse)   . DJD (degenerative joint disease)   . BPH (benign prostatic hyperplasia)   . Lumbar disc disease   . Pancreatitis   . Gallstones    History  Substance Use Topics  .  Smoking status: Never Smoker   . Smokeless tobacco: Never Used  . Alcohol Use: 0.0 oz/week    1-2 Glasses of wine per week     Comment: rare alcohol use     Review of Systems Aside from back pain, hip pain and some hematurina (now improved), he is otherwise reporting negative on other 10 point ros.    Objective:   Physical Exam BP 157/88 mmHg  Pulse 66  Temp(Src) 98.1 F (36.7 C) (Oral)  Wt 190 lb (86.183 kg) No exam      Assessment & Plan:   25 min spent in face to face time discussion further management option for discitis should we feel progression of it is occurring. By reported symptoms, it does not appear that back pain is worse. Agree with continue  with seeing pain management is key to improving overall quality of life. He is also Getting mri on spine on 19th for evaluation before and after treatment. He will Follow up to see if need to come back into care if scan is worse.

## 2014-08-29 ENCOUNTER — Encounter: Payer: Self-pay | Admitting: Family Medicine

## 2014-09-11 ENCOUNTER — Other Ambulatory Visit: Payer: Self-pay | Admitting: Cardiology

## 2014-09-11 ENCOUNTER — Other Ambulatory Visit: Payer: Self-pay | Admitting: Family Medicine

## 2014-09-15 ENCOUNTER — Other Ambulatory Visit: Payer: Self-pay | Admitting: Cardiology

## 2014-10-09 ENCOUNTER — Telehealth: Payer: Self-pay | Admitting: *Deleted

## 2014-10-09 NOTE — Telephone Encounter (Signed)
Called [facility] to try and get MRI results from Jan 19 and they need a signed release from to patient prior to faxing. Called the patient and had to leave a message for the patient to call the office so that I can have him come and sign a release to get the note for Dr Storm Frisk review to make decision on follow up visit.

## 2014-10-11 ENCOUNTER — Other Ambulatory Visit: Payer: Self-pay | Admitting: Family Medicine

## 2014-10-13 ENCOUNTER — Encounter: Payer: Self-pay | Admitting: Family Medicine

## 2014-10-13 ENCOUNTER — Ambulatory Visit (INDEPENDENT_AMBULATORY_CARE_PROVIDER_SITE_OTHER): Payer: Medicare Other | Admitting: Family Medicine

## 2014-10-13 ENCOUNTER — Telehealth: Payer: Self-pay | Admitting: Gastroenterology

## 2014-10-13 VITALS — BP 124/72 | HR 66 | Wt 186.0 lb

## 2014-10-13 DIAGNOSIS — M4806 Spinal stenosis, lumbar region: Secondary | ICD-10-CM | POA: Diagnosis not present

## 2014-10-13 DIAGNOSIS — M25552 Pain in left hip: Secondary | ICD-10-CM | POA: Diagnosis not present

## 2014-10-13 DIAGNOSIS — M1990 Unspecified osteoarthritis, unspecified site: Secondary | ICD-10-CM | POA: Diagnosis not present

## 2014-10-13 DIAGNOSIS — I10 Essential (primary) hypertension: Secondary | ICD-10-CM | POA: Diagnosis not present

## 2014-10-13 DIAGNOSIS — G629 Polyneuropathy, unspecified: Secondary | ICD-10-CM | POA: Diagnosis not present

## 2014-10-13 DIAGNOSIS — M48061 Spinal stenosis, lumbar region without neurogenic claudication: Secondary | ICD-10-CM | POA: Insufficient documentation

## 2014-10-13 DIAGNOSIS — E785 Hyperlipidemia, unspecified: Secondary | ICD-10-CM | POA: Diagnosis not present

## 2014-10-13 DIAGNOSIS — M25551 Pain in right hip: Secondary | ICD-10-CM

## 2014-10-13 LAB — LIPID PANEL
Cholesterol: 104 mg/dL (ref 0–200)
HDL: 43 mg/dL (ref >=40)
LDL CALC: 44 mg/dL (ref 0–99)
TRIGLYCERIDES: 85 mg/dL (ref <150)
Total CHOL/HDL Ratio: 2.4 Ratio
VLDL: 17 mg/dL (ref 0–40)

## 2014-10-13 LAB — COMPREHENSIVE METABOLIC PANEL
ALK PHOS: 125 U/L — AB (ref 39–117)
ALT: 31 U/L (ref 0–53)
AST: 31 U/L (ref 0–37)
Albumin: 3.8 g/dL (ref 3.5–5.2)
BILIRUBIN TOTAL: 1.3 mg/dL — AB (ref 0.2–1.2)
BUN: 18 mg/dL (ref 6–23)
CO2: 28 mEq/L (ref 19–32)
CREATININE: 1.04 mg/dL (ref 0.50–1.35)
Calcium: 8.7 mg/dL (ref 8.4–10.5)
Chloride: 99 mEq/L (ref 96–112)
GLUCOSE: 89 mg/dL (ref 70–99)
Potassium: 3.4 mEq/L — ABNORMAL LOW (ref 3.5–5.3)
Sodium: 140 mEq/L (ref 135–145)
Total Protein: 6.6 g/dL (ref 6.0–8.3)

## 2014-10-13 LAB — CBC WITH DIFFERENTIAL/PLATELET
Basophils Absolute: 0.1 10*3/uL (ref 0.0–0.1)
Basophils Relative: 1 % (ref 0–1)
Eosinophils Absolute: 0.1 10*3/uL (ref 0.0–0.7)
Eosinophils Relative: 2 % (ref 0–5)
HCT: 42.3 % (ref 39.0–52.0)
HEMOGLOBIN: 14.2 g/dL (ref 13.0–17.0)
LYMPHS ABS: 0.9 10*3/uL (ref 0.7–4.0)
LYMPHS PCT: 14 % (ref 12–46)
MCH: 29.8 pg (ref 26.0–34.0)
MCHC: 33.6 g/dL (ref 30.0–36.0)
MCV: 88.9 fL (ref 78.0–100.0)
MONO ABS: 0.9 10*3/uL (ref 0.1–1.0)
MPV: 10.2 fL (ref 8.6–12.4)
Monocytes Relative: 15 % — ABNORMAL HIGH (ref 3–12)
Neutro Abs: 4.1 10*3/uL (ref 1.7–7.7)
Neutrophils Relative %: 68 % (ref 43–77)
PLATELETS: 176 10*3/uL (ref 150–400)
RBC: 4.76 MIL/uL (ref 4.22–5.81)
RDW: 14.2 % (ref 11.5–15.5)
WBC: 6.1 10*3/uL (ref 4.0–10.5)

## 2014-10-13 NOTE — Telephone Encounter (Signed)
A few days ago, the patient had a set of symptoms similar to when he had the retained bile duct stone. He felt it significant enough to contact you. He report a sudden pain then vomiting. Once he had vomited, the pain began to subside. He has a generalized abdominal discomfort now. Cannot pinpoint a tender area. No further vomiting. Please advise.

## 2014-10-13 NOTE — Progress Notes (Signed)
   Subjective:    Patient ID: [name] [name], male    DOB: 01-09-1930, 81 y.o.   MRN: [id number]  HPI He is here for medication check. Presently he is on Crestor for his hyperlipidemia and needs follow-up blood work. He has had a great deal of difficulty dealing with back and hip pain. He does have a history of spinal stenosis and recently had EMG studies which did show peripheral neuropathy. He also has a history of discitis and has seen infectious disease for this. He is seen by Dr. Hardin Negus in the pain clinic as well as Dr. Nelva Bush. He recently saw Dr. Amil Amen who apparently injected some the joints in his hands to help with swelling and mobility. Presently he is getting gabapentin through Dr. Hardin Negus. His main complaint today is hip pain mainly on the right hand side. Also of note is the fact that he and his wife will soon be moving to [place]. He continues on medications listed in the chart and on multivitamins.   Review of Systems     Objective:   Physical Exam Alert and in no distress. He points to the right gluteus medius as to the area of most pain. No tenderness over SI joint or greater trochanter.       Assessment & Plan:  Hyperlipidemia - Plan: Lipid panel  Essential hypertension - Plan: CBC with Differential/Platelet, Comprehensive metabolic panel  Arthritis - Plan: CBC with Differential/Platelet, Comprehensive metabolic panel  Peripheral neuropathy  Hip pain, bilateral  Spinal stenosis of lumbar region  he will follow-up with Dr. Hardin Negus concerning possibly changing his Neurontin. If he continues to have pain, I recommend following up with Dr. Rolena Infante and Hardin Negus concerning this. Encouraged him to continue with multivitamins. He can add 2 Tylenol 4 times per day as needed for the pain.

## 2014-10-16 MED ORDER — ROSUVASTATIN CALCIUM 20 MG PO TABS: 20.0000 mg | ORAL_TABLET | Freq: Every day | ORAL | Status: DC

## 2014-10-16 NOTE — Addendum Note (Signed)
Addended by: Minette Headland A on: 10/16/2014 12:20 PM   Modules accepted: Orders

## 2014-10-17 NOTE — Telephone Encounter (Signed)
Try hyomax for pain.  If pain does not subside or worsens he should call back immediately.

## 2014-10-18 ENCOUNTER — Telehealth: Payer: Self-pay | Admitting: *Deleted

## 2014-10-18 ENCOUNTER — Other Ambulatory Visit: Payer: Self-pay

## 2014-10-18 MED ORDER — HYOSCYAMINE SULFATE ER 0.375 MG PO TBCR: 1.0000 | EXTENDED_RELEASE_TABLET | Freq: Two times a day (BID) | ORAL | Status: DC | PRN

## 2014-10-18 NOTE — Telephone Encounter (Signed)
Changed rx to robinul forte 78m. This prescription is covered under patients insurance The hyomax was going to cost pt 190.00   The robinul is only $30.00

## 2014-10-18 NOTE — Telephone Encounter (Signed)
Patient is pain free. Instructed on plan if pain returns. Agrees to this and expresses understanding. He would like to try the Hyomax for the abdominal bloating and discomfort. Uses [facility].

## 2014-10-19 NOTE — Telephone Encounter (Signed)
Okay 

## 2014-11-10 ENCOUNTER — Other Ambulatory Visit: Payer: Self-pay

## 2014-11-10 MED ORDER — VALSARTAN-HYDROCHLOROTHIAZIDE 320-12.5 MG PO TABS: 1.0000 | ORAL_TABLET | Freq: Every morning | ORAL | Status: DC

## 2014-11-13 ENCOUNTER — Other Ambulatory Visit: Payer: Self-pay | Admitting: Cardiology

## 2014-11-21 ENCOUNTER — Ambulatory Visit (INDEPENDENT_AMBULATORY_CARE_PROVIDER_SITE_OTHER): Payer: 59 | Admitting: Cardiology

## 2014-11-21 ENCOUNTER — Encounter: Payer: Self-pay | Admitting: Cardiology

## 2014-11-21 VITALS — BP 142/70 | HR 63 | Ht 74.0 in | Wt 187.5 lb

## 2014-11-21 DIAGNOSIS — I10 Essential (primary) hypertension: Secondary | ICD-10-CM

## 2014-11-21 DIAGNOSIS — I25810 Atherosclerosis of coronary artery bypass graft(s) without angina pectoris: Secondary | ICD-10-CM | POA: Diagnosis not present

## 2014-11-21 DIAGNOSIS — Z9889 Other specified postprocedural states: Secondary | ICD-10-CM

## 2014-11-21 DIAGNOSIS — I491 Atrial premature depolarization: Secondary | ICD-10-CM | POA: Diagnosis not present

## 2014-11-21 NOTE — Patient Instructions (Signed)
Continue your current therapy  I will see you in 6 months.   

## 2014-11-21 NOTE — Progress Notes (Signed)
[name] [name] [name] Date of Birth: 04/19/1930   History of Present Illness: [name] [name] [name] is seen today for followup s/p MVR. He is status post mitral valve repair with single vessel coronary bypass surgery (due to refractory coronary spasm)  in 2002. Follow up Echo in April 2012 showed a good MV repair. EF was 55%. He also has a history of HTN and PVC/PACs. He is still recovering from spinal infection last summer.  He is doing some water exercises. Denies chest pain or SOB. He has been diagnosed with peripheral neuropathy and is on vitamins and gabapentin. He has minor swelling in his ankles. Planning to move into a new apartment at [place] this summer. He denies any dyspnea, Chest pain, or palpitations.  Current Outpatient Prescriptions on File Prior to Visit  Medication Sig Dispense Refill  . acetaminophen (TYLENOL) 500 MG tablet Take 1,000 mg by mouth every 6 (six) hours as needed for mild pain.    Marland Kitchen amLODipine (NORVASC) 5 MG tablet take 1 tablet by mouth EVERY EVENING 30 tablet 0  . aspirin 325 MG EC tablet Take 325 mg by mouth every evening.     . beclomethasone (BECONASE AQ) 42 MCG/SPRAY nasal spray Place 2 sprays into both nostrils 1 day or 1 dose. Dose is for each nostril. (Patient taking differently: Place 2 sprays into both nostrils 1 day or 1 dose. Dose is for each nostril.(seasonal) April in to June) 25 g 12  . Cholecalciferol (VITAMIN D) 1000 UNITS capsule Take 1,000 Units by mouth daily.      Marland Kitchen dutasteride (AVODART) 0.5 MG capsule Take 0.5 mg by mouth every evening.     . gabapentin (NEURONTIN) 300 MG capsule Take 300 mg by mouth 4 (four) times daily.    Marland Kitchen Hyoscyamine Sulfate 0.375 MG TBCR Take 1 tablet (0.375 mg total) by mouth 2 (two) times daily as needed. 60 tablet 1  . Polyethyl Glycol-Propyl Glycol (SYSTANE) 0.4-0.3 % SOLN Place 1 drop into both eyes as needed (dry eye).    . rosuvastatin (CRESTOR) 20 MG tablet Take 1 tablet (20 mg total) by mouth daily. 30 tablet 5  . Tamsulosin  HCl (FLOMAX) 0.4 MG CAPS Take 0.4 mg by mouth every evening.     . valsartan-hydrochlorothiazide (DIOVAN-HCT) 320-12.5 MG per tablet Take 1 tablet by mouth every morning. 90 tablet 0   No current facility-administered medications on file prior to visit.    No Known Allergies  Past Medical History  Diagnosis Date  . MVP (mitral valve prolapse)     s/p Mitral Valve Repair  . CAD (coronary artery disease)     s/p CABG x 1  . HTN (hypertension)   . Hyperlipidemia   . DJD (degenerative joint disease)   . BPH (benign prostatic hyperplasia)   . Arthritis   . Lumbar disc disease   . Pancreatitis   . Gallstones   . Wandering (atrial) pacemaker 11/22/2013    with frequent multifocal ectopy (PACs, PVCs, junctional escape beats    Past Surgical History  Procedure Laterality Date  . Mitral valve repair  January 2002  . Coronary artery bypass graft  January 2002    Single bypass to the distal RCA  . Colonoscopy  ~2008    no polyps per pt.  Dr. [name]  . Cholecystectomy      Dr. Lennie Hummer  . Upper gastrointestinal endoscopy  02/22/13  . Ercp N/A 02/22/2013    Procedure: ENDOSCOPIC RETROGRADE CHOLANGIOPANCREATOGRAPHY (ERCP);  Surgeon: [name], MD;  Location: [facility];  Service: Endoscopy;  Laterality: N/A;  . Spyglass lithotripsy N/A 02/22/2013    Procedure: [id number] LITHOTRIPSY;  Surgeon: [name], MD;  Location: [facility];  Service: Endoscopy;  Laterality: N/A;  litho ord'd 7/8 by DL/PO [id number] [facility]  . Spyglass cholangioscopy N/A 02/22/2013    Procedure: [id number] CHOLANGIOSCOPY;  Surgeon: [name], MD;  Location: [facility];  Service: Endoscopy;  Laterality: N/A;  . Hernia repair  05/05/11    Lap L inguinal & obturator herniae, R Femoral Hernia    History  Smoking status  . Never Smoker   Smokeless tobacco  . Never Used    History  Alcohol Use  . 0.0 oz/week  . 1-2 Glasses of wine per week    Comment: rare alcohol use    Family History   Problem Relation Age of Onset  . Stroke Father   . Stroke Mother     TIA    Review of Systems: As noted in history of present illness. All other systems were reviewed and are negative.  Physical Exam: BP 142/70 mmHg  Pulse 63  Ht 6' 2"  (1.88 m)  Wt 187 lb 8 oz (85.049 kg)  BMI 24.06 kg/m2 He is a pleasant white male in no acute distress. His HEENT exam is unremarkable. He has no JVD or bruits. Lungs are clear. Cardiac exam reveals a regular rate and rhythm without gallop or click. There is a soft systolic murmur at the left lower sternal border. Abdomen is soft and nontender without masses or bruits. He has trace  feet edema.    LABORATORY DATA: Lab Results  Component Value Date   WBC 6.1 10/13/2014   HGB 14.2 10/13/2014   HCT 42.3 10/13/2014   PLT 176 10/13/2014   GLUCOSE 89 10/13/2014   CHOL 104 10/13/2014   TRIG 85 10/13/2014   HDL 43 10/13/2014   LDLCALC 44 10/13/2014   ALT 31 10/13/2014   AST 31 10/13/2014   NA 140 10/13/2014   K 3.4* 10/13/2014   CL 99 10/13/2014   CREATININE 1.04 10/13/2014   BUN 18 10/13/2014   CO2 28 10/13/2014   TSH 2.27 07/14/2013   Ecg today demonstrates NSR with PAC. Otherwise normal.  Assessment / Plan: 1. Mitral insufficiency status post mitral valve repair. Excellent long-term result.  2. Coronary disease status post single-vessel coronary bypass surgery with a vein graft to the distal RCA due to refractory spasm. He is asymptomatic.  3. Hypertension, [name] [name] pressure control is good today. Continue Rx.  4. Chronic PACs and PVCs. These are asymptomatic now. Wandering atrial pacemaker by prior Ecg (not Afib).  5. Swelling. Minimal at this time.

## 2014-12-10 ENCOUNTER — Other Ambulatory Visit (HOSPITAL_COMMUNITY): Payer: Self-pay | Admitting: Cardiovascular Disease

## 2014-12-12 ENCOUNTER — Telehealth: Payer: Self-pay | Admitting: Gastroenterology

## 2014-12-12 NOTE — Telephone Encounter (Signed)
He has an indigestion feeling that started 2 days ago. He says nothing makes it worse, it is constant and he has a lot of burping. He says he has a burning into his esophagus. He is unwilling to go to the ER at this point because he feels certain it is not his heart. He will try TUMS. He has Pepcid that he has not tried, but will take it tonight. I told him if the TUMS doesn't give him relief or if his pain changes, worsens or he feel uncertain he has to call 911. Again he declines to go to the ER now and have it checked out.

## 2014-12-13 NOTE — Telephone Encounter (Signed)
He can try over-the-counter omeprazole one tab before breakfast.  Call back if not improved

## 2014-12-13 NOTE — Telephone Encounter (Signed)
Spoke with the patient. He will see if he has this in the home. He does want to keep the appointment tomorrow.

## 2014-12-14 ENCOUNTER — Ambulatory Visit (INDEPENDENT_AMBULATORY_CARE_PROVIDER_SITE_OTHER): Payer: 59 | Admitting: Nurse Practitioner

## 2014-12-14 ENCOUNTER — Encounter: Payer: Self-pay | Admitting: Nurse Practitioner

## 2014-12-14 VITALS — BP 142/76 | HR 60 | Ht 73.0 in | Wt 188.4 lb

## 2014-12-14 DIAGNOSIS — R1010 Upper abdominal pain, unspecified: Secondary | ICD-10-CM | POA: Diagnosis not present

## 2014-12-14 DIAGNOSIS — R140 Abdominal distension (gaseous): Secondary | ICD-10-CM | POA: Diagnosis not present

## 2014-12-14 DIAGNOSIS — R195 Other fecal abnormalities: Secondary | ICD-10-CM | POA: Diagnosis not present

## 2014-12-14 MED ORDER — COLESTIPOL HCL 5 G PO PACK: 5.0000 g | PACK | Freq: Two times a day (BID) | ORAL | Status: DC

## 2014-12-14 NOTE — Progress Notes (Signed)
     History of Present Illness:  Patient is an 81 year old male known to Dr. Deatra Ina. He was last seen in the office December 2014 for evaluation of bloating, gas, bowel changes and abdominal discomfort. Patient called in early March of this year with upper abdominal discomfort and bloating. He was given a bowel anti-spasmodic and symptoms resolved. Patient comes in today for recent episode of epigastric pain / belching and bloating. Pain started last Sunday, no associated vomiting. Pain was constant, radiating into chest and through to back. Pain continued into Monday. He called the office and was advised to try Tums or Pepcid but to go to ED if pain didn't resolve.  Patient then took Pepcid and Tums and pain abated. He has been fine for last couple of days.  Patient is s/p cholecystectomy. He underwent ERCP July 2014 with sphincterotomy and removal of several CBD stones.  Current Medications, Allergies, Past Medical History, Past Surgical History, Family History and Social History were reviewed in [employer] record.  Physical Exam: General: Pleasant, well developed , white male in no acute distress Head: Normocephalic and atraumatic Eyes:  sclerae anicteric, conjunctiva pink  Ears: Normal auditory acuity Lungs: Clear throughout to auscultation Heart: Regular rate and rhythm Abdomen: Soft, non distended, non-tender. No masses, no hepatomegaly. Normal bowel sounds Musculoskeletal: Symmetrical with no gross deformities  Extremities: No edema  Neurological: Alert oriented x 4, grossly nonfocal Psychological:  Alert and cooperative. Normal mood and affect  Assessment and Recommendations:  1.pleasant 81 year old male with chronic intermittent bloating / upper abdominal discomfort. Most recent episode totally resolved with Tums and Pepcid. His symptoms seem dyspeptic in nature and have totally resolved now. If patient has another episode I think labs and RUQ ultrasound  ultrasound should be done as patient does have a history of cholelithiasis ,though he had a sphincterotomy July 2014. I will call and check on him early next week. FODMAP diet given. He can take Pepcid prn  2. Chronic loose stool since cholecystectomy. He wants to try Cholestyramine again. Patient cannot remember if it helped in the past. Will call Rx to pharmacy.

## 2014-12-14 NOTE — Patient Instructions (Addendum)
Follow your FOD MAP DIET  Contact [name] [name] if you need any refills on cholestyramine  Continue Pepcid AC as needed

## 2014-12-18 ENCOUNTER — Telehealth: Payer: Self-pay | Admitting: Nurse Practitioner

## 2014-12-18 NOTE — Telephone Encounter (Signed)
Called to check on patient. No further abdominal pain. If patient has recurrent upper abdominal pain then he knows to call our office to get LFTs, amylase, lipase and RUQ ultrasound.   Tye Savoy, NP

## 2014-12-18 NOTE — Progress Notes (Signed)
Reviewed and agree with management. Davida Falconi D. Gaynelle Pastrana, M.D., FACG  

## 2015-01-11 ENCOUNTER — Other Ambulatory Visit: Payer: Self-pay | Admitting: *Deleted

## 2015-01-11 MED ORDER — AMLODIPINE BESYLATE 5 MG PO TABS: 5.0000 mg | ORAL_TABLET | Freq: Every evening | ORAL | Status: DC

## 2015-01-23 DIAGNOSIS — M5136 Other intervertebral disc degeneration, lumbar region: Secondary | ICD-10-CM | POA: Diagnosis not present

## 2015-01-25 ENCOUNTER — Telehealth: Payer: Self-pay | Admitting: Cardiology

## 2015-01-25 ENCOUNTER — Encounter: Payer: Self-pay | Admitting: *Deleted

## 2015-01-25 NOTE — Telephone Encounter (Signed)
He may hold ASA for 5 days for procedure.  Oyuki Hogan [place] MD, [facility]

## 2015-01-25 NOTE — Telephone Encounter (Signed)
Outgoing call to [facility] to communicate instructions, letter sent via e-fax.

## 2015-01-25 NOTE — Telephone Encounter (Signed)
Ryan from [facility] calling to get clearance for patient to stop ASA 359m daily prior to a procedure sched. In July.  Will route to Dr. JMartiniquefor OPonderay  --------------------- Clearance/med instructions can be faxed to ATTN: Ryan @ [phone]

## 2015-01-30 DIAGNOSIS — M5136 Other intervertebral disc degeneration, lumbar region: Secondary | ICD-10-CM | POA: Diagnosis not present

## 2015-02-02 DIAGNOSIS — M4806 Spinal stenosis, lumbar region: Secondary | ICD-10-CM | POA: Diagnosis not present

## 2015-02-02 DIAGNOSIS — M4626 Osteomyelitis of vertebra, lumbar region: Secondary | ICD-10-CM | POA: Diagnosis not present

## 2015-02-02 DIAGNOSIS — M5136 Other intervertebral disc degeneration, lumbar region: Secondary | ICD-10-CM | POA: Diagnosis not present

## 2015-02-27 ENCOUNTER — Encounter: Payer: Self-pay | Admitting: Internal Medicine

## 2015-02-27 ENCOUNTER — Ambulatory Visit (INDEPENDENT_AMBULATORY_CARE_PROVIDER_SITE_OTHER): Payer: Medicare Other | Admitting: Internal Medicine

## 2015-02-27 VITALS — BP 118/75 | HR 67 | Temp 98.2°F | Wt 182.0 lb

## 2015-02-27 DIAGNOSIS — M4646 Discitis, unspecified, lumbar region: Secondary | ICD-10-CM | POA: Diagnosis not present

## 2015-02-27 LAB — BASIC METABOLIC PANEL
BUN: 20 mg/dL (ref 6–23)
CALCIUM: 9.1 mg/dL (ref 8.4–10.5)
CO2: 29 meq/L (ref 19–32)
Chloride: 102 mEq/L (ref 96–112)
Creat: 1.04 mg/dL (ref 0.50–1.35)
Glucose, Bld: 92 mg/dL (ref 70–99)
Potassium: 3.8 mEq/L (ref 3.5–5.3)
Sodium: 143 mEq/L (ref 135–145)

## 2015-02-27 LAB — CBC WITH DIFFERENTIAL/PLATELET
BASOS PCT: 1 % (ref 0–1)
Basophils Absolute: 0.1 10*3/uL (ref 0.0–0.1)
Eosinophils Absolute: 0.1 10*3/uL (ref 0.0–0.7)
Eosinophils Relative: 1 % (ref 0–5)
HEMATOCRIT: 40 % (ref 39.0–52.0)
HEMOGLOBIN: 13.3 g/dL (ref 13.0–17.0)
LYMPHS ABS: 1 10*3/uL (ref 0.7–4.0)
LYMPHS PCT: 16 % (ref 12–46)
MCH: 29.6 pg (ref 26.0–34.0)
MCHC: 33.3 g/dL (ref 30.0–36.0)
MCV: 88.9 fL (ref 78.0–100.0)
MONO ABS: 0.5 10*3/uL (ref 0.1–1.0)
MONOS PCT: 9 % (ref 3–12)
MPV: 10.4 fL (ref 8.6–12.4)
Neutro Abs: 4.5 10*3/uL (ref 1.7–7.7)
Neutrophils Relative %: 73 % (ref 43–77)
Platelets: 221 10*3/uL (ref 150–400)
RBC: 4.5 MIL/uL (ref 4.22–5.81)
RDW: 13.9 % (ref 11.5–15.5)
WBC: 6.1 10*3/uL (ref 4.0–10.5)

## 2015-02-27 LAB — C-REACTIVE PROTEIN: CRP: 0.7 mg/dL — ABNORMAL HIGH (ref <0.60)

## 2015-02-28 LAB — SEDIMENTATION RATE: SED RATE: 5 mm/h (ref 0–20)

## 2015-03-01 ENCOUNTER — Telehealth: Payer: Self-pay | Admitting: *Deleted

## 2015-03-01 LAB — PROTEIN ELECTROPHORESIS, SERUM
ALBUMIN ELP: 3.9 g/dL (ref 3.8–4.8)
Alpha-1-Globulin: 0.3 g/dL (ref 0.2–0.3)
Alpha-2-Globulin: 0.6 g/dL (ref 0.5–0.9)
BETA GLOBULIN: 0.4 g/dL (ref 0.4–0.6)
Beta 2: 0.4 g/dL (ref 0.2–0.5)
Gamma Globulin: 1.2 g/dL (ref 0.8–1.7)
Total Protein, Serum Electrophoresis: 6.8 g/dL (ref 6.1–8.1)

## 2015-03-01 NOTE — Telephone Encounter (Signed)
Called the patient and advised him of his appt set for 03/05/15 at 10 am at short stay for him spinal aspiration per Dr Baxter Flattery. The patient was advised to arrive at 10 am at the [facility] admitting. That he needs a driver and can have nothing to eat for 6 hours prior except his morning meds with a little water.

## 2015-03-02 ENCOUNTER — Other Ambulatory Visit: Payer: Self-pay | Admitting: Radiology

## 2015-03-05 ENCOUNTER — Encounter (HOSPITAL_COMMUNITY): Payer: Self-pay

## 2015-03-05 ENCOUNTER — Ambulatory Visit (HOSPITAL_COMMUNITY)
Admission: RE | Admit: 2015-03-05 | Discharge: 2015-03-05 | Disposition: A | Discharge status: Home / Self Care | Payer: Medicare Other | Source: Ambulatory Visit | Attending: Internal Medicine | Admitting: Internal Medicine

## 2015-03-05 ENCOUNTER — Ambulatory Visit (HOSPITAL_COMMUNITY): Admission: RE | Admit: 2015-03-05 | Payer: Medicare Other | Source: Ambulatory Visit

## 2015-03-05 DIAGNOSIS — I341 Nonrheumatic mitral (valve) prolapse: Secondary | ICD-10-CM | POA: Diagnosis not present

## 2015-03-05 DIAGNOSIS — Z950 Presence of cardiac pacemaker: Secondary | ICD-10-CM | POA: Insufficient documentation

## 2015-03-05 DIAGNOSIS — I2510 Atherosclerotic heart disease of native coronary artery without angina pectoris: Secondary | ICD-10-CM | POA: Diagnosis not present

## 2015-03-05 DIAGNOSIS — Z7982 Long term (current) use of aspirin: Secondary | ICD-10-CM | POA: Insufficient documentation

## 2015-03-05 DIAGNOSIS — I10 Essential (primary) hypertension: Secondary | ICD-10-CM | POA: Diagnosis not present

## 2015-03-05 DIAGNOSIS — N400 Enlarged prostate without lower urinary tract symptoms: Secondary | ICD-10-CM | POA: Insufficient documentation

## 2015-03-05 DIAGNOSIS — M4646 Discitis, unspecified, lumbar region: Secondary | ICD-10-CM | POA: Diagnosis not present

## 2015-03-05 DIAGNOSIS — E785 Hyperlipidemia, unspecified: Secondary | ICD-10-CM | POA: Insufficient documentation

## 2015-03-05 DIAGNOSIS — Z951 Presence of aortocoronary bypass graft: Secondary | ICD-10-CM | POA: Diagnosis not present

## 2015-03-05 DIAGNOSIS — Z539 Procedure and treatment not carried out, unspecified reason: Secondary | ICD-10-CM | POA: Diagnosis not present

## 2015-03-05 DIAGNOSIS — M1990 Unspecified osteoarthritis, unspecified site: Secondary | ICD-10-CM | POA: Diagnosis not present

## 2015-03-05 LAB — CBC
HEMATOCRIT: 42.3 % (ref 39.0–52.0)
Hemoglobin: 13.8 g/dL (ref 13.0–17.0)
MCH: 30 pg (ref 26.0–34.0)
MCHC: 32.6 g/dL (ref 30.0–36.0)
MCV: 92 fL (ref 78.0–100.0)
Platelets: 166 10*3/uL (ref 150–400)
RBC: 4.6 MIL/uL (ref 4.22–5.81)
RDW: 13.5 % (ref 11.5–15.5)
WBC: 5.6 10*3/uL (ref 4.0–10.5)

## 2015-03-05 LAB — APTT: APTT: 36 s (ref 24–37)

## 2015-03-05 LAB — PROTIME-INR
INR: 1.15 (ref 0.00–1.49)
Prothrombin Time: 14.9 seconds (ref 11.6–15.2)

## 2015-03-05 MED ORDER — TOBRAMYCIN SULFATE 1.2 G IJ SOLR
INTRAMUSCULAR | Status: AC
  Filled 2015-03-05: qty 1.2

## 2015-03-05 MED ORDER — SODIUM CHLORIDE 0.9 % IV SOLN: Freq: Once | INTRAVENOUS | Status: DC

## 2015-03-05 MED ORDER — BUPIVACAINE HCL (PF) 0.25 % IJ SOLN
INTRAMUSCULAR | Status: AC
  Filled 2015-03-05: qty 30

## 2015-03-05 MED ORDER — FENTANYL CITRATE (PF) 100 MCG/2ML IJ SOLN
INTRAMUSCULAR | Status: AC
  Filled 2015-03-05: qty 2

## 2015-03-05 MED ORDER — MIDAZOLAM HCL 2 MG/2ML IJ SOLN
INTRAMUSCULAR | Status: AC
  Filled 2015-03-05: qty 2

## 2015-03-05 NOTE — H&P (Signed)
Chief Complaint: "I'm here for a disc aspiration" Referring Physician:Dr. Baxter Flattery HPI: [name] [name] is an 81 y.o. male with prior history of lumbar discitis. Underwent disc aspiration followed by several weeks of IV abx last year. Has had regular MRI follow up and most recent MRi shows inflammatory changes of the L1-L2 level with some psoas involvement. He is referred for repeat image guided aspiration. PMHx, meds, labs reviewed.   Past Medical History:  Past Medical History  Diagnosis Date  . MVP (mitral valve prolapse)     s/p Mitral Valve Repair  . CAD (coronary artery disease)     s/p CABG x 1  . HTN (hypertension)   . Hyperlipidemia   . DJD (degenerative joint disease)   . BPH (benign prostatic hyperplasia)   . Arthritis   . Lumbar disc disease   . Pancreatitis   . Gallstones   . Wandering (atrial) pacemaker 11/22/2013    with frequent multifocal ectopy (PACs, PVCs, junctional escape beats    Past Surgical History:  Past Surgical History  Procedure Laterality Date  . Mitral valve repair  January 2002  . Coronary artery bypass graft  January 2002    Single bypass to the distal RCA  . Colonoscopy  ~2008    no polyps per pt.  Dr. [name]  . Cholecystectomy      Dr. Lennie Hummer  . Upper gastrointestinal endoscopy  02/22/13  . Ercp N/A 02/22/2013    Procedure: ENDOSCOPIC RETROGRADE CHOLANGIOPANCREATOGRAPHY (ERCP);  Surgeon: [name], MD;  Location: [facility];  Service: Endoscopy;  Laterality: N/A;  . Spyglass lithotripsy N/A 02/22/2013    Procedure: [id number] LITHOTRIPSY;  Surgeon: [name], MD;  Location: [facility];  Service: Endoscopy;  Laterality: N/A;  litho ord'd 7/8 by DL/PO [id number] [facility]  . Spyglass cholangioscopy N/A 02/22/2013    Procedure: [id number] CHOLANGIOSCOPY;  Surgeon: [name], MD;  Location: [facility];  Service: Endoscopy;  Laterality: N/A;  . Hernia repair  05/05/11    Lap L inguinal & obturator herniae, R Femoral Hernia     Family History:  Family History  Problem Relation Age of Onset  . Stroke Father   . Stroke Mother     TIA    Social History:  reports that he has never smoked. He has never used smokeless tobacco. He reports that he drinks alcohol. He reports that he does not use illicit drugs.  Allergies: No Known Allergies  Medications:   Medication List    ASK your doctor about these medications        acetaminophen 500 MG tablet  Commonly known as:  TYLENOL  Take 1,000 mg by mouth every 6 (six) hours as needed for mild pain.     amLODipine 5 MG tablet  Commonly known as:  NORVASC  Take 1 tablet (5 mg total) by mouth every evening.     aspirin 325 MG EC tablet  Take 325 mg by mouth every evening.     beclomethasone 42 MCG/SPRAY nasal spray  Commonly known as:  BECONASE AQ  Place 2 sprays into both nostrils 1 day or 1 dose. Dose is for each nostril.     calcium carbonate 500 MG chewable tablet  Commonly known as:  TUMS - dosed in mg elemental calcium  Chew 1 tablet by mouth as needed for indigestion or heartburn.     colestipol 5 G packet  Commonly known as:  COLESTID  Take 5 g by mouth  2 (two) times daily.     dutasteride 0.5 MG capsule  Commonly known as:  AVODART  Take 0.5 mg by mouth every evening.     famotidine 20 MG tablet  Commonly known as:  PEPCID  Take 20 mg by mouth daily as needed for heartburn or indigestion.     FLOMAX 0.4 MG Caps capsule  Generic drug:  tamsulosin  Take 0.4 mg by mouth every evening.     gabapentin 300 MG capsule  Commonly known as:  NEURONTIN  Take 300 mg by mouth 4 (four) times daily.     Hyoscyamine Sulfate 0.375 MG Tbcr  Take 1 tablet (0.375 mg total) by mouth 2 (two) times daily as needed.     rosuvastatin 20 MG tablet  Commonly known as:  CRESTOR  Take 1 tablet (20 mg total) by mouth daily.     SYSTANE 0.4-0.3 % Soln  Generic drug:  Polyethyl Glycol-Propyl Glycol  Place 1 drop into both eyes as needed (dry eye).      valsartan-hydrochlorothiazide 320-12.5 MG per tablet  Commonly known as:  DIOVAN-HCT  take 1 tablet by mouth every morning     Vitamin D 1000 UNITS capsule  Take 1,000 Units by mouth daily.        Please HPI for pertinent positives, otherwise complete 10 system ROS negative.  Physical Exam: BP 170/80 mmHg  Pulse 55  Temp(Src) 97.6 F (36.4 C) (Oral)  Resp 18  Ht 6' 2"  (1.88 m)  Wt 185 lb (83.915 kg)  BMI 23.74 kg/m2  SpO2 98% Body mass index is 23.74 kg/(m^2).   General Appearance:  Alert, cooperative, no distress, appears stated age  Head:  Normocephalic, without obvious abnormality, atraumatic  ENT: Unremarkable  Neck: Supple, symmetrical, trachea midline  Lungs:   Clear to auscultation bilaterally, no w/r/r, respirations unlabored without use of accessory muscles.  Chest Wall:  No tenderness or deformity  Heart:  Regular rate and rhythm, S1, S2 normal, no murmur, rub or gallop.  Abdomen:   Soft, non-tender, non distended.  Extremities: Extremities normal, atraumatic, no cyanosis or edema  Pulses: 2+ and symmetric  Neurologic: Normal affect, no gross deficits.  Labs: Results for orders placed or performed during the hospital encounter of 03/05/15 (from the past 48 hour(s))  APTT upon arrival     Status: None   Collection Time: 03/05/15 10:45 AM  Result Value Ref Range   aPTT 36 24 - 37 seconds  CBC upon arrival     Status: None   Collection Time: 03/05/15 10:45 AM  Result Value Ref Range   WBC 5.6 4.0 - 10.5 K/uL   RBC 4.60 4.22 - 5.81 MIL/uL   Hemoglobin 13.8 13.0 - 17.0 g/dL   HCT 42.3 39.0 - 52.0 %   MCV 92.0 78.0 - 100.0 fL   MCH 30.0 26.0 - 34.0 pg   MCHC 32.6 30.0 - 36.0 g/dL   RDW 13.5 11.5 - 15.5 %   Platelets 166 150 - 400 K/uL  Protime-INR upon arrival     Status: None   Collection Time: 03/05/15 10:45 AM  Result Value Ref Range   Prothrombin Time 14.9 11.6 - 15.2 seconds   INR 1.15 0.00 - 1.49    Imaging: No results  found.  Assessment/Plan Recurrent lumbar discitis Plan for image guided needle aspiration Procedure discussed, including risks, complications, use of sedation. Labs reviewed, ok Consent signed in chart  Ascencion Dike PA-C 03/05/2015, 11:26 AM

## 2015-03-06 NOTE — Progress Notes (Signed)
Subjective:    Patient ID: [name] [name], male    DOB: December 03, 1929, 81 y.o.   MRN: [id number]  HPI 81yo M with hx of spinal stenosis, who was treated for culture negative lumbar osteomyelitis last year. He was off of antibiotics early this year and observed off of therapy. He was followed by his orthopedic surgeon and had repeat mri that showed abn signal that was thought to be due to recurrence of osteomyelitis.  Patient denies fever, chills, no new back pain than his usual chronic low back pain  Reviewed mri results of recent imaging  No Known Allergies Current Outpatient Prescriptions on File Prior to Visit  Medication Sig Dispense Refill  . acetaminophen (TYLENOL) 500 MG tablet Take 1,000 mg by mouth every 6 (six) hours as needed for mild pain.    Marland Kitchen amLODipine (NORVASC) 5 MG tablet Take 1 tablet (5 mg total) by mouth every evening. 30 tablet 6  . aspirin 325 MG EC tablet Take 325 mg by mouth every evening.     . beclomethasone (BECONASE AQ) 42 MCG/SPRAY nasal spray Place 2 sprays into both nostrils 1 day or 1 dose. Dose is for each nostril. (Patient taking differently: Place 2 sprays into both nostrils daily as needed for allergies. Dose is for each nostril.(seasonal) April in to June) 25 g 12  . calcium carbonate (TUMS - DOSED IN MG ELEMENTAL CALCIUM) 500 MG chewable tablet Chew 1 tablet by mouth as needed for indigestion or heartburn.    . Cholecalciferol (VITAMIN D) 1000 UNITS capsule Take 1,000 Units by mouth daily.      . colestipol (COLESTID) 5 G packet Take 5 g by mouth 2 (two) times daily. (Patient taking differently: Take 5 g by mouth 2 (two) times daily as needed. ) 30 each 1  . dutasteride (AVODART) 0.5 MG capsule Take 0.5 mg by mouth every evening.     . gabapentin (NEURONTIN) 300 MG capsule Take 300 mg by mouth 4 (four) times daily.    Marland Kitchen Hyoscyamine Sulfate 0.375 MG TBCR Take 1 tablet (0.375 mg total) by mouth 2 (two) times daily as needed. 60 tablet 1  . Polyethyl  Glycol-Propyl Glycol (SYSTANE) 0.4-0.3 % SOLN Place 1 drop into both eyes as needed (dry eye).    . rosuvastatin (CRESTOR) 20 MG tablet Take 1 tablet (20 mg total) by mouth daily. 30 tablet 5  . Tamsulosin HCl (FLOMAX) 0.4 MG CAPS Take 0.4 mg by mouth every evening.     . valsartan-hydrochlorothiazide (DIOVAN-HCT) 320-12.5 MG per tablet take 1 tablet by mouth every morning 90 tablet 1   No current facility-administered medications on file prior to visit.    Review of Systems + back pain. 10 point ros is negative    Objective:   Physical Exam BP 118/75 mmHg  Pulse 67  Temp(Src) 98.2 F (36.8 C) (Oral)  Wt 182 lb (82.555 kg) Physical Exam  Constitutional: He is oriented to person, place, and time. He appears well-developed and well-nourished. No distress.  HENT:  Mouth/Throat: Oropharynx is clear and moist. No oropharyngeal exudate.  Cardiovascular: Normal rate, regular rhythm and normal heart sounds. Exam reveals no gallop and no friction rub.  No murmur heard.  Pulmonary/Chest: Effort normal and breath sounds normal. No respiratory distress. He has no wheezes.  Lymphadenopathy:  He has no cervical adenopathy.  Neurological: He is alert and oriented to person, place, and time.  Skin: Skin is warm and dry. No rash noted. No erythema.  Lab Results  Component Value Date   ESRSEDRATE 5 02/27/2015   Lab Results  Component Value Date   CRP 0.7* 02/27/2015   Mri imaging on 01/30/2015: Findings are worrisome for progressive osteomyelitis and discitis at L1-2 with increasing edema in the adjacent left psoas muscle, persisten edema wihtin the left lateral recess and left neural foramen and development on enhancement within the right lateral recess and neural foramen. Focus of low signal seen in teh left iliopsoas muscle is suspicious for calcification.       Assessment & Plan:  Osteomyelitis = will have him undergo IR aspirate and sampling for aerobic cx, fungal cx and afb cx. Will  also do path to ensure no signs of malignancy. Will check spep. Await cx results to determine treatment course. Will check bmp, cbc, sed rate and crp

## 2015-03-07 ENCOUNTER — Telehealth: Payer: Self-pay | Admitting: *Deleted

## 2015-03-07 NOTE — Telephone Encounter (Signed)
Patient calling to discuss future plan/treatment, as IR Aspirate was cancelled by radiologist. Please contact patient or relay information for nurse to call him. Landis Gandy, RN

## 2015-03-08 NOTE — Telephone Encounter (Signed)
i will call patient and radiology to follow up on cancelled procedure

## 2015-03-09 ENCOUNTER — Other Ambulatory Visit: Payer: Self-pay | Admitting: Internal Medicine

## 2015-03-09 DIAGNOSIS — M4646 Discitis, unspecified, lumbar region: Secondary | ICD-10-CM

## 2015-03-21 ENCOUNTER — Telehealth: Payer: Self-pay | Admitting: *Deleted

## 2015-03-21 NOTE — Telephone Encounter (Signed)
Patient called, asking about the MRI that Dr. Baxter Flattery scheduled for 8/15.  Insurance authorization [id number] valid 03/21/15 - 05/05/15.  Patient scheduled at [facility] 8/15 9:00 pm, arrive 8:45.  Patient given phone number to scheduling to reschedule as he needs. Landis Gandy, RN

## 2015-03-26 ENCOUNTER — Ambulatory Visit (HOSPITAL_COMMUNITY): Payer: Medicare Other

## 2015-03-29 ENCOUNTER — Ambulatory Visit (HOSPITAL_COMMUNITY)
Admission: RE | Admit: 2015-03-29 | Discharge: 2015-03-29 | Disposition: A | Discharge status: Home / Self Care | Payer: Medicare Other | Source: Ambulatory Visit | Attending: Internal Medicine | Admitting: Internal Medicine

## 2015-03-29 DIAGNOSIS — M47896 Other spondylosis, lumbar region: Secondary | ICD-10-CM | POA: Diagnosis not present

## 2015-03-29 DIAGNOSIS — M4807 Spinal stenosis, lumbosacral region: Secondary | ICD-10-CM | POA: Insufficient documentation

## 2015-03-29 DIAGNOSIS — M5137 Other intervertebral disc degeneration, lumbosacral region: Secondary | ICD-10-CM | POA: Diagnosis not present

## 2015-03-29 DIAGNOSIS — M4646 Discitis, unspecified, lumbar region: Secondary | ICD-10-CM

## 2015-03-29 DIAGNOSIS — M47816 Spondylosis without myelopathy or radiculopathy, lumbar region: Secondary | ICD-10-CM | POA: Diagnosis not present

## 2015-03-29 DIAGNOSIS — M9973 Connective tissue and disc stenosis of intervertebral foramina of lumbar region: Secondary | ICD-10-CM | POA: Diagnosis not present

## 2015-03-29 DIAGNOSIS — M4806 Spinal stenosis, lumbar region: Secondary | ICD-10-CM | POA: Diagnosis not present

## 2015-03-29 DIAGNOSIS — M545 Low back pain: Secondary | ICD-10-CM | POA: Diagnosis present

## 2015-03-29 MED ORDER — GADOBENATE DIMEGLUMINE 529 MG/ML IV SOLN
20.0000 mL | Freq: Once | INTRAVENOUS | Status: AC | PRN
  Administered 2015-03-29: 17 mL via INTRAVENOUS

## 2015-04-02 ENCOUNTER — Telehealth: Payer: Self-pay | Admitting: *Deleted

## 2015-04-02 NOTE — Telephone Encounter (Signed)
Patient called to get the results and follow up for his recent MRI. Advised will have the doctor look at and give him a call back asap. Patient advised home phone is not working at this time and best to call on his cell [phone].

## 2015-04-03 NOTE — Telephone Encounter (Signed)
i called him and gave result.

## 2015-04-09 ENCOUNTER — Other Ambulatory Visit: Payer: Self-pay | Admitting: Family Medicine

## 2015-04-12 ENCOUNTER — Telehealth: Payer: Self-pay | Admitting: *Deleted

## 2015-04-12 NOTE — Telephone Encounter (Signed)
Patient called, stated he thought he would have heard about a plan based on his MRI results and Dr. Storm Frisk consultation with Dr. Estanislado Pandy.  Please advise. Landis Gandy, RN

## 2015-04-18 ENCOUNTER — Telehealth: Payer: Self-pay | Admitting: *Deleted

## 2015-04-18 NOTE — Telephone Encounter (Signed)
Patient calling to check and see 1) what Dr. Baxter Flattery and Dr. Estanislado Pandy decided regarding his most recent MRI (8/18), 2) what the future plan of treatment/observation is.  No follow up appointment scheduled. Patient aware that Dr. Baxter Flattery is unavailable this week.  He requested a report of the MRI to be sent to his house for his personal files. He denies fever, chills or increase in pain since his last visit.  He is concerned about an unintentional 15 pound weight loss over the course of 1 year.  Patient states he was 173 lbs at the [facility] this morning.  He states he is actually eating more than he did 6 months ago, only works out twice weekly in the pool at [employer] - no increase in exercise.  [name] Gandy, RN

## 2015-04-20 NOTE — Telephone Encounter (Signed)
Relayed the message to the patient.  Thank you!

## 2015-04-20 NOTE — Telephone Encounter (Signed)
i will check in with dr. Patrecia Pour since i haven't heard back. i reviewed his mri and felt that this does not represent relapse discitis.

## 2015-04-24 NOTE — Telephone Encounter (Signed)
Can you call mr. Sennett back stating that i am waiting to hear back from dr. Patrecia Pour but again I believe that his recent repeat mri is reassuring for NOT looking like infection but rather inflmammtion possbily due to age associated arthritis. At this time, no need for abtx

## 2015-04-24 NOTE — Telephone Encounter (Signed)
Attempted to call patient.  No answer, unable to leave message.  Will try again.

## 2015-04-25 NOTE — Telephone Encounter (Signed)
Left message with wife, they will await Dr. Kirke Corin interpretation as well. Thank you.

## 2015-05-04 ENCOUNTER — Telehealth (HOSPITAL_COMMUNITY): Payer: Self-pay | Admitting: Interventional Radiology

## 2015-05-04 NOTE — Telephone Encounter (Signed)
Spoke to patient concerning his most recent MRI. [name] [name] does not feel like the pt needs to have an aspiration at this time and pt agrees with this plan of care. JM

## 2015-05-22 ENCOUNTER — Encounter: Payer: Self-pay | Admitting: Cardiology

## 2015-05-22 ENCOUNTER — Ambulatory Visit (INDEPENDENT_AMBULATORY_CARE_PROVIDER_SITE_OTHER): Payer: Medicare Other | Admitting: Cardiology

## 2015-05-22 VITALS — BP 164/82 | HR 72 | Ht 74.0 in | Wt 184.0 lb

## 2015-05-22 DIAGNOSIS — Z9889 Other specified postprocedural states: Secondary | ICD-10-CM | POA: Diagnosis not present

## 2015-05-22 DIAGNOSIS — I493 Ventricular premature depolarization: Secondary | ICD-10-CM

## 2015-05-22 DIAGNOSIS — I10 Essential (primary) hypertension: Secondary | ICD-10-CM

## 2015-05-22 DIAGNOSIS — I25810 Atherosclerosis of coronary artery bypass graft(s) without angina pectoris: Secondary | ICD-10-CM

## 2015-05-22 NOTE — Progress Notes (Signed)
[name] [name] Date of Birth: 1930/03/08   History of Present Illness: [name] [name] is seen today for followup s/p MVR. He is status post mitral valve repair with single vessel coronary bypass surgery (due to refractory coronary spasm)  in 2002. Follow up Echo in April 2012 showed a good MV repair. EF was 55%. He also has a history of HTN and PVC/PACs. On follow up today he is doing very well from a cardiac standpoint. No chest pain or dyspnea. Denies palpitations. He is primarily limited by back and hip pain that makes it difficult to walk. [facility] He moved to [place] this summer.   Current Outpatient Prescriptions on File Prior to Visit  Medication Sig Dispense Refill  . acetaminophen (TYLENOL) 500 MG tablet Take 1,000 mg by mouth every 6 (six) hours as needed for mild pain.    Marland Kitchen amLODipine (NORVASC) 5 MG tablet Take 1 tablet (5 mg total) by mouth every evening. 30 tablet 6  . aspirin 325 MG EC tablet Take 325 mg by mouth every evening.     . beclomethasone (BECONASE AQ) 42 MCG/SPRAY nasal spray Place 2 sprays into both nostrils 1 day or 1 dose. Dose is for each nostril. (Patient taking differently: Place 2 sprays into both nostrils daily as needed for allergies. Dose is for each nostril.(seasonal) April in to June) 25 g 12  . calcium carbonate (TUMS - DOSED IN MG ELEMENTAL CALCIUM) 500 MG chewable tablet Chew 1 tablet by mouth as needed for indigestion or heartburn.    . Cholecalciferol (VITAMIN D) 1000 UNITS capsule Take 1,000 Units by mouth daily.      . colestipol (COLESTID) 5 G packet Take 5 g by mouth 2 (two) times daily. (Patient taking differently: Take 5 g by mouth 2 (two) times daily as needed. ) 30 each 1  . CRESTOR 20 MG tablet take 1 tablet by mouth once daily 30 tablet 5  . dutasteride (AVODART) 0.5 MG capsule Take 0.5 mg by mouth every evening.     . famotidine (PEPCID) 20 MG tablet Take 20 mg by mouth daily as needed for heartburn or indigestion.     . gabapentin (NEURONTIN) 300 MG capsule Take 300 mg by mouth 4 (four) times daily.    Marland Kitchen Hyoscyamine Sulfate 0.375 MG TBCR Take 1 tablet (0.375 mg total) by mouth 2 (two) times daily as needed. 60 tablet 1  . Polyethyl Glycol-Propyl Glycol (SYSTANE) 0.4-0.3 % SOLN Place 1 drop into both eyes as needed (dry eye).    . Tamsulosin HCl (FLOMAX) 0.4 MG CAPS Take 0.4 mg by mouth every evening.     . valsartan-hydrochlorothiazide (DIOVAN-HCT) 320-12.5 MG per tablet take 1 tablet by mouth every morning 90 tablet 1   No current facility-administered medications on file prior to visit.    No Known Allergies  Past Medical History  Diagnosis Date  . MVP (mitral valve prolapse)     s/p Mitral Valve Repair  . CAD (coronary artery disease)     s/p CABG x 1  . HTN (hypertension)   . Hyperlipidemia   . DJD (degenerative joint disease)   . BPH (benign prostatic hyperplasia)   . Arthritis   . Lumbar disc disease   . Pancreatitis   . Gallstones   . Wandering (atrial) pacemaker 11/22/2013    with frequent multifocal ectopy (PACs, PVCs, junctional escape beats    Past Surgical History  Procedure Laterality Date  . Mitral  valve repair  January 2002  . Coronary artery bypass graft  January 2002    Single bypass to the distal RCA  . Colonoscopy  ~2008    no polyps per pt.  Dr. [name]  . Cholecystectomy      Dr. Lennie Hummer  . Upper gastrointestinal endoscopy  02/22/13  . Ercp N/A 02/22/2013    Procedure: ENDOSCOPIC RETROGRADE CHOLANGIOPANCREATOGRAPHY (ERCP);  Surgeon: [name], MD;  Location: [facility];  Service: Endoscopy;  Laterality: N/A;  . Spyglass lithotripsy N/A 02/22/2013    Procedure: [id number] LITHOTRIPSY;  Surgeon: [name], MD;  Location: [facility];  Service: Endoscopy;  Laterality: N/A;  litho ord'd 7/8 by DL/PO [id number] [facility]  . Spyglass cholangioscopy N/A 02/22/2013    Procedure: [id number] CHOLANGIOSCOPY;  Surgeon: [name], MD;  Location: [facility];   Service: Endoscopy;  Laterality: N/A;  . Hernia repair  05/05/11    Lap L inguinal & obturator herniae, R Femoral Hernia    History  Smoking status  . Never Smoker   Smokeless tobacco  . Never Used    History  Alcohol Use  . 0.0 oz/week  . 1-2 Glasses of wine per week    Comment: rare alcohol use    Family History  Problem Relation Age of Onset  . Stroke Father   . Stroke Mother     TIA    Review of Systems: As noted in history of present illness. All other systems were reviewed and are negative.  Physical Exam: BP 164/82 mmHg  Pulse 72  Ht 6' 2"  (1.88 m)  Wt 83.462 kg (184 lb)  BMI 23.61 kg/m2 He is a pleasant white male in no acute distress. His HEENT exam is unremarkable. He has no JVD or bruits. Lungs are clear. Cardiac exam reveals a regular rate and rhythm without gallop or click. There is a soft systolic murmur at the left lower sternal border. Abdomen is soft and nontender without masses or bruits. He has trace  feet edema.    LABORATORY DATA: Lab Results  Component Value Date   WBC 5.6 03/05/2015   HGB 13.8 03/05/2015   HCT 42.3 03/05/2015   PLT 166 03/05/2015   GLUCOSE 92 02/27/2015   CHOL 104 10/13/2014   TRIG 85 10/13/2014   HDL 43 10/13/2014   LDLCALC 44 10/13/2014   ALT 31 10/13/2014   AST 31 10/13/2014   NA 143 02/27/2015   K 3.8 02/27/2015   CL 102 02/27/2015   CREATININE 1.04 02/27/2015   BUN 20 02/27/2015   CO2 29 02/27/2015   TSH 2.27 07/14/2013   INR 1.15 03/05/2015     Assessment / Plan: 1. Mitral insufficiency status post mitral valve repair. Excellent long-term result.  2. Coronary disease status post single-vessel coronary bypass surgery with a vein graft to the distal RCA due to refractory spasm. He is asymptomatic.  3. Hypertension, [name] pressure is elevated today but has been under good control when [facility]. Will monitor for now.   4. Chronic PACs and PVCs. These are asymptomatic now. Wandering  atrial pacemaker by prior Ecg (not Afib).

## 2015-05-22 NOTE — Patient Instructions (Signed)
Continue your current therapy  I will see you in 6 months.   

## 2015-06-13 ENCOUNTER — Encounter: Payer: Self-pay | Admitting: Family Medicine

## 2015-06-13 ENCOUNTER — Ambulatory Visit (INDEPENDENT_AMBULATORY_CARE_PROVIDER_SITE_OTHER): Payer: Medicare Other | Admitting: Family Medicine

## 2015-06-13 VITALS — BP 110/60 | HR 62 | Ht 73.0 in | Wt 184.8 lb

## 2015-06-13 DIAGNOSIS — R140 Abdominal distension (gaseous): Secondary | ICD-10-CM

## 2015-06-13 DIAGNOSIS — Z23 Encounter for immunization: Secondary | ICD-10-CM

## 2015-06-13 DIAGNOSIS — G59 Mononeuropathy in diseases classified elsewhere: Secondary | ICD-10-CM

## 2015-06-13 DIAGNOSIS — M48061 Spinal stenosis, lumbar region without neurogenic claudication: Secondary | ICD-10-CM

## 2015-06-13 DIAGNOSIS — M1990 Unspecified osteoarthritis, unspecified site: Secondary | ICD-10-CM

## 2015-06-13 DIAGNOSIS — M4806 Spinal stenosis, lumbar region: Secondary | ICD-10-CM | POA: Diagnosis not present

## 2015-06-13 NOTE — Progress Notes (Signed)
   Subjective:    Patient ID: [name] [name], male    DOB: 29-Oct-1929, 81 y.o.   MRN: [id number]  HPI Here for consult concerning multiple issues. He continues to have abdominal symptoms. He has had an extensive workup and was admitted at one point with pancreatitis. He has had previous cholecystectomy and did have an ERCP. It did show some stones. Presently he is having difficulty with bloating, gas and pain postprandially. It is not occurring regularly. He would like to get this further evaluated as it is continuing to cause him difficulty. He continues to have a neuropathy. He has seen Dr. Hardin Negus in the past for this. Presently he is on Neurontin 300 4 times a day as well as Tylenol on an as-needed basis. Review his record indicates he has multiple problems to be contributing to this including spinal stenosis, arthritis. He also complains of right-sided CVA pain especially with motion and with palpation. No history of injury or overuse.   Review of Systems     Objective:   Physical Exam Alert and in no distress. Slight tenderness palpation in the right CVA paravertebral muscle area. Good motion of his back.       Assessment & Plan:  Postprandial abdominal bloating - Plan: Ambulatory referral to Gastroenterology  Need for prophylactic vaccination against Streptococcus pneumoniae (pneumococcus) - Plan: Pneumococcal conjugate vaccine 13-valent  Arthritis  Mononeuropathy due to underlying disease  Spinal stenosis of lumbar region  I will refer him to gastroenterology for further evaluation of symptoms that sound very much like a stone problem. Discussed the neuropathy and the pain that he is having. Recommend he continue on his present dosing of gabapentin and use Tylenol as a supplement rather than trying to increase the gabapentin. Explained that the paravertebral pain is probably referred pain from the multiple causes of his back and hip discomfort. His immunizations were also  updated. Approximately 30 minutes, the majority of this spent in counseling and coordination of care.

## 2015-06-13 NOTE — Patient Instructions (Signed)
Increase your tylenol for the back pain

## 2015-06-21 ENCOUNTER — Encounter: Payer: Medicare Other | Admitting: Family Medicine

## 2015-07-03 DIAGNOSIS — Z85828 Personal history of other malignant neoplasm of skin: Secondary | ICD-10-CM | POA: Diagnosis not present

## 2015-07-03 DIAGNOSIS — L570 Actinic keratosis: Secondary | ICD-10-CM | POA: Diagnosis not present

## 2015-07-03 DIAGNOSIS — L821 Other seborrheic keratosis: Secondary | ICD-10-CM | POA: Diagnosis not present

## 2015-07-09 ENCOUNTER — Other Ambulatory Visit (HOSPITAL_COMMUNITY): Payer: Self-pay | Admitting: *Deleted

## 2015-07-13 ENCOUNTER — Other Ambulatory Visit: Payer: Self-pay

## 2015-07-13 ENCOUNTER — Encounter: Payer: Self-pay | Admitting: Physician Assistant

## 2015-07-13 ENCOUNTER — Other Ambulatory Visit (INDEPENDENT_AMBULATORY_CARE_PROVIDER_SITE_OTHER): Payer: Medicare Other

## 2015-07-13 ENCOUNTER — Ambulatory Visit (INDEPENDENT_AMBULATORY_CARE_PROVIDER_SITE_OTHER): Payer: Medicare Other | Admitting: Physician Assistant

## 2015-07-13 VITALS — BP 160/90 | HR 53 | Ht 74.0 in | Wt 188.6 lb

## 2015-07-13 DIAGNOSIS — G8929 Other chronic pain: Secondary | ICD-10-CM

## 2015-07-13 DIAGNOSIS — R1013 Epigastric pain: Secondary | ICD-10-CM

## 2015-07-13 DIAGNOSIS — K589 Irritable bowel syndrome without diarrhea: Secondary | ICD-10-CM

## 2015-07-13 LAB — CBC WITH DIFFERENTIAL/PLATELET
Basophils Absolute: 0 10*3/uL (ref 0.0–0.1)
Basophils Relative: 0.4 % (ref 0.0–3.0)
EOS ABS: 0.2 10*3/uL (ref 0.0–0.7)
Eosinophils Relative: 2.4 % (ref 0.0–5.0)
HCT: 41.4 % (ref 39.0–52.0)
HEMOGLOBIN: 13.7 g/dL (ref 13.0–17.0)
LYMPHS ABS: 1 10*3/uL (ref 0.7–4.0)
Lymphocytes Relative: 14.9 % (ref 12.0–46.0)
MCHC: 33.1 g/dL (ref 30.0–36.0)
MCV: 88.6 fl (ref 78.0–100.0)
MONO ABS: 0.5 10*3/uL (ref 0.1–1.0)
Monocytes Relative: 7.8 % (ref 3.0–12.0)
NEUTROS PCT: 74.5 % (ref 43.0–77.0)
Neutro Abs: 5 10*3/uL (ref 1.4–7.7)
Platelets: 207 10*3/uL (ref 150.0–400.0)
RBC: 4.67 Mil/uL (ref 4.22–5.81)
RDW: 14.3 % (ref 11.5–15.5)
WBC: 6.8 10*3/uL (ref 4.0–10.5)

## 2015-07-13 LAB — LIPASE: Lipase: 29 U/L (ref 11.0–59.0)

## 2015-07-13 LAB — HEPATIC FUNCTION PANEL
ALK PHOS: 82 U/L (ref 39–117)
ALT: 13 U/L (ref 0–53)
AST: 20 U/L (ref 0–37)
Albumin: 3.9 g/dL (ref 3.5–5.2)
BILIRUBIN DIRECT: 0.1 mg/dL (ref 0.0–0.3)
BILIRUBIN TOTAL: 0.7 mg/dL (ref 0.2–1.2)
TOTAL PROTEIN: 7.3 g/dL (ref 6.0–8.3)

## 2015-07-13 LAB — BASIC METABOLIC PANEL
BUN: 18 mg/dL (ref 6–23)
CALCIUM: 9 mg/dL (ref 8.4–10.5)
CO2: 28 meq/L (ref 19–32)
Chloride: 104 mEq/L (ref 96–112)
Creatinine, Ser: 0.91 mg/dL (ref 0.40–1.50)
GFR: 84.53 mL/min (ref >60.00)
GLUCOSE: 87 mg/dL (ref 70–99)
POTASSIUM: 3.7 meq/L (ref 3.5–5.1)
SODIUM: 141 meq/L (ref 135–145)

## 2015-07-13 LAB — GAMMA GT: GGT: 23 U/L (ref 7–51)

## 2015-07-13 MED ORDER — HYOSCYAMINE SULFATE 0.125 MG SL SUBL: SUBLINGUAL_TABLET | SUBLINGUAL | Status: DC

## 2015-07-13 NOTE — Progress Notes (Signed)
Patient ID: [name] [name], male   DOB: 1930/06/10, 81 y.o.   MRN: [id number]     History of Present Illness: [name] [name] is a pleasant 81 year old male who has previously been followed by Dr. Deatra Ina. He has a history of CAD, hypertension, PVCs, PACs, peripheral neuropathy, hyperlipidemia, status post mitral valve repair, fatigue, wondering atrial pacemaker, and lumbar spinal stenosis. He has been seen on multiple occasions for bloating, gas, and bowel changes. He is status post a cholecystectomy and underwent an ERCP in July 2014 with sphincterotomy and removal of several CBD stones. He presents today stating that on October 31 he had an episode of acute onset epigastric pain while eating dinner that felt like pancreatitis. He got up from the dinner table and went to go to the bathroom and the pain was so severe he had to lean against the counter and he passed out. The nurse at [facility] was called. His blood pressure was noted to be low. He was told to lay down and the pain subsided after 15 minutes. He saw his primary care provider the next day. He was told to follow up.. At this point he relates that since that time he has had intermittent episodes of epigastric pain that tends to occur postprandially and is associated with bloating and gas. He often has an urgent bowel movement. He has had no fever, chills, or night sweats. He has had no bright red blood per rectum or melena. His last colonoscopy was in July 1998 at which time he was noted to have diverticulosis. He denies complaints of heartburn, regurgitation, vomiting, or early satiety. When the pain occurs he sometimes has some transient nausea with it. He does report that his stools have been stringy and sometimes loose and often accompanied by a large amount of explosive gas when he sits on the commode. He had been on cholestyramine in the past but has not had to use it in several years because he does not have diarrhea.   Past Medical  History  Diagnosis Date  . MVP (mitral valve prolapse)     s/p Mitral Valve Repair  . CAD (coronary artery disease)     s/p CABG x 1  . HTN (hypertension)   . Hyperlipidemia   . DJD (degenerative joint disease)   . BPH (benign prostatic hyperplasia)   . Arthritis   . Lumbar disc disease   . Pancreatitis   . Gallstones   . Wandering (atrial) pacemaker 11/22/2013    with frequent multifocal ectopy (PACs, PVCs, junctional escape beats    Past Surgical History  Procedure Laterality Date  . Mitral valve repair  January 2002  . Coronary artery bypass graft  January 2002    Single bypass to the distal RCA  . Colonoscopy  ~2008    no polyps per pt.  Dr. [name]  . Cholecystectomy      Dr. Lennie Hummer  . Upper gastrointestinal endoscopy  02/22/13  . Ercp N/A 02/22/2013    Procedure: ENDOSCOPIC RETROGRADE CHOLANGIOPANCREATOGRAPHY (ERCP);  Surgeon: [name], MD;  Location: [facility];  Service: Endoscopy;  Laterality: N/A;  . Spyglass lithotripsy N/A 02/22/2013    Procedure: FAOZHYQM LITHOTRIPSY;  Surgeon: [name], MD;  Location: [facility];  Service: Endoscopy;  Laterality: N/A;  litho ord'd 7/8 by DL/PO [id number] [facility]  . Spyglass cholangioscopy N/A 02/22/2013    Procedure: [id number] CHOLANGIOSCOPY;  Surgeon: [name], MD;  Location: [facility];  Service: Endoscopy;  Laterality: N/A;  . Hernia repair  05/05/11    Lap L inguinal & obturator herniae, R Femoral Hernia   Family History  Problem Relation Age of Onset  . Stroke Father   . Stroke Mother     TIA   Social History  Substance Use Topics  . Smoking status: Never Smoker   . Smokeless tobacco: Never Used  . Alcohol Use: 0.0 oz/week    1-2 Glasses of wine per week     Comment: rare alcohol use   Current Outpatient Prescriptions  Medication Sig Dispense Refill  . acetaminophen (TYLENOL) 500 MG tablet Take 1,000 mg by mouth every 6 (six) hours as needed for mild pain.    Marland Kitchen amLODipine (NORVASC) 5 MG  tablet Take 1 tablet (5 mg total) by mouth every evening. 30 tablet 6  . amoxicillin (AMOXIL) 500 MG capsule Take 4 capsules by mouth 1 day or 1 dose. 1 hour before dental appt.  0  . aspirin 325 MG EC tablet Take 325 mg by mouth every evening.     . beclomethasone (BECONASE AQ) 42 MCG/SPRAY nasal spray Place 2 sprays into both nostrils 1 day or 1 dose. Dose is for each nostril. (Patient taking differently: Place 2 sprays into both nostrils daily as needed for allergies. Dose is for each nostril.(seasonal) April in to June) 25 g 12  . calcium carbonate (TUMS - DOSED IN MG ELEMENTAL CALCIUM) 500 MG chewable tablet Chew 1 tablet by mouth as needed for indigestion or heartburn.    . Cholecalciferol (VITAMIN D) 1000 UNITS capsule Take 1,000 Units by mouth daily.      . CRESTOR 20 MG tablet take 1 tablet by mouth once daily 30 tablet 5  . dutasteride (AVODART) 0.5 MG capsule Take 0.5 mg by mouth every evening.     . famotidine (PEPCID) 20 MG tablet Take 20 mg by mouth daily as needed for heartburn or indigestion.    . gabapentin (NEURONTIN) 300 MG capsule Take 300 mg by mouth 4 (four) times daily.    [name] Glycol-Propyl Glycol (SYSTANE) 0.4-0.3 % SOLN Place 1 drop into both eyes as needed (dry eye).    . Tamsulosin HCl (FLOMAX) 0.4 MG CAPS Take 0.4 mg by mouth every evening.     . valsartan-hydrochlorothiazide (DIOVAN-HCT) 320-12.5 MG per tablet take 1 tablet by mouth every morning 90 tablet 1  . hyoscyamine (LEVSIN SL) 0.125 MG SL tablet One po every 8 hrs as needed for cramps 60 tablet 1   No current facility-administered medications for this visit.   No Known Allergies   Review of Systems: Per history of present illness otherwise negative.    Physical Exam: BP 160/90 mmHg  Pulse 53  Ht 6' 2"  (1.88 m)  Wt 188 lb 9.6 oz (85.548 kg)  BMI 24.20 kg/m2 General: Pleasant, well developed , white male in no acute distress Head: Normocephalic and atraumatic Eyes: sclerae anicteric,  conjunctiva pink  Ears: Normal auditory acuity Lungs: Clear throughout to auscultation Heart: Regular rate and rhythm Abdomen: Soft, non distended, non-tender. No masses, no hepatomegaly. Normal bowel sounds Musculoskeletal: Symmetrical with no gross deformities  Extremities: No edema  Neurological: Alert oriented x 4, grossly nonfocal Psychological: Alert and cooperative. Normal mood and affect  Assessment and Recommendations: 81 year old male status post cholecystectomy and ERCP with sphincterotomy in the remote past presenting with chronic intermittent bloating, and upper abdominal discomfort. Symptoms seem to be functional in nature and have resolved. He has had relief of  these symptoms with use of antispasmodics in the past, he will again be given a trial of Levsin SL 0.125 mg one sublingually every 8 hours when necessary cramps. We have discussed a trial of Xifaxan or low-dose Flagyl for possible bacterial overgrowth, but will hold off for now. If his ultrasound is negative, we will consider empiric treatment for bacterial overgrowth. We will obtain an abdominal ultrasound to reevaluate the biliary tree though he has had a sphincterotomy in July 2014. A CBC, metabolic panel, lipase, hepatic function panel, and GGT will be obtained. He will also be given a trial of Benefiber 1 heaping tablespoon in 6 ounces of water every morning to see if this helps bulk his stools a little. Follow-up will be determined pending the findings of his ultrasound and laboratory studies. The patient would like to establish care with Dr. Carlean Purl.        Tayona Sarnowski, Deloris Ping 07/13/2015,

## 2015-07-13 NOTE — Patient Instructions (Signed)
You have been scheduled for an abdominal ultrasound at [facility] (1st floor of hospital) on 07-19-2015 at [place]. Please arrive 15 minutes prior to your appointment for registration. Make certain not to have anything to eat or drink 6 hours prior to your appointment. Should you need to reschedule your appointment, please contact radiology at [phone]. This test typically takes about 30 minutes to perform.  We have sent the following medications to your pharmacy for you to pick up at your convenience: [place] has requested that you go to the basement for work before leaving today.  Please use Benefiber 1 tbsp in 6 oz of water every morning.

## 2015-07-16 NOTE — Progress Notes (Signed)
Agree w/ Ms. Hvozdovic's note and mangement.

## 2015-07-17 ENCOUNTER — Telehealth: Payer: Self-pay | Admitting: Physician Assistant

## 2015-07-19 ENCOUNTER — Ambulatory Visit (HOSPITAL_COMMUNITY): Payer: Medicare Other

## 2015-07-19 NOTE — Telephone Encounter (Signed)
Patient calling back regarding this. He is requesting a call back [phone]

## 2015-07-19 NOTE — Telephone Encounter (Signed)
Pt is requesting an alternative for Levsin. Please advise.

## 2015-07-19 NOTE — Telephone Encounter (Signed)
Can try Bentyl 10 mg every 8 hours when necessary

## 2015-07-20 ENCOUNTER — Ambulatory Visit
Admission: RE | Admit: 2015-07-20 | Discharge: 2015-07-20 | Disposition: A | Discharge status: Home / Self Care | Payer: Medicare Other | Source: Ambulatory Visit | Attending: Physician Assistant | Admitting: Physician Assistant

## 2015-07-20 DIAGNOSIS — R1013 Epigastric pain: Secondary | ICD-10-CM | POA: Diagnosis not present

## 2015-07-20 MED ORDER — DICYCLOMINE HCL 10 MG PO CAPS: 10.0000 mg | ORAL_CAPSULE | Freq: Three times a day (TID) | ORAL | Status: DC | PRN

## 2015-07-20 NOTE — Telephone Encounter (Signed)
Left a voicemail with new medication directions of the Bentyl. Rx sent to pharmacy pt has selected in [place].

## 2015-07-25 DIAGNOSIS — N401 Enlarged prostate with lower urinary tract symptoms: Secondary | ICD-10-CM | POA: Diagnosis not present

## 2015-07-25 DIAGNOSIS — R3916 Straining to void: Secondary | ICD-10-CM | POA: Diagnosis not present

## 2015-08-07 ENCOUNTER — Other Ambulatory Visit: Payer: Self-pay

## 2015-08-07 ENCOUNTER — Other Ambulatory Visit (HOSPITAL_COMMUNITY): Payer: Self-pay | Admitting: *Deleted

## 2015-08-07 MED ORDER — AMLODIPINE BESYLATE 5 MG PO TABS: 5.0000 mg | ORAL_TABLET | Freq: Every evening | ORAL | Status: DC

## 2015-08-14 ENCOUNTER — Other Ambulatory Visit (HOSPITAL_COMMUNITY): Payer: Self-pay | Admitting: *Deleted

## 2015-08-22 ENCOUNTER — Other Ambulatory Visit (HOSPITAL_COMMUNITY): Payer: Self-pay | Admitting: *Deleted

## 2015-09-12 ENCOUNTER — Other Ambulatory Visit (HOSPITAL_COMMUNITY): Payer: Self-pay | Admitting: *Deleted

## 2015-09-19 ENCOUNTER — Telehealth: Payer: Self-pay | Admitting: Physician Assistant

## 2015-09-19 ENCOUNTER — Ambulatory Visit (INDEPENDENT_AMBULATORY_CARE_PROVIDER_SITE_OTHER): Payer: Medicare Other | Admitting: Physician Assistant

## 2015-09-19 ENCOUNTER — Observation Stay (HOSPITAL_COMMUNITY)
Admission: AD | Admit: 2015-09-19 | Discharge: 2015-09-22 | Disposition: A | Discharge status: Home / Self Care | Payer: Medicare Other | Source: Ambulatory Visit | Attending: Internal Medicine | Admitting: Internal Medicine

## 2015-09-19 ENCOUNTER — Other Ambulatory Visit (INDEPENDENT_AMBULATORY_CARE_PROVIDER_SITE_OTHER): Payer: Medicare Other

## 2015-09-19 ENCOUNTER — Encounter: Payer: Self-pay | Admitting: Physician Assistant

## 2015-09-19 VITALS — BP 100/68 | HR 68 | Temp 98.5°F | Ht 74.0 in | Wt 189.6 lb

## 2015-09-19 DIAGNOSIS — K8050 Calculus of bile duct without cholangitis or cholecystitis without obstruction: Secondary | ICD-10-CM | POA: Diagnosis not present

## 2015-09-19 DIAGNOSIS — N400 Enlarged prostate without lower urinary tract symptoms: Secondary | ICD-10-CM | POA: Diagnosis not present

## 2015-09-19 DIAGNOSIS — Z9049 Acquired absence of other specified parts of digestive tract: Secondary | ICD-10-CM | POA: Insufficient documentation

## 2015-09-19 DIAGNOSIS — R6889 Other general symptoms and signs: Secondary | ICD-10-CM | POA: Diagnosis not present

## 2015-09-19 DIAGNOSIS — Z7982 Long term (current) use of aspirin: Secondary | ICD-10-CM | POA: Diagnosis not present

## 2015-09-19 DIAGNOSIS — R740 Nonspecific elevation of levels of transaminase and lactic acid dehydrogenase [LDH]: Secondary | ICD-10-CM | POA: Insufficient documentation

## 2015-09-19 DIAGNOSIS — Z9889 Other specified postprocedural states: Secondary | ICD-10-CM | POA: Diagnosis not present

## 2015-09-19 DIAGNOSIS — R1084 Generalized abdominal pain: Secondary | ICD-10-CM | POA: Diagnosis not present

## 2015-09-19 DIAGNOSIS — I2510 Atherosclerotic heart disease of native coronary artery without angina pectoris: Secondary | ICD-10-CM | POA: Insufficient documentation

## 2015-09-19 DIAGNOSIS — Z950 Presence of cardiac pacemaker: Secondary | ICD-10-CM | POA: Insufficient documentation

## 2015-09-19 DIAGNOSIS — Z951 Presence of aortocoronary bypass graft: Secondary | ICD-10-CM | POA: Insufficient documentation

## 2015-09-19 DIAGNOSIS — E785 Hyperlipidemia, unspecified: Secondary | ICD-10-CM | POA: Insufficient documentation

## 2015-09-19 DIAGNOSIS — I10 Essential (primary) hypertension: Secondary | ICD-10-CM | POA: Insufficient documentation

## 2015-09-19 DIAGNOSIS — I9589 Other hypotension: Secondary | ICD-10-CM

## 2015-09-19 DIAGNOSIS — Z79899 Other long term (current) drug therapy: Secondary | ICD-10-CM | POA: Insufficient documentation

## 2015-09-19 DIAGNOSIS — R7401 Elevation of levels of liver transaminase levels: Secondary | ICD-10-CM

## 2015-09-19 DIAGNOSIS — K8309 Other cholangitis: Secondary | ICD-10-CM

## 2015-09-19 DIAGNOSIS — K838 Other specified diseases of biliary tract: Principal | ICD-10-CM | POA: Insufficient documentation

## 2015-09-19 DIAGNOSIS — E876 Hypokalemia: Secondary | ICD-10-CM | POA: Diagnosis not present

## 2015-09-19 DIAGNOSIS — I959 Hypotension, unspecified: Secondary | ICD-10-CM | POA: Diagnosis not present

## 2015-09-19 DIAGNOSIS — M1990 Unspecified osteoarthritis, unspecified site: Secondary | ICD-10-CM | POA: Insufficient documentation

## 2015-09-19 DIAGNOSIS — R109 Unspecified abdominal pain: Secondary | ICD-10-CM

## 2015-09-19 DIAGNOSIS — R1013 Epigastric pain: Secondary | ICD-10-CM | POA: Diagnosis not present

## 2015-09-19 LAB — CBC WITH DIFFERENTIAL/PLATELET
BASOS ABS: 0.1 10*3/uL (ref 0.0–0.1)
Basophils Relative: 0.8 % (ref 0.0–3.0)
EOS PCT: 0.1 % (ref 0.0–5.0)
Eosinophils Absolute: 0 10*3/uL (ref 0.0–0.7)
HEMATOCRIT: 41.3 % (ref 39.0–52.0)
Hemoglobin: 13.6 g/dL (ref 13.0–17.0)
LYMPHS ABS: 0.5 10*3/uL — AB (ref 0.7–4.0)
LYMPHS PCT: 4.9 % — AB (ref 12.0–46.0)
MCHC: 33 g/dL (ref 30.0–36.0)
MCV: 88.8 fl (ref 78.0–100.0)
MONOS PCT: 6.8 % (ref 3.0–12.0)
Monocytes Absolute: 0.7 10*3/uL (ref 0.1–1.0)
NEUTROS ABS: 8.9 10*3/uL — AB (ref 1.4–7.7)
NEUTROS PCT: 87.4 % — AB (ref 43.0–77.0)
PLATELETS: 187 10*3/uL (ref 150.0–400.0)
RBC: 4.65 Mil/uL (ref 4.22–5.81)
RDW: 14.4 % (ref 11.5–15.5)
WBC: 10.2 10*3/uL (ref 4.0–10.5)

## 2015-09-19 LAB — COMPREHENSIVE METABOLIC PANEL
ALK PHOS: 456 U/L — AB (ref 39–117)
ALT: 241 U/L — AB (ref 0–53)
AST: 343 U/L — ABNORMAL HIGH (ref 0–37)
Albumin: 3.7 g/dL (ref 3.5–5.2)
BILIRUBIN TOTAL: 1.5 mg/dL — AB (ref 0.2–1.2)
BUN: 17 mg/dL (ref 6–23)
CALCIUM: 8.7 mg/dL (ref 8.4–10.5)
CO2: 28 mEq/L (ref 19–32)
Chloride: 102 mEq/L (ref 96–112)
Creatinine, Ser: 1.12 mg/dL (ref 0.40–1.50)
GFR: 66.49 mL/min (ref >60.00)
GLUCOSE: 117 mg/dL — AB (ref 70–99)
POTASSIUM: 3.3 meq/L — AB (ref 3.5–5.1)
Sodium: 139 mEq/L (ref 135–145)
TOTAL PROTEIN: 6.9 g/dL (ref 6.0–8.3)

## 2015-09-19 LAB — CREATININE, SERUM
Creatinine, Ser: 0.97 mg/dL (ref 0.61–1.24)
GFR calc non Af Amer: >60 mL/min (ref >60)

## 2015-09-19 LAB — CBC
HEMATOCRIT: 37.8 % — AB (ref 39.0–52.0)
Hemoglobin: 12.3 g/dL — ABNORMAL LOW (ref 13.0–17.0)
MCH: 29.9 pg (ref 26.0–34.0)
MCHC: 32.5 g/dL (ref 30.0–36.0)
MCV: 92 fL (ref 78.0–100.0)
PLATELETS: 166 10*3/uL (ref 150–400)
RBC: 4.11 MIL/uL — ABNORMAL LOW (ref 4.22–5.81)
RDW: 13.7 % (ref 11.5–15.5)
WBC: 6.3 10*3/uL (ref 4.0–10.5)

## 2015-09-19 LAB — LIPASE: Lipase: 16 U/L (ref 11.0–59.0)

## 2015-09-19 MED ORDER — IRBESARTAN 300 MG PO TABS
300.0000 mg | ORAL_TABLET | Freq: Every day | ORAL | Status: DC
  Administered 2015-09-19 – 2015-09-22 (×3): 300 mg via ORAL
  Filled 2015-09-19 (×4): qty 1

## 2015-09-19 MED ORDER — DUTASTERIDE 0.5 MG PO CAPS
0.5000 mg | ORAL_CAPSULE | Freq: Every evening | ORAL | Status: DC
Start: 2015-09-19 — End: 2015-09-22
  Administered 2015-09-19 – 2015-09-21 (×3): 0.5 mg via ORAL
  Filled 2015-09-19 (×4): qty 1

## 2015-09-19 MED ORDER — FAMOTIDINE 20 MG PO TABS
20.0000 mg | ORAL_TABLET | Freq: Every day | ORAL | Status: DC | PRN
  Filled 2015-09-19: qty 1

## 2015-09-19 MED ORDER — ACETAMINOPHEN 325 MG PO TABS: 650.0000 mg | ORAL_TABLET | Freq: Four times a day (QID) | ORAL | Status: DC | PRN

## 2015-09-19 MED ORDER — HEPARIN SODIUM (PORCINE) 5000 UNIT/ML IJ SOLN
5000.0000 [IU] | Freq: Three times a day (TID) | INTRAMUSCULAR | Status: DC
  Administered 2015-09-19 – 2015-09-22 (×7): 5000 [IU] via SUBCUTANEOUS
  Filled 2015-09-19 (×11): qty 1

## 2015-09-19 MED ORDER — MORPHINE SULFATE (PF) 2 MG/ML IV SOLN: 2.0000 mg | INTRAVENOUS | Status: DC | PRN

## 2015-09-19 MED ORDER — AMLODIPINE BESYLATE 5 MG PO TABS
5.0000 mg | ORAL_TABLET | Freq: Every evening | ORAL | Status: DC
  Administered 2015-09-19 – 2015-09-21 (×3): 5 mg via ORAL
  Filled 2015-09-19 (×4): qty 1

## 2015-09-19 MED ORDER — POTASSIUM CHLORIDE CRYS ER 20 MEQ PO TBCR
40.0000 meq | EXTENDED_RELEASE_TABLET | Freq: Once | ORAL | Status: AC
  Administered 2015-09-19: 40 meq via ORAL
  Filled 2015-09-19: qty 2

## 2015-09-19 MED ORDER — DICYCLOMINE HCL 10 MG PO CAPS
10.0000 mg | ORAL_CAPSULE | Freq: Three times a day (TID) | ORAL | Status: DC | PRN
  Filled 2015-09-19: qty 1

## 2015-09-19 MED ORDER — ONDANSETRON HCL 4 MG PO TABS: 4.0000 mg | ORAL_TABLET | Freq: Four times a day (QID) | ORAL | Status: DC | PRN

## 2015-09-19 MED ORDER — SODIUM CHLORIDE 0.9 % IV SOLN
INTRAVENOUS | Status: DC
Start: 2015-09-19 — End: 2015-09-22
  Administered 2015-09-19 – 2015-09-22 (×3): via INTRAVENOUS

## 2015-09-19 MED ORDER — ONDANSETRON HCL 4 MG/2ML IJ SOLN: 4.0000 mg | Freq: Four times a day (QID) | INTRAMUSCULAR | Status: DC | PRN

## 2015-09-19 MED ORDER — ACETAMINOPHEN 650 MG RE SUPP: 650.0000 mg | Freq: Four times a day (QID) | RECTAL | Status: DC | PRN

## 2015-09-19 MED ORDER — HYDROCODONE-ACETAMINOPHEN 5-325 MG PO TABS
1.0000 | ORAL_TABLET | ORAL | Status: DC | PRN
  Administered 2015-09-20: 2 via ORAL
  Filled 2015-09-19: qty 2

## 2015-09-19 MED ORDER — GABAPENTIN 300 MG PO CAPS
300.0000 mg | ORAL_CAPSULE | Freq: Four times a day (QID) | ORAL | Status: DC
  Administered 2015-09-19 – 2015-09-22 (×7): 300 mg via ORAL
  Filled 2015-09-19 (×14): qty 1

## 2015-09-19 NOTE — Telephone Encounter (Signed)
Patient states he is having upper abdominal pain in stomach area like he had back in December when he saw [place]. States he had an episode last night and he took Bentyl. He did feel some better and was able to sleep. He had another episode this morning after eating toast and egg. He rated it close to a "10". He took Bentyl and it did get better. He is concerned that he had a hx of stones and pancreatitis that he may have some thing going on again. Scheduled with Nicoletta Ba, PA today at 2:30 PM.

## 2015-09-19 NOTE — H&P (Signed)
[facility] History and Physical  [name] [name] WPY:[id number] DOB: 06/28/1930 DOA: 09/19/2015   PCP: Wyatt Haste, MD    Chief Complaint: Abdominal pain  HPI: [name] [name] is a 82 y.o. male with past medical history of coronary artery disease status post CABG, mitral valve repair, hypertension, BPH, hyperlipidemia. The patient presents for epigastric pain associated with shakes. He states that he had one episode yesterday around midday. He had had positive with tomato sauce for lunch. He cannot recall how long afterwards he had the pain. But he describes the pain as 10 out of 10 in intensity associated with shaking of his arms and lasted for 30 minutes to an hour. He did not have any fever. He felt nauseated but did not vomit. He has not had any diarrhea. He had a second episode of pain this morning lasting about 1 hour after eating a piece of dry toast and having a small amount of coffee. In the past about 2 years ago he had similar pain and underwent an ERCP which revealed that he had common bile duct stones. He had a cholecystectomy performed greater than 10 years ago. Currently he has some mild abdominal discomfort but no other GI symptoms. He went to see his gastroenterologist today and was found to have mildly elevated AST, ALT and total bilirubin and was sent to the hospital to be admitted for further workup. Again, no documented fevers. WBC count is normal at 10.2. Of note, he was previously placed on Bentyl when he was having these types of pains and he is continuing to take this occasionally for his pain.    General: The patient denies anorexia, fever, weight loss Cardiac: Denies chest pain, syncope, palpitations, sometimes has pedal edema  Respiratory: Denies cough, shortness of breath, wheezing GI: Denies severe indigestion/heartburn, vomiting, diarrhea and constipation GU: Denies hematuria, incontinence, dysuria  Musculoskeletal: Denies arthritis  Skin: Denies  suspicious skin lesions Neurologic: Denies focal weakness or numbness, change in vision Psychiatry: Denies depression or anxiety. Hematologic: no easy bruising or bleeding  All other systems reviewed and found to be negative.  Past Medical History  Diagnosis Date  . MVP (mitral valve prolapse)     s/p Mitral Valve Repair  . CAD (coronary artery disease)     s/p CABG x 1  . HTN (hypertension)   . Hyperlipidemia   . DJD (degenerative joint disease)   . BPH (benign prostatic hyperplasia)   . Arthritis   . Lumbar disc disease   . Pancreatitis   . Gallstones   . Wandering (atrial) pacemaker 11/22/2013    with frequent multifocal ectopy (PACs, PVCs, junctional escape beats    Past Surgical History  Procedure Laterality Date  . Mitral valve repair  January 2002  . Coronary artery bypass graft  January 2002    Single bypass to the distal RCA  . Colonoscopy  ~2008    no polyps per pt.  Dr. [name]  . Cholecystectomy      Dr. Lennie Hummer  . Upper gastrointestinal endoscopy  02/22/13  . Ercp N/A 02/22/2013    Procedure: ENDOSCOPIC RETROGRADE CHOLANGIOPANCREATOGRAPHY (ERCP);  Surgeon: [name], MD;  Location: [facility];  Service: Endoscopy;  Laterality: N/A;  . Spyglass lithotripsy N/A 02/22/2013    Procedure: [id number] LITHOTRIPSY;  Surgeon: [name], MD;  Location: [facility];  Service: Endoscopy;  Laterality: N/A;  litho ord'd 7/8 by DL/PO [id number] [facility]  . Spyglass cholangioscopy N/A 02/22/2013  Procedure: SPYGLASS CHOLANGIOSCOPY;  Surgeon: [name], MD;  Location: [facility];  Service: Endoscopy;  Laterality: N/A;  . Hernia repair  05/05/11    Lap L inguinal & obturator herniae, R Femoral Hernia    Social History: Not smoke or drink Lives at home. Able to manage ADLs   No Known Allergies  Family history:   Family History  Problem Relation Age of Onset  . Stroke Father   . Stroke Mother     TIA      Prior to Admission medications    Medication Sig Start Date End Date Taking? Authorizing Provider  acetaminophen (TYLENOL) 500 MG tablet Take 1,000 mg by mouth every 6 (six) hours as needed for mild pain.    Historical Provider, MD  amLODipine (NORVASC) 5 MG tablet Take 1 tablet (5 mg total) by mouth every evening. 08/07/15   Peter M Martinique, MD  amoxicillin (AMOXIL) 500 MG capsule Take 4 capsules by mouth 1 day or 1 dose. 1 hour before dental appt. 03/22/15   Historical Provider, MD  aspirin 325 MG EC tablet Take 325 mg by mouth every evening.     Historical Provider, MD  beclomethasone (BECONASE AQ) 42 MCG/SPRAY nasal spray Place 2 sprays into both nostrils 1 day or 1 dose. Dose is for each nostril. Patient taking differently: Place 2 sprays into both nostrils daily as needed for allergies. Dose is for each nostril.(seasonal) April in to June 11/14/13   [name], MD  calcium carbonate (TUMS - DOSED IN MG ELEMENTAL CALCIUM) 500 MG chewable tablet Chew 1 tablet by mouth as needed for indigestion or heartburn.    Historical Provider, MD  Cholecalciferol (VITAMIN D) 1000 UNITS capsule Take 1,000 Units by mouth daily.      Historical Provider, MD  CRESTOR 20 MG tablet take 1 tablet by mouth once daily 04/09/15   [name], MD  dicyclomine (BENTYL) 10 MG capsule Take 1 capsule (10 mg total) by mouth every 8 (eight) hours as needed for spasms. 07/20/15   Lori P Hvozdovic, PA-C  dutasteride (AVODART) 0.5 MG capsule Take 0.5 mg by mouth every evening.     Historical Provider, MD  famotidine (PEPCID) 20 MG tablet Take 20 mg by mouth daily as needed for heartburn or indigestion.    Historical Provider, MD  gabapentin (NEURONTIN) 300 MG capsule Take 300 mg by mouth 4 (four) times daily.    Historical Provider, MD  glycopyrrolate (ROBINUL) 1 MG tablet Take 1 mg by mouth 3 (three) times daily.    Historical Provider, MD  Polyethyl Glycol-Propyl Glycol (SYSTANE) 0.4-0.3 % SOLN Place 1 drop into both eyes as needed (dry eye).    Historical  Provider, MD  Tamsulosin HCl (FLOMAX) 0.4 MG CAPS Take 0.4 mg by mouth every evening.     Historical Provider, MD  valsartan-hydrochlorothiazide (DIOVAN-HCT) 320-12.5 MG per tablet take 1 tablet by mouth every morning 12/11/14   Sherren Mocha, MD     Physical Exam: There were no vitals filed for this visit.   General: Awake alert oriented 3. No acute distress HEENT: Normocephalic and Atraumatic, Mucous membranes pink                PERRLA; EOM intact; No scleral icterus,                 Nares: Patent, Oropharynx: Clear, Fair Dentition                 Neck: FROM,  no cervical lymphadenopathy, thyromegaly, carotid bruit or JVD;  Breasts: deferred CHEST WALL: No tenderness  CHEST: Normal respiration, clear to auscultation bilaterally  HEART: Regular rate and rhythm; no murmurs rubs or gallops  BACK: No kyphosis or scoliosis; no CVA tenderness  GI: Positive Bowel Sounds, soft, mildly tender in epigastrium; no masses, no organomegaly Rectal Exam: deferred MSK: No cyanosis, clubbing, or edema Genitalia: not examined  SKIN:  no rash or ulceration  CNS: Alert and Oriented x 4, Nonfocal exam, CN 2-12 intact  Labs on Admission:  Basic Metabolic Panel:  Recent Labs Lab 09/19/15 1516  NA 139  K 3.3*  CL 102  CO2 28  GLUCOSE 117*  BUN 17  CREATININE 1.12  CALCIUM 8.7   Liver Function Tests:  Recent Labs Lab 09/19/15 1516  AST 343*  ALT 241*  ALKPHOS 456*  BILITOT 1.5*  PROT 6.9  ALBUMIN 3.7    Recent Labs Lab 09/19/15 1516  LIPASE 16.0   No results for input(s): AMMONIA in the last 168 hours. CBC:  Recent Labs Lab 09/19/15 1516  WBC 10.2  NEUTROABS 8.9*  HGB 13.6  HCT 41.3  MCV 88.8  PLT 187.0   Cardiac Enzymes: No results for input(s): CKTOTAL, CKMB, CKMBINDEX, TROPONINI in the last 168 hours.  BNP (last 3 results) No results for input(s): BNP in the last 8760 hours.  ProBNP (last 3 results) No results for input(s): PROBNP in the last 8760  hours.  CBG: No results for input(s): GLUCAP in the last 168 hours.  Radiological Exams on Admission: No results found.     Assessment/Plan Principal Problem:   Transaminitis/epigastric abdominal pain with "shakes" - AST 343, ALT 241, total bili 1.5, alkaline phosphatase 456 -We'll order an MRCP which will be performed tomorrow morning- I have also requested a GI consult -Have ordered a low-fat diet for now and when necessary Zofran -As he has not had any fever and does not have an elevated WBC count, I will hold off on antibiotics for now -Will be checking his temperature. I have told the nurse to page the on-call doctor if he is to become febrile -Repeat LFTs tomorrow morning -Hold Statin for now  Active Problems: Mild hypokalemia -Replace    Hypotension with h/o HTN  - have ordered Norvasc and ARB with holding parameters    S/P mitral valve repair  BPH - resume home meds  Consulted: [facility]- Dr Fuller Plan  Code Status: Full code  Family Communication:   DVT Prophylaxis: Heparin  Time spent: 54 min  [place], MD [facility]  If 7PM-7AM, please contact night-coverage [email or web address] 09/19/2015, 5:51 PM

## 2015-09-19 NOTE — Progress Notes (Signed)
Patient ID: [name] [name], male   DOB: [birth year], 82 y.o.   MRN: [id number]   Subjective:    Patient ID: [name] [name], male    DOB: 1929/10/16, 81 y.o.   MRN: [id number]  HPI [name] [name]  is a pleasant 81 year old white male warmer patient of Dr. Deatra [name], now established with Dr. Silvano [name] with history of remote cholecystectomy,, gallstone pancreatitis and choledocholithiasis. He underwent ERCP with sphincterotomy and removal of multiple large common bile duct stones on 02/22/2013. [name] Patient also has history of diverticulosis, coronary artery disease status post CABG, status post mitral valve repair, hypertension, PACs, referral neuropathy and spinal stenosis. He had been seen in our office in early December 2016 several weeks after he had had an episode of epigastric pain and nausea. At that time labs were unremarkable and upper abdominal ultrasound showed a common bile duct of 13 mm which was stable for him and no definite stones seen, there is found to have a 4.4 x 3.2 x 2.7 cm cyst in the left lobe of the liver and many large right renal cyst stable in size but measuring 11.5 cm. Patient says he did well until last evening when he had another episode of what he describes as severe "10 out of 10" upper abdominal pain which was nonradiating and lasted for an hour to an hour and a half. This was associated with "shaking all over". He did not have a documented fever at that time, took some Tylenol and then went to bed. He says he was uncomfortable most of the night but did not have severe pain. This morning to eating some toast for breakfast he did developed another episode of severe epigastric pain and a shaking chill. He also became nauseated and vomited. Since then his pain is "about a 5" no further shaking chills and has  Kept Some liquids down. He complains of feeling weak and puny. He did not take his [name] pressure medication today because he was out. Vitals are stable in our office the  [name] pressure lower than usual for him at 100/68 pulse is 70. Patient says this pain is the exact same pain he had with episodes of common bile duct stones in the past.  Review of Systems Pertinent positive and negative review of systems were noted in the above HPI section.  All other review of systems was otherwise negative.  Outpatient Encounter Prescriptions as of 09/19/2015  Medication Sig  . acetaminophen (TYLENOL) 500 MG tablet Take 1,000 mg by mouth every 6 (six) hours as needed for mild pain.  Marland Kitchen amLODipine (NORVASC) 5 MG tablet Take 1 tablet (5 mg total) by mouth every evening.  Marland Kitchen amoxicillin (AMOXIL) 500 MG capsule Take 4 capsules by mouth 1 day or 1 dose. 1 hour before dental appt.  Marland Kitchen aspirin 325 MG EC tablet Take 325 mg by mouth every evening.   . beclomethasone (BECONASE AQ) 42 MCG/SPRAY nasal spray Place 2 sprays into both nostrils 1 day or 1 dose. Dose is for each nostril. (Patient taking differently: Place 2 sprays into both nostrils daily as needed for allergies. Dose is for each nostril.(seasonal) April in to June)  . calcium carbonate (TUMS - DOSED IN MG ELEMENTAL CALCIUM) 500 MG chewable tablet Chew 1 tablet by mouth as needed for indigestion or heartburn.  . Cholecalciferol (VITAMIN D) 1000 UNITS capsule Take 1,000 Units by mouth daily.    . CRESTOR 20 MG tablet take 1 tablet by mouth once daily  . dicyclomine (  BENTYL) 10 MG capsule Take 1 capsule (10 mg total) by mouth every 8 (eight) hours as needed for spasms.  Marland Kitchen dutasteride (AVODART) 0.5 MG capsule Take 0.5 mg by mouth every evening.   . famotidine (PEPCID) 20 MG tablet Take 20 mg by mouth daily as needed for heartburn or indigestion.  . gabapentin (NEURONTIN) 300 MG capsule Take 300 mg by mouth 4 (four) times daily.  Marland Kitchen glycopyrrolate (ROBINUL) 1 MG tablet Take 1 mg by mouth 3 (three) times daily.  [name] [name] Glycol-Propyl Glycol (SYSTANE) 0.4-0.3 % SOLN Place 1 drop into both eyes as needed (dry eye).  . Tamsulosin HCl  (FLOMAX) 0.4 MG CAPS Take 0.4 mg by mouth every evening.   . valsartan-hydrochlorothiazide (DIOVAN-HCT) 320-12.5 MG per tablet take 1 tablet by mouth every morning  . [DISCONTINUED] hyoscyamine (LEVSIN SL) 0.125 MG SL tablet One po every 8 hrs as needed for cramps   No facility-administered encounter medications on file as of 09/19/2015.   No Known Allergies Patient Active Problem List   Diagnosis Date Noted  . Bloating 12/14/2014  . Upper abdominal pain 12/14/2014  . Peripheral neuropathy ([place]) 10/13/2014  . Spinal stenosis of lumbar region 10/13/2014  . Wandering (atrial) pacemaker 11/22/2013  . Calculus of bile duct without mention of cholecystitis or obstruction 02/22/2013  . PAC (premature atrial contraction) 11/17/2012  . Arthritis 09/30/2011  . Left obturator hernia 05/21/2011  . Right femoral hernia 05/21/2011  . Left inguinal hernia 04/10/2011  . DOE (dyspnea on exertion) 11/06/2010  . PVC's (premature ventricular contractions) 11/06/2010  . S/P mitral valve repair 11/06/2010  . Fatigue 11/06/2010  . Cough 11/06/2010  . CAD (coronary artery disease)   . HTN (hypertension)   . Hyperlipidemia    Social History   Social History  . Marital Status: Married    Spouse Name: N/A  . Number of Children: 2  . Years of Education: N/A   Occupational History  . Retired     Scientist, research (physical sciences)  .     Social History Main Topics  . Smoking status: Never Smoker   . Smokeless tobacco: Never Used  . Alcohol Use: 0.0 oz/week    1-2 Glasses of wine per week     Comment: rare alcohol use  . Drug Use: No  . Sexual Activity: Not on file   Other Topics Concern  . Not on file   Social History Narrative    [name] family history includes Stroke in his father and mother.      Objective:    Filed Vitals:   09/19/15 1430  BP: 100/68  Pulse: 68  Temp: 98.5 F (36.9 C)    Physical Exam   well-developed elderly white male in no acute distress, accompanied by family member  [name] pressure 100/68 pulse 68 temp 98 5 height 6 foot 2 weight 189. HEENT; nontraumatic normocephalic EOMI PERRLA sclera anicteric, Cardiovascular; irregular rate and rhythm with Y6-T0 soft systolic murmur, Pulmonary ;clear bilaterally, Abdomen; soft bowel sounds are present he is tender in the epigastrium there is no guarding or rebound no palpable mass or hepatosplenomegaly, rectal exam not done, Extremities ;no clubbing cyanosis or edema skin warm and dry, Neuropsych; mood and affect appropriate     Assessment & Plan:   #1 82 yo WM with hx of choledocholithiasis , s/p ERCP,sphincterotomy and extraction of multiple large CBD stones 02/2013  With 2 episodes of severe epigastric pain and shaking chills within the past 24 hours.  Sxs are concerning  for recurrent CBD stones and Cholangitis #2 s/p cholecystectomy #3 CAD-s/p CABG #4 s/p MV repair #4 HTN  #5 hx PAC's #6 spinal stenosis  #7 diverticulosis  Plan; Admit to [facility] to Hospitaist service and GI will see in consult   I spoke with Dr Maryland Pink who accepted pt  Start labs have been drawn at our office and are pending- will need [name] cultures done on admit Would start IV Zosyn Clear liquids  pain control and antiemetics  If labs are not convincing of CBD stones then may need MRCP tomorrow, otherwise will plan for ERCP with Dr Carlean Purl on Friday.   Maejor Erven S Knight [place] PA-C 09/19/2015   Cc: [name], MD

## 2015-09-19 NOTE — Patient Instructions (Signed)
Please go to [facility] to the admission desk. You will be in room 1506, 5th floor, 5 East.  The admitting MD is Dr. Bonnielee Haff.

## 2015-09-20 ENCOUNTER — Encounter (HOSPITAL_COMMUNITY): Payer: Self-pay | Admitting: *Deleted

## 2015-09-20 ENCOUNTER — Observation Stay (HOSPITAL_COMMUNITY): Payer: Medicare Other

## 2015-09-20 DIAGNOSIS — K838 Other specified diseases of biliary tract: Secondary | ICD-10-CM | POA: Diagnosis not present

## 2015-09-20 DIAGNOSIS — Z79899 Other long term (current) drug therapy: Secondary | ICD-10-CM | POA: Diagnosis not present

## 2015-09-20 DIAGNOSIS — E876 Hypokalemia: Secondary | ICD-10-CM | POA: Diagnosis not present

## 2015-09-20 DIAGNOSIS — M1990 Unspecified osteoarthritis, unspecified site: Secondary | ICD-10-CM | POA: Diagnosis not present

## 2015-09-20 DIAGNOSIS — Z951 Presence of aortocoronary bypass graft: Secondary | ICD-10-CM | POA: Diagnosis not present

## 2015-09-20 DIAGNOSIS — Z7982 Long term (current) use of aspirin: Secondary | ICD-10-CM | POA: Diagnosis not present

## 2015-09-20 DIAGNOSIS — R740 Nonspecific elevation of levels of transaminase and lactic acid dehydrogenase [LDH]: Secondary | ICD-10-CM

## 2015-09-20 DIAGNOSIS — R1013 Epigastric pain: Secondary | ICD-10-CM | POA: Diagnosis not present

## 2015-09-20 DIAGNOSIS — E785 Hyperlipidemia, unspecified: Secondary | ICD-10-CM | POA: Diagnosis not present

## 2015-09-20 DIAGNOSIS — I959 Hypotension, unspecified: Secondary | ICD-10-CM | POA: Diagnosis not present

## 2015-09-20 DIAGNOSIS — I10 Essential (primary) hypertension: Secondary | ICD-10-CM | POA: Diagnosis not present

## 2015-09-20 DIAGNOSIS — Z950 Presence of cardiac pacemaker: Secondary | ICD-10-CM | POA: Diagnosis not present

## 2015-09-20 DIAGNOSIS — N400 Enlarged prostate without lower urinary tract symptoms: Secondary | ICD-10-CM | POA: Diagnosis not present

## 2015-09-20 DIAGNOSIS — I2510 Atherosclerotic heart disease of native coronary artery without angina pectoris: Secondary | ICD-10-CM | POA: Diagnosis not present

## 2015-09-20 DIAGNOSIS — I9589 Other hypotension: Secondary | ICD-10-CM | POA: Diagnosis not present

## 2015-09-20 DIAGNOSIS — R935 Abnormal findings on diagnostic imaging of other abdominal regions, including retroperitoneum: Secondary | ICD-10-CM | POA: Diagnosis not present

## 2015-09-20 DIAGNOSIS — Z9049 Acquired absence of other specified parts of digestive tract: Secondary | ICD-10-CM | POA: Diagnosis not present

## 2015-09-20 LAB — CBC
HCT: 37.7 % — ABNORMAL LOW (ref 39.0–52.0)
HEMOGLOBIN: 12.2 g/dL — AB (ref 13.0–17.0)
MCH: 29.8 pg (ref 26.0–34.0)
MCHC: 32.4 g/dL (ref 30.0–36.0)
MCV: 92 fL (ref 78.0–100.0)
Platelets: 154 10*3/uL (ref 150–400)
RBC: 4.1 MIL/uL — ABNORMAL LOW (ref 4.22–5.81)
RDW: 13.7 % (ref 11.5–15.5)
WBC: 6.5 10*3/uL (ref 4.0–10.5)

## 2015-09-20 LAB — COMPREHENSIVE METABOLIC PANEL
ALBUMIN: 3.2 g/dL — AB (ref 3.5–5.0)
ALK PHOS: 322 U/L — AB (ref 38–126)
ALT: 157 U/L — AB (ref 17–63)
AST: 157 U/L — AB (ref 15–41)
Anion gap: 7 (ref 5–15)
BILIRUBIN TOTAL: 1.6 mg/dL — AB (ref 0.3–1.2)
BUN: 13 mg/dL (ref 6–20)
CALCIUM: 7.8 mg/dL — AB (ref 8.9–10.3)
CO2: 26 mmol/L (ref 22–32)
Chloride: 106 mmol/L (ref 101–111)
Creatinine, Ser: 0.86 mg/dL (ref 0.61–1.24)
GFR calc Af Amer: >60 mL/min (ref >60)
GFR calc non Af Amer: >60 mL/min (ref >60)
GLUCOSE: 102 mg/dL — AB (ref 65–99)
Potassium: 3.4 mmol/L — ABNORMAL LOW (ref 3.5–5.1)
SODIUM: 139 mmol/L (ref 135–145)
TOTAL PROTEIN: 5.9 g/dL — AB (ref 6.5–8.1)

## 2015-09-20 LAB — PROTIME-INR
INR: 1.22 (ref 0.00–1.49)
PROTHROMBIN TIME: 15.1 s (ref 11.6–15.2)

## 2015-09-20 MED ORDER — POTASSIUM CHLORIDE CRYS ER 20 MEQ PO TBCR: 40.0000 meq | EXTENDED_RELEASE_TABLET | Freq: Once | ORAL | Status: DC

## 2015-09-20 MED ORDER — CIPROFLOXACIN IN D5W 400 MG/200ML IV SOLN
400.0000 mg | Freq: Once | INTRAVENOUS | Status: AC
  Administered 2015-09-21: 400 mg via INTRAVENOUS
  Filled 2015-09-20: qty 200

## 2015-09-20 NOTE — Progress Notes (Signed)
   MRCP does not show stones but clinical scenario very compatible with biliary sxs so I am in favor of going ahead with an ERCP and hopefully cholangioscopy tomorrow. Could have soft stones, sludge, debris, etc.  [name], MD, [facility] [phone] (pager) [phone] after 5 PM, weekends and holidays  09/20/2015 12:38 PM

## 2015-09-20 NOTE — Progress Notes (Addendum)
[facility] Progress Note   [name] [name]  ZOX:[id number]  DOB: [birth year]  DOA: 09/19/2015 PCP: [name] Haste, MD  Brief narrative: [name] [name] is a 82 y.o. male with past medical history of coronary artery disease status post CABG, mitral valve repair, hypertension, BPH, hyperlipidemia who presents to the hospital from the [facility] GI office as a direct admit for epigastric pain and "shakes" along with elevated LFTs suspected to be from cholangitis.    Subjective: No further episodes of pain over night. No nausea or fevers  Assessment/Plan: Principal Problem:   Transaminitis/  Abdominal pain/ shakes - MRCP unrevealing- will have ERCP tomorrow- GI is following - he has a h/o cholangitis about 2 yrs ago- he has had a cholecystectomy > 10 yrs ago  Active Problems: Mild hypokalemia -Replace again today   Hypotension with h/o HTN - hypotension resolved today  - have ordered Norvasc and ARB     S/P mitral valve repair  BPH - resumed home meds     Antibiotics: Anti-infectives    Start     Dose/Rate Route Frequency Ordered Stop   09/21/15 0800  ciprofloxacin (CIPRO) IVPB 400 mg     400 mg 200 mL/hr over 60 Minutes Intravenous  Once 09/20/15 0949       Code Status:     Code Status Orders        Start     Ordered   09/19/15 1757  Full code   Continuous     09/19/15 1757    Code Status History    Date Active Date Inactive Code Status Order ID Comments User Context   This patient has a current code status but no historical code status.     Family Communication:  Disposition Plan:  ERCP tomorrow DVT prophylaxis: Heparin Consultants: GI Procedures: MRCP    Objective: Filed Weights   09/19/15 1800  Weight: 86 kg (189 lb 9.5 oz)    Intake/Output Summary (Last 24 hours) at 09/20/15 1437 Last data filed at 09/20/15 0551  Gross per 24 hour  Intake      0 ml  Output    600 ml  Net   -600 ml     Vitals Filed Vitals:   09/19/15 1814  09/19/15 2034 09/20/15 0520 09/20/15 1358  BP: 155/75 170/69 144/57 165/69  Pulse: 58 62 111 98  Temp: 97.8 F (36.6 C) 97.5 F (36.4 C) 98.1 F (36.7 C) 97.6 F (36.4 C)  TempSrc: Oral Oral Oral Oral  Resp:  20 20 20   Height:      Weight:      SpO2: 98% 94% 95% 96%    Exam:  General:  Pt is alert, not in acute distress  HEENT: No icterus, No thrush, oral mucosa moist  Cardiovascular: regular rate and rhythm, S1/S2 No murmur  Respiratory: clear to auscultation bilaterally   Abdomen: Soft, +Bowel sounds, non tender, non distended, no guarding  MSK: No cyanosis or clubbing- no pedal edema   Data Reviewed: Basic Metabolic Panel:  Recent Labs Lab 09/19/15 1516 09/19/15 2104 09/20/15 0544  NA 139  --  139  K 3.3*  --  3.4*  CL 102  --  106  CO2 28  --  26  GLUCOSE 117*  --  102*  BUN 17  --  13  CREATININE 1.12 0.97 0.86  CALCIUM 8.7  --  7.8*   Liver Function Tests:  Recent Labs Lab 09/19/15 1516 09/20/15 0544  AST 343* 157*  ALT 241* 157*  ALKPHOS 456* 322*  BILITOT 1.5* 1.6*  PROT 6.9 5.9*  ALBUMIN 3.7 3.2*    Recent Labs Lab 09/19/15 1516  LIPASE 16.0   No results for input(s): AMMONIA in the last 168 hours. CBC:  Recent Labs Lab 09/19/15 1516 09/19/15 2104 09/20/15 0544  WBC 10.2 6.3 6.5  NEUTROABS 8.9*  --   --   HGB 13.6 12.3* 12.2*  HCT 41.3 37.8* 37.7*  MCV 88.8 92.0 92.0  PLT 187.0 166 154   Cardiac Enzymes: No results for input(s): CKTOTAL, CKMB, CKMBINDEX, TROPONINI in the last 168 hours. BNP (last 3 results) No results for input(s): BNP in the last 8760 hours.  ProBNP (last 3 results) No results for input(s): PROBNP in the last 8760 hours.  CBG: No results for input(s): GLUCAP in the last 168 hours.  No results found for this or any previous visit (from the past 240 hour(s)).   Studies: Mr Abdomen Mrcp Wo Cm  09/20/2015  CLINICAL DATA:  82 year old male with history of abdominal and epigastric pain. Prior history  of ERCP and stone removal 2 years ago. ERCP pending for tomorrow. Evaluate for potential common bile duct obstruction. EXAM: MRI ABDOMEN WITHOUT CONTRAST  (INCLUDING MRCP) TECHNIQUE: Multiplanar multisequence MR imaging of the abdomen was performed. Heavily T2-weighted images of the biliary and pancreatic ducts were obtained, and three-dimensional MRCP images were rendered by post processing. COMPARISON:  MRI of the abdomen 10/18/2013. CT of the abdomen and pelvis 08/16/2014. FINDINGS: Lower chest: Cardiomegaly. Susceptibility artifact in the lower sternum related to median sternotomy wires. Hepatobiliary: Multilocular cystic lesion in the left lobe of the liver between segments 2 and 3 measuring approximately 3.8 x 2.7 cm demonstrating low T1 signal intensity, high T2 signal intensity and multiple thin internal septations. Postcontrast images were not obtained. This lesion is very similar in size in appearance to prior study 10/18/2013, strongly favored to be benign. No other suspicious hepatic lesions are identified on today's noncontrast examination. MRCP images demonstrate moderate intra and extrahepatic biliary ductal dilatation which is very similar to the prior examination. Common bile duct measures up to 13 mm in the porta hepatis which is unchanged. No definite filling defect within the common bile duct to suggest choledocholithiasis. Status post cholecystectomy. Pancreas: No definite pancreatic mass identified on today's noncontrast examination. Pancreatic duct is upper limits of normal measuring 3 mm on MRCP images. No pancreatic or peripancreatic fluid or inflammatory changes. Spleen: Unremarkable. Adrenals/Urinary Tract: Associated with both kidneys there are numerous lesions which are low T1 and high T2 signal intensity. These are incompletely characterized on today's noncontrast examination, but are presumably multiple cysts, the largest of which is exophytic measuring up to 9.3 cm extending off the  upper pole of the right kidney. No hydroureteronephrosis. Bilateral adrenal glands are normal in appearance. Stomach/Bowel: Visualized portions are unremarkable. Vascular/Lymphatic: No aneurysm identified in the visualized abdominal vasculature on today's noncontrast examination. No definite lymphadenopathy noted in the abdomen. Other: No significant volume of ascites in the visualized peritoneal cavity. Musculoskeletal: No aggressive osseous lesions are noted in the visualized portions of the skeleton. IMPRESSION: 1. Today's study appears very similar to prior examination from 10/19/2014. There is chronic intra and extrahepatic biliary ductal dilatation which appears to be benign likely related to post cholecystectomy physiology and age. No choledocholithiasis or obstructing lesion identified on today's examination. 2. Additional incidental findings, similar to the prior examination, as detailed above. Electronically Signed   By: Mauri Brooklyn.D.  On: 09/20/2015 12:17   Mr 3d Recon At Scanner  09/20/2015  CLINICAL DATA:  82 year old male with history of abdominal and epigastric pain. Prior history of ERCP and stone removal 2 years ago. ERCP pending for tomorrow. Evaluate for potential common bile duct obstruction. EXAM: MRI ABDOMEN WITHOUT CONTRAST  (INCLUDING MRCP) TECHNIQUE: Multiplanar multisequence MR imaging of the abdomen was performed. Heavily T2-weighted images of the biliary and pancreatic ducts were obtained, and three-dimensional MRCP images were rendered by post processing. COMPARISON:  MRI of the abdomen 10/18/2013. CT of the abdomen and pelvis 08/16/2014. FINDINGS: Lower chest: Cardiomegaly. Susceptibility artifact in the lower sternum related to median sternotomy wires. Hepatobiliary: Multilocular cystic lesion in the left lobe of the liver between segments 2 and 3 measuring approximately 3.8 x 2.7 cm demonstrating low T1 signal intensity, high T2 signal intensity and multiple thin internal  septations. Postcontrast images were not obtained. This lesion is very similar in size in appearance to prior study 10/18/2013, strongly favored to be benign. No other suspicious hepatic lesions are identified on today's noncontrast examination. MRCP images demonstrate moderate intra and extrahepatic biliary ductal dilatation which is very similar to the prior examination. Common bile duct measures up to 13 mm in the porta hepatis which is unchanged. No definite filling defect within the common bile duct to suggest choledocholithiasis. Status post cholecystectomy. Pancreas: No definite pancreatic mass identified on today's noncontrast examination. Pancreatic duct is upper limits of normal measuring 3 mm on MRCP images. No pancreatic or peripancreatic fluid or inflammatory changes. Spleen: Unremarkable. Adrenals/Urinary Tract: Associated with both kidneys there are numerous lesions which are low T1 and high T2 signal intensity. These are incompletely characterized on today's noncontrast examination, but are presumably multiple cysts, the largest of which is exophytic measuring up to 9.3 cm extending off the upper pole of the right kidney. No hydroureteronephrosis. Bilateral adrenal glands are normal in appearance. Stomach/Bowel: Visualized portions are unremarkable. Vascular/Lymphatic: No aneurysm identified in the visualized abdominal vasculature on today's noncontrast examination. No definite lymphadenopathy noted in the abdomen. Other: No significant volume of ascites in the visualized peritoneal cavity. Musculoskeletal: No aggressive osseous lesions are noted in the visualized portions of the skeleton. IMPRESSION: 1. Today's study appears very similar to prior examination from 10/19/2014. There is chronic intra and extrahepatic biliary ductal dilatation which appears to be benign likely related to post cholecystectomy physiology and age. No choledocholithiasis or obstructing lesion identified on today's  examination. 2. Additional incidental findings, similar to the prior examination, as detailed above. Electronically Signed   By: Vinnie Langton M.D.   On: 09/20/2015 12:17    Scheduled Meds:  Scheduled Meds: . amLODipine  5 mg Oral QPM  . [START ON 09/21/2015] ciprofloxacin  400 mg Intravenous Once  . dutasteride  0.5 mg Oral QPM  . gabapentin  300 mg Oral QID  . heparin  5,000 Units Subcutaneous 3 times per day  . irbesartan  300 mg Oral Daily  . potassium chloride  40 mEq Oral Once   Continuous Infusions: . sodium chloride 125 mL/hr at 09/20/15 1218    Time spent on care of this patient: 30 min   [place], MD 09/20/2015, 2:37 PM  LOS: 1 day   [facility] Office  [phone] Pager - Text Page per [email or web address] If 7PM-7AM, please contact night-coverage [email or web address]

## 2015-09-20 NOTE — Consult Note (Signed)
Referring Provider: [facility] Primary Care Physician:  Wyatt Haste, MD Primary Gastroenterologist:  Dr. Carlean Purl  Reason for Consultation:   transaminitis    HPI: [name] [name] is a 82 y.o. male who was formerly a patient of Dr. Deatra Ina. He has a hx of a remote cholecystectomy, gallstone pancreatitis and choledocholithiasis. He underwent ERCP with sphincterotomy and removal of multiple large common bile duct stones on 02/22/2013. He also has a history of diverticulosis, coronary artery disease status post CABG, status post mitral valve repair, hypertension, PACs, peripheral neuropathy, and spinal stenosis. In November he experienced an episode of epigastric pain and nausea. He was seen in our office several weeks later in early December and at that time labs were unremarkable and upper abdominal ultrasound showed a common bile duct of 13 mm which was stable for him. No definite stones were visualized. He was noted to have a cyst in the left lobe of the liver and a large right renal cyst. Patient states he was feeling fairly well until February 7 when he has another episode of epigastric pain that he described as a "10 out of 10" in severity. The pain was in the epigastric area and did not radiate. It lasted for about an hour and a half. It was associated with "shaking all over". He did not take his temperature at the time but rather took some Tylenol and went to bed. He had discomfort most of the night. Yesterday morning while eating breakfast he developed another episode of severe epigastric pain and shaking chills associated with nausea and dry heaves. Since then his pain is about a 5 and he has had no shaking chills. He was evaluated in the office yesterday and sent to the hospital as a direct admit to evaluate for cholangitis. Laboratory studies revealed a total bili 1.5, alkaline phosphatase 456, AST 343, ALT 241. Lipase was 16. WBC was 10.2. Patient has been scheduled for MRCP later  this morning.   Past Medical History  Diagnosis Date  . MVP (mitral valve prolapse)     s/p Mitral Valve Repair  . CAD (coronary artery disease)     s/p CABG x 1  . HTN (hypertension)   . Hyperlipidemia   . DJD (degenerative joint disease)   . BPH (benign prostatic hyperplasia)   . Arthritis   . Lumbar disc disease   . Pancreatitis   . Gallstones   . Wandering (atrial) pacemaker 11/22/2013    with frequent multifocal ectopy (PACs, PVCs, junctional escape beats    Past Surgical History  Procedure Laterality Date  . Mitral valve repair  January 2002  . Coronary artery bypass graft  January 2002    Single bypass to the distal RCA  . Colonoscopy  ~2008    no polyps per pt.  Dr. [name]  . Cholecystectomy      Dr. Lennie Hummer  . Upper gastrointestinal endoscopy  02/22/13  . Ercp N/A 02/22/2013    Procedure: ENDOSCOPIC RETROGRADE CHOLANGIOPANCREATOGRAPHY (ERCP);  Surgeon: [name], MD;  Location: [facility];  Service: Endoscopy;  Laterality: N/A;  . Spyglass lithotripsy N/A 02/22/2013    Procedure: AFBXUXYB LITHOTRIPSY;  Surgeon: [name], MD;  Location: [facility];  Service: Endoscopy;  Laterality: N/A;  litho ord'd 7/8 by DL/PO [phone] [facility]  . Spyglass cholangioscopy N/A 02/22/2013    Procedure: TYOMAYOK CHOLANGIOSCOPY;  Surgeon: [name], MD;  Location: [facility];  Service: Endoscopy;  Laterality: N/A;  . Hernia  repair  05/05/11    Lap L inguinal & obturator herniae, R Femoral Hernia    Prior to Admission medications   Medication Sig Start Date End Date Taking? Authorizing Provider  acetaminophen (TYLENOL) 500 MG tablet Take 1,000 mg by mouth every 6 (six) hours as needed for mild pain.    Historical Provider, MD  amLODipine (NORVASC) 5 MG tablet Take 1 tablet (5 mg total) by mouth every evening. 08/07/15   Peter M Martinique, MD  amoxicillin (AMOXIL) 500 MG capsule Take 4 capsules by mouth 1 day or 1 dose. 1 hour before dental appt. 03/22/15    Historical Provider, MD  aspirin 325 MG EC tablet Take 325 mg by mouth every evening.     Historical Provider, MD  beclomethasone (BECONASE AQ) 42 MCG/SPRAY nasal spray Place 2 sprays into both nostrils 1 day or 1 dose. Dose is for each nostril. Patient taking differently: Place 2 sprays into both nostrils daily as needed for allergies. Dose is for each nostril.(seasonal) April in to June 11/14/13   [name], MD  calcium carbonate (TUMS - DOSED IN MG ELEMENTAL CALCIUM) 500 MG chewable tablet Chew 1 tablet by mouth as needed for indigestion or heartburn.    Historical Provider, MD  Cholecalciferol (VITAMIN D) 1000 UNITS capsule Take 1,000 Units by mouth daily.      Historical Provider, MD  CRESTOR 20 MG tablet take 1 tablet by mouth once daily 04/09/15   [name], MD  dicyclomine (BENTYL) 10 MG capsule Take 1 capsule (10 mg total) by mouth every 8 (eight) hours as needed for spasms. 07/20/15   Lori P Hvozdovic, PA-C  dutasteride (AVODART) 0.5 MG capsule Take 0.5 mg by mouth every evening.     Historical Provider, MD  famotidine (PEPCID) 20 MG tablet Take 20 mg by mouth daily as needed for heartburn or indigestion.    Historical Provider, MD  gabapentin (NEURONTIN) 300 MG capsule Take 300 mg by mouth 4 (four) times daily.    Historical Provider, MD  glycopyrrolate (ROBINUL) 1 MG tablet Take 1 mg by mouth 3 (three) times daily.    Historical Provider, MD  Polyethyl Glycol-Propyl Glycol (SYSTANE) 0.4-0.3 % SOLN Place 1 drop into both eyes as needed (dry eye).    Historical Provider, MD  Tamsulosin HCl (FLOMAX) 0.4 MG CAPS Take 0.4 mg by mouth every evening.     Historical Provider, MD  valsartan-hydrochlorothiazide (DIOVAN-HCT) 320-12.5 MG per tablet take 1 tablet by mouth every morning 12/11/14   Sherren Mocha, MD    Current Facility-Administered Medications  Medication Dose Route Frequency Provider Last Rate Last Dose  . 0.9 %  sodium chloride infusion   Intravenous Continuous Debbe Odea,  MD 125 mL/hr at 09/19/15 1826    . acetaminophen (TYLENOL) tablet 650 mg  650 mg Oral Q6H PRN Debbe Odea, MD       Or  . acetaminophen (TYLENOL) suppository 650 mg  650 mg Rectal Q6H PRN Debbe Odea, MD      . amLODipine (NORVASC) tablet 5 mg  5 mg Oral QPM Debbe Odea, MD   5 mg at 09/19/15 2029  . dicyclomine (BENTYL) capsule 10 mg  10 mg Oral Q8H PRN Debbe Odea, MD      . dutasteride (AVODART) capsule 0.5 mg  0.5 mg Oral QPM Debbe Odea, MD   0.5 mg at 09/19/15 2028  . famotidine (PEPCID) tablet 20 mg  20 mg Oral Daily PRN Debbe Odea, MD      .  gabapentin (NEURONTIN) capsule 300 mg  300 mg Oral QID Debbe Odea, MD   300 mg at 09/19/15 2028  . heparin injection 5,000 Units  5,000 Units Subcutaneous 3 times per day Debbe Odea, MD   5,000 Units at 09/20/15 0600  . HYDROcodone-acetaminophen (NORCO/VICODIN) 5-325 MG per tablet 1-2 tablet  1-2 tablet Oral Q4H PRN Debbe Odea, MD      . irbesartan (AVAPRO) tablet 300 mg  300 mg Oral Daily Debbe Odea, MD   300 mg at 09/19/15 2026  . morphine 2 MG/ML injection 2 mg  2 mg Intravenous Q4H PRN Debbe Odea, MD      . ondansetron (ZOFRAN) tablet 4 mg  4 mg Oral Q6H PRN Debbe Odea, MD       Or  . ondansetron (ZOFRAN) injection 4 mg  4 mg Intravenous Q6H PRN Saima Rizwan, MD      . potassium chloride SA (K-DUR,KLOR-CON) CR tablet 40 mEq  40 mEq Oral Once Debbe Odea, MD        Allergies as of 09/19/2015  . (No Known Allergies)    Family History  Problem Relation Age of Onset  . Stroke Father   . Stroke Mother     TIA    Social History   Social History  . Marital Status: Married    Spouse Name: N/A  . Number of Children: 2  . Years of Education: N/A   Occupational History  . Retired     Scientist, research (physical sciences)  .     Social History Main Topics  . Smoking status: Never Smoker   . Smokeless tobacco: Never Used  . Alcohol Use: 0.0 oz/week    1-2 Glasses of wine per week     Comment: rare alcohol use  . Drug Use: No  .  Sexual Activity: Not on file   Other Topics Concern  . Not on file   Social History Narrative    Review of Systems: Per history of present illness, otherwise negative.  Physical Exam: Vital signs in last 24 hours: Temp:  [97.5 F (36.4 C)-98.5 F (36.9 C)] 98.1 F (36.7 C) (02/09 0520) Pulse Rate:  [58-111] 111 (02/09 0520) Resp:  [20] 20 (02/09 0520) BP: (100-170)/(57-75) 144/57 mmHg (02/09 0520) SpO2:  [94 %-98 %] 95 % (02/09 0520) Weight:  [189 lb 9.5 oz (86 kg)-189 lb 9.6 oz (86.002 kg)] 189 lb 9.5 oz (86 kg) (02/08 1800)   General:   Alert,  Well-developed, well-nourished, pleasant and cooperative in NAD Head:  Normocephalic and atraumatic. Eyes:  Sclera clear, no icterus.   Conjunctiva pink. Ears:  Normal auditory acuity. Nose:  No deformity, discharge,  or lesions. Mouth:  No deformity or lesions.   Neck:  Supple; no masses or thyromegaly. Lungs:  Clear throughout to auscultation.   No wheezes, crackles, or rhonchi.  Heart:  Regular rate and rhythm; 1/6 systolic murmur Abdomen:  Soft, mild epigastric tenderness to palpation with no rebound or guarding, BS active,nonpalp mass or hsm.   Rectal:  Deferred  Msk:  Symmetrical without gross deformities. . Pulses:  Normal pulses noted. Extremities:  Without clubbing or edema. Neurologic:  Alert and  oriented x4;  grossly normal neurologically. Skin: Intact without significant lesions or rashes.. Psych: Alert and cooperative. Normal mood and affect.  Intake/Output from previous day: 02/08 0701 - 02/09 0700 In: -  Out: 600 [Urine:600] Intake/Output this shift:    Lab Results:  Recent Labs  09/19/15 1516 09/19/15 2104 09/20/15 0544  WBC 10.2 6.3 6.5  HGB 13.6 12.3* 12.2*  HCT 41.3 37.8* 37.7*  PLT 187.0 166 154   BMET  Recent Labs  09/19/15 1516 09/19/15 2104 09/20/15 0544  NA 139  --  139  K 3.3*  --  3.4*  CL 102  --  106  CO2 28  --  26  GLUCOSE 117*  --  102*  BUN 17  --  13  CREATININE 1.12  0.97 0.86  CALCIUM 8.7  --  7.8*   LFT  Recent Labs  09/20/15 0544  PROT 5.9*  ALBUMIN 3.2*  AST 157*  ALT 157*  ALKPHOS 322*  BILITOT 1.6*   PT/INR  Recent Labs  09/20/15 0544  LABPROT 15.1  INR 1.22    Studies/Results: Abdomen Complete   Status: Final result       PACS Images     Show images for US Abdomen Complete     Study Result     CLINICAL DATA: Epigastric abdominal pain. History of pancreatitis.  EXAM: ULTRASOUND ABDOMEN COMPLETE  COMPARISON: 08/16/2014 CT abdomen/pelvis.  FINDINGS: Gallbladder: Surgically absent.  Common bile duct: Diameter: 13 mm, not appreciably changed since 08/16/2014 CT study. No choledocholithiasis demonstrated in the visualized common bile duct, noting nonvisualization of the distal common bile duct due to overlying bowel gas. No intrahepatic biliary ductal dilatation.  Liver: There is a minimally complex 4.4 x 3.2 x 2.7 cm lobulated liver cyst in the lateral segment left liver lobe with thin internal septations, not appreciably changed in size or appearance compared to the 03/27/2015 CT study. No additional liver lesions. Background liver parenchymal echogenicity and echotexture are within normal limits.  IVC: No abnormality visualized.  Pancreas: Not visualized due to extensive overlying bowel gas.  Spleen: Size and appearance within normal limits.  Right Kidney: Length: 12.3 cm. Echogenicity within normal limits. No hydronephrosis. There is an exophytic 11.5 x 8.9 x 7.8 cm renal cyst in the upper right kidney with thin internal septation, not appreciably changed in size since 08/16/2014 CT study. There are several stable simple parapelvic renal cysts in the right renal sinus.  Left Kidney: Length: 13.5 cm. Echogenicity within normal limits. No hydronephrosis. There are multiple stable simple parapelvic renal cysts throughout the left renal sinus.  Abdominal aorta: No aneurysm  visualized.  Other findings: None.  IMPRESSION: 1. Status post cholecystectomy. Stable chronic common bile duct dilatation, likely due to the history of cholecystectomy. 2. Stable minimally complex benign-appearing left liver lobe cyst. No new or suspicious liver lesions. 3. Stable large minimally complex upper right renal cyst. Stable parapelvic renal cysts in both kidneys. No suspicious renal masses. No hydronephrosis.   Electronically Signed  By: Ilona Sorrel M.D.  On: 07/20/2015 12:50     IMPRESSION/PLAN:  82 year old male with history of choledocholithiasis and gallstone pancreatitis, seen in the office yesterday with severe epigastric abdominal pain, "shakes", found to have transaminitis with AST 343, ALT 241, total bili 1.5, and alkaline phosphatase 456. Labs suggestive of recurrent stones. Morning labs today show total bili 1.6, alkaline phosphatase 322, ALT 157, AST 157. Patient scheduled for MRCP later today. Patient is tentatively been scheduled for ERCP tomorrow.The risks, benefits, and possible complications of the procedure, including  But not limited to bleeding, perforation, surgery, and the 5-10% risk for pancreatitis, were explained to the patient.  It was explained that  post ERCP pancreatitis which, while usually is mild, occasionally maybe severe and even life-threatening.  Patient's questions were answered. 2 to  scheduling availability with and oh and anesthesia, his ERCP has been scheduled for 2 more role at:[facility] I have spoken to the patient's nurse, Sherlynn Stalls, who will arrange for transportation from [place] to [place] for the procedure tomorrow.    Hvozdovic, Deloris Ping 09/20/2015,  Pager [phone]  Mon-Fri 8a-5p [phone] after 5p, weekends, holidays   Attending physician's note   I have taken a history, examined the patient and reviewed the chart. I agree with the Advanced Practitioner's note, impression and recommendations.  82 year old male with history of  choledocholithiasis and gallstone pancreatitis admitted with abdominal pain and elevated bilirubin and alkaline phosphatase concerning for recurrent choledocholithiasis. MRCP be showed stable dilation of intrahepatic and CBD. No stones in the CBD. He could've potentially passed the stone or may have some soft sludge in the duct causing the blockage. Plan for ERCP tomorrow by Dr. Carlean Purl. The risks and benefits as well as alternatives of endoscopic procedure(s) have been discussed and reviewed. All questions answered. The patient agrees to proceed.   Damaris Hippo, MD [phone] Mon-Fri 8a-5p [phone] after 5p, weekends, holidays

## 2015-09-20 NOTE — Progress Notes (Signed)
Agree with Ms. Esterwood's assessment and plan. Jkwon Treptow E. Aliza Moret, MD, FACG   

## 2015-09-21 ENCOUNTER — Other Ambulatory Visit: Payer: Self-pay | Admitting: Cardiology

## 2015-09-21 ENCOUNTER — Encounter (HOSPITAL_COMMUNITY)
Admission: AD | Disposition: A | Discharge status: Home / Self Care | Payer: Self-pay | Source: Ambulatory Visit | Attending: Internal Medicine

## 2015-09-21 ENCOUNTER — Observation Stay (HOSPITAL_COMMUNITY): Payer: Medicare Other

## 2015-09-21 ENCOUNTER — Other Ambulatory Visit: Payer: Self-pay | Admitting: *Deleted

## 2015-09-21 ENCOUNTER — Observation Stay (HOSPITAL_COMMUNITY): Payer: Medicare Other | Admitting: Anesthesiology

## 2015-09-21 ENCOUNTER — Encounter (HOSPITAL_COMMUNITY): Payer: Self-pay | Admitting: *Deleted

## 2015-09-21 DIAGNOSIS — I2510 Atherosclerotic heart disease of native coronary artery without angina pectoris: Secondary | ICD-10-CM | POA: Diagnosis not present

## 2015-09-21 DIAGNOSIS — Z951 Presence of aortocoronary bypass graft: Secondary | ICD-10-CM | POA: Diagnosis not present

## 2015-09-21 DIAGNOSIS — R740 Nonspecific elevation of levels of transaminase and lactic acid dehydrogenase [LDH]: Secondary | ICD-10-CM | POA: Diagnosis not present

## 2015-09-21 DIAGNOSIS — Z9889 Other specified postprocedural states: Secondary | ICD-10-CM | POA: Diagnosis not present

## 2015-09-21 DIAGNOSIS — E876 Hypokalemia: Secondary | ICD-10-CM | POA: Diagnosis not present

## 2015-09-21 DIAGNOSIS — Z950 Presence of cardiac pacemaker: Secondary | ICD-10-CM | POA: Diagnosis not present

## 2015-09-21 DIAGNOSIS — I10 Essential (primary) hypertension: Secondary | ICD-10-CM | POA: Diagnosis not present

## 2015-09-21 DIAGNOSIS — Z79899 Other long term (current) drug therapy: Secondary | ICD-10-CM | POA: Diagnosis not present

## 2015-09-21 DIAGNOSIS — K838 Other specified diseases of biliary tract: Secondary | ICD-10-CM | POA: Diagnosis not present

## 2015-09-21 DIAGNOSIS — N400 Enlarged prostate without lower urinary tract symptoms: Secondary | ICD-10-CM | POA: Diagnosis not present

## 2015-09-21 DIAGNOSIS — Z9049 Acquired absence of other specified parts of digestive tract: Secondary | ICD-10-CM | POA: Diagnosis not present

## 2015-09-21 DIAGNOSIS — M1990 Unspecified osteoarthritis, unspecified site: Secondary | ICD-10-CM | POA: Diagnosis not present

## 2015-09-21 DIAGNOSIS — K8050 Calculus of bile duct without cholangitis or cholecystitis without obstruction: Secondary | ICD-10-CM

## 2015-09-21 DIAGNOSIS — Z7982 Long term (current) use of aspirin: Secondary | ICD-10-CM | POA: Diagnosis not present

## 2015-09-21 DIAGNOSIS — E785 Hyperlipidemia, unspecified: Secondary | ICD-10-CM | POA: Diagnosis not present

## 2015-09-21 DIAGNOSIS — I959 Hypotension, unspecified: Secondary | ICD-10-CM | POA: Diagnosis not present

## 2015-09-21 HISTORY — PX: SPYGLASS CHOLANGIOSCOPY: SHX5441

## 2015-09-21 HISTORY — PX: ERCP: SHX5425

## 2015-09-21 LAB — COMPREHENSIVE METABOLIC PANEL
ALT: 104 U/L — ABNORMAL HIGH (ref 17–63)
ANION GAP: 8 (ref 5–15)
AST: 89 U/L — ABNORMAL HIGH (ref 15–41)
Albumin: 3 g/dL — ABNORMAL LOW (ref 3.5–5.0)
Alkaline Phosphatase: 252 U/L — ABNORMAL HIGH (ref 38–126)
BUN: 14 mg/dL (ref 6–20)
CALCIUM: 7.9 mg/dL — AB (ref 8.9–10.3)
CHLORIDE: 104 mmol/L (ref 101–111)
CO2: 28 mmol/L (ref 22–32)
Creatinine, Ser: 0.97 mg/dL (ref 0.61–1.24)
Glucose, Bld: 92 mg/dL (ref 65–99)
Potassium: 3.3 mmol/L — ABNORMAL LOW (ref 3.5–5.1)
SODIUM: 140 mmol/L (ref 135–145)
Total Bilirubin: 1.3 mg/dL — ABNORMAL HIGH (ref 0.3–1.2)
Total Protein: 5.9 g/dL — ABNORMAL LOW (ref 6.5–8.1)

## 2015-09-21 LAB — MAGNESIUM: MAGNESIUM: 1.7 mg/dL (ref 1.7–2.4)

## 2015-09-21 SURGERY — ERCP, WITH INTERVENTION IF INDICATED
Anesthesia: General

## 2015-09-21 MED ORDER — VALSARTAN-HYDROCHLOROTHIAZIDE 320-12.5 MG PO TABS: 1.0000 | ORAL_TABLET | Freq: Every morning | ORAL | Status: DC

## 2015-09-21 MED ORDER — PHENYLEPHRINE HCL 10 MG/ML IJ SOLN
10.0000 mg | INTRAVENOUS | Status: DC | PRN
  Administered 2015-09-21: 50 ug/min via INTRAVENOUS

## 2015-09-21 MED ORDER — LACTATED RINGERS IV SOLN
INTRAVENOUS | Status: DC
  Administered 2015-09-21: 1000 mL via INTRAVENOUS

## 2015-09-21 MED ORDER — SUCCINYLCHOLINE CHLORIDE 20 MG/ML IJ SOLN
INTRAMUSCULAR | Status: DC | PRN
  Administered 2015-09-21: 100 mg via INTRAVENOUS

## 2015-09-21 MED ORDER — INDOMETHACIN 50 MG RE SUPP
RECTAL | Status: AC
  Filled 2015-09-21: qty 1

## 2015-09-21 MED ORDER — EPHEDRINE SULFATE 50 MG/ML IJ SOLN
INTRAMUSCULAR | Status: DC | PRN
  Administered 2015-09-21: 5 mg via INTRAVENOUS

## 2015-09-21 MED ORDER — URSODIOL 300 MG PO CAPS
300.0000 mg | ORAL_CAPSULE | Freq: Two times a day (BID) | ORAL | Status: DC
  Administered 2015-09-21 – 2015-09-22 (×2): 300 mg via ORAL
  Filled 2015-09-21 (×4): qty 1

## 2015-09-21 MED ORDER — FENTANYL CITRATE (PF) 100 MCG/2ML IJ SOLN
INTRAMUSCULAR | Status: DC | PRN
  Administered 2015-09-21: 50 ug via INTRAVENOUS

## 2015-09-21 MED ORDER — SODIUM CHLORIDE 0.9 % IV SOLN
INTRAVENOUS | Status: DC | PRN
  Administered 2015-09-21: 20 mL

## 2015-09-21 MED ORDER — FENTANYL CITRATE (PF) 100 MCG/2ML IJ SOLN: 25.0000 ug | INTRAMUSCULAR | Status: DC | PRN

## 2015-09-21 MED ORDER — LIDOCAINE HCL (CARDIAC) 20 MG/ML IV SOLN
INTRAVENOUS | Status: DC | PRN
  Administered 2015-09-21: 70 mg via INTRAVENOUS

## 2015-09-21 MED ORDER — SODIUM CHLORIDE 0.9 % IV SOLN: INTRAVENOUS | Status: DC

## 2015-09-21 MED ORDER — ONDANSETRON HCL 4 MG/2ML IJ SOLN: 4.0000 mg | Freq: Once | INTRAMUSCULAR | Status: DC | PRN

## 2015-09-21 MED ORDER — DEXAMETHASONE SODIUM PHOSPHATE 4 MG/ML IJ SOLN
INTRAMUSCULAR | Status: DC | PRN
  Administered 2015-09-21: 4 mg via INTRAVENOUS

## 2015-09-21 MED ORDER — GLYCOPYRROLATE 0.2 MG/ML IJ SOLN
INTRAMUSCULAR | Status: DC | PRN
  Administered 2015-09-21 (×2): 0.1 mg via INTRAVENOUS

## 2015-09-21 MED ORDER — ONDANSETRON HCL 4 MG/2ML IJ SOLN
INTRAMUSCULAR | Status: DC | PRN
  Administered 2015-09-21: 4 mg via INTRAVENOUS

## 2015-09-21 MED ORDER — LABETALOL HCL 5 MG/ML IV SOLN
10.0000 mg | INTRAVENOUS | Status: DC | PRN
  Administered 2015-09-21: 10 mg via INTRAVENOUS
  Filled 2015-09-21 (×3): qty 4

## 2015-09-21 MED ORDER — PROPOFOL 10 MG/ML IV BOLUS
INTRAVENOUS | Status: DC | PRN
  Administered 2015-09-21: 150 mg via INTRAVENOUS

## 2015-09-21 MED ORDER — INDOMETHACIN 50 MG RE SUPP
100.0000 mg | Freq: Once | RECTAL | Status: AC
  Administered 2015-09-21: 100 mg via RECTAL

## 2015-09-21 NOTE — Progress Notes (Signed)
[facility] Progress Note   [name] [name]  WLS:[id number]  DOB: 25-Oct-1929  DOA: 09/19/2015 PCP: Wyatt Haste, MD  Brief narrative: [name] [name] is a 82 y.o. male with past medical history of coronary artery disease status post CABG, mitral valve repair, hypertension, BPH, hyperlipidemia who presents to the hospital from the [facility] office as a direct admit for epigastric pain and "shakes" along with elevated LFTs suspected to be from cholangitis.    Subjective: No further episodes of pain over night. No nausea or fevers  Assessment/Plan: Principal Problem:   Transaminitis/  Abdominal pain/ shakes - MRCP unrevealing- ERCP today-   - he has a h/o cholangitis about 2 yrs ago- he has had a cholecystectomy > 10 yrs ago  Active Problems: Mild hypokalemia -Replace again today   Hypotension with h/o HTN - hypotension resolved  - have ordered Norvasc and ARB     S/P mitral valve repair  BPH - resumed home meds     Antibiotics: Anti-infectives    Start     Dose/Rate Route Frequency Ordered Stop   09/21/15 0800  ciprofloxacin (CIPRO) IVPB 400 mg     400 mg 200 mL/hr over 60 Minutes Intravenous  Once 09/20/15 0949 09/21/15 1337     Code Status:     Code Status Orders        Start     Ordered   09/19/15 1757  Full code   Continuous     09/19/15 1757    Code Status History    Date Active Date Inactive Code Status Order ID Comments User Context   This patient has a current code status but no historical code status.     Family Communication:  Disposition Plan:  ERCP today- home tomorrow? DVT prophylaxis: Heparin Consultants: GI Procedures: MRCP, ERCP    Objective: Filed Weights   09/19/15 1800 09/21/15 1225  Weight: 86 kg (189 lb 9.5 oz) 85.73 kg (189 lb)    Intake/Output Summary (Last 24 hours) at 09/21/15 1610 Last data filed at 09/21/15 1424  Gross per 24 hour  Intake   1385 ml  Output    960 ml  Net    425 ml      Vitals Filed Vitals:   09/21/15 1510 09/21/15 1520 09/21/15 1530 09/21/15 1540  BP: 169/88 184/64 177/71 177/67  Pulse: 65 65 63 60  Temp:      TempSrc:      Resp: 12 14 11 16   Height:      Weight:      SpO2: 93% 93% 94% 95%    Exam:  General:  Pt is alert, not in acute distress  HEENT: No icterus, No thrush, oral mucosa moist  Cardiovascular: regular rate and rhythm, S1/S2 No murmur  Respiratory: clear to auscultation bilaterally   Abdomen: Soft, +Bowel sounds, non tender, non distended, no guarding  MSK: No cyanosis or clubbing- no pedal edema   Data Reviewed: Basic Metabolic Panel:  Recent Labs Lab 09/19/15 1516 09/19/15 2104 09/20/15 0544 09/21/15 0529  NA 139  --  139 140  K 3.3*  --  3.4* 3.3*  CL 102  --  106 104  CO2 28  --  26 28  GLUCOSE 117*  --  102* 92  BUN 17  --  13 14  CREATININE 1.12 0.97 0.86 0.97  CALCIUM 8.7  --  7.8* 7.9*  MG  --   --   --  1.7   Liver Function  Tests:  Recent Labs Lab 09/19/15 1516 09/20/15 0544 09/21/15 0529  AST 343* 157* 89*  ALT 241* 157* 104*  ALKPHOS 456* 322* 252*  BILITOT 1.5* 1.6* 1.3*  PROT 6.9 5.9* 5.9*  ALBUMIN 3.7 3.2* 3.0*    Recent Labs Lab 09/19/15 1516  LIPASE 16.0   No results for input(s): AMMONIA in the last 168 hours. CBC:  Recent Labs Lab 09/19/15 1516 09/19/15 2104 09/20/15 0544  WBC 10.2 6.3 6.5  NEUTROABS 8.9*  --   --   HGB 13.6 12.3* 12.2*  HCT 41.3 37.8* 37.7*  MCV 88.8 92.0 92.0  PLT 187.0 166 154   Cardiac Enzymes: No results for input(s): CKTOTAL, CKMB, CKMBINDEX, TROPONINI in the last 168 hours. BNP (last 3 results) No results for input(s): BNP in the last 8760 hours.  ProBNP (last 3 results) No results for input(s): PROBNP in the last 8760 hours.  CBG: No results for input(s): GLUCAP in the last 168 hours.  No results found for this or any previous visit (from the past 240 hour(s)).   Studies: Mr Abdomen Mrcp Wo Cm  09/20/2015  CLINICAL DATA:   82 year old male with history of abdominal and epigastric pain. Prior history of ERCP and stone removal 2 years ago. ERCP pending for tomorrow. Evaluate for potential common bile duct obstruction. EXAM: MRI ABDOMEN WITHOUT CONTRAST  (INCLUDING MRCP) TECHNIQUE: Multiplanar multisequence MR imaging of the abdomen was performed. Heavily T2-weighted images of the biliary and pancreatic ducts were obtained, and three-dimensional MRCP images were rendered by post processing. COMPARISON:  MRI of the abdomen 10/18/2013. CT of the abdomen and pelvis 08/16/2014. FINDINGS: Lower chest: Cardiomegaly. Susceptibility artifact in the lower sternum related to median sternotomy wires. Hepatobiliary: Multilocular cystic lesion in the left lobe of the liver between segments 2 and 3 measuring approximately 3.8 x 2.7 cm demonstrating low T1 signal intensity, high T2 signal intensity and multiple thin internal septations. Postcontrast images were not obtained. This lesion is very similar in size in appearance to prior study 10/18/2013, strongly favored to be benign. No other suspicious hepatic lesions are identified on today's noncontrast examination. MRCP images demonstrate moderate intra and extrahepatic biliary ductal dilatation which is very similar to the prior examination. Common bile duct measures up to 13 mm in the porta hepatis which is unchanged. No definite filling defect within the common bile duct to suggest choledocholithiasis. Status post cholecystectomy. Pancreas: No definite pancreatic mass identified on today's noncontrast examination. Pancreatic duct is upper limits of normal measuring 3 mm on MRCP images. No pancreatic or peripancreatic fluid or inflammatory changes. Spleen: Unremarkable. Adrenals/Urinary Tract: Associated with both kidneys there are numerous lesions which are low T1 and high T2 signal intensity. These are incompletely characterized on today's noncontrast examination, but are presumably multiple  cysts, the largest of which is exophytic measuring up to 9.3 cm extending off the upper pole of the right kidney. No hydroureteronephrosis. Bilateral adrenal glands are normal in appearance. Stomach/Bowel: Visualized portions are unremarkable. Vascular/Lymphatic: No aneurysm identified in the visualized abdominal vasculature on today's noncontrast examination. No definite lymphadenopathy noted in the abdomen. Other: No significant volume of ascites in the visualized peritoneal cavity. Musculoskeletal: No aggressive osseous lesions are noted in the visualized portions of the skeleton. IMPRESSION: 1. Today's study appears very similar to prior examination from 10/19/2014. There is chronic intra and extrahepatic biliary ductal dilatation which appears to be benign likely related to post cholecystectomy physiology and age. No choledocholithiasis or obstructing lesion identified on today's examination.  2. Additional incidental findings, similar to the prior examination, as detailed above. Electronically Signed   By: Vinnie Langton M.D.   On: 09/20/2015 12:17   Mr 3d Recon At Scanner  09/20/2015  CLINICAL DATA:  82 year old male with history of abdominal and epigastric pain. Prior history of ERCP and stone removal 2 years ago. ERCP pending for tomorrow. Evaluate for potential common bile duct obstruction. EXAM: MRI ABDOMEN WITHOUT CONTRAST  (INCLUDING MRCP) TECHNIQUE: Multiplanar multisequence MR imaging of the abdomen was performed. Heavily T2-weighted images of the biliary and pancreatic ducts were obtained, and three-dimensional MRCP images were rendered by post processing. COMPARISON:  MRI of the abdomen 10/18/2013. CT of the abdomen and pelvis 08/16/2014. FINDINGS: Lower chest: Cardiomegaly. Susceptibility artifact in the lower sternum related to median sternotomy wires. Hepatobiliary: Multilocular cystic lesion in the left lobe of the liver between segments 2 and 3 measuring approximately 3.8 x 2.7 cm  demonstrating low T1 signal intensity, high T2 signal intensity and multiple thin internal septations. Postcontrast images were not obtained. This lesion is very similar in size in appearance to prior study 10/18/2013, strongly favored to be benign. No other suspicious hepatic lesions are identified on today's noncontrast examination. MRCP images demonstrate moderate intra and extrahepatic biliary ductal dilatation which is very similar to the prior examination. Common bile duct measures up to 13 mm in the porta hepatis which is unchanged. No definite filling defect within the common bile duct to suggest choledocholithiasis. Status post cholecystectomy. Pancreas: No definite pancreatic mass identified on today's noncontrast examination. Pancreatic duct is upper limits of normal measuring 3 mm on MRCP images. No pancreatic or peripancreatic fluid or inflammatory changes. Spleen: Unremarkable. Adrenals/Urinary Tract: Associated with both kidneys there are numerous lesions which are low T1 and high T2 signal intensity. These are incompletely characterized on today's noncontrast examination, but are presumably multiple cysts, the largest of which is exophytic measuring up to 9.3 cm extending off the upper pole of the right kidney. No hydroureteronephrosis. Bilateral adrenal glands are normal in appearance. Stomach/Bowel: Visualized portions are unremarkable. Vascular/Lymphatic: No aneurysm identified in the visualized abdominal vasculature on today's noncontrast examination. No definite lymphadenopathy noted in the abdomen. Other: No significant volume of ascites in the visualized peritoneal cavity. Musculoskeletal: No aggressive osseous lesions are noted in the visualized portions of the skeleton. IMPRESSION: 1. Today's study appears very similar to prior examination from 10/19/2014. There is chronic intra and extrahepatic biliary ductal dilatation which appears to be benign likely related to post cholecystectomy  physiology and age. No choledocholithiasis or obstructing lesion identified on today's examination. 2. Additional incidental findings, similar to the prior examination, as detailed above. Electronically Signed   By: Vinnie Langton M.D.   On: 09/20/2015 12:17    Scheduled Meds:  Scheduled Meds: . [MAR Hold] amLODipine  5 mg Oral QPM  . [MAR Hold] dutasteride  0.5 mg Oral QPM  . [MAR Hold] gabapentin  300 mg Oral QID  . [MAR Hold] heparin  5,000 Units Subcutaneous 3 times per day  . [MAR Hold] irbesartan  300 mg Oral Daily  . [MAR Hold] potassium chloride  40 mEq Oral Once  . ursodiol  300 mg Oral BID   Continuous Infusions: . sodium chloride 50 mL/hr at 09/20/15 1504    Time spent on care of this patient: 30 min   [place], MD 09/21/2015, 4:10 PM  LOS: 2 days   [facility] Office  [phone] Pager - Text Page per [email or web address] If 7PM-7AM, please contact  night-coverage [email or web address]

## 2015-09-21 NOTE — Progress Notes (Signed)
   Appears to have tolerated ERCP well.  Anticipate home tomorrow if ok in AM. Starting ursodiol 300 mg bid We will contact him and arrange repeat labs and f/u with [place].  Discussed w/ son Dr. Luana Shu  Appreciate [facility] help.  [name], MD, [facility] [phone] (pager) [phone] after 5 PM, weekends and holidays  09/21/2015 3:00 PM

## 2015-09-21 NOTE — Progress Notes (Signed)
[facility] Progress Note  Subjective:    Per Dr Carlean Purl 09/20/15: MRCP does not show stones but clinical scenario very compatible with biliary sxs so I am in favor of going ahead with an ERCP and hopefully cholangioscopy tomorrow. Could have soft stones, sludge, debris, etc. Pt feels well this morning. No complaints. For ERCP at [facility] later today.   Objective:  Vital signs in last 24 hours: Temp:  [97.6 F (36.4 C)-98.2 F (36.8 C)] 98.2 F (36.8 C) (02/10 0607) Pulse Rate:  [51-98] 51 (02/10 0607) Resp:  [20] 20 (02/10 0607) BP: (148-165)/(68-69) 148/68 mmHg (02/10 0607) SpO2:  [92 %-96 %] 92 % (02/10 0607)   General:   Alert,  Well-developed,    in NAD Heart:  Regular rate and rhythm; no murmurs Pulm;lungs clear Abdomen:  Soft, nontender and nondistended. Normal bowel sounds, without guarding, and without rebound.   Extremities:  Without edema. t.  Intake/Output from previous day: 02/09 0701 - 02/10 0700 In: 2225 [P.O.:480; I.V.:1745] Out: 810 [Urine:810] Intake/Output this shift:    Lab Results:  Recent Labs  09/19/15 1516 09/19/15 2104 09/20/15 0544  WBC 10.2 6.3 6.5  HGB 13.6 12.3* 12.2*  HCT 41.3 37.8* 37.7*  PLT 187.0 166 154   BMET  Recent Labs  09/19/15 1516 09/19/15 2104 09/20/15 0544 09/21/15 0529  NA 139  --  139 140  K 3.3*  --  3.4* 3.3*  CL 102  --  106 104  CO2 28  --  26 28  GLUCOSE 117*  --  102* 92  BUN 17  --  13 14  CREATININE 1.12 0.97 0.86 0.97  CALCIUM 8.7  --  7.8* 7.9*   LFT  Recent Labs  09/21/15 0529  PROT 5.9*  ALBUMIN 3.0*  AST 89*  ALT 104*  ALKPHOS 252*  BILITOT 1.3*   PT/INR  Recent Labs  09/20/15 0544  LABPROT 15.1  INR 1.22      Mr Abdomen Mrcp Wo Cm  09/20/2015  CLINICAL DATA:  82 year old male with history of abdominal and epigastric pain. Prior history of ERCP and stone removal 2 years ago. ERCP pending for tomorrow. Evaluate for potential common bile duct obstruction. EXAM:  MRI ABDOMEN WITHOUT CONTRAST  (INCLUDING MRCP) TECHNIQUE: Multiplanar multisequence MR imaging of the abdomen was performed. Heavily T2-weighted images of the biliary and pancreatic ducts were obtained, and three-dimensional MRCP images were rendered by post processing. COMPARISON:  MRI of the abdomen 10/18/2013. CT of the abdomen and pelvis 08/16/2014. FINDINGS: Lower chest: Cardiomegaly. Susceptibility artifact in the lower sternum related to median sternotomy wires. Hepatobiliary: Multilocular cystic lesion in the left lobe of the liver between segments 2 and 3 measuring approximately 3.8 x 2.7 cm demonstrating low T1 signal intensity, high T2 signal intensity and multiple thin internal septations. Postcontrast images were not obtained. This lesion is very similar in size in appearance to prior study 10/18/2013, strongly favored to be benign. No other suspicious hepatic lesions are identified on today's noncontrast examination. MRCP images demonstrate moderate intra and extrahepatic biliary ductal dilatation which is very similar to the prior examination. Common bile duct measures up to 13 mm in the porta hepatis which is unchanged. No definite filling defect within the common bile duct to suggest choledocholithiasis. Status post cholecystectomy. Pancreas: No definite pancreatic mass identified on today's noncontrast examination. Pancreatic duct is upper limits of normal measuring 3 mm on MRCP images. No pancreatic or peripancreatic fluid or inflammatory changes. Spleen: Unremarkable.  Adrenals/Urinary Tract: Associated with both kidneys there are numerous lesions which are low T1 and high T2 signal intensity. These are incompletely characterized on today's noncontrast examination, but are presumably multiple cysts, the largest of which is exophytic measuring up to 9.3 cm extending off the upper pole of the right kidney. No hydroureteronephrosis. Bilateral adrenal glands are normal in appearance. Stomach/Bowel:  Visualized portions are unremarkable. Vascular/Lymphatic: No aneurysm identified in the visualized abdominal vasculature on today's noncontrast examination. No definite lymphadenopathy noted in the abdomen. Other: No significant volume of ascites in the visualized peritoneal cavity. Musculoskeletal: No aggressive osseous lesions are noted in the visualized portions of the skeleton. IMPRESSION: 1. Today's study appears very similar to prior examination from 10/19/2014. There is chronic intra and extrahepatic biliary ductal dilatation which appears to be benign likely related to post cholecystectomy physiology and age. No choledocholithiasis or obstructing lesion identified on today's examination. 2. Additional incidental findings, similar to the prior examination, as detailed above. Electronically Signed   By: Vinnie Langton M.D.   On: 09/20/2015 12:17   Mr 3d Recon At Scanner  09/20/2015  CLINICAL DATA:  82 year old male with history of abdominal and epigastric pain. Prior history of ERCP and stone removal 2 years ago. ERCP pending for tomorrow. Evaluate for potential common bile duct obstruction. EXAM: MRI ABDOMEN WITHOUT CONTRAST  (INCLUDING MRCP) TECHNIQUE: Multiplanar multisequence MR imaging of the abdomen was performed. Heavily T2-weighted images of the biliary and pancreatic ducts were obtained, and three-dimensional MRCP images were rendered by post processing. COMPARISON:  MRI of the abdomen 10/18/2013. CT of the abdomen and pelvis 08/16/2014. FINDINGS: Lower chest: Cardiomegaly. Susceptibility artifact in the lower sternum related to median sternotomy wires. Hepatobiliary: Multilocular cystic lesion in the left lobe of the liver between segments 2 and 3 measuring approximately 3.8 x 2.7 cm demonstrating low T1 signal intensity, high T2 signal intensity and multiple thin internal septations. Postcontrast images were not obtained. This lesion is very similar in size in appearance to prior study  10/18/2013, strongly favored to be benign. No other suspicious hepatic lesions are identified on today's noncontrast examination. MRCP images demonstrate moderate intra and extrahepatic biliary ductal dilatation which is very similar to the prior examination. Common bile duct measures up to 13 mm in the porta hepatis which is unchanged. No definite filling defect within the common bile duct to suggest choledocholithiasis. Status post cholecystectomy. Pancreas: No definite pancreatic mass identified on today's noncontrast examination. Pancreatic duct is upper limits of normal measuring 3 mm on MRCP images. No pancreatic or peripancreatic fluid or inflammatory changes. Spleen: Unremarkable. Adrenals/Urinary Tract: Associated with both kidneys there are numerous lesions which are low T1 and high T2 signal intensity. These are incompletely characterized on today's noncontrast examination, but are presumably multiple cysts, the largest of which is exophytic measuring up to 9.3 cm extending off the upper pole of the right kidney. No hydroureteronephrosis. Bilateral adrenal glands are normal in appearance. Stomach/Bowel: Visualized portions are unremarkable. Vascular/Lymphatic: No aneurysm identified in the visualized abdominal vasculature on today's noncontrast examination. No definite lymphadenopathy noted in the abdomen. Other: No significant volume of ascites in the visualized peritoneal cavity. Musculoskeletal: No aggressive osseous lesions are noted in the visualized portions of the skeleton. IMPRESSION: 1. Today's study appears very similar to prior examination from 10/19/2014. There is chronic intra and extrahepatic biliary ductal dilatation which appears to be benign likely related to post cholecystectomy physiology and age. No choledocholithiasis or obstructing lesion identified on today's examination. 2. Additional incidental  findings, similar to the prior examination, as detailed above. Electronically Signed    By: Vinnie Langton M.D.   On: 09/20/2015 12:17    ASSESSMENT/PLAN:  82 year old male with history of choledocholithiasis and gallstone pancreatitis admitted with abdominal pain and elevated bilirubin and alkaline phosphatase concerning for recurrent choledocholithiasis. MRCP be showed stable dilation of intrahepatic and CBD. No stones in the CBD. He could've potentially passed the stone or may have some soft sludge in the duct causing the blockage. Plan for ERCP today.  Principal Problem:   Transaminitis Active Problems:   HTN (hypertension)   S/P mitral valve repair   Abdominal pain     LOS: 2 days   Shanquita Ronning, Vita Barley PA-C 09/21/2015, Pager [phone] Mon-Fri 8a-5p [phone] after 5p, weekends, holidays

## 2015-09-21 NOTE — Anesthesia Preprocedure Evaluation (Addendum)
Anesthesia Evaluation  Patient identified by MRN, date of birth, ID band Patient awake    Reviewed: Allergy & Precautions, NPO status , Patient's Chart, lab work & pertinent test results  Airway Mallampati: II   Neck ROM: Full    Dental  (+) Teeth Intact, Dental Advisory Given   Pulmonary    breath sounds clear to auscultation       Cardiovascular hypertension,  Rhythm:Regular Rate:Normal     Neuro/Psych    GI/Hepatic   Endo/Other    Renal/GU      Musculoskeletal   Abdominal   Peds  Hematology   Anesthesia Other Findings   Reproductive/Obstetrics                            Anesthesia Physical Anesthesia Plan  ASA: III  Anesthesia Plan: General   Post-op Pain Management:    Induction: Intravenous  Airway Management Planned: Oral ETT  Additional Equipment:   Intra-op Plan:   Post-operative Plan:   Informed Consent: I have reviewed the patients History and Physical, chart, labs and discussed the procedure including the risks, benefits and alternatives for the proposed anesthesia with the patient or authorized representative who has indicated his/her understanding and acceptance.   Dental advisory given  Plan Discussed with: CRNA and Anesthesiologist  Anesthesia Plan Comments:         Anesthesia Quick Evaluation

## 2015-09-21 NOTE — Anesthesia Procedure Notes (Signed)
Procedure Name: Intubation Date/Time: 09/21/2015 1:41 PM Performed by: Merrilyn Puma B Pre-anesthesia Checklist: Patient identified, Emergency Drugs available, Timeout performed, Suction available and Patient being monitored Patient Re-evaluated:Patient Re-evaluated prior to inductionOxygen Delivery Method: Circle system utilized Preoxygenation: Pre-oxygenation with 100% oxygen Intubation Type: IV induction Ventilation: Mask ventilation without difficulty Laryngoscope Size: Mac and 4 Grade View: Grade I Tube type: Oral Tube size: 7.5 mm Number of attempts: 1 Airway Equipment and Method: Stylet Placement Confirmation: CO2 detector,  positive ETCO2,  ETT inserted through vocal cords under direct vision and breath sounds checked- equal and bilateral Secured at: 22 cm Tube secured with: Tape Dental Injury: Teeth and Oropharynx as per pre-operative assessment

## 2015-09-21 NOTE — Progress Notes (Signed)
Pt arrived unit from [facility] hospital post ERCP. Alert and oriented, will continue with current plan of care.

## 2015-09-21 NOTE — Progress Notes (Signed)
Pt off unit via [facility] to [facility] hospital for ERCP.

## 2015-09-21 NOTE — Transfer of Care (Signed)
Immediate Anesthesia Transfer of Care Note  Patient: OMARE BILOTTA  Procedure(s) Performed: Procedure(s) with comments: ENDOSCOPIC RETROGRADE CHOLANGIOPANCREATOGRAPHY (ERCP) (N/A) - with spyglass SPYGLASS CHOLANGIOSCOPY (N/A)  Patient Location: Endoscopy Unit  Anesthesia Type:General  Level of Consciousness: awake, alert  and oriented  Airway & Oxygen Therapy: Patient Spontanous Breathing and Patient connected to nasal cannula oxygen  Post-op Assessment: Report given to RN, Post -op Vital signs reviewed and stable and Patient moving all extremities X 4  Post vital signs: Reviewed and stable  Last Vitals:  Filed Vitals:   09/21/15 1225 09/21/15 1445  BP: 203/88 184/100  Pulse: 61 72  Temp: 37.4 C   Resp: 19 13    Complications: No apparent anesthesia complications

## 2015-09-21 NOTE — Telephone Encounter (Signed)
Refilled

## 2015-09-21 NOTE — Op Note (Signed)
[facility] [place], [address]   ERCP PROCEDURE REPORT        EXAM DATE: 09/21/2015  PATIENT NAME:          [name] [name]          MR #: [id number] BIRTHDATE:       01-19-1930     VISIT #:     [id number] ATTENDING:     [name], MD, [facility]     STATUS:     outpatient  ASSISTANT:      William Dalton and Berneta Levins   INDICATIONS:  The patient is a 82 yr old male here for an ERCP due to Biliary colic, abnormal LFT's and dilated biliary tree - hx prior stones.  MRCP negative but second post stone removal attack of biliary colic so elected to proceed with ERCP. PROCEDURE PERFORMED:     ERCP with sphincterotomy/papillotomy ERCP with removal of calculus/calculi cholangioscopy (Spyglass) MEDICATIONS:     Per Anesthesia  CONSENT: The patient understands the risks and benefits of the procedure and understands that these risks include, but are not limited to: sedation, allergic reaction, infection, perforation and/or bleeding. Alternative means of evaluation and treatment include, among others: physical exam, x-rays, and/or surgical intervention. The patient elects to proceed with this endoscopic procedure.  DESCRIPTION OF PROCEDURE: During intra-op preparation period all mechanical & medical equipment was checked for proper function. Hand hygiene and appropriate measures for infection prevention was taken. After the risks, benefits and alternatives of the procedure were thoroughly explained, Informed was verified, confirmed and timeout was successfully executed by the treatment team. With the patient in left semi-prone position, medications were administered intravenously.The Pentax Ercp Scope [id number] was passed from the mouth into the esophagus and further advanced from the esophagus into the stomach. From stomach scope was directed to the second portion of the duodenum.  Major papilla was aligned with  the duodenoscope. The scope position was confirmed fluoroscopically. Rest of the findings/therapeutics are given below. The scope was then completely withdrawn from the patient and the procedure completed. The pulse, BP, and O2 saturation were monitored and documented by the anesthesia nursing staff throughout the entire procedure. The patient was cared for as planned according to standard protocol. The patient was then discharged to recovery in stable condition and with appropriate post procedure care. Estimated blood loss is zero unless otherwise noted in this procedure report.  1) Normal stomach and duodenum seen 2) Major papilla s/p biliary sphincterotomy 3) Cannulated papilla with wire and after failed to admit Spyglass cholangioscope sphincterotomy extended allowing passage 4) Normal CBD, CHD and bifurcation seen with cholangoscope though distal duct not seen well as cholangioscope would not stay in 5) Balloon inserted and occlusion cholangiogram and sweep showed dilated biliary tree with CBD 13-14 mm and moderate amount of sludge removed with sweeps. 6) No pancreatogram by intent.    ADVERSE EVENT:     There were no complications. IMPRESSIONS:     1) Major papilla s/p biliary sphincterotomy 2) Normal CBD, CHD and bifurcation seen with cholangoscope though distal duct not seen well as cholangioscope would not stay in 3) Balloon inserted and occlusion cholangiogram and sweep showed dilated biliary tree with CBD 13-14 mm and moderate amount of sludge removed with sweeps. 4) No pancreatogram by intent  RECOMMENDATIONS:     Clears tonight If ok in AM could go home - should not need antibiotics at dc Will start ursodiol 300 mg  bid to reduce chances of stone formation in future    [name], MD, [facility] eSigned:  [name], MD, [facility] 10/09/2015 2:38 PM    CPT CODES:     1.  31281 Endoscopic retrograde cholangiopancreatography (ERCP); w/  sphincterotomy/papillotomy 2.  18867 Endoscopic retrograde cholangiopancreatography (ERCP); w/ endoscopic retrograde removal of calculus/calculi from biliary and/or pancreatic ducts   The ICD and CPT codes recommended by this software are interpretations from the data that the clinical staff has captured with the software.  The verification of the translation of this report to the ICD and CPT codes and modifiers is the sole responsibility of the health care institution and practicing physician where this report was generated.  [employer]. will not be held responsible for the validity of the ICD and CPT codes included on this report.  AMA assumes no liability for data contained or not contained herein. CPT is a Designer, television/film set of the [employer].   PATIENT NAME:  [name] [name] [name] [name] MR#: [id number]

## 2015-09-21 NOTE — Telephone Encounter (Signed)
[facility] pharmacy is calling back requesting a refill on the Valsartan-HCT. Please advise

## 2015-09-21 NOTE — Telephone Encounter (Signed)
°*  STAT* If patient is at the pharmacy, call can be transferred to refill team.   1. Which medications need to be refilled? (please list name of each medication and dose if known) Valsartan HCTZ 320-12.5 mg   2. Which pharmacy/location (including street and city if local pharmacy) is medication to be sent to?[facility] on [address]   3. Do they need a 30 day or 90 day supply? [place]

## 2015-09-21 NOTE — Telephone Encounter (Signed)
Rx(s) sent to pharmacy electronically.  

## 2015-09-22 DIAGNOSIS — Z79899 Other long term (current) drug therapy: Secondary | ICD-10-CM | POA: Diagnosis not present

## 2015-09-22 DIAGNOSIS — R740 Nonspecific elevation of levels of transaminase and lactic acid dehydrogenase [LDH]: Secondary | ICD-10-CM | POA: Diagnosis not present

## 2015-09-22 DIAGNOSIS — Z9889 Other specified postprocedural states: Secondary | ICD-10-CM | POA: Diagnosis not present

## 2015-09-22 DIAGNOSIS — I9589 Other hypotension: Secondary | ICD-10-CM | POA: Diagnosis not present

## 2015-09-22 DIAGNOSIS — N400 Enlarged prostate without lower urinary tract symptoms: Secondary | ICD-10-CM | POA: Diagnosis not present

## 2015-09-22 DIAGNOSIS — I10 Essential (primary) hypertension: Secondary | ICD-10-CM | POA: Diagnosis not present

## 2015-09-22 DIAGNOSIS — I2510 Atherosclerotic heart disease of native coronary artery without angina pectoris: Secondary | ICD-10-CM | POA: Diagnosis not present

## 2015-09-22 DIAGNOSIS — Z9049 Acquired absence of other specified parts of digestive tract: Secondary | ICD-10-CM | POA: Diagnosis not present

## 2015-09-22 DIAGNOSIS — I959 Hypotension, unspecified: Secondary | ICD-10-CM | POA: Diagnosis not present

## 2015-09-22 DIAGNOSIS — K838 Other specified diseases of biliary tract: Secondary | ICD-10-CM | POA: Diagnosis not present

## 2015-09-22 DIAGNOSIS — E785 Hyperlipidemia, unspecified: Secondary | ICD-10-CM | POA: Diagnosis not present

## 2015-09-22 DIAGNOSIS — Z950 Presence of cardiac pacemaker: Secondary | ICD-10-CM | POA: Diagnosis not present

## 2015-09-22 DIAGNOSIS — Z951 Presence of aortocoronary bypass graft: Secondary | ICD-10-CM | POA: Diagnosis not present

## 2015-09-22 DIAGNOSIS — E876 Hypokalemia: Secondary | ICD-10-CM | POA: Diagnosis not present

## 2015-09-22 DIAGNOSIS — Z7982 Long term (current) use of aspirin: Secondary | ICD-10-CM | POA: Diagnosis not present

## 2015-09-22 DIAGNOSIS — M1990 Unspecified osteoarthritis, unspecified site: Secondary | ICD-10-CM | POA: Diagnosis not present

## 2015-09-22 LAB — COMPREHENSIVE METABOLIC PANEL
ALK PHOS: 339 U/L — AB (ref 38–126)
ALT: 119 U/L — ABNORMAL HIGH (ref 17–63)
ANION GAP: 10 (ref 5–15)
AST: 105 U/L — ABNORMAL HIGH (ref 15–41)
Albumin: 3 g/dL — ABNORMAL LOW (ref 3.5–5.0)
BILIRUBIN TOTAL: 1.2 mg/dL (ref 0.3–1.2)
BUN: 12 mg/dL (ref 6–20)
CALCIUM: 8.2 mg/dL — AB (ref 8.9–10.3)
CO2: 26 mmol/L (ref 22–32)
Chloride: 105 mmol/L (ref 101–111)
Creatinine, Ser: 0.9 mg/dL (ref 0.61–1.24)
GFR calc non Af Amer: >60 mL/min (ref >60)
Glucose, Bld: 124 mg/dL — ABNORMAL HIGH (ref 65–99)
POTASSIUM: 3.7 mmol/L (ref 3.5–5.1)
SODIUM: 141 mmol/L (ref 135–145)
TOTAL PROTEIN: 5.9 g/dL — AB (ref 6.5–8.1)

## 2015-09-22 MED ORDER — URSODIOL 300 MG PO CAPS
300.0000 mg | ORAL_CAPSULE | Freq: Two times a day (BID) | ORAL | Status: DC
Start: 2015-09-22 — End: 2015-10-30

## 2015-09-22 NOTE — Progress Notes (Signed)
[facility] Progress Note  Subjective:  S/P ERCP yesterday:IMPRESSIONS: 1) Major papilla s/p biliary sphincterotomy 2) Normal CBD, CHD and bifurcation seen with cholangoscope though distal duct not seen well as cholangioscope would not stay in 3) Balloon inserted and occlusion cholangiogram and sweep showed dilated biliary tree with CBD 13-14 mm and moderate amount of sludge removed with sweeps. 4) No pancreatogram by intent RECOMMENDATIONS: Clears tonight If ok in AM could go home - should not need antibiotics at dc Will start ursodiol 300 mg bid to reduce chances of stone formation in future Labs today with alk phos 339, ALT 119, AST 105, t bili 1.2. Feels well. No abs pain. No N/V. Passing small amts gas. Hungry. Wants to go home   Objective:  Vital signs in last 24 hours: Temp:  [97.7 F (36.5 C)-99.3 F (37.4 C)] 98.1 F (36.7 C) (02/11 0459) Pulse Rate:  [49-72] 49 (02/11 0459) Resp:  [9-19] 16 (02/11 0459) BP: (152-203)/(61-100) 152/61 mmHg (02/11 0459) SpO2:  [93 %-99 %] 97 % (02/11 0459) Weight:  [189 lb (85.73 kg)] 189 lb (85.73 kg) (02/10 1225)   General:   Alert,  Well-developed,    in NAD Heart:  Regular rate and rhythm; no murmurs Pulm;lungs clear Abdomen:  Soft, nontender and nondistended. Normal bowel sounds, without guarding, and without rebound.   Extremities:  Without edema.   Intake/Output from previous day: 02/10 0701 - 02/11 0700 In: 640 [P.O.:240; I.V.:400] Out: 701 [Urine:700; Stool:1] Intake/Output this shift:    Lab Results:  Recent Labs  09/19/15 1516 09/19/15 2104 09/20/15 0544  WBC 10.2 6.3 6.5  HGB 13.6 12.3* 12.2*  HCT 41.3 37.8* 37.7*  PLT 187.0 166 154   BMET  Recent Labs  09/20/15 0544 09/21/15 0529 09/22/15 0500  NA 139 140 141  K 3.4* 3.3* 3.7  CL 106 104 105  CO2 26 28 26   GLUCOSE 102* 92 124*  BUN 13 14 12   CREATININE 0.86 0.97 0.90  CALCIUM 7.8* 7.9* 8.2*   LFT  Recent Labs   09/22/15 0500  PROT 5.9*  ALBUMIN 3.0*  AST 105*  ALT 119*  ALKPHOS 339*  BILITOT 1.2   PT/INR  Recent Labs  09/20/15 0544  LABPROT 15.1  INR 1.22   Hepatitis Panel No results for input(s): HEPBSAG, HCVAB, HEPAIGM, HEPBIGM in the last 72 hours.  Mr Abdomen Mrcp Wo Cm  09/20/2015  CLINICAL DATA:  82 year old male with history of abdominal and epigastric pain. Prior history of ERCP and stone removal 2 years ago. ERCP pending for tomorrow. Evaluate for potential common bile duct obstruction. EXAM: MRI ABDOMEN WITHOUT CONTRAST  (INCLUDING MRCP) TECHNIQUE: Multiplanar multisequence MR imaging of the abdomen was performed. Heavily T2-weighted images of the biliary and pancreatic ducts were obtained, and three-dimensional MRCP images were rendered by post processing. COMPARISON:  MRI of the abdomen 10/18/2013. CT of the abdomen and pelvis 08/16/2014. FINDINGS: Lower chest: Cardiomegaly. Susceptibility artifact in the lower sternum related to median sternotomy wires. Hepatobiliary: Multilocular cystic lesion in the left lobe of the liver between segments 2 and 3 measuring approximately 3.8 x 2.7 cm demonstrating low T1 signal intensity, high T2 signal intensity and multiple thin internal septations. Postcontrast images were not obtained. This lesion is very similar in size in appearance to prior study 10/18/2013, strongly favored to be benign. No other suspicious hepatic lesions are identified on today's noncontrast examination. MRCP images demonstrate moderate intra and extrahepatic biliary ductal dilatation which is very similar  to the prior examination. Common bile duct measures up to 13 mm in the porta hepatis which is unchanged. No definite filling defect within the common bile duct to suggest choledocholithiasis. Status post cholecystectomy. Pancreas: No definite pancreatic mass identified on today's noncontrast examination. Pancreatic duct is upper limits of normal measuring 3 mm on MRCP images.  No pancreatic or peripancreatic fluid or inflammatory changes. Spleen: Unremarkable. Adrenals/Urinary Tract: Associated with both kidneys there are numerous lesions which are low T1 and high T2 signal intensity. These are incompletely characterized on today's noncontrast examination, but are presumably multiple cysts, the largest of which is exophytic measuring up to 9.3 cm extending off the upper pole of the right kidney. No hydroureteronephrosis. Bilateral adrenal glands are normal in appearance. Stomach/Bowel: Visualized portions are unremarkable. Vascular/Lymphatic: No aneurysm identified in the visualized abdominal vasculature on today's noncontrast examination. No definite lymphadenopathy noted in the abdomen. Other: No significant volume of ascites in the visualized peritoneal cavity. Musculoskeletal: No aggressive osseous lesions are noted in the visualized portions of the skeleton. IMPRESSION: 1. Today's study appears very similar to prior examination from 10/19/2014. There is chronic intra and extrahepatic biliary ductal dilatation which appears to be benign likely related to post cholecystectomy physiology and age. No choledocholithiasis or obstructing lesion identified on today's examination. 2. Additional incidental findings, similar to the prior examination, as detailed above. Electronically Signed   By: Vinnie Langton M.D.   On: 09/20/2015 12:17   Mr 3d Recon At Scanner  09/20/2015  CLINICAL DATA:  82 year old male with history of abdominal and epigastric pain. Prior history of ERCP and stone removal 2 years ago. ERCP pending for tomorrow. Evaluate for potential common bile duct obstruction. EXAM: MRI ABDOMEN WITHOUT CONTRAST  (INCLUDING MRCP) TECHNIQUE: Multiplanar multisequence MR imaging of the abdomen was performed. Heavily T2-weighted images of the biliary and pancreatic ducts were obtained, and three-dimensional MRCP images were rendered by post processing. COMPARISON:  MRI of the abdomen  10/18/2013. CT of the abdomen and pelvis 08/16/2014. FINDINGS: Lower chest: Cardiomegaly. Susceptibility artifact in the lower sternum related to median sternotomy wires. Hepatobiliary: Multilocular cystic lesion in the left lobe of the liver between segments 2 and 3 measuring approximately 3.8 x 2.7 cm demonstrating low T1 signal intensity, high T2 signal intensity and multiple thin internal septations. Postcontrast images were not obtained. This lesion is very similar in size in appearance to prior study 10/18/2013, strongly favored to be benign. No other suspicious hepatic lesions are identified on today's noncontrast examination. MRCP images demonstrate moderate intra and extrahepatic biliary ductal dilatation which is very similar to the prior examination. Common bile duct measures up to 13 mm in the porta hepatis which is unchanged. No definite filling defect within the common bile duct to suggest choledocholithiasis. Status post cholecystectomy. Pancreas: No definite pancreatic mass identified on today's noncontrast examination. Pancreatic duct is upper limits of normal measuring 3 mm on MRCP images. No pancreatic or peripancreatic fluid or inflammatory changes. Spleen: Unremarkable. Adrenals/Urinary Tract: Associated with both kidneys there are numerous lesions which are low T1 and high T2 signal intensity. These are incompletely characterized on today's noncontrast examination, but are presumably multiple cysts, the largest of which is exophytic measuring up to 9.3 cm extending off the upper pole of the right kidney. No hydroureteronephrosis. Bilateral adrenal glands are normal in appearance. Stomach/Bowel: Visualized portions are unremarkable. Vascular/Lymphatic: No aneurysm identified in the visualized abdominal vasculature on today's noncontrast examination. No definite lymphadenopathy noted in the abdomen. Other: No significant volume  of ascites in the visualized peritoneal cavity. Musculoskeletal: No  aggressive osseous lesions are noted in the visualized portions of the skeleton. IMPRESSION: 1. Today's study appears very similar to prior examination from 10/19/2014. There is chronic intra and extrahepatic biliary ductal dilatation which appears to be benign likely related to post cholecystectomy physiology and age. No choledocholithiasis or obstructing lesion identified on today's examination. 2. Additional incidental findings, similar to the prior examination, as detailed above. Electronically Signed   By: Vinnie Langton M.D.   On: 09/20/2015 12:17   Dg Ercp With Sphincterotomy  09/22/2015  CLINICAL DATA:  Cholangitis EXAM: ERCP TECHNIQUE: Multiple spot images obtained with the fluoroscopic device and submitted for interpretation post-procedure. FLUOROSCOPY TIME:  2 minutes, 38 seconds COMPARISON:  Abdominal MRI - 09/20/2015 FINDINGS: Four spot intraoperative fluoroscopic images of the right upper abdominal quadrant during ERCP are provided for review. Initial image demonstrates an ERCP probe overlying the right upper abdominal quadrant. Subsequent images demonstrate progressive opacification of the common bile duct which appears at least moderately dilated. There is minimal opacification of the central aspect of the intrahepatic biliary tree which also appears moderately dilated. Surgical clips overlie the expected location of the gallbladder fossa. There is minimal opacification of the residual cystic duct. No discrete filling defects within the opacified portions of the biliary tree though note there is only faint opacification. No definitive opacification of the pancreatic duct. IMPRESSION: ERCP with findings of intra and extrahepatic bili duct dilatation as detailed above. These images were submitted for radiologic interpretation only. Please see the procedural report for the amount of contrast and the fluoroscopy time utilized. Electronically Signed   By: Sandi Mariscal M.D.   On: 09/22/2015 06:34     ASSESSMENT/PLAN:  S/P ERCP. Doing well. Will advacne to soft diet. If tol, can go home later today.Would keep on ursodiol 300 mg bid.  Pt should have repeat labs drawn mid next week--will have GI office arrange.GI office will contact pt with f/u appt as well.   Principal Problem:   Transaminitis Active Problems:   HTN (hypertension)   S/P mitral valve repair   Abdominal pain   Choledocholithiasis     LOS: 3 days   Wana Mount P PA-C 09/22/2015, Pager [phone] Mon-Fri 8a-5p [phone] after 5p, weekends, holidays

## 2015-09-22 NOTE — Discharge Summary (Signed)
Physician Discharge Summary  [name] GMW:[id number] DOB: [birth year] DOA: 09/19/2015  PCP: Wyatt Haste, MD  Admit date: 09/19/2015 Discharge date: 09/22/2015  Time spent: 40 minutes  Recommendations for Outpatient Follow-up:  1. GI f/u for further abdominal pain   Discharge Condition: stable    Discharge Diagnoses:  Principal Problem:   Transaminitis Active Problems:   HTN (hypertension)   S/P mitral valve repair   Abdominal pain   History of present illness:  [name] [name] is a 82 y.o. male with past medical history of coronary artery disease status post CABG, mitral valve repair, hypertension, BPH, hyperlipidemia who presents to the hospital from the [facility] GI office as a direct admit for epigastric pain and "shakes" along with elevated LFTs suspected to be from cholangitis.   Hospital Course:  Principal Problem:  Transaminitis/ Abdominal pain/ shakes - he has a h/o cholangitis about 2 yrs ago (ERCP found CBD stone)- he has had a cholecystectomy > 10 yrs ago - MRCP unrevealing- ERCP done on 2/10revealed moderate amount of sludge found - started on Ursodial by GI to prevent further stones - stable to be discharged home today   Active Problems: Mild hypokalemia -Replaced agressively   Hypotension with h/o HTN - hypotension resolved  - have ordered Norvasc and ARB    S/P mitral valve repair  BPH - resumed home meds    Procedures:  2/10 - ERCP  Consultations:  GI  Discharge Exam: Filed Weights   09/19/15 1800 09/21/15 1225  Weight: 86 kg (189 lb 9.5 oz) 85.73 kg (189 lb)   Filed Vitals:   09/21/15 2053 09/22/15 0459  BP: 165/81 152/61  Pulse: 49 49  Temp: 98 F (36.7 C) 98.1 F (36.7 C)  Resp: 18 16    General: AAO x 3, no distress Cardiovascular: RRR, no murmurs  Respiratory: clear to auscultation bilaterally GI: soft, non-tender, non-distended, bowel sound positive  Discharge Instructions You were cared for by a  hospitalist during your hospital stay. If you have any questions about your discharge medications or the care you received while you were in the hospital after you are discharged, you can call the unit and asked to speak with the hospitalist on call if the hospitalist that took care of you is not available. Once you are discharged, your primary care physician will handle any further medical issues. Please note that NO REFILLS for any discharge medications will be authorized once you are discharged, as it is imperative that you return to your primary care physician (or establish a relationship with a primary care physician if you do not have one) for your aftercare needs so that they can reassess your need for medications and monitor your lab values.      Discharge Instructions    Diet - low sodium heart healthy    Complete by:  As directed      Increase activity slowly    Complete by:  As directed             Medication List    TAKE these medications        acetaminophen 500 MG tablet  Commonly known as:  TYLENOL  Take 1,000 mg by mouth every 6 (six) hours as needed for mild pain.     amLODipine 5 MG tablet  Commonly known as:  NORVASC  Take 1 tablet (5 mg total) by mouth every evening.     amoxicillin 500 MG capsule  Commonly known as:  AMOXIL  Take  4 capsules by mouth 1 day or 1 dose. 1 hour before dental appt.     aspirin 325 MG EC tablet  Take 325 mg by mouth every evening.     B-12 PO  Take 1 tablet by mouth daily.     beclomethasone 42 MCG/SPRAY nasal spray  Commonly known as:  BECONASE AQ  Place 2 sprays into both nostrils 1 day or 1 dose. Dose is for each nostril.     calcium carbonate 500 MG chewable tablet  Commonly known as:  TUMS - dosed in mg elemental calcium  Chew 1 tablet by mouth as needed for indigestion or heartburn.     CRESTOR 20 MG tablet  Generic drug:  rosuvastatin  take 1 tablet by mouth once daily     dicyclomine 10 MG capsule  Commonly known  as:  BENTYL  Take 1 capsule (10 mg total) by mouth every 8 (eight) hours as needed for spasms.     dutasteride 0.5 MG capsule  Commonly known as:  AVODART  Take 0.5 mg by mouth every evening.     famotidine 20 MG tablet  Commonly known as:  PEPCID  Take 20 mg by mouth daily as needed for heartburn or indigestion.     FLOMAX 0.4 MG Caps capsule  Generic drug:  tamsulosin  Take 0.4 mg by mouth every evening.     gabapentin 300 MG capsule  Commonly known as:  NEURONTIN  Take 300 mg by mouth 4 (four) times daily.     SYSTANE 0.4-0.3 % Soln  Generic drug:  Polyethyl Glycol-Propyl Glycol  Place 1 drop into both eyes as needed (dry eye).     ursodiol 300 MG capsule  Commonly known as:  ACTIGALL  Take 1 capsule (300 mg total) by mouth 2 (two) times daily.     valsartan-hydrochlorothiazide 320-12.5 MG tablet  Commonly known as:  DIOVAN-HCT  Take 1 tablet by mouth every morning.     Vitamin D 1000 units capsule  Take 1,000 Units by mouth daily.       No Known Allergies Follow-up Information    Follow up with Silvano Rusk, MD.   Specialty:  Gastroenterology   Why:  Office will contact you and arrange follow-up   Contact information:   [address] [place] [address] [phone]        The results of significant diagnostics from this hospitalization (including imaging, microbiology, ancillary and laboratory) are listed below for reference.    Significant Diagnostic Studies: Mr Abdomen Mrcp Wo Cm  09/20/2015  CLINICAL DATA:  82 year old male with history of abdominal and epigastric pain. Prior history of ERCP and stone removal 2 years ago. ERCP pending for tomorrow. Evaluate for potential common bile duct obstruction. EXAM: MRI ABDOMEN WITHOUT CONTRAST  (INCLUDING MRCP) TECHNIQUE: Multiplanar multisequence MR imaging of the abdomen was performed. Heavily T2-weighted images of the biliary and pancreatic ducts were obtained, and three-dimensional MRCP images were  rendered by post processing. COMPARISON:  MRI of the abdomen 10/18/2013. CT of the abdomen and pelvis 08/16/2014. FINDINGS: Lower chest: Cardiomegaly. Susceptibility artifact in the lower sternum related to median sternotomy wires. Hepatobiliary: Multilocular cystic lesion in the left lobe of the liver between segments 2 and 3 measuring approximately 3.8 x 2.7 cm demonstrating low T1 signal intensity, high T2 signal intensity and multiple thin internal septations. Postcontrast images were not obtained. This lesion is very similar in size in appearance to prior study 10/18/2013, strongly favored to be benign.  No other suspicious hepatic lesions are identified on today's noncontrast examination. MRCP images demonstrate moderate intra and extrahepatic biliary ductal dilatation which is very similar to the prior examination. Common bile duct measures up to 13 mm in the porta hepatis which is unchanged. No definite filling defect within the common bile duct to suggest choledocholithiasis. Status post cholecystectomy. Pancreas: No definite pancreatic mass identified on today's noncontrast examination. Pancreatic duct is upper limits of normal measuring 3 mm on MRCP images. No pancreatic or peripancreatic fluid or inflammatory changes. Spleen: Unremarkable. Adrenals/Urinary Tract: Associated with both kidneys there are numerous lesions which are low T1 and high T2 signal intensity. These are incompletely characterized on today's noncontrast examination, but are presumably multiple cysts, the largest of which is exophytic measuring up to 9.3 cm extending off the upper pole of the right kidney. No hydroureteronephrosis. Bilateral adrenal glands are normal in appearance. Stomach/Bowel: Visualized portions are unremarkable. Vascular/Lymphatic: No aneurysm identified in the visualized abdominal vasculature on today's noncontrast examination. No definite lymphadenopathy noted in the abdomen. Other: No significant volume of  ascites in the visualized peritoneal cavity. Musculoskeletal: No aggressive osseous lesions are noted in the visualized portions of the skeleton. IMPRESSION: 1. Today's study appears very similar to prior examination from 10/19/2014. There is chronic intra and extrahepatic biliary ductal dilatation which appears to be benign likely related to post cholecystectomy physiology and age. No choledocholithiasis or obstructing lesion identified on today's examination. 2. Additional incidental findings, similar to the prior examination, as detailed above. Electronically Signed   By: Vinnie Langton M.D.   On: 09/20/2015 12:17   Mr 3d Recon At Scanner  09/20/2015  CLINICAL DATA:  82 year old male with history of abdominal and epigastric pain. Prior history of ERCP and stone removal 2 years ago. ERCP pending for tomorrow. Evaluate for potential common bile duct obstruction. EXAM: MRI ABDOMEN WITHOUT CONTRAST  (INCLUDING MRCP) TECHNIQUE: Multiplanar multisequence MR imaging of the abdomen was performed. Heavily T2-weighted images of the biliary and pancreatic ducts were obtained, and three-dimensional MRCP images were rendered by post processing. COMPARISON:  MRI of the abdomen 10/18/2013. CT of the abdomen and pelvis 08/16/2014. FINDINGS: Lower chest: Cardiomegaly. Susceptibility artifact in the lower sternum related to median sternotomy wires. Hepatobiliary: Multilocular cystic lesion in the left lobe of the liver between segments 2 and 3 measuring approximately 3.8 x 2.7 cm demonstrating low T1 signal intensity, high T2 signal intensity and multiple thin internal septations. Postcontrast images were not obtained. This lesion is very similar in size in appearance to prior study 10/18/2013, strongly favored to be benign. No other suspicious hepatic lesions are identified on today's noncontrast examination. MRCP images demonstrate moderate intra and extrahepatic biliary ductal dilatation which is very similar to the prior  examination. Common bile duct measures up to 13 mm in the porta hepatis which is unchanged. No definite filling defect within the common bile duct to suggest choledocholithiasis. Status post cholecystectomy. Pancreas: No definite pancreatic mass identified on today's noncontrast examination. Pancreatic duct is upper limits of normal measuring 3 mm on MRCP images. No pancreatic or peripancreatic fluid or inflammatory changes. Spleen: Unremarkable. Adrenals/Urinary Tract: Associated with both kidneys there are numerous lesions which are low T1 and high T2 signal intensity. These are incompletely characterized on today's noncontrast examination, but are presumably multiple cysts, the largest of which is exophytic measuring up to 9.3 cm extending off the upper pole of the right kidney. No hydroureteronephrosis. Bilateral adrenal glands are normal in appearance. Stomach/Bowel: Visualized portions are  unremarkable. Vascular/Lymphatic: No aneurysm identified in the visualized abdominal vasculature on today's noncontrast examination. No definite lymphadenopathy noted in the abdomen. Other: No significant volume of ascites in the visualized peritoneal cavity. Musculoskeletal: No aggressive osseous lesions are noted in the visualized portions of the skeleton. IMPRESSION: 1. Today's study appears very similar to prior examination from 10/19/2014. There is chronic intra and extrahepatic biliary ductal dilatation which appears to be benign likely related to post cholecystectomy physiology and age. No choledocholithiasis or obstructing lesion identified on today's examination. 2. Additional incidental findings, similar to the prior examination, as detailed above. Electronically Signed   By: Vinnie Langton M.D.   On: 09/20/2015 12:17   Dg Ercp With Sphincterotomy  09/22/2015  CLINICAL DATA:  Cholangitis EXAM: ERCP TECHNIQUE: Multiple spot images obtained with the fluoroscopic device and submitted for interpretation  post-procedure. FLUOROSCOPY TIME:  2 minutes, 38 seconds COMPARISON:  Abdominal MRI - 09/20/2015 FINDINGS: Four spot intraoperative fluoroscopic images of the right upper abdominal quadrant during ERCP are provided for review. Initial image demonstrates an ERCP probe overlying the right upper abdominal quadrant. Subsequent images demonstrate progressive opacification of the common bile duct which appears at least moderately dilated. There is minimal opacification of the central aspect of the intrahepatic biliary tree which also appears moderately dilated. Surgical clips overlie the expected location of the gallbladder fossa. There is minimal opacification of the residual cystic duct. No discrete filling defects within the opacified portions of the biliary tree though note there is only faint opacification. No definitive opacification of the pancreatic duct. IMPRESSION: ERCP with findings of intra and extrahepatic bili duct dilatation as detailed above. These images were submitted for radiologic interpretation only. Please see the procedural report for the amount of contrast and the fluoroscopy time utilized. Electronically Signed   By: Sandi Mariscal M.D.   On: 09/22/2015 06:34    Microbiology: No results found for this or any previous visit (from the past 240 hour(s)).   Labs: Basic Metabolic Panel:  Recent Labs Lab 09/19/15 1516 09/19/15 2104 09/20/15 0544 09/21/15 0529 09/22/15 0500  NA 139  --  139 140 141  K 3.3*  --  3.4* 3.3* 3.7  CL 102  --  106 104 105  CO2 28  --  26 28 26   GLUCOSE 117*  --  102* 92 124*  BUN 17  --  13 14 12   CREATININE 1.12 0.97 0.86 0.97 0.90  CALCIUM 8.7  --  7.8* 7.9* 8.2*  MG  --   --   --  1.7  --    Liver Function Tests:  Recent Labs Lab 09/19/15 1516 09/20/15 0544 09/21/15 0529 09/22/15 0500  AST 343* 157* 89* 105*  ALT 241* 157* 104* 119*  ALKPHOS 456* 322* 252* 339*  BILITOT 1.5* 1.6* 1.3* 1.2  PROT 6.9 5.9* 5.9* 5.9*  ALBUMIN 3.7 3.2* 3.0*  3.0*    Recent Labs Lab 09/19/15 1516  LIPASE 16.0   No results for input(s): AMMONIA in the last 168 hours. CBC:  Recent Labs Lab 09/19/15 1516 09/19/15 2104 09/20/15 0544  WBC 10.2 6.3 6.5  NEUTROABS 8.9*  --   --   HGB 13.6 12.3* 12.2*  HCT 41.3 37.8* 37.7*  MCV 88.8 92.0 92.0  PLT 187.0 166 154   Cardiac Enzymes: No results for input(s): CKTOTAL, CKMB, CKMBINDEX, TROPONINI in the last 168 hours. BNP: BNP (last 3 results) No results for input(s): BNP in the last 8760 hours.  ProBNP (last 3 results)  No results for input(s): PROBNP in the last 8760 hours.  CBG: No results for input(s): GLUCAP in the last 168 hours.     SignedDebbe Odea, MD [facility] 09/22/2015, 3:33 PM

## 2015-09-22 NOTE — Progress Notes (Signed)
Pt leaving at this time with his son. Alert, oriented, and without c/o. Discharge instructions given/explained with pt verbalizing understanding. Pt alerted to pick up prescriptions at said pharmacy.

## 2015-09-22 NOTE — Anesthesia Postprocedure Evaluation (Signed)
Anesthesia Post Note  Patient: [name] [name]  Procedure(s) Performed: Procedure(s) (LRB): ENDOSCOPIC RETROGRADE CHOLANGIOPANCREATOGRAPHY (ERCP) (N/A) SPYGLASS CHOLANGIOSCOPY (N/A)  Patient location during evaluation: Endoscopy Anesthesia Type: General Level of consciousness: awake and awake and alert Pain management: pain level controlled Vital Signs Assessment: post-procedure vital signs reviewed and stable Respiratory status: spontaneous breathing and nonlabored ventilation Cardiovascular status: blood pressure returned to baseline    Last Vitals:  Filed Vitals:   09/21/15 2053 09/22/15 0459  BP: 165/81 152/61  Pulse: 49 49  Temp: 36.7 C 36.7 C  Resp: 18 16    Last Pain:  Filed Vitals:   09/22/15 1125  PainSc: 0-No pain                 Nashaun Hillmer COKER

## 2015-09-24 ENCOUNTER — Telehealth: Payer: Self-pay

## 2015-09-24 ENCOUNTER — Telehealth: Payer: Self-pay | Admitting: Family Medicine

## 2015-09-24 ENCOUNTER — Encounter (HOSPITAL_COMMUNITY): Payer: Self-pay | Admitting: Internal Medicine

## 2015-09-24 DIAGNOSIS — R945 Abnormal results of liver function studies: Principal | ICD-10-CM

## 2015-09-24 DIAGNOSIS — R7989 Other specified abnormal findings of blood chemistry: Secondary | ICD-10-CM

## 2015-09-24 NOTE — Telephone Encounter (Signed)
Patient notified and orders entered

## 2015-09-24 NOTE — Telephone Encounter (Signed)
Pt was called concerning recent hospital stay. Per discharge summary it was unclear if we needed to follow up or not. Pt states he is doing much better and is no longer in pain. He states he does not need to follow up with [place] and has already scheduled appt with GI.

## 2015-09-24 NOTE — Telephone Encounter (Signed)
-----   Message from [name], MD sent at 09/21/2015  3:10 PM EST ----- Regarding: needs LFT's Still recovering from ERCP now and will go home tomorrow if ok Please contact next week and arrange for LFT's in 1 week from Monday

## 2015-10-01 ENCOUNTER — Other Ambulatory Visit (INDEPENDENT_AMBULATORY_CARE_PROVIDER_SITE_OTHER): Payer: Medicare Other

## 2015-10-01 DIAGNOSIS — R7989 Other specified abnormal findings of blood chemistry: Secondary | ICD-10-CM

## 2015-10-01 DIAGNOSIS — R945 Abnormal results of liver function studies: Principal | ICD-10-CM

## 2015-10-01 LAB — HEPATIC FUNCTION PANEL
ALK PHOS: 142 U/L — AB (ref 39–117)
ALT: 20 U/L (ref 0–53)
AST: 20 U/L (ref 0–37)
Albumin: 4 g/dL (ref 3.5–5.2)
BILIRUBIN DIRECT: 0.2 mg/dL (ref 0.0–0.3)
BILIRUBIN TOTAL: 0.9 mg/dL (ref 0.2–1.2)
Total Protein: 7.1 g/dL (ref 6.0–8.3)

## 2015-10-01 NOTE — Progress Notes (Signed)
Quick Note:  Almost all back to normal If he is feeling ok would just repeat the LFT in 1 month If having pain or other issues let me know ______

## 2015-10-02 ENCOUNTER — Other Ambulatory Visit: Payer: Self-pay

## 2015-10-02 DIAGNOSIS — R7989 Other specified abnormal findings of blood chemistry: Secondary | ICD-10-CM

## 2015-10-02 DIAGNOSIS — R945 Abnormal results of liver function studies: Principal | ICD-10-CM

## 2015-10-08 ENCOUNTER — Telehealth: Payer: Self-pay | Admitting: Family Medicine

## 2015-10-08 MED ORDER — ROSUVASTATIN CALCIUM 20 MG PO TABS: 20.0000 mg | ORAL_TABLET | Freq: Every day | ORAL | Status: DC

## 2015-10-08 NOTE — Telephone Encounter (Signed)
Received faxed request from [facility] on [address]. Please refill crestor 20 mg.

## 2015-10-15 ENCOUNTER — Telehealth: Payer: Self-pay | Admitting: Internal Medicine

## 2015-10-15 NOTE — Telephone Encounter (Signed)
It is the ursodiol Reduce to 1 capsule a day to see if that reduces diarrhea - if not then discontinue

## 2015-10-15 NOTE — Telephone Encounter (Signed)
Patient calling to report several episodes of diarrhea in the last week, mild stomach pain after he eats and constant light headedness for several days. He took anti-diarrheal medication and it helped. He is concerned that the Bentyl or Ursodiol may be causing these symptoms.

## 2015-10-15 NOTE — Telephone Encounter (Signed)
Spoke with patient and gave him recommendations.

## 2015-10-18 DIAGNOSIS — M62830 Muscle spasm of back: Secondary | ICD-10-CM | POA: Diagnosis not present

## 2015-10-18 DIAGNOSIS — G603 Idiopathic progressive neuropathy: Secondary | ICD-10-CM | POA: Diagnosis not present

## 2015-10-18 DIAGNOSIS — M4726 Other spondylosis with radiculopathy, lumbar region: Secondary | ICD-10-CM | POA: Diagnosis not present

## 2015-10-18 DIAGNOSIS — Z79899 Other long term (current) drug therapy: Secondary | ICD-10-CM | POA: Diagnosis not present

## 2015-10-29 DIAGNOSIS — H2513 Age-related nuclear cataract, bilateral: Secondary | ICD-10-CM | POA: Diagnosis not present

## 2015-10-29 DIAGNOSIS — H5213 Myopia, bilateral: Secondary | ICD-10-CM | POA: Diagnosis not present

## 2015-10-30 ENCOUNTER — Other Ambulatory Visit: Payer: Self-pay | Admitting: Internal Medicine

## 2015-10-30 NOTE — Telephone Encounter (Signed)
Ok to refill , looks like dose changed to one a day , see 10/15/15 phone note.

## 2015-10-31 NOTE — Telephone Encounter (Signed)
Refill at 300 mg qd # 30 11 RF

## 2015-11-01 ENCOUNTER — Other Ambulatory Visit (INDEPENDENT_AMBULATORY_CARE_PROVIDER_SITE_OTHER): Payer: Medicare Other

## 2015-11-01 DIAGNOSIS — R7989 Other specified abnormal findings of blood chemistry: Secondary | ICD-10-CM

## 2015-11-01 DIAGNOSIS — R945 Abnormal results of liver function studies: Principal | ICD-10-CM

## 2015-11-01 LAB — HEPATIC FUNCTION PANEL
ALBUMIN: 3.9 g/dL (ref 3.5–5.2)
ALK PHOS: 90 U/L (ref 39–117)
ALT: 10 U/L (ref 0–53)
AST: 19 U/L (ref 0–37)
Bilirubin, Direct: 0.2 mg/dL (ref 0.0–0.3)
TOTAL PROTEIN: 7.1 g/dL (ref 6.0–8.3)
Total Bilirubin: 1 mg/dL (ref 0.2–1.2)

## 2015-11-02 DIAGNOSIS — M545 Low back pain: Secondary | ICD-10-CM | POA: Diagnosis not present

## 2015-11-05 NOTE — Progress Notes (Signed)
Quick Note:  LFT's all NL As long as he is ok nothing else to be done and see me prn ______

## 2015-11-12 DIAGNOSIS — M545 Low back pain: Secondary | ICD-10-CM | POA: Diagnosis not present

## 2015-11-22 DIAGNOSIS — M546 Pain in thoracic spine: Secondary | ICD-10-CM | POA: Diagnosis not present

## 2015-12-03 DIAGNOSIS — M546 Pain in thoracic spine: Secondary | ICD-10-CM | POA: Diagnosis not present

## 2015-12-04 ENCOUNTER — Telehealth: Payer: Self-pay | Admitting: Internal Medicine

## 2015-12-04 NOTE — Telephone Encounter (Signed)
Pt states he is still having diarrhea. Per last telephone if diarrhea continued pt could D/C ursodiol. Pt instructed to stop the ursodiol and to call back if no improvement. Pt verbalized understanding.

## 2015-12-12 DIAGNOSIS — M546 Pain in thoracic spine: Secondary | ICD-10-CM | POA: Diagnosis not present

## 2015-12-12 DIAGNOSIS — M542 Cervicalgia: Secondary | ICD-10-CM | POA: Diagnosis not present

## 2015-12-21 ENCOUNTER — Other Ambulatory Visit: Payer: Self-pay

## 2015-12-21 MED ORDER — VALSARTAN-HYDROCHLOROTHIAZIDE 320-12.5 MG PO TABS: 1.0000 | ORAL_TABLET | Freq: Every morning | ORAL | Status: DC

## 2015-12-24 ENCOUNTER — Telehealth: Payer: Self-pay | Admitting: Internal Medicine

## 2015-12-24 DIAGNOSIS — M545 Low back pain: Secondary | ICD-10-CM | POA: Diagnosis not present

## 2015-12-24 DIAGNOSIS — M546 Pain in thoracic spine: Secondary | ICD-10-CM | POA: Diagnosis not present

## 2015-12-25 NOTE — Telephone Encounter (Signed)
Patient with terrible diarrhea, despite med changes.  He has had a few episodes of incontinence.  He will come in on Thursday and see Dr. Carlean Purl at 2:30

## 2015-12-27 ENCOUNTER — Encounter: Payer: Self-pay | Admitting: Internal Medicine

## 2015-12-27 ENCOUNTER — Ambulatory Visit (INDEPENDENT_AMBULATORY_CARE_PROVIDER_SITE_OTHER): Payer: Medicare Other | Admitting: Internal Medicine

## 2015-12-27 VITALS — BP 160/76 | HR 95 | Ht 74.0 in | Wt 183.0 lb

## 2015-12-27 DIAGNOSIS — R143 Flatulence: Secondary | ICD-10-CM

## 2015-12-27 DIAGNOSIS — R197 Diarrhea, unspecified: Secondary | ICD-10-CM

## 2015-12-27 DIAGNOSIS — IMO0001 Reserved for inherently not codable concepts without codable children: Secondary | ICD-10-CM

## 2015-12-27 MED ORDER — METRONIDAZOLE 500 MG PO TABS: 500.0000 mg | ORAL_TABLET | Freq: Three times a day (TID) | ORAL | Status: AC

## 2015-12-27 NOTE — Progress Notes (Addendum)
   Subjective:    Patient ID: [name] [name], male    DOB: Nov 23, 1929, 82 y.o.   MRN: [id number] Chief complaint follow-up of diarrhea bloating and gas HPI The patient has been seen in the past for suspected IBS. He had seen one of our physician assistance and since then has been using [facility]. He is not sure that that is helped. Things really seem to start when he started ursodeoxycholic acid. We reduced the dose, but the diarrhea did not disappear. He has stopped it, and he is better but he has explosions of gas at night. There some small or even larger incontinence. This is intermittent. He generally okay during the day. His appetite is good and he would like to come off the FODMAPS diet. He does recall taking a course of metronidazole in the past and said that seemed to help him with similar symptoms prior to his ERCP and removal of sludge and stones recently. Medications, allergies, past medical history, past surgical history, family history and social history are reviewed and updated in the EMR.  Review of Systems As above still very functional    Objective:   Physical Exam Well-developed well-nourished elderly white man in no acute distress BP 160/76 mmHg  Pulse 95  Ht 6' 2"  (1.88 m)  Wt 183 lb (83.008 kg)  BMI 23.49 kg/m2 Abdomen is soft and nontender Is alert and oriented 3 Mood and affect are appropriate  Data reviewed include recent ERCP previous GI notes in the past few months labs in EMR 2017     Assessment & Plan:   1. Diarrhea, unspecified type   2. Gas     He clearly had problems with ursodeoxycholic acid and I don't think he can tolerate that. He may have intermittent small bowel bacterial overgrowth. C. difficile is a possibility since he was in the hospital and had prophylactic antibiotics with his ERCP also.  I checked C. difficile and that has returned negative. Prior to that I had started him on metronidazole, at time of office visit as an empiric  treatment for small bowel bacterial overgrowth and plus possible C. Difficile.  He will take that and let me know how he is. We will notify him about the negative C. difficile PCR.  He will not resume ursodeoxycholic acid. He will discontinue FODMAPS diet  Another I have is that he could be having diarrhea related to continuous drainage of bile that improved after movable of sludge and stones from his bile duct though that is possibly plausible seems very unlikely overall. However, if he has persistent problems I would potentially consider a bile salt binder.  15 minutes time spent with patient > half in counseling coordination of care  I appreciate the opportunity to care for this patient. CC: Wyatt Haste, MD

## 2015-12-27 NOTE — Patient Instructions (Signed)
  Your physician has requested that you go to the basement for the following lab work before leaving today: C-Diff stool study  We have sent the following medications to your pharmacy for you to pick up at your convenience: Flagyl  Stop the FODMAP.   Call us back the first week of June with an update.   Follow up with Dr Carlean Purl in July.   I appreciate the opportunity to care for you. Silvano Rusk, MD, [facility]

## 2015-12-28 ENCOUNTER — Telehealth: Payer: Self-pay | Admitting: Internal Medicine

## 2015-12-28 ENCOUNTER — Other Ambulatory Visit: Payer: Medicare Other

## 2015-12-28 DIAGNOSIS — R197 Diarrhea, unspecified: Secondary | ICD-10-CM

## 2015-12-28 NOTE — Telephone Encounter (Signed)
Left message for pt to call back  °

## 2015-12-31 ENCOUNTER — Encounter: Payer: Self-pay | Admitting: Internal Medicine

## 2015-12-31 LAB — CLOSTRIDIUM DIFFICILE BY PCR: Toxigenic C. Difficile by PCR: NOT DETECTED

## 2015-12-31 NOTE — Progress Notes (Signed)
Quick Note:  Please let him know that his C. difficile is negative, I hope the metronidazole is helping him anyway and I would like to hear from him when he is finished as we discussed ______

## 2015-12-31 NOTE — Telephone Encounter (Signed)
Patient notified of the results of c-diff.  He will call back with an update.

## 2015-12-31 NOTE — Telephone Encounter (Signed)
Left message for patient to call back  

## 2016-01-03 DIAGNOSIS — M549 Dorsalgia, unspecified: Secondary | ICD-10-CM | POA: Diagnosis not present

## 2016-01-03 DIAGNOSIS — M546 Pain in thoracic spine: Secondary | ICD-10-CM | POA: Diagnosis not present

## 2016-01-04 DIAGNOSIS — D1801 Hemangioma of skin and subcutaneous tissue: Secondary | ICD-10-CM | POA: Diagnosis not present

## 2016-01-04 DIAGNOSIS — L570 Actinic keratosis: Secondary | ICD-10-CM | POA: Diagnosis not present

## 2016-01-04 DIAGNOSIS — D3617 Benign neoplasm of peripheral nerves and autonomic nervous system of trunk, unspecified: Secondary | ICD-10-CM | POA: Diagnosis not present

## 2016-01-04 DIAGNOSIS — Z85828 Personal history of other malignant neoplasm of skin: Secondary | ICD-10-CM | POA: Diagnosis not present

## 2016-01-04 DIAGNOSIS — L821 Other seborrheic keratosis: Secondary | ICD-10-CM | POA: Diagnosis not present

## 2016-01-11 ENCOUNTER — Telehealth: Payer: Self-pay | Admitting: Internal Medicine

## 2016-01-11 NOTE — Telephone Encounter (Signed)
Patient reports that he has improved.  He has been having formed stools for the last week to 10 days until today.  He has had a loose stool today with excess gas this am.  He completed metronidazole about 3 days ago.  He is hoping that this was a fluke.  He said he will call back if he has continued gas and loose stool.

## 2016-01-17 DIAGNOSIS — M4608 Spinal enthesopathy, sacral and sacrococcygeal region: Secondary | ICD-10-CM | POA: Diagnosis not present

## 2016-01-17 DIAGNOSIS — M62830 Muscle spasm of back: Secondary | ICD-10-CM | POA: Diagnosis not present

## 2016-01-17 DIAGNOSIS — M4726 Other spondylosis with radiculopathy, lumbar region: Secondary | ICD-10-CM | POA: Diagnosis not present

## 2016-01-17 DIAGNOSIS — Z79899 Other long term (current) drug therapy: Secondary | ICD-10-CM | POA: Diagnosis not present

## 2016-01-21 ENCOUNTER — Encounter: Payer: Self-pay | Admitting: Cardiology

## 2016-01-21 ENCOUNTER — Ambulatory Visit (INDEPENDENT_AMBULATORY_CARE_PROVIDER_SITE_OTHER): Payer: Medicare Other | Admitting: Cardiology

## 2016-01-21 VITALS — BP 140/80 | HR 81 | Ht 74.0 in | Wt 183.0 lb

## 2016-01-21 DIAGNOSIS — I25810 Atherosclerosis of coronary artery bypass graft(s) without angina pectoris: Secondary | ICD-10-CM

## 2016-01-21 DIAGNOSIS — Z9889 Other specified postprocedural states: Secondary | ICD-10-CM | POA: Diagnosis not present

## 2016-01-21 DIAGNOSIS — I493 Ventricular premature depolarization: Secondary | ICD-10-CM

## 2016-01-21 DIAGNOSIS — I10 Essential (primary) hypertension: Secondary | ICD-10-CM

## 2016-01-21 DIAGNOSIS — E785 Hyperlipidemia, unspecified: Secondary | ICD-10-CM

## 2016-01-21 NOTE — Patient Instructions (Signed)
You may reduce ASA to 81 mg daily  We will schedule you for an Echocardiogram  I will see you in 6 months.

## 2016-01-21 NOTE — Progress Notes (Signed)
[name] Date of Birth: 1930-05-02   History of Present Illness: [name] is seen today for followup s/p MVR. He is status post mitral valve repair with single vessel coronary bypass surgery (due to refractory coronary spasm)  in 2002. Follow up Echo in April 2012 showed a good MV repair. EF was 55%. He had mild aortic stenosis. He also has a history of HTN and PVC/PACs. On follow up today he is doing very well from a cardiac standpoint. No chest pain or dyspnea. Denies palpitations. He did have a lot of problems with biliary colic earlier this year. Had ERCP with Dr. Carlean Purl. Prior to this he was having a lot of gas, belching, gastric discomfort and some chest pressure. This has all resolved. He subsequently had some persistent diarrhea that improved with Flagyl although C. Diff was negative. He is also having a lot of lumbar/ back issues and is receiving PT and is followed by pain management. [facility]  Current Outpatient Prescriptions on File Prior to Visit  Medication Sig Dispense Refill  . acetaminophen (TYLENOL) 500 MG tablet Take 1,000 mg by mouth every 6 (six) hours as needed for mild pain.    Marland Kitchen amLODipine (NORVASC) 5 MG tablet Take 1 tablet (5 mg total) by mouth every evening. 30 tablet 6  . amoxicillin (AMOXIL) 500 MG capsule Take 4 capsules by mouth 1 day or 1 dose. 1 hour before dental appt.  0  . aspirin 325 MG EC tablet Take 325 mg by mouth every evening.     . beclomethasone (BECONASE AQ) 42 MCG/SPRAY nasal spray Place 2 sprays into both nostrils 1 day or 1 dose. Dose is for each nostril. (Patient taking differently: Place 2 sprays into both nostrils daily as needed for allergies. Dose is for each nostril.(seasonal) April in to June) 25 g 12  . calcium carbonate (TUMS - DOSED IN MG ELEMENTAL CALCIUM) 500 MG chewable tablet Chew 1 tablet by mouth as needed for indigestion or heartburn.    . Cholecalciferol (VITAMIN D) 1000 UNITS capsule Take 1,000  Units by mouth daily.      . Cyanocobalamin (B-12 PO) Take 1 tablet by mouth daily.    Marland Kitchen dutasteride (AVODART) 0.5 MG capsule Take 0.5 mg by mouth every evening.     . famotidine (PEPCID) 20 MG tablet Take 20 mg by mouth daily as needed for heartburn or indigestion.    . gabapentin (NEURONTIN) 300 MG capsule Take 300 mg by mouth 4 (four) times daily.    [name] Glycol-Propyl Glycol (SYSTANE) 0.4-0.3 % SOLN Place 1 drop into both eyes as needed (dry eye).    . rosuvastatin (CRESTOR) 20 MG tablet Take 1 tablet (20 mg total) by mouth daily. 30 tablet 11  . Tamsulosin HCl (FLOMAX) 0.4 MG CAPS Take 0.4 mg by mouth every evening.     . valsartan-hydrochlorothiazide (DIOVAN-HCT) 320-12.5 MG tablet Take 1 tablet by mouth every morning. 90 tablet 0   No current facility-administered medications on file prior to visit.    No Known Allergies  Past Medical History  Diagnosis Date  . MVP (mitral valve prolapse)     s/p Mitral Valve Repair  . CAD (coronary artery disease)     s/p CABG x 1  . HTN (hypertension)   . Hyperlipidemia   . DJD (degenerative joint disease)   . BPH (benign prostatic hyperplasia)   . Arthritis   . Lumbar disc disease   .  Pancreatitis   . Gallstones   . Wandering (atrial) pacemaker 11/22/2013    with frequent multifocal ectopy (PACs, PVCs, junctional escape beats    Past Surgical History  Procedure Laterality Date  . Mitral valve repair  January 2002  . Coronary artery bypass graft  January 2002    Single bypass to the distal RCA  . Colonoscopy  ~2008    no polyps per pt.  Dr. [name]  . Cholecystectomy      Dr. Lennie Hummer  . Upper gastrointestinal endoscopy  02/22/13  . Ercp N/A 02/22/2013    Procedure: ENDOSCOPIC RETROGRADE CHOLANGIOPANCREATOGRAPHY (ERCP);  Surgeon: [name], MD;  Location: [facility];  Service: Endoscopy;  Laterality: N/A;  . Spyglass lithotripsy N/A 02/22/2013    Procedure: [id number] LITHOTRIPSY;  Surgeon: [name],  MD;  Location: [facility];  Service: Endoscopy;  Laterality: N/A;  litho ord'd 7/8 by DL/PO [id number] [facility]  . Spyglass cholangioscopy N/A 02/22/2013    Procedure: JKDTOIZT CHOLANGIOSCOPY;  Surgeon: [name], MD;  Location: [facility];  Service: Endoscopy;  Laterality: N/A;  . Hernia repair  05/05/11    Lap L inguinal & obturator herniae, R Femoral Hernia  . Ercp N/A 09/21/2015    Procedure: ENDOSCOPIC RETROGRADE CHOLANGIOPANCREATOGRAPHY (ERCP);  Surgeon: [name], MD;  Location[facility];  Service: Endoscopy;  Laterality: N/A;  with spyglass  . Spyglass cholangioscopy N/A 09/21/2015    Procedure: IWPYKDXI CHOLANGIOSCOPY;  Surgeon: [name], MD;  Location: [facility];  Service: Endoscopy;  Laterality: N/A;    History  Smoking status  . Never Smoker   Smokeless tobacco  . Never Used    History  Alcohol Use  . 0.0 oz/week  . 1-2 Glasses of wine per week    Comment: rare alcohol use    Family History  Problem Relation Age of Onset  . Stroke Father   . Stroke Mother     TIA    Review of Systems: As noted in history of present illness. All other systems were reviewed and are negative.  Physical Exam: BP 140/80 mmHg  Pulse 81  Ht 6' 2"  (1.88 m)  Wt 183 lb (83.008 kg)  BMI 23.49 kg/m2 He is a pleasant white male in no acute distress. His HEENT exam is unremarkable. He has no JVD or bruits. Lungs are clear. Cardiac exam reveals a regular rate and rhythm. There is a grade 2/6 harsh systolic murmur in the RUSB radiating to the left carotid and apex. No gallop.   Abdomen is soft and nontender without masses or bruits. He has no feet edema.    LABORATORY DATA: Lab Results  Component Value Date   WBC 6.5 09/20/2015   HGB 12.2* 09/20/2015   HCT 37.7* 09/20/2015   PLT 154 09/20/2015   GLUCOSE 124* 09/22/2015   CHOL 104 10/13/2014   TRIG 85 10/13/2014   HDL 43 10/13/2014   LDLCALC 44 10/13/2014   ALT 10 11/01/2015   AST 19 11/01/2015   NA 141 09/22/2015    K 3.7 09/22/2015   CL 105 09/22/2015   CREATININE 0.90 09/22/2015   BUN 12 09/22/2015   CO2 26 09/22/2015   TSH 2.27 07/14/2013   INR 1.22 09/20/2015   Ecg today shows NSR with frequent PVCs. Nonspecific ST changes. I have personally reviewed and interpreted this study.   Assessment / Plan: 1. Mitral insufficiency status post mitral valve repair. Excellent long-term result. It has been 5 years  since last Echo. Will update now.  2. Coronary disease status post single-vessel coronary bypass surgery with a vein graft to the distal RCA due to refractory spasm. He is asymptomatic. Reduce ASA to 81 mg daily.  3. Hypertension, blood pressure is controlled.  4. Chronic PACs and PVCs. These are asymptomatic now. Wandering atrial pacemaker by prior Ecg (not Afib).

## 2016-01-24 DIAGNOSIS — M4608 Spinal enthesopathy, sacral and sacrococcygeal region: Secondary | ICD-10-CM | POA: Diagnosis not present

## 2016-02-15 ENCOUNTER — Other Ambulatory Visit: Payer: Self-pay

## 2016-02-15 ENCOUNTER — Ambulatory Visit (HOSPITAL_COMMUNITY): Payer: Medicare Other | Attending: Cardiovascular Disease

## 2016-02-15 DIAGNOSIS — I352 Nonrheumatic aortic (valve) stenosis with insufficiency: Secondary | ICD-10-CM | POA: Insufficient documentation

## 2016-02-15 DIAGNOSIS — I493 Ventricular premature depolarization: Secondary | ICD-10-CM | POA: Insufficient documentation

## 2016-02-15 DIAGNOSIS — I2510 Atherosclerotic heart disease of native coronary artery without angina pectoris: Secondary | ICD-10-CM | POA: Diagnosis not present

## 2016-02-15 DIAGNOSIS — I10 Essential (primary) hypertension: Secondary | ICD-10-CM | POA: Diagnosis not present

## 2016-02-15 DIAGNOSIS — I25810 Atherosclerosis of coronary artery bypass graft(s) without angina pectoris: Secondary | ICD-10-CM | POA: Diagnosis not present

## 2016-02-15 DIAGNOSIS — Z9889 Other specified postprocedural states: Secondary | ICD-10-CM | POA: Diagnosis not present

## 2016-02-15 DIAGNOSIS — E785 Hyperlipidemia, unspecified: Secondary | ICD-10-CM | POA: Diagnosis not present

## 2016-02-15 DIAGNOSIS — I119 Hypertensive heart disease without heart failure: Secondary | ICD-10-CM | POA: Insufficient documentation

## 2016-02-18 ENCOUNTER — Other Ambulatory Visit: Payer: Self-pay

## 2016-02-18 MED ORDER — AMLODIPINE BESYLATE 5 MG PO TABS: 5.0000 mg | ORAL_TABLET | Freq: Every evening | ORAL | Status: DC

## 2016-02-21 DIAGNOSIS — M62830 Muscle spasm of back: Secondary | ICD-10-CM | POA: Diagnosis not present

## 2016-02-21 DIAGNOSIS — Z79899 Other long term (current) drug therapy: Secondary | ICD-10-CM | POA: Diagnosis not present

## 2016-02-21 DIAGNOSIS — M4722 Other spondylosis with radiculopathy, cervical region: Secondary | ICD-10-CM | POA: Diagnosis not present

## 2016-02-21 DIAGNOSIS — M4608 Spinal enthesopathy, sacral and sacrococcygeal region: Secondary | ICD-10-CM | POA: Diagnosis not present

## 2016-02-22 ENCOUNTER — Ambulatory Visit: Payer: Medicare Other | Admitting: Internal Medicine

## 2016-02-28 ENCOUNTER — Encounter: Payer: Self-pay | Admitting: Family Medicine

## 2016-02-28 ENCOUNTER — Ambulatory Visit (INDEPENDENT_AMBULATORY_CARE_PROVIDER_SITE_OTHER): Payer: Medicare Other | Admitting: Family Medicine

## 2016-02-28 ENCOUNTER — Ambulatory Visit
Admission: RE | Admit: 2016-02-28 | Discharge: 2016-02-28 | Disposition: A | Discharge status: Home / Self Care | Payer: Medicare Other | Source: Ambulatory Visit | Attending: Family Medicine | Admitting: Family Medicine

## 2016-02-28 VITALS — BP 140/90 | HR 70 | Wt 186.0 lb

## 2016-02-28 DIAGNOSIS — M19011 Primary osteoarthritis, right shoulder: Secondary | ICD-10-CM | POA: Diagnosis not present

## 2016-02-28 DIAGNOSIS — M25511 Pain in right shoulder: Secondary | ICD-10-CM

## 2016-02-28 NOTE — Progress Notes (Signed)
   Subjective:    Patient ID: [name] [name], male    DOB: 1930/08/05, 82 y.o.   MRN: [id number]  HPI He complains of a two-week history of right shoulder and neck discomfort. He's had no numbness, tingling, weakness, history of injury to the shoulder. In the last 2 nights he has noted difficulty especially if he tries to lay on that side. He also notes pain with abduction of the shoulder. He does have a previous history of back pain and recently had an SI joint injected which apparently did help.   Review of Systems     Objective:   Physical Exam Passive range of lotion of the shoulder show some limitation with abduction and external rotation. No tenderness to palpation over the bicipital groove. Negative sulcus test. Drop arm test was difficult due to him unable to fully abduct. Near's and Hawkins test was difficult to do.       Assessment & Plan:  Right shoulder pain - Plan: DG Shoulder Right Explained that it's difficult to determine whether his symptoms are joint related or possibly rotator cuff although on thinking more joint since he's having difficulty with nighttime pain. Will get an x-ray and follow-up pending results of that.

## 2016-02-29 ENCOUNTER — Ambulatory Visit: Payer: Medicare Other | Admitting: Internal Medicine

## 2016-03-03 ENCOUNTER — Telehealth: Payer: Self-pay | Admitting: Family Medicine

## 2016-03-03 DIAGNOSIS — M25511 Pain in right shoulder: Secondary | ICD-10-CM | POA: Diagnosis not present

## 2016-03-03 DIAGNOSIS — M4722 Other spondylosis with radiculopathy, cervical region: Secondary | ICD-10-CM | POA: Diagnosis not present

## 2016-03-03 DIAGNOSIS — M4608 Spinal enthesopathy, sacral and sacrococcygeal region: Secondary | ICD-10-CM | POA: Diagnosis not present

## 2016-03-03 NOTE — Telephone Encounter (Signed)
I have called pt back he is a little upset it is taking so long to get something done about his pain in his shoulder he has appointment with Dr.Phillips his pain management Dr. 03/04/16 they will discuss the pos. Shot pt didn't want to go back to ortho but I have faxed ov note and x ray to Dr.Phillips office phone [phone] fax [phone]

## 2016-03-12 DIAGNOSIS — M25511 Pain in right shoulder: Secondary | ICD-10-CM | POA: Diagnosis not present

## 2016-03-12 DIAGNOSIS — R293 Abnormal posture: Secondary | ICD-10-CM | POA: Diagnosis not present

## 2016-03-12 DIAGNOSIS — M542 Cervicalgia: Secondary | ICD-10-CM | POA: Diagnosis not present

## 2016-03-12 DIAGNOSIS — M6281 Muscle weakness (generalized): Secondary | ICD-10-CM | POA: Diagnosis not present

## 2016-03-17 DIAGNOSIS — M542 Cervicalgia: Secondary | ICD-10-CM | POA: Diagnosis not present

## 2016-03-17 DIAGNOSIS — R293 Abnormal posture: Secondary | ICD-10-CM | POA: Diagnosis not present

## 2016-03-17 DIAGNOSIS — M25511 Pain in right shoulder: Secondary | ICD-10-CM | POA: Diagnosis not present

## 2016-03-17 DIAGNOSIS — M6281 Muscle weakness (generalized): Secondary | ICD-10-CM | POA: Diagnosis not present

## 2016-03-21 DIAGNOSIS — M6281 Muscle weakness (generalized): Secondary | ICD-10-CM | POA: Diagnosis not present

## 2016-03-21 DIAGNOSIS — M542 Cervicalgia: Secondary | ICD-10-CM | POA: Diagnosis not present

## 2016-03-21 DIAGNOSIS — M25511 Pain in right shoulder: Secondary | ICD-10-CM | POA: Diagnosis not present

## 2016-03-21 DIAGNOSIS — R293 Abnormal posture: Secondary | ICD-10-CM | POA: Diagnosis not present

## 2016-03-24 DIAGNOSIS — R293 Abnormal posture: Secondary | ICD-10-CM | POA: Diagnosis not present

## 2016-03-24 DIAGNOSIS — M6281 Muscle weakness (generalized): Secondary | ICD-10-CM | POA: Diagnosis not present

## 2016-03-24 DIAGNOSIS — M542 Cervicalgia: Secondary | ICD-10-CM | POA: Diagnosis not present

## 2016-03-24 DIAGNOSIS — M25511 Pain in right shoulder: Secondary | ICD-10-CM | POA: Diagnosis not present

## 2016-03-28 DIAGNOSIS — M25511 Pain in right shoulder: Secondary | ICD-10-CM | POA: Diagnosis not present

## 2016-03-28 DIAGNOSIS — R293 Abnormal posture: Secondary | ICD-10-CM | POA: Diagnosis not present

## 2016-03-28 DIAGNOSIS — M6281 Muscle weakness (generalized): Secondary | ICD-10-CM | POA: Diagnosis not present

## 2016-03-28 DIAGNOSIS — M542 Cervicalgia: Secondary | ICD-10-CM | POA: Diagnosis not present

## 2016-04-02 DIAGNOSIS — R293 Abnormal posture: Secondary | ICD-10-CM | POA: Diagnosis not present

## 2016-04-02 DIAGNOSIS — M25511 Pain in right shoulder: Secondary | ICD-10-CM | POA: Diagnosis not present

## 2016-04-02 DIAGNOSIS — M6281 Muscle weakness (generalized): Secondary | ICD-10-CM | POA: Diagnosis not present

## 2016-04-02 DIAGNOSIS — M542 Cervicalgia: Secondary | ICD-10-CM | POA: Diagnosis not present

## 2016-04-04 DIAGNOSIS — M62830 Muscle spasm of back: Secondary | ICD-10-CM | POA: Diagnosis not present

## 2016-04-04 DIAGNOSIS — M25511 Pain in right shoulder: Secondary | ICD-10-CM | POA: Diagnosis not present

## 2016-04-04 DIAGNOSIS — M4608 Spinal enthesopathy, sacral and sacrococcygeal region: Secondary | ICD-10-CM | POA: Diagnosis not present

## 2016-04-04 DIAGNOSIS — M4722 Other spondylosis with radiculopathy, cervical region: Secondary | ICD-10-CM | POA: Diagnosis not present

## 2016-04-07 DIAGNOSIS — M542 Cervicalgia: Secondary | ICD-10-CM | POA: Diagnosis not present

## 2016-04-07 DIAGNOSIS — M25511 Pain in right shoulder: Secondary | ICD-10-CM | POA: Diagnosis not present

## 2016-04-07 DIAGNOSIS — R293 Abnormal posture: Secondary | ICD-10-CM | POA: Diagnosis not present

## 2016-04-07 DIAGNOSIS — M6281 Muscle weakness (generalized): Secondary | ICD-10-CM | POA: Diagnosis not present

## 2016-04-11 DIAGNOSIS — R293 Abnormal posture: Secondary | ICD-10-CM | POA: Diagnosis not present

## 2016-04-11 DIAGNOSIS — M542 Cervicalgia: Secondary | ICD-10-CM | POA: Diagnosis not present

## 2016-04-11 DIAGNOSIS — M25511 Pain in right shoulder: Secondary | ICD-10-CM | POA: Diagnosis not present

## 2016-04-11 DIAGNOSIS — M6281 Muscle weakness (generalized): Secondary | ICD-10-CM | POA: Diagnosis not present

## 2016-04-18 DIAGNOSIS — R293 Abnormal posture: Secondary | ICD-10-CM | POA: Diagnosis not present

## 2016-04-18 DIAGNOSIS — M25511 Pain in right shoulder: Secondary | ICD-10-CM | POA: Diagnosis not present

## 2016-04-18 DIAGNOSIS — M542 Cervicalgia: Secondary | ICD-10-CM | POA: Diagnosis not present

## 2016-04-18 DIAGNOSIS — M6281 Muscle weakness (generalized): Secondary | ICD-10-CM | POA: Diagnosis not present

## 2016-04-23 DIAGNOSIS — M25511 Pain in right shoulder: Secondary | ICD-10-CM | POA: Diagnosis not present

## 2016-04-23 DIAGNOSIS — M6281 Muscle weakness (generalized): Secondary | ICD-10-CM | POA: Diagnosis not present

## 2016-04-23 DIAGNOSIS — M542 Cervicalgia: Secondary | ICD-10-CM | POA: Diagnosis not present

## 2016-04-23 DIAGNOSIS — R293 Abnormal posture: Secondary | ICD-10-CM | POA: Diagnosis not present

## 2016-04-28 DIAGNOSIS — M542 Cervicalgia: Secondary | ICD-10-CM | POA: Diagnosis not present

## 2016-04-28 DIAGNOSIS — M6281 Muscle weakness (generalized): Secondary | ICD-10-CM | POA: Diagnosis not present

## 2016-04-28 DIAGNOSIS — R293 Abnormal posture: Secondary | ICD-10-CM | POA: Diagnosis not present

## 2016-04-28 DIAGNOSIS — M25511 Pain in right shoulder: Secondary | ICD-10-CM | POA: Diagnosis not present

## 2016-05-02 DIAGNOSIS — R293 Abnormal posture: Secondary | ICD-10-CM | POA: Diagnosis not present

## 2016-05-02 DIAGNOSIS — M25511 Pain in right shoulder: Secondary | ICD-10-CM | POA: Diagnosis not present

## 2016-05-02 DIAGNOSIS — M6281 Muscle weakness (generalized): Secondary | ICD-10-CM | POA: Diagnosis not present

## 2016-05-02 DIAGNOSIS — M542 Cervicalgia: Secondary | ICD-10-CM | POA: Diagnosis not present

## 2016-05-06 ENCOUNTER — Other Ambulatory Visit: Payer: Self-pay | Admitting: Cardiology

## 2016-05-07 DIAGNOSIS — R293 Abnormal posture: Secondary | ICD-10-CM | POA: Diagnosis not present

## 2016-05-07 DIAGNOSIS — M542 Cervicalgia: Secondary | ICD-10-CM | POA: Diagnosis not present

## 2016-05-07 DIAGNOSIS — M25511 Pain in right shoulder: Secondary | ICD-10-CM | POA: Diagnosis not present

## 2016-05-07 DIAGNOSIS — M6281 Muscle weakness (generalized): Secondary | ICD-10-CM | POA: Diagnosis not present

## 2016-05-15 ENCOUNTER — Ambulatory Visit
Admission: RE | Admit: 2016-05-15 | Discharge: 2016-05-15 | Disposition: A | Discharge status: Home / Self Care | Payer: Medicare Other | Source: Ambulatory Visit | Attending: Anesthesiology | Admitting: Anesthesiology

## 2016-05-15 ENCOUNTER — Other Ambulatory Visit: Payer: Self-pay | Admitting: Anesthesiology

## 2016-05-15 DIAGNOSIS — Z79899 Other long term (current) drug therapy: Secondary | ICD-10-CM | POA: Diagnosis not present

## 2016-05-15 DIAGNOSIS — M1611 Unilateral primary osteoarthritis, right hip: Secondary | ICD-10-CM | POA: Diagnosis not present

## 2016-05-15 DIAGNOSIS — M4722 Other spondylosis with radiculopathy, cervical region: Secondary | ICD-10-CM | POA: Diagnosis not present

## 2016-05-15 DIAGNOSIS — M4608 Spinal enthesopathy, sacral and sacrococcygeal region: Secondary | ICD-10-CM | POA: Diagnosis not present

## 2016-05-15 DIAGNOSIS — M25551 Pain in right hip: Secondary | ICD-10-CM

## 2016-05-15 DIAGNOSIS — M62830 Muscle spasm of back: Secondary | ICD-10-CM | POA: Diagnosis not present

## 2016-05-16 ENCOUNTER — Ambulatory Visit (INDEPENDENT_AMBULATORY_CARE_PROVIDER_SITE_OTHER): Payer: Medicare Other | Admitting: Internal Medicine

## 2016-05-16 ENCOUNTER — Other Ambulatory Visit (INDEPENDENT_AMBULATORY_CARE_PROVIDER_SITE_OTHER): Payer: Medicare Other

## 2016-05-16 ENCOUNTER — Encounter: Payer: Self-pay | Admitting: Internal Medicine

## 2016-05-16 VITALS — BP 156/80 | HR 64 | Ht 73.0 in | Wt 189.2 lb

## 2016-05-16 DIAGNOSIS — K580 Irritable bowel syndrome with diarrhea: Secondary | ICD-10-CM

## 2016-05-16 DIAGNOSIS — R1013 Epigastric pain: Secondary | ICD-10-CM

## 2016-05-16 DIAGNOSIS — T86898 Other complications of other transplanted tissue: Secondary | ICD-10-CM

## 2016-05-16 LAB — HEPATIC FUNCTION PANEL
ALT: 10 U/L (ref 0–53)
AST: 20 U/L (ref 0–37)
Albumin: 3.7 g/dL (ref 3.5–5.2)
Alkaline Phosphatase: 85 U/L (ref 39–117)
BILIRUBIN TOTAL: 0.7 mg/dL (ref 0.2–1.2)
Bilirubin, Direct: 0.1 mg/dL (ref 0.0–0.3)
TOTAL PROTEIN: 7.4 g/dL (ref 6.0–8.3)

## 2016-05-16 LAB — LIPASE: LIPASE: 52 U/L (ref 11.0–59.0)

## 2016-05-16 LAB — AMYLASE: AMYLASE: 51 U/L (ref 27–131)

## 2016-05-16 NOTE — Patient Instructions (Signed)
   Your physician has requested that you go to the basement for the following lab work before leaving today: LFT's, Amylase, Lipase   Hold your prilosec and call us back in a couple of weeks and let us know if that helps.   I appreciate the opportunity to care for you. Silvano Rusk, MD, [facility]

## 2016-05-16 NOTE — Progress Notes (Signed)
   [name] [name] 82 y.o. 08/16/1929 [id number]  Assessment & Plan:   Encounter Diagnoses  Name Primary?  . Abdominal pain, epigastric Yes  . Irritable bowel syndrome with diarrhea    Hold omeprazoleSee if he is having side effects causing this epigastric pressure LFT, amylase and lipase, these are checked and normal so I don't think he has recurrent stone issues Continue as he is otherwise if he is not better off omeprazole consider additional therapy. I wonder if pancreatic enzymes might help him and be worth a try.   Subjective:   Chief Complaint: Intermittent diarrhea, gas and epigastric or lower chest pressure  HPI Overall he is better. Still has episodic urgent loose stools at times. In general air soft and controllable. He's been having upper abdominal and maybe lower chest pressure partially relieved with L cheek. It may have begun around the time he started taking Prilosec every day.  Medications, allergies, past medical history, past surgical history, family history and social history are reviewed and updated in the EMR.  Review of Systems As above having right hip pain says hydrocodone was recently prescribed though he hasn't started it. Probably will need hip surgery i.e. replacement I think  Objective:   Physical Exam BP (!) 156/80 (BP Location: Left Arm, Patient Position: Sitting, Cuff Size: Normal)   Pulse 64 Comment: irregular  Ht 6' 1"  (1.854 m)   Wt 189 lb 4 oz (85.8 kg)   BMI 24.97 kg/m  Lungs cta ant Heart s1s2  15 minutes time spent with patient > half in counseling coordination of care

## 2016-05-18 NOTE — Progress Notes (Signed)
Labs are ok Has he noticed any difference off omeprazole (epigastric and lower chest pressure) ?

## 2016-05-19 NOTE — Progress Notes (Signed)
Understand - he is to call back in 2 weeks

## 2016-05-21 DIAGNOSIS — M6281 Muscle weakness (generalized): Secondary | ICD-10-CM | POA: Diagnosis not present

## 2016-05-21 DIAGNOSIS — M542 Cervicalgia: Secondary | ICD-10-CM | POA: Diagnosis not present

## 2016-05-21 DIAGNOSIS — M25511 Pain in right shoulder: Secondary | ICD-10-CM | POA: Diagnosis not present

## 2016-05-21 DIAGNOSIS — R293 Abnormal posture: Secondary | ICD-10-CM | POA: Diagnosis not present

## 2016-05-22 DIAGNOSIS — Z79899 Other long term (current) drug therapy: Secondary | ICD-10-CM | POA: Diagnosis not present

## 2016-05-22 DIAGNOSIS — M4722 Other spondylosis with radiculopathy, cervical region: Secondary | ICD-10-CM | POA: Diagnosis not present

## 2016-05-22 DIAGNOSIS — M4608 Spinal enthesopathy, sacral and sacrococcygeal region: Secondary | ICD-10-CM | POA: Diagnosis not present

## 2016-05-22 DIAGNOSIS — M62830 Muscle spasm of back: Secondary | ICD-10-CM | POA: Diagnosis not present

## 2016-05-23 DIAGNOSIS — M4608 Spinal enthesopathy, sacral and sacrococcygeal region: Secondary | ICD-10-CM | POA: Diagnosis not present

## 2016-05-23 DIAGNOSIS — M25511 Pain in right shoulder: Secondary | ICD-10-CM | POA: Diagnosis not present

## 2016-05-23 DIAGNOSIS — M4722 Other spondylosis with radiculopathy, cervical region: Secondary | ICD-10-CM | POA: Diagnosis not present

## 2016-05-23 DIAGNOSIS — Z79899 Other long term (current) drug therapy: Secondary | ICD-10-CM | POA: Diagnosis not present

## 2016-05-23 DIAGNOSIS — M62830 Muscle spasm of back: Secondary | ICD-10-CM | POA: Diagnosis not present

## 2016-05-27 ENCOUNTER — Telehealth: Payer: Self-pay | Admitting: Internal Medicine

## 2016-05-27 DIAGNOSIS — R071 Chest pain on breathing: Secondary | ICD-10-CM

## 2016-05-27 NOTE — Telephone Encounter (Signed)
Patient reports no improvement off of the Prilosec.  Still having excess belching and upper chest discomfort.  He wants to know what the next is.

## 2016-05-28 DIAGNOSIS — R293 Abnormal posture: Secondary | ICD-10-CM | POA: Diagnosis not present

## 2016-05-28 DIAGNOSIS — M542 Cervicalgia: Secondary | ICD-10-CM | POA: Diagnosis not present

## 2016-05-28 DIAGNOSIS — M25511 Pain in right shoulder: Secondary | ICD-10-CM | POA: Diagnosis not present

## 2016-05-28 DIAGNOSIS — M6281 Muscle weakness (generalized): Secondary | ICD-10-CM | POA: Diagnosis not present

## 2016-05-28 MED ORDER — PANCRELIPASE (LIP-PROT-AMYL) 24000-76000 UNITS PO CPEP: ORAL_CAPSULE | ORAL | 0 refills | Status: DC

## 2016-05-28 NOTE — Telephone Encounter (Signed)
Trial of Zenpep 24K 2 with meals - samples please  Restart omeprazole if he thinks it helps him - I.e. Optional Rx  Let us know if this works - explain we are providing pancreas enzymes to see if that helps him digest food better and relieve sxs

## 2016-05-28 NOTE — Telephone Encounter (Incomplete)
Trial of

## 2016-05-28 NOTE — Telephone Encounter (Signed)
Patient notified of the recommendations.  He will call back with an update after 2 weeks on samples

## 2016-06-09 NOTE — Telephone Encounter (Signed)
Sorry he is not feeling well  Strange that Creon caused diarrhea  Some of his sxs sound GI - some sound chest ? Lungs  See if he will do a PA and lateral CXR today re: pleuritic chest pain and depending on what that shows we weill either reassess him here (APP) vs seeing someone else (did he want to see a different GI MD ?

## 2016-06-09 NOTE — Telephone Encounter (Signed)
Patient notified No he did not want to change GI MDs he was questioning if he needs to see a different specialist.   He will come for xray tomorrow.

## 2016-06-09 NOTE — Telephone Encounter (Signed)
Patient reports that Creon gave him diarrhea and has not been able to tolerate it.  Tried multiple times and diarrhea after taking each time.  He is still having continued excess gas, chest pain relieved by belching.  He has worsening pain when taking in a deep breath. Resuming omeprazole has not relieved his symptoms. "I'm quite uncomfortable and this is annoying".  He questions if he should see someone else?

## 2016-06-10 ENCOUNTER — Other Ambulatory Visit: Payer: Self-pay | Admitting: Internal Medicine

## 2016-06-10 ENCOUNTER — Ambulatory Visit (INDEPENDENT_AMBULATORY_CARE_PROVIDER_SITE_OTHER)
Admission: RE | Admit: 2016-06-10 | Discharge: 2016-06-10 | Disposition: A | Discharge status: Home / Self Care | Payer: Medicare Other | Source: Ambulatory Visit | Attending: Internal Medicine | Admitting: Internal Medicine

## 2016-06-10 DIAGNOSIS — R071 Chest pain on breathing: Secondary | ICD-10-CM | POA: Diagnosis not present

## 2016-06-10 DIAGNOSIS — R079 Chest pain, unspecified: Secondary | ICD-10-CM | POA: Diagnosis not present

## 2016-06-10 NOTE — Progress Notes (Signed)
Called results. Pain is only with deep inspiration  Does not sound GI He will see PCP for eval he will call them for appt

## 2016-06-11 DIAGNOSIS — M25511 Pain in right shoulder: Secondary | ICD-10-CM | POA: Diagnosis not present

## 2016-06-11 DIAGNOSIS — M47812 Spondylosis without myelopathy or radiculopathy, cervical region: Secondary | ICD-10-CM | POA: Diagnosis not present

## 2016-06-11 DIAGNOSIS — R0789 Other chest pain: Secondary | ICD-10-CM | POA: Diagnosis not present

## 2016-06-13 ENCOUNTER — Ambulatory Visit: Payer: Medicare Other | Admitting: Family Medicine

## 2016-06-19 DIAGNOSIS — Z79899 Other long term (current) drug therapy: Secondary | ICD-10-CM | POA: Diagnosis not present

## 2016-06-19 DIAGNOSIS — M25511 Pain in right shoulder: Secondary | ICD-10-CM | POA: Diagnosis not present

## 2016-06-19 DIAGNOSIS — R0789 Other chest pain: Secondary | ICD-10-CM | POA: Diagnosis not present

## 2016-06-19 DIAGNOSIS — M4722 Other spondylosis with radiculopathy, cervical region: Secondary | ICD-10-CM | POA: Diagnosis not present

## 2016-07-14 DIAGNOSIS — L821 Other seborrheic keratosis: Secondary | ICD-10-CM | POA: Diagnosis not present

## 2016-07-14 DIAGNOSIS — L570 Actinic keratosis: Secondary | ICD-10-CM | POA: Diagnosis not present

## 2016-07-14 DIAGNOSIS — Z85828 Personal history of other malignant neoplasm of skin: Secondary | ICD-10-CM | POA: Diagnosis not present

## 2016-07-14 DIAGNOSIS — D1801 Hemangioma of skin and subcutaneous tissue: Secondary | ICD-10-CM | POA: Diagnosis not present

## 2016-07-23 DIAGNOSIS — N401 Enlarged prostate with lower urinary tract symptoms: Secondary | ICD-10-CM | POA: Diagnosis not present

## 2016-07-24 DIAGNOSIS — H02002 Unspecified entropion of right lower eyelid: Secondary | ICD-10-CM | POA: Diagnosis not present

## 2016-07-24 DIAGNOSIS — H02005 Unspecified entropion of left lower eyelid: Secondary | ICD-10-CM | POA: Diagnosis not present

## 2016-07-24 DIAGNOSIS — H2512 Age-related nuclear cataract, left eye: Secondary | ICD-10-CM | POA: Diagnosis not present

## 2016-07-31 DIAGNOSIS — H02005 Unspecified entropion of left lower eyelid: Secondary | ICD-10-CM | POA: Diagnosis not present

## 2016-08-14 DIAGNOSIS — Z79899 Other long term (current) drug therapy: Secondary | ICD-10-CM | POA: Diagnosis not present

## 2016-08-14 DIAGNOSIS — M4722 Other spondylosis with radiculopathy, cervical region: Secondary | ICD-10-CM | POA: Diagnosis not present

## 2016-08-14 DIAGNOSIS — M25511 Pain in right shoulder: Secondary | ICD-10-CM | POA: Diagnosis not present

## 2016-08-14 DIAGNOSIS — R0789 Other chest pain: Secondary | ICD-10-CM | POA: Diagnosis not present

## 2016-08-29 DIAGNOSIS — H2513 Age-related nuclear cataract, bilateral: Secondary | ICD-10-CM | POA: Diagnosis not present

## 2016-09-07 NOTE — Progress Notes (Signed)
[name] [name] Date of Birth: 1929/10/26   History of Present Illness: [name] [name] is seen today for followup s/p MVR. He is status post mitral valve repair with single vessel coronary bypass surgery (due to refractory coronary spasm)  in 2002. Follow up Echo in July 2017 showed a good MV repair. EF was 55%. He had mild aortic stenosis. He also has a history of HTN and PVC/PACs. On follow up today he is doing very well from a cardiac standpoint. No  dyspnea. Notes infrequent chest discomfort relieved with belching. Few palpitations. He still has GI problems with diarrhea.  He is also having a lot of lumbar/ back issues and is followed by pain management. [facility]   Current Outpatient Prescriptions on File Prior to Visit  Medication Sig Dispense Refill  . acetaminophen (TYLENOL) 500 MG tablet Take 1,000 mg by mouth every 6 (six) hours as needed for mild pain.    Marland Kitchen amLODipine (NORVASC) 5 MG tablet Take 1 tablet (5 mg total) by mouth every evening. 90 tablet 3  . amoxicillin (AMOXIL) 500 MG capsule Take 4 capsules by mouth 1 day or 1 dose. 1 hour before dental appt.  0  . aspirin 81 MG tablet Take 81 mg by mouth daily.    . calcium carbonate (TUMS - DOSED IN MG ELEMENTAL CALCIUM) 500 MG chewable tablet Chew 1 tablet by mouth as needed for indigestion or heartburn. Reported on 02/28/2016    . Cholecalciferol (VITAMIN D) 1000 UNITS capsule Take 1,000 Units by mouth daily.      . Cyanocobalamin (B-12 PO) Take 1 tablet by mouth daily.    Marland Kitchen dutasteride (AVODART) 0.5 MG capsule Take 0.5 mg by mouth every evening.     . famotidine (PEPCID) 20 MG tablet Take 20 mg by mouth daily as needed for heartburn or indigestion.    . gabapentin (NEURONTIN) 300 MG capsule Take 300 mg by mouth 4 (four) times daily.    Marland Kitchen HYDROcodone-acetaminophen (NORCO/VICODIN) 5-325 MG tablet Take 1 tablet by mouth every 6 (six) hours.    Marland Kitchen omeprazole (PRILOSEC OTC) 20 MG tablet Take 20 mg by mouth daily.     [name] Glycol-Propyl Glycol (SYSTANE) 0.4-0.3 % SOLN Place 1 drop into both eyes as needed (dry eye).    . rosuvastatin (CRESTOR) 20 MG tablet Take 1 tablet (20 mg total) by mouth daily. 30 tablet 11  . Tamsulosin HCl (FLOMAX) 0.4 MG CAPS Take 0.4 mg by mouth every evening.     . valsartan-hydrochlorothiazide (DIOVAN-HCT) 320-12.5 MG tablet Take 1 tablet by mouth every morning. 90 tablet 2  . Wheat Dextrin (BENEFIBER PO) Take 1 Dose by mouth daily.     No current facility-administered medications on file prior to visit.     No Known Allergies  Past Medical History:  Diagnosis Date  . Arthritis   . BPH (benign prostatic hyperplasia)   . CAD (coronary artery disease)    s/p CABG x 1  . DJD (degenerative joint disease)   . Gallstones   . HTN (hypertension)   . Hyperlipidemia   . Lumbar disc disease   . MVP (mitral valve prolapse)    s/p Mitral Valve Repair  . Pancreatitis   . 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 CHOLECYSTECTOMY     Dr. Lennie Hummer  . COLONOSCOPY  ~  2008   no polyps per pt.  Dr. [name]  . CORONARY ARTERY BYPASS GRAFT  January 2002   Single bypass to the distal RCA  . ERCP N/A 02/22/2013   Procedure: ENDOSCOPIC RETROGRADE CHOLANGIOPANCREATOGRAPHY (ERCP);  Surgeon: [name], MD;  Location: [facility];  Service: Endoscopy;  Laterality: N/A;  . ERCP N/A 09/21/2015   Procedure: ENDOSCOPIC RETROGRADE CHOLANGIOPANCREATOGRAPHY (ERCP);  Surgeon: [name], MD;  Location: [facility] ENDOSCOPY;  Service: Endoscopy;  Laterality: N/A;  with spyglass  . HERNIA REPAIR  05/05/11   Lap L inguinal & obturator herniae, R Femoral Hernia  . MITRAL VALVE REPAIR  January 2002  . SPYGLASS CHOLANGIOSCOPY N/A 02/22/2013   Procedure: JKDTOIZT CHOLANGIOSCOPY;  Surgeon: [name], MD;  Location: [facility];  Service: Endoscopy;   Laterality: N/A;  . SPYGLASS CHOLANGIOSCOPY N/A 09/21/2015   Procedure: IWPYKDXI CHOLANGIOSCOPY;  Surgeon: [name], MD;  Location: [facility];  Service: Endoscopy;  Laterality: N/A;  Bess Kinds LITHOTRIPSY N/A 02/22/2013   Procedure: PJASNKNL LITHOTRIPSY;  Surgeon: [name], MD;  Location: [facility];  Service: Endoscopy;  Laterality: N/A;  litho ord'd 7/8 by DL/PO [id number] [facility]  . UPPER GASTROINTESTINAL ENDOSCOPY  02/22/13    History  Smoking Status  . Never Smoker  Smokeless Tobacco  . Never Used    History  Alcohol Use  . 0.0 oz/week  . 1 - 2 Glasses of wine per week    Comment: rare alcohol use    Family History  Problem Relation Age of Onset  . Stroke Father   . Stroke Mother     TIA    Review of Systems: As noted in history of present illness. All other systems were reviewed and are negative.  Physical Exam: BP (!) 158/76   Pulse (!) 54   Ht 6' 2"  (1.88 m)   Wt 189 lb 3.2 oz (85.8 kg)   BMI 24.29 kg/m  He is a pleasant white male in no acute distress. His HEENT exam is unremarkable. He has no JVD or bruits. Lungs are clear. Cardiac exam reveals a regular rate and rhythm. There is a grade 2/6 harsh systolic murmur in the RUSB radiating to the left carotid and apex. No gallop.   Abdomen is soft and nontender without masses or bruits. He has no feet edema.    LABORATORY DATA: Lab Results  Component Value Date   WBC 6.5 09/20/2015   HGB 12.2 (L) 09/20/2015   HCT 37.7 (L) 09/20/2015   PLT 154 09/20/2015   GLUCOSE 124 (H) 09/22/2015   CHOL 104 10/13/2014   TRIG 85 10/13/2014   HDL 43 10/13/2014   LDLCALC 44 10/13/2014   ALT 10 05/16/2016   AST 20 05/16/2016   NA 141 09/22/2015   K 3.7 09/22/2015   CL 105 09/22/2015   CREATININE 0.90 09/22/2015   BUN 12 09/22/2015   CO2 26 09/22/2015   TSH 2.27 07/14/2013   INR 1.22 09/20/2015   Echo 02/15/16:Study Conclusions  - Left ventricle: The cavity size was mildly dilated. Wall   thickness was  normal. Systolic function was normal. The estimated   ejection fraction was in the range of 50% to 55%. The study is   not technically sufficient to allow evaluation of LV diastolic   function. - Aortic valve: There was very mild stenosis. There was trivial   regurgitation. - Mitral valve: Post MV repair with no significant residual MR. - Left atrium: The atrium  was severely dilated. - Atrial septum: No defect or patent foramen ovale was identified  Assessment / Plan: 1. Mitral insufficiency status post mitral valve repair. Excellent long-term result. Echo in July 2017 showed excellent result.  2. Coronary disease status post single-vessel coronary bypass surgery with a vein graft to the distal RCA due to refractory spasm. He is asymptomatic. Continue ASA to 81 mg daily.  3. Hypertension, [name] pressure is generally well controlled. It is elevated some today but he notes it fluctuates a good bit.   4. Chronic PACs and PVCs. These are asymptomatic now. Wandering atrial pacemaker by prior Ecg (not Afib).

## 2016-09-08 ENCOUNTER — Encounter: Payer: Self-pay | Admitting: Cardiology

## 2016-09-08 ENCOUNTER — Ambulatory Visit (INDEPENDENT_AMBULATORY_CARE_PROVIDER_SITE_OTHER): Payer: Medicare Other | Admitting: Cardiology

## 2016-09-08 VITALS — BP 158/76 | HR 54 | Ht 74.0 in | Wt 189.2 lb

## 2016-09-08 DIAGNOSIS — I491 Atrial premature depolarization: Secondary | ICD-10-CM | POA: Diagnosis not present

## 2016-09-08 DIAGNOSIS — I25810 Atherosclerosis of coronary artery bypass graft(s) without angina pectoris: Secondary | ICD-10-CM

## 2016-09-08 DIAGNOSIS — I493 Ventricular premature depolarization: Secondary | ICD-10-CM

## 2016-09-08 DIAGNOSIS — I10 Essential (primary) hypertension: Secondary | ICD-10-CM | POA: Diagnosis not present

## 2016-09-08 DIAGNOSIS — E7800 Pure hypercholesterolemia, unspecified: Secondary | ICD-10-CM

## 2016-09-08 NOTE — Patient Instructions (Addendum)
Continue your current therapy  I will see you in 6 months.   

## 2016-09-08 NOTE — Addendum Note (Signed)
Addended by: Kathyrn Lass on: 09/08/2016 12:09 PM   Modules accepted: Orders

## 2016-09-10 ENCOUNTER — Other Ambulatory Visit: Payer: Medicare Other | Admitting: *Deleted

## 2016-09-10 DIAGNOSIS — I493 Ventricular premature depolarization: Secondary | ICD-10-CM | POA: Diagnosis not present

## 2016-09-10 DIAGNOSIS — E7800 Pure hypercholesterolemia, unspecified: Secondary | ICD-10-CM | POA: Diagnosis not present

## 2016-09-10 DIAGNOSIS — I25810 Atherosclerosis of coronary artery bypass graft(s) without angina pectoris: Secondary | ICD-10-CM

## 2016-09-10 DIAGNOSIS — I10 Essential (primary) hypertension: Secondary | ICD-10-CM | POA: Diagnosis not present

## 2016-09-10 DIAGNOSIS — I491 Atrial premature depolarization: Secondary | ICD-10-CM

## 2016-09-11 LAB — HEPATIC FUNCTION PANEL
ALBUMIN: 3.8 g/dL (ref 3.5–4.7)
ALT: 12 IU/L (ref 0–44)
AST: 20 IU/L (ref 0–40)
Alkaline Phosphatase: 93 IU/L (ref 39–117)
BILIRUBIN TOTAL: 0.8 mg/dL (ref 0.0–1.2)
BILIRUBIN, DIRECT: 0.26 mg/dL (ref 0.00–0.40)
TOTAL PROTEIN: 6.7 g/dL (ref 6.0–8.5)

## 2016-09-11 LAB — CBC WITH DIFFERENTIAL/PLATELET
BASOS: 0 %
Basophils Absolute: 0 10*3/uL (ref 0.0–0.2)
EOS (ABSOLUTE): 0.1 10*3/uL (ref 0.0–0.4)
EOS: 2 %
HEMATOCRIT: 39.6 % (ref 37.5–51.0)
HEMOGLOBIN: 13.3 g/dL (ref 13.0–17.7)
IMMATURE GRANS (ABS): 0 10*3/uL (ref 0.0–0.1)
Immature Granulocytes: 0 %
LYMPHS ABS: 1.3 10*3/uL (ref 0.7–3.1)
LYMPHS: 19 %
MCH: 28.4 pg (ref 26.6–33.0)
MCHC: 33.6 g/dL (ref 31.5–35.7)
MCV: 85 fL (ref 79–97)
Monocytes Absolute: 0.6 10*3/uL (ref 0.1–0.9)
Monocytes: 8 %
NEUTROS ABS: 4.9 10*3/uL (ref 1.4–7.0)
Neutrophils: 71 %
Platelets: 254 10*3/uL (ref 150–379)
RBC: 4.68 x10E6/uL (ref 4.14–5.80)
RDW: 14.4 % (ref 12.3–15.4)
WBC: 7 10*3/uL (ref 3.4–10.8)

## 2016-09-11 LAB — BASIC METABOLIC PANEL
BUN / CREAT RATIO: 15 (ref 10–24)
BUN: 13 mg/dL (ref 8–27)
CO2: 27 mmol/L (ref 18–29)
Calcium: 8.8 mg/dL (ref 8.6–10.2)
Chloride: 98 mmol/L (ref 96–106)
Creatinine, Ser: 0.88 mg/dL (ref 0.76–1.27)
GFR, EST AFRICAN AMERICAN: 91 mL/min/{1.73_m2} (ref >59)
GFR, EST NON AFRICAN AMERICAN: 79 mL/min/{1.73_m2} (ref >59)
Glucose: 93 mg/dL (ref 65–99)
POTASSIUM: 3.8 mmol/L (ref 3.5–5.2)
SODIUM: 140 mmol/L (ref 134–144)

## 2016-09-11 LAB — LIPID PANEL
Chol/HDL Ratio: 2.6 ratio units (ref 0.0–5.0)
Cholesterol, Total: 101 mg/dL (ref 100–199)
HDL: 39 mg/dL — ABNORMAL LOW (ref >39)
LDL CALC: 45 mg/dL (ref 0–99)
TRIGLYCERIDES: 83 mg/dL (ref 0–149)
VLDL Cholesterol Cal: 17 mg/dL (ref 5–40)

## 2016-09-15 ENCOUNTER — Other Ambulatory Visit: Payer: Self-pay | Admitting: Family Medicine

## 2016-11-11 DIAGNOSIS — Z79899 Other long term (current) drug therapy: Secondary | ICD-10-CM | POA: Diagnosis not present

## 2016-11-11 DIAGNOSIS — M4722 Other spondylosis with radiculopathy, cervical region: Secondary | ICD-10-CM | POA: Diagnosis not present

## 2016-11-11 DIAGNOSIS — M25511 Pain in right shoulder: Secondary | ICD-10-CM | POA: Diagnosis not present

## 2016-11-11 DIAGNOSIS — R0789 Other chest pain: Secondary | ICD-10-CM | POA: Diagnosis not present

## 2016-12-17 ENCOUNTER — Telehealth: Payer: Self-pay | Admitting: Internal Medicine

## 2016-12-17 NOTE — Telephone Encounter (Signed)
Patient notified

## 2016-12-17 NOTE — Telephone Encounter (Signed)
Patient reports that he has 3 weeks of back, chest , and should pain.  He reports that the pain moves around but it is worse with a deep breath.  He reports that the pain is always there position changes or activity do not relieve nor aggravate.  He feels that this is "gas" related. He has not been taking Prilosec until the last 3 days and he is taking it daily along with gas-x.  Nothing is really relieving the pain including hydrocodone.  He denies fever, a cough, or upper respiratory symptoms as well.  He has been caring for his wife that had surgery last week and wonders if some stress could be a factor.  He is mostly concerned because he reports that the pain across his back and shoulders "really hurts" and it is not getting better.  He has not tried the Pepcid on his list.  He is having regular BM no diarrhea or constipation.  He would like your opinion.

## 2016-12-17 NOTE — Telephone Encounter (Signed)
I looked over his chart again He had something similar before and CXR was negative  Since pain worse with a deep breath sounds chest wall or lung in origin  Certainly could be arthritis also  Looks like he had a good checkup w/ Dr. [place] cardiology in Jan  Stress may make this bother him more as he indicated  I think it would be worth getting checked by his PCP to see if any other tests needed and to try to find some relief

## 2016-12-18 ENCOUNTER — Ambulatory Visit (INDEPENDENT_AMBULATORY_CARE_PROVIDER_SITE_OTHER): Payer: Medicare Other | Admitting: Family Medicine

## 2016-12-18 ENCOUNTER — Other Ambulatory Visit: Payer: Self-pay

## 2016-12-18 ENCOUNTER — Encounter: Payer: Self-pay | Admitting: Family Medicine

## 2016-12-18 VITALS — BP 140/90 | HR 95 | Wt 183.0 lb

## 2016-12-18 DIAGNOSIS — M25511 Pain in right shoulder: Secondary | ICD-10-CM | POA: Diagnosis not present

## 2016-12-18 MED ORDER — METAXALONE 800 MG PO TABS: 800.0000 mg | ORAL_TABLET | Freq: Three times a day (TID) | ORAL | 0 refills | Status: DC

## 2016-12-18 NOTE — Progress Notes (Signed)
   Subjective:    Patient ID: [name] [name], male    DOB: 02/04/1930, 83 y.o.   MRN: [id number]  HPI He is here for consultation for evaluation of a one-week history of right upper back pain and spasm. He notes that when he turns his head to the left, the pain will increase. Also hitting a bump in the road will make this more uncomfortable. He has had difficulty with this in the past on 2 other occasions and did get physical therapy as well as dry needling which apparently did help. He is interested in trying something other than the dry needling. He is also scheduled to see Dr. Ninfa Linden for further evaluation of his hip pain.   Review of Systems     Objective:   Physical Exam Alert and in no distress. Trigger points noted in the trapezius. Normal motor, sensory and DTRs. Good motion of his neck.       Assessment & Plan:  Trigger point of right shoulder region - Plan: metaxalone (SKELAXIN) 800 MG tablet I will treat him initially with heat for 20 minutes 3 times per day and gentle stretching. Will also give Skelaxin at his request. If continued difficulty we'll refer to a different physical therapist

## 2016-12-18 NOTE — Patient Instructions (Signed)
Heat for 20 minutes 3 times per day and stretching after that. You can use the Skelaxin as needed

## 2016-12-22 ENCOUNTER — Ambulatory Visit (INDEPENDENT_AMBULATORY_CARE_PROVIDER_SITE_OTHER): Payer: Medicare Other | Admitting: Orthopaedic Surgery

## 2016-12-22 ENCOUNTER — Other Ambulatory Visit (INDEPENDENT_AMBULATORY_CARE_PROVIDER_SITE_OTHER): Payer: Self-pay

## 2016-12-22 ENCOUNTER — Ambulatory Visit (INDEPENDENT_AMBULATORY_CARE_PROVIDER_SITE_OTHER): Payer: Medicare Other

## 2016-12-22 DIAGNOSIS — M25551 Pain in right hip: Secondary | ICD-10-CM | POA: Diagnosis not present

## 2016-12-22 MED ORDER — METHYLPREDNISOLONE ACETATE 40 MG/ML IJ SUSP
40.0000 mg | INTRAMUSCULAR | Status: AC | PRN
  Administered 2016-12-22: 40 mg via INTRA_ARTICULAR

## 2016-12-22 MED ORDER — LIDOCAINE HCL 1 % IJ SOLN
3.0000 mL | INTRAMUSCULAR | Status: AC | PRN
  Administered 2016-12-22: 3 mL

## 2016-12-22 NOTE — Progress Notes (Signed)
Office Visit Note   Patient: [name] [name]           Date of Birth: 11-Nov-1929           MRN: [id number] Visit Date: 12/22/2016              Requested by: [name], MD [place], [place] [address] PCP: [name], MD   Assessment & Plan: Visit Diagnoses:  1. Pain in right hip     Plan: I think his pain is multifactorial. I think there is a combination of sciatica on the right side as well as is moderate arthritis in the right hip. I tried an injection around the trochanteric area and just a trigger point injection in the back of his pelvis but he really benefit from seeing Dr. Ernestina Patches and he at least needs an intra-articular injection in his right hip joint but I feel that most of his problems is coming from the stenosis of the foramina of the right L5-S1 area. He may best benefit from an epidural steroid in this area. We'll see him back myself in 4 weeks but hopefully that her multiple that intervention by Dr. Ernestina Patches. All questions were encouraged and answered and he feels comfortable with this as well.  Follow-Up Instructions: Return in about 4 weeks (around 01/19/2017).   Orders:  Orders Placed This Encounter  Procedures  . Large Joint Injection/Arthrocentesis  . XR HIP UNILAT W OR W/O PELVIS 1V RIGHT   No orders of the defined types were placed in this encounter.     Procedures: Large Joint Inj Date/Time: 12/22/2016 2:59 PM Performed by: Mcarthur Rossetti Authorized by: Mcarthur Rossetti   Location:  Hip Site:  R greater trochanter Ultrasound Guidance: No   Fluoroscopic Guidance: No   Arthrogram: No   Medications:  3 mL lidocaine 1 %; 40 mg methylPREDNISolone acetate 40 MG/ML     Clinical Data: No additional findings.   Subjective: No chief complaint on file. Is a very pleasant 83 year old who comes in his patient Dr. Jill Alexanders to evaluate right hip pain. He points to the pelvis area as well as lateral of the source  of his pain. He has known L5-S1 right-sided severe foraminal stenosis for an MRI in 2016. His pain is gotten worse her last few weeks is been hurting for several months. He's been upping take care of a wife who is had some significant medical problems that a lot of stress on him. He is always an active individual. He denies any pain in his groin. He denies atingling in the right foot either. Of note he did have a discitis and was seen by infectious disease years ago and was on IV antibiotics for that.  HPI  Review of Systems Currently denies any headache, nausea, vomiting, fever, chills, shortness of breath or chest pain.  Objective: Vital Signs: There were no vitals taken for this visit.  Physical Exam He is alert and 3 and in no acute distress. He is a very hard time getting up on the exam table. Ortho Exam On examination he is a significantly positive straight leg raise on the right side but now left. I can gently put his hip through internal rotation rotation there is some pain in the groin that a lot of pain over the IT band the trochanteric area and a segment pelvis. Specialty Comments:  No specialty comments available.  Imaging: Xr Hip Unilat W Or W/o Pelvis 1v Right  Result Date: 12/22/2016 An AP pelvis and lateral of his right hip shows mild-to-moderate arthritic changes in the right hip. There is significant arthritis in his SI joint.    PMFS History: Patient Active Problem List   Diagnosis Date Noted  . Transaminitis 09/19/2015  . Abdominal pain 09/19/2015  . Bloating 12/14/2014  . Upper abdominal pain 12/14/2014  . Peripheral neuropathy 10/13/2014  . Spinal stenosis of lumbar region 10/13/2014  . Wandering (atrial) pacemaker 11/22/2013  . Calculus of bile duct without mention of cholecystitis or obstruction 02/22/2013  . PAC (premature atrial contraction) 11/17/2012  . Arthritis 09/30/2011  . Left obturator hernia 05/21/2011  . Right femoral hernia 05/21/2011  .  Left inguinal hernia 04/10/2011  . DOE (dyspnea on exertion) 11/06/2010  . PVC's (premature ventricular contractions) 11/06/2010  . S/P mitral valve repair 11/06/2010  . Fatigue 11/06/2010  . Cough 11/06/2010  . CAD (coronary artery disease)   . HTN (hypertension)   . Hyperlipidemia    Past Medical History:  Diagnosis Date  . Arthritis   . BPH (benign prostatic hyperplasia)   . CAD (coronary artery disease)    s/p CABG x 1  . DJD (degenerative joint disease)   . Gallstones   . HTN (hypertension)   . Hyperlipidemia   . Lumbar disc disease   . MVP (mitral valve prolapse)    s/p Mitral Valve Repair  . Pancreatitis   . Wandering (atrial) pacemaker 11/22/2013   with frequent multifocal ectopy (PACs, PVCs, junctional escape beats    Family History  Problem Relation Age of Onset  . Stroke Father   . Stroke Mother        TIA    Past Surgical History:  Procedure Laterality Date  . CHOLECYSTECTOMY     Dr. Lennie Hummer  . COLONOSCOPY  ~2008   no polyps per pt.  Dr. [name]  . CORONARY ARTERY BYPASS GRAFT  January 2002   Single bypass to the distal RCA  . ERCP N/A 02/22/2013   Procedure: ENDOSCOPIC RETROGRADE CHOLANGIOPANCREATOGRAPHY (ERCP);  Surgeon: [name], MD;  Location: [facility];  Service: Endoscopy;  Laterality: N/A;  . ERCP N/A 09/21/2015   Procedure: ENDOSCOPIC RETROGRADE CHOLANGIOPANCREATOGRAPHY (ERCP);  Surgeon: [name], MD;  Location: [facility];  Service: Endoscopy;  Laterality: N/A;  with spyglass  . HERNIA REPAIR  05/05/11   Lap L inguinal & obturator herniae, R Femoral Hernia  . MITRAL VALVE REPAIR  January 2002  . SPYGLASS CHOLANGIOSCOPY N/A 02/22/2013   Procedure: [id number] CHOLANGIOSCOPY;  Surgeon: [name], MD;  Location: [facility];  Service: Endoscopy;  Laterality: N/A;  . SPYGLASS CHOLANGIOSCOPY N/A 09/21/2015   Procedure: GNFAOZHY CHOLANGIOSCOPY;  Surgeon: [name], MD;  Location: [facility];  Service: Endoscopy;   Laterality: N/A;  Bess Kinds LITHOTRIPSY N/A 02/22/2013   Procedure: QMVHQION LITHOTRIPSY;  Surgeon: [name], MD;  Location: [facility];  Service: Endoscopy;  Laterality: N/A;  litho ord'd 7/8 by DL/PO [id number] [facility]  . UPPER GASTROINTESTINAL ENDOSCOPY  02/22/13   Social History   Occupational History  . Retired     Scientist, research (physical sciences)  .  Retired   Social History Main Topics  . Smoking status: Never Smoker  . Smokeless tobacco: Never Used  . Alcohol use 0.0 oz/week    1 - 2 Glasses of wine per week     Comment: rare alcohol use  . Drug use: No  . Sexual activity: Not on file

## 2016-12-31 ENCOUNTER — Encounter (INDEPENDENT_AMBULATORY_CARE_PROVIDER_SITE_OTHER): Payer: Self-pay | Admitting: Physical Medicine and Rehabilitation

## 2016-12-31 ENCOUNTER — Ambulatory Visit (INDEPENDENT_AMBULATORY_CARE_PROVIDER_SITE_OTHER): Payer: Medicare Other | Admitting: Physical Medicine and Rehabilitation

## 2016-12-31 VITALS — BP 163/86

## 2016-12-31 DIAGNOSIS — M25551 Pain in right hip: Secondary | ICD-10-CM

## 2016-12-31 DIAGNOSIS — M48062 Spinal stenosis, lumbar region with neurogenic claudication: Secondary | ICD-10-CM

## 2016-12-31 DIAGNOSIS — M5416 Radiculopathy, lumbar region: Secondary | ICD-10-CM | POA: Diagnosis not present

## 2016-12-31 NOTE — Progress Notes (Deleted)
Pain in buttocks with walking. Worse on right side.Occasional sharp pains on right side. States he never knows when its going to hit and it is a 10 plus. Doesn't relate to any certain movement or position.  Pain into right thigh. Denies groin pain.

## 2017-01-01 ENCOUNTER — Encounter (INDEPENDENT_AMBULATORY_CARE_PROVIDER_SITE_OTHER): Payer: Self-pay | Admitting: Physical Medicine and Rehabilitation

## 2017-01-01 DIAGNOSIS — M25551 Pain in right hip: Secondary | ICD-10-CM | POA: Insufficient documentation

## 2017-01-01 NOTE — Progress Notes (Signed)
[name] [name] - 83 y.o. male MRN [id number]  Date of birth: May 16, 1930  Office Visit Note: Visit Date: 12/31/2016 PCP: [name] Lung, MD Referred by: [name] Lung, MD  Subjective: Chief Complaint  Patient presents with  . Lower Back - Pain  . Right Hip - Pain   HPI: [name] is an 83 year old gentleman who comes in today at the request of Dr. Ninfa [name] in our office to evaluate his hip pain. By way of quick review to the patient's comments today in the record he is under the care of Dr. Hardin [name] at [facility] pain management. Dr. Hardin [name] had felt like a lot of his pain was emanating from his hip. He was seen by Dr. Ninfa [name] who examined his hip in the images available and felt like the symptoms he was having may be more related to his lumbar spine. Dr. Ninfa [name] did complete diagnostic injections of the greater trochanter and iliotibial band without much relief according to the patient. Images of the hip do show moderate arthritis. The patient does have 2 MRIs from 2016 1 is reviewed below. The other he had today as a report from [place]. He had a history of 2016 of possible discitis at L2-3. This has resolved. He saw Dr. Melina [name] at [place]. Once the discitis had resolved Dr. Rolena [name] referred him to Dr. Hardin [name] for management of symptoms. He continues to take a small amount of hydrocodone along with gabapentin. His case is complicated by coronary artery disease and hypertension. He is not on blood thinner. His complaints today are centered around to things mainly. He first complains of a very sharp 10 out of 10 pain that radiates from the right buttock to the right lateral hip and thigh. This can be again sharp excruciating pain that doesn't last very long at a can make him almost paralyzed when he hits. He does not know any specific position or time of day or any of the events that will bring this on. He has not had this since Sunday but he does randomly get  this pain. He also reports a pain in the buttocks with walking consistent with a neurogenic claudication. Again more symptoms on the right than left. He denies any pain into the groin. He does state he has difficulty getting out of a car. He reports no spinal injections recently but does state that Dr. Hardin [name] and mentioned the possibility. He denies any numbness tingling or paresthesias but does carry a diagnosis of peripheral polyneuropathy. He denies any focal weakness. He's had no new trauma.  I have actually seen [name] in the remote past.    Review of Systems  Constitutional: Negative for chills, fever, malaise/fatigue and weight loss.  HENT: Negative for hearing loss and sinus pain.   Eyes: Negative for blurred vision, double vision and photophobia.  Respiratory: Negative for cough and shortness of breath.   Cardiovascular: Negative for chest pain, palpitations and leg swelling.  Gastrointestinal: Negative for abdominal pain, nausea and vomiting.  Genitourinary: Negative for flank pain.  Musculoskeletal: Positive for back pain. Negative for myalgias.  Skin: Negative for itching and rash.  Neurological: Negative for tremors, focal weakness and weakness.  Endo/Heme/Allergies: Negative.   Psychiatric/Behavioral: Negative for depression.  All other systems reviewed and are negative.  Otherwise per HPI.  Assessment & Plan: Visit Diagnoses:  1. Pain in right hip   2. Lumbar radiculopathy   3. Spinal stenosis of lumbar region with neurogenic claudication  Plan: Findings:  Chronic worsening low back pain with bilateral buttocks pain consistent with neurogenic claudication from multifactorial stenosis at L4-5 and possibly L5-S1. He gets a sharp shooting pain that is randomly presents with severe. This does seem consistent with an L5 radicular-type pain is likely from the L5 foraminal stenosis and facet arthropathy. Some of his back pain is facet mediated pain. A lot of his pain is  multifactorial but I don't think this is related to his hip. Dr. Ninfa [name] had discussed with him hip injection diagnostically and that's mainly while seeing him today. I really do not think after extended conversation with the patient that a hip injection is going to be beneficial or worthwhile because his symptoms are so random him not sure we will be able to diagnose anything even if we did the injection. I do think he might benefit from a right L5 transforaminal epidural injection or injection for the stenosis for essentially two different pain generators. I've asked him to call Dr. Hardin [name] and see if Dr. Hardin [name] feels that may be a reasonable next step and obviously Dr. Hardin [name] could do that injection very well. I have explained to the patient that he was referred to Dr. Ninfa [name] really to look at his hip and then Dr. Ninfa [name] asked me to evaluate just to see what I thought as well and I do think this is more spine related than hip related.    Meds & Orders: No orders of the defined types were placed in this encounter.  No orders of the defined types were placed in this encounter.   Follow-up: Return if symptoms worsen or fail to improve, for Dr. Ninfa [name] and Dr. Hardin [name].   Procedures: No procedures performed  No notes on file   Clinical History: Lumbar spine MRI 03/29/2015 1 L1-2: As above, new endplate changes on the left with left paraspinal inflammatory changes. No focal fluid collection or evidence of discitis osteomyelitis. There is mild disc bulging, but no spinal stenosis or definite nerve root encroachment.  L2-3: As above, interval resolution of endplate edema and enhancement. No evidence of ongoing discitis or osteomyelitis. There is chronic loss of disc height with annular disc bulging and paraspinal osteophytes contributing to mild narrowing of the lateral recesses and foramina bilaterally.  L3-4: Mild disc bulging with anterior osteophytes and mild facet/ ligamentous  hypertrophy. No significant spinal stenosis or nerve root encroachment.  L4-5: Discal T2 hyperintensity and enhancement have increased from the prior study, although there is no adjacent endplate abnormality. There is chronic loss of disc height with paraspinal osteophytes, facet and ligamentous hypertrophy contributing to moderate multifactorial spinal stenosis. Mild narrowing of the foramina appears unchanged.  L5-S1: Stable chronic degenerative disc disease with paraspinal osteophytes asymmetric to the right, facet and ligamentous hypertrophy. The resulting mild to moderate spinal stenosis and severe right foraminal narrowing are unchanged.  IMPRESSION: 1. Compared with the prior study from last year, the disc and endplate findings at Z6-1 have significantly improved. There is no evidence of ongoing discitis or osteomyelitis. 2. New endplate and paraspinal changes on the left at L1-2, likely inflammatory, but without specific signs of infection. Chronic changes near the right T12 costovertebral junction are similar. 3. Generally stable multilevel spondylosis, most advanced at L4-5 where there is moderate multifactorial spinal stenosis. 4. Stable severe right foraminal narrowing at L5-S1.  He reports that he has never smoked. He has never used smokeless tobacco. No results for input(s): HGBA1C, LABURIC in the last 8760 hours.  Objective:  VS:  HT:    WT:   BMI:     BP:(!) 163/86  HR: bpm  TEMP: ( )  RESP:  Physical Exam  Constitutional: He is oriented to person, place, and time. He appears well-developed and well-nourished. No distress.  HENT:  Head: Normocephalic and atraumatic.  Eyes: Conjunctivae are normal. Pupils are equal, round, and reactive to light.  Neck: Normal range of motion. Neck supple.  Cardiovascular: Regular rhythm and intact distal pulses.   Pulmonary/Chest: Effort normal. No respiratory distress.  Musculoskeletal:  Patient is somewhat slow to arise  to a standing position from a seated position. He has concordant low back pain with extension rotation. He has mild tenderness over the right greater trochanter but not exquisite tenderness but it is worse than the left. He has absolutely no pain with hip rotation. The very end range of internal rotation he gets a small twinge. His hips move fairly freely. He has good distal strength and symmetric bilaterally and there is no clonus bilaterally. Slump test is negative bilaterally.  Neurological: He is alert and oriented to person, place, and time. He exhibits normal muscle tone.  Skin: Skin is warm and dry. No rash noted. No erythema.  Psychiatric: He has a normal mood and affect.  Nursing note and vitals reviewed.   Ortho Exam Imaging: No results found.  Past Medical/Family/Surgical/Social History: Medications & Allergies reviewed per EMR Patient Active Problem List   Diagnosis Date Noted  . Pain in right hip 01/01/2017  . Transaminitis 09/19/2015  . Abdominal pain 09/19/2015  . Bloating 12/14/2014  . Upper abdominal pain 12/14/2014  . Peripheral neuropathy 10/13/2014  . Spinal stenosis of lumbar region 10/13/2014  . Wandering (atrial) pacemaker 11/22/2013  . Calculus of bile duct without mention of cholecystitis or obstruction 02/22/2013  . PAC (premature atrial contraction) 11/17/2012  . Arthritis 09/30/2011  . Left obturator hernia 05/21/2011  . Right femoral hernia 05/21/2011  . Left inguinal hernia 04/10/2011  . DOE (dyspnea on exertion) 11/06/2010  . PVC's (premature ventricular contractions) 11/06/2010  . S/P mitral valve repair 11/06/2010  . Fatigue 11/06/2010  . Cough 11/06/2010  . CAD (coronary artery disease)   . HTN (hypertension)   . Hyperlipidemia    Past Medical History:  Diagnosis Date  . Arthritis   . BPH (benign prostatic hyperplasia)   . CAD (coronary artery disease)    s/p CABG x 1  . DJD (degenerative joint disease)   . Gallstones   . HTN  (hypertension)   . Hyperlipidemia   . Lumbar disc disease   . MVP (mitral valve prolapse)    s/p Mitral Valve Repair  . Pancreatitis   . Wandering (atrial) pacemaker 11/22/2013   with frequent multifocal ectopy (PACs, PVCs, junctional escape beats   Family History  Problem Relation Age of Onset  . Stroke Father   . Stroke Mother        TIA   Past Surgical History:  Procedure Laterality Date  . CHOLECYSTECTOMY     Dr. Lennie Hummer  . COLONOSCOPY  ~2008   no polyps per pt.  Dr. [name]  . CORONARY ARTERY BYPASS GRAFT  January 2002   Single bypass to the distal RCA  . ERCP N/A 02/22/2013   Procedure: ENDOSCOPIC RETROGRADE CHOLANGIOPANCREATOGRAPHY (ERCP);  Surgeon: [name], MD;  Location: [facility];  Service: Endoscopy;  Laterality: N/A;  . ERCP N/A 09/21/2015   Procedure: ENDOSCOPIC RETROGRADE CHOLANGIOPANCREATOGRAPHY (ERCP);  Surgeon: Ofilia Neas  Carlean Purl, MD;  Location: [facility];  Service: Endoscopy;  Laterality: N/A;  with spyglass  . HERNIA REPAIR  05/05/11   Lap L inguinal & obturator herniae, R Femoral Hernia  . MITRAL VALVE REPAIR  January 2002  . SPYGLASS CHOLANGIOSCOPY N/A 02/22/2013   Procedure: [id number] CHOLANGIOSCOPY;  Surgeon: [name], MD;  Location: [facility];  Service: Endoscopy;  Laterality: N/A;  . SPYGLASS CHOLANGIOSCOPY N/A 09/21/2015   Procedure: [id number] CHOLANGIOSCOPY;  Surgeon: [name], MD;  Location: [facility];  Service: Endoscopy;  Laterality: N/A;  Bess Kinds LITHOTRIPSY N/A 02/22/2013   Procedure: [id number] LITHOTRIPSY;  Surgeon: [name], MD;  Location: [facility];  Service: Endoscopy;  Laterality: N/A;  litho ord'd 7/8 by DL/PO [id number] [facility]  . UPPER GASTROINTESTINAL ENDOSCOPY  02/22/13   Social History   Occupational History  . Retired     Scientist, research (physical sciences)  .  Retired   Social History Main Topics  . Smoking status: Never Smoker  . Smokeless tobacco: Never Used  . Alcohol use 0.0 oz/week    1 - 2 Glasses of  wine per week     Comment: rare alcohol use  . Drug use: No  . Sexual activity: Not on file

## 2017-01-02 ENCOUNTER — Other Ambulatory Visit: Payer: Self-pay | Admitting: Anesthesiology

## 2017-01-02 ENCOUNTER — Ambulatory Visit
Admission: RE | Admit: 2017-01-02 | Discharge: 2017-01-02 | Disposition: A | Discharge status: Home / Self Care | Payer: Medicare Other | Source: Ambulatory Visit | Attending: Anesthesiology | Admitting: Anesthesiology

## 2017-01-02 DIAGNOSIS — G8929 Other chronic pain: Secondary | ICD-10-CM

## 2017-01-02 DIAGNOSIS — M533 Sacrococcygeal disorders, not elsewhere classified: Secondary | ICD-10-CM | POA: Diagnosis not present

## 2017-01-02 DIAGNOSIS — Z79899 Other long term (current) drug therapy: Secondary | ICD-10-CM | POA: Diagnosis not present

## 2017-01-02 DIAGNOSIS — M4722 Other spondylosis with radiculopathy, cervical region: Secondary | ICD-10-CM | POA: Diagnosis not present

## 2017-01-02 DIAGNOSIS — M25511 Pain in right shoulder: Secondary | ICD-10-CM | POA: Diagnosis not present

## 2017-01-12 ENCOUNTER — Other Ambulatory Visit: Payer: Self-pay | Admitting: Cardiology

## 2017-01-12 DIAGNOSIS — Z85828 Personal history of other malignant neoplasm of skin: Secondary | ICD-10-CM | POA: Diagnosis not present

## 2017-01-12 DIAGNOSIS — D1801 Hemangioma of skin and subcutaneous tissue: Secondary | ICD-10-CM | POA: Diagnosis not present

## 2017-01-12 DIAGNOSIS — D2271 Melanocytic nevi of right lower limb, including hip: Secondary | ICD-10-CM | POA: Diagnosis not present

## 2017-01-12 DIAGNOSIS — L570 Actinic keratosis: Secondary | ICD-10-CM | POA: Diagnosis not present

## 2017-01-12 DIAGNOSIS — L821 Other seborrheic keratosis: Secondary | ICD-10-CM | POA: Diagnosis not present

## 2017-01-13 ENCOUNTER — Other Ambulatory Visit: Payer: Self-pay | Admitting: Physical Medicine and Rehabilitation

## 2017-01-13 DIAGNOSIS — M25551 Pain in right hip: Secondary | ICD-10-CM

## 2017-01-18 ENCOUNTER — Ambulatory Visit
Admission: RE | Admit: 2017-01-18 | Discharge: 2017-01-18 | Disposition: A | Discharge status: Home / Self Care | Payer: Medicare Other | Source: Ambulatory Visit | Attending: Physical Medicine and Rehabilitation | Admitting: Physical Medicine and Rehabilitation

## 2017-01-18 DIAGNOSIS — M25551 Pain in right hip: Secondary | ICD-10-CM

## 2017-01-26 ENCOUNTER — Ambulatory Visit (INDEPENDENT_AMBULATORY_CARE_PROVIDER_SITE_OTHER): Payer: Medicare Other | Admitting: Orthopaedic Surgery

## 2017-01-26 DIAGNOSIS — M25551 Pain in right hip: Secondary | ICD-10-CM | POA: Diagnosis not present

## 2017-01-26 NOTE — Progress Notes (Signed)
The patient is continue to follow-up with right sided hip pain and low back pain. He points the to the posterior aspect of his pelvis and the SI joint as well as the issue areas a source of his pain. He still denies any groin pain. He has since had an MRI of his right hip because plain films done at another office suggested avascular necrosis. I reviewed are plain films again I saw no evidence of avascular necrosis and a well maintained hip joint. I do have the MRI for my review today.  On examination of his right hip I can easily put his hip to internal rotation rotation and no pain in the groin. His pain seems to be the posterior aspect of his pelvis around the SI joint issue as well. MRI does show moderate arthritis of the right hip and no evidence of avascular necrosis or fracture. The patient is concerned that he gets occasional sharp pains that are debilitating but he hasn't had one in over week. He still never had any type of intervention in terms of injection in this area. I think he did see Dr. Ernestina Patches in our office though.  He sees Dr. Hardin Negus this Friday. He'll show the MRI report to him he is welcome to call us to see if he would like to have an intervention to Dr. Ernestina Patches.

## 2017-01-27 ENCOUNTER — Ambulatory Visit (INDEPENDENT_AMBULATORY_CARE_PROVIDER_SITE_OTHER): Payer: Medicare Other | Admitting: Family Medicine

## 2017-01-27 VITALS — BP 110/64 | HR 57 | Temp 97.9°F | Wt 180.2 lb

## 2017-01-27 DIAGNOSIS — M25511 Pain in right shoulder: Secondary | ICD-10-CM | POA: Diagnosis not present

## 2017-01-27 DIAGNOSIS — M1990 Unspecified osteoarthritis, unspecified site: Secondary | ICD-10-CM | POA: Diagnosis not present

## 2017-01-27 NOTE — Progress Notes (Signed)
   Subjective:    Patient ID: [name] [name], male    DOB: Feb 22, 1930, 83 y.o.   MRN: [id number]  HPI He is having difficulty with neck and upper back pain. He can wake up in the morning and feels some stiffness but does tend to get better but then in the late afternoon he notes the pain does get worse again. He has been under last stress dealing with his own medical issues as well as his wife. He has no numbness tingling or weakness. He does have difficulty with low back pain and is in the process of this being taken care by orthopedics as well as pain management.   Review of Systems     Objective:   Physical Exam Alert and in no distress. Pain on motion of the neck with fairly good motion. Normal motor sensory and DTRs. No palpable tenderness to his neck or trapezius area.       Assessment & Plan:  Arthritis - Plan: Ambulatory referral to Physical Therapy  Right shoulder pain, unspecified chronicity - Plan: Ambulatory referral to Physical Therapy  I think that he would do well with heat, stretching, exercises as well as a good therapy program. He is comfortable with this.

## 2017-01-27 NOTE — Patient Instructions (Signed)
Heat, stretching and range of motion.exercises

## 2017-01-29 DIAGNOSIS — M542 Cervicalgia: Secondary | ICD-10-CM | POA: Diagnosis not present

## 2017-01-29 DIAGNOSIS — M6281 Muscle weakness (generalized): Secondary | ICD-10-CM | POA: Diagnosis not present

## 2017-01-29 DIAGNOSIS — M2560 Stiffness of unspecified joint, not elsewhere classified: Secondary | ICD-10-CM | POA: Diagnosis not present

## 2017-02-02 ENCOUNTER — Telehealth (INDEPENDENT_AMBULATORY_CARE_PROVIDER_SITE_OTHER): Payer: Self-pay | Admitting: Radiology

## 2017-02-02 NOTE — Telephone Encounter (Signed)
See below

## 2017-02-02 NOTE — Telephone Encounter (Signed)
Dr. Hardin Negus from [facility] called wanting to discuss patients back and hip pain with Dr. Ninfa Linden.  CB # [id number]

## 2017-02-03 DIAGNOSIS — M25551 Pain in right hip: Secondary | ICD-10-CM | POA: Diagnosis not present

## 2017-02-03 DIAGNOSIS — M542 Cervicalgia: Secondary | ICD-10-CM | POA: Diagnosis not present

## 2017-02-03 DIAGNOSIS — M47818 Spondylosis without myelopathy or radiculopathy, sacral and sacrococcygeal region: Secondary | ICD-10-CM | POA: Diagnosis not present

## 2017-02-03 DIAGNOSIS — M25511 Pain in right shoulder: Secondary | ICD-10-CM | POA: Diagnosis not present

## 2017-02-03 DIAGNOSIS — M4722 Other spondylosis with radiculopathy, cervical region: Secondary | ICD-10-CM | POA: Diagnosis not present

## 2017-02-03 DIAGNOSIS — M6281 Muscle weakness (generalized): Secondary | ICD-10-CM | POA: Diagnosis not present

## 2017-02-03 DIAGNOSIS — M2560 Stiffness of unspecified joint, not elsewhere classified: Secondary | ICD-10-CM | POA: Diagnosis not present

## 2017-02-03 DIAGNOSIS — Z79899 Other long term (current) drug therapy: Secondary | ICD-10-CM | POA: Diagnosis not present

## 2017-02-05 DIAGNOSIS — M2560 Stiffness of unspecified joint, not elsewhere classified: Secondary | ICD-10-CM | POA: Diagnosis not present

## 2017-02-05 DIAGNOSIS — M542 Cervicalgia: Secondary | ICD-10-CM | POA: Diagnosis not present

## 2017-02-05 DIAGNOSIS — M6281 Muscle weakness (generalized): Secondary | ICD-10-CM | POA: Diagnosis not present

## 2017-02-10 DIAGNOSIS — M2560 Stiffness of unspecified joint, not elsewhere classified: Secondary | ICD-10-CM | POA: Diagnosis not present

## 2017-02-10 DIAGNOSIS — M6281 Muscle weakness (generalized): Secondary | ICD-10-CM | POA: Diagnosis not present

## 2017-02-10 DIAGNOSIS — M542 Cervicalgia: Secondary | ICD-10-CM | POA: Diagnosis not present

## 2017-02-12 DIAGNOSIS — M6281 Muscle weakness (generalized): Secondary | ICD-10-CM | POA: Diagnosis not present

## 2017-02-12 DIAGNOSIS — M2560 Stiffness of unspecified joint, not elsewhere classified: Secondary | ICD-10-CM | POA: Diagnosis not present

## 2017-02-12 DIAGNOSIS — M542 Cervicalgia: Secondary | ICD-10-CM | POA: Diagnosis not present

## 2017-02-17 ENCOUNTER — Ambulatory Visit
Admission: RE | Admit: 2017-02-17 | Discharge: 2017-02-17 | Disposition: A | Discharge status: Home / Self Care | Payer: Medicare Other | Source: Ambulatory Visit | Attending: Anesthesiology | Admitting: Anesthesiology

## 2017-02-17 ENCOUNTER — Other Ambulatory Visit: Payer: Self-pay | Admitting: Anesthesiology

## 2017-02-17 DIAGNOSIS — M542 Cervicalgia: Secondary | ICD-10-CM | POA: Diagnosis not present

## 2017-02-17 DIAGNOSIS — M6281 Muscle weakness (generalized): Secondary | ICD-10-CM | POA: Diagnosis not present

## 2017-02-17 DIAGNOSIS — M25511 Pain in right shoulder: Secondary | ICD-10-CM | POA: Diagnosis not present

## 2017-02-17 DIAGNOSIS — M2560 Stiffness of unspecified joint, not elsewhere classified: Secondary | ICD-10-CM | POA: Diagnosis not present

## 2017-02-17 DIAGNOSIS — M47812 Spondylosis without myelopathy or radiculopathy, cervical region: Secondary | ICD-10-CM | POA: Diagnosis not present

## 2017-02-17 DIAGNOSIS — M62838 Other muscle spasm: Secondary | ICD-10-CM | POA: Diagnosis not present

## 2017-02-17 DIAGNOSIS — M47818 Spondylosis without myelopathy or radiculopathy, sacral and sacrococcygeal region: Secondary | ICD-10-CM | POA: Diagnosis not present

## 2017-02-17 NOTE — Progress Notes (Signed)
[name] [name] Date of Birth: Aug 19, 1929   History of Present Illness: [name] [name] is seen today for followup s/p MVR. He is status post mitral valve repair with single vessel coronary bypass surgery (due to refractory coronary spasm)  in 2002. Follow up Echo in July 2017 showed a good MV repair. EF was 55%. He had mild aortic stenosis. He also has a history of HTN and PVC/PACs.  On follow up today he is doing very well from a cardiac standpoint. No  Dyspnea or chest pain. No palpitations.  He is also having a lot of lumbar/ back/ hip issues and is followed by pain management and ortho. More recently having pain in the cervical region with muscle spasms.   Current Outpatient Prescriptions on File Prior to Visit  Medication Sig Dispense Refill  . amLODipine (NORVASC) 5 MG tablet take 1 tablet by mouth once daily every evening 90 tablet 0  . amoxicillin (AMOXIL) 500 MG capsule Take 4 capsules by mouth 1 day or 1 dose. 1 hour before dental appt.  0  . aspirin 81 MG tablet Take 81 mg by mouth daily.    . calcium carbonate (TUMS - DOSED IN MG ELEMENTAL CALCIUM) 500 MG chewable tablet Chew 1 tablet by mouth as needed for indigestion or heartburn. Reported on 02/28/2016    . Cholecalciferol (VITAMIN D) 1000 UNITS capsule Take 1,000 Units by mouth daily.      . Cyanocobalamin (B-12 PO) Take 1 tablet by mouth daily.    Marland Kitchen dutasteride (AVODART) 0.5 MG capsule Take 0.5 mg by mouth every evening.     . famotidine (PEPCID) 20 MG tablet Take 20 mg by mouth daily as needed for heartburn or indigestion.    . gabapentin (NEURONTIN) 300 MG capsule Take 300 mg by mouth 4 (four) times daily.    Marland Kitchen HYDROcodone-acetaminophen (NORCO/VICODIN) 5-325 MG tablet Take 1 tablet by mouth every 6 (six) hours as needed.     Marland Kitchen MYRBETRIQ 50 MG TB24 tablet Take 1 tablet by mouth daily.    Marland Kitchen omeprazole (PRILOSEC OTC) 20 MG tablet Take 20 mg by mouth daily.    [name] Glycol-Propyl Glycol (SYSTANE) 0.4-0.3 % SOLN Place 1 drop  into both eyes as needed (dry eye).    . rosuvastatin (CRESTOR) 20 MG tablet take 1 tablet by mouth once daily 30 tablet 11  . Tamsulosin HCl (FLOMAX) 0.4 MG CAPS Take 0.4 mg by mouth every evening.     . valsartan-hydrochlorothiazide (DIOVAN-HCT) 320-12.5 MG tablet Take 1 tablet by mouth every morning. 90 tablet 2  . Wheat Dextrin (BENEFIBER PO) Take 1 Dose by mouth daily.     No current facility-administered medications on file prior to visit.     No Known Allergies  Past Medical History:  Diagnosis Date  . Arthritis   . BPH (benign prostatic hyperplasia)   . CAD (coronary artery disease)    s/p CABG x 1  . DJD (degenerative joint disease)   . Gallstones   . HTN (hypertension)   . Hyperlipidemia   . Lumbar disc disease   . MVP (mitral valve prolapse)    s/p Mitral Valve Repair  . Pancreatitis   . 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 CHOLECYSTECTOMY     Dr. Lennie Hummer  . COLONOSCOPY  ~2008   no polyps per pt.  Dr. Doretha Sou [facility]  . CORONARY ARTERY BYPASS GRAFT  January 2002   Single bypass to the distal RCA  . ERCP N/A 02/22/2013   Procedure: ENDOSCOPIC RETROGRADE CHOLANGIOPANCREATOGRAPHY (ERCP);  Surgeon: [name], MD;  Location: [facility];  Service: Endoscopy;  Laterality: N/A;  . ERCP N/A 09/21/2015   Procedure: ENDOSCOPIC RETROGRADE CHOLANGIOPANCREATOGRAPHY (ERCP);  Surgeon: [name], MD;  Location: [facility] ENDOSCOPY;  Service: Endoscopy;  Laterality: N/A;  with spyglass  . HERNIA REPAIR  05/05/11   Lap L inguinal & obturator herniae, R Femoral Hernia  . MITRAL VALVE REPAIR  January 2002  . SPYGLASS CHOLANGIOSCOPY N/A 02/22/2013   Procedure: QJJHERDE CHOLANGIOSCOPY;  Surgeon: [name], MD;  Location: [facility] ENDOSCOPY;  Service: Endoscopy;  Laterality: N/A;  . SPYGLASS CHOLANGIOSCOPY N/A 09/21/2015   Procedure: [id number]  CHOLANGIOSCOPY;  Surgeon: [name], MD;  Location: [place];  Service: Endoscopy;  Laterality: N/A;  Bess Kinds LITHOTRIPSY N/A 02/22/2013   Procedure: [id number] LITHOTRIPSY;  Surgeon: [name], MD;  Location: [facility] END[facility]COPY;  Service: Endoscopy;  Laterality: N/A;  litho ord'd 7/8 by DL/PO [id number] [facility]  . UPPER GASTROINTESTINAL ENDOSCOPY  02/22/13    History  Smoking Status  . Never Smoker  Smokeless Tobacco  . Never Used    History  Alcohol Use  . 0.0 oz/week  . 1 - 2 Glasses of wine per week    Comment: rare alcohol use    Family History  Problem Relation Age of Onset  . Stroke Father   . Stroke Mother        TIA    Review of Systems: As noted in history of present illness. All other systems were reviewed and are negative.  Physical Exam: BP (!) 159/77   Pulse 62   Ht 6' 2"  (1.88 m)   Wt 183 lb (83 kg)   SpO2 96%   BMI 23.50 kg/m  He is a pleasant white male in no acute distress. His HEENT exam is unremarkable. He has no JVD or bruits. Lungs are clear. Cardiac exam reveals a regular rate and rhythm. There is a grade 2/6 harsh systolic murmur in the RUSB radiating to the left carotid and apex. No gallop.   Abdomen is soft and nontender without masses or bruits. He has no feet edema.    LABORATORY DATA: Lab Results  Component Value Date   WBC 7.0 09/10/2016   HGB 13.3 09/10/2016   HCT 39.6 09/10/2016   PLT 254 09/10/2016   GLUCOSE 93 09/10/2016   CHOL 101 09/10/2016   TRIG 83 09/10/2016   HDL 39 (L) 09/10/2016   LDLCALC 45 09/10/2016   ALT 12 09/10/2016   AST 20 09/10/2016   NA 140 09/10/2016   K 3.8 09/10/2016   CL 98 09/10/2016   CREATININE 0.88 09/10/2016   BUN 13 09/10/2016   CO2 27 09/10/2016   TSH 2.27 07/14/2013   INR 1.22 09/20/2015   Echo 02/15/16:Study Conclusions  - Left ventricle: The cavity size was mildly dilated. Wall   thickness was normal. Systolic function was normal. The estimated   ejection fraction was in the range of  50% to 55%. The study is   not technically sufficient to allow evaluation of LV diastolic   function. - Aortic valve: There was very mild stenosis. There was trivial   regurgitation. - Mitral valve: Post MV repair with no significant residual MR. - Left atrium: The atrium was severely dilated. - Atrial septum: No defect or patent  foramen ovale was identified  Assessment / Plan: 1. Mitral insufficiency status post mitral valve repair. Excellent long-term result. Echo in July 2017 showed excellent result.  2. Coronary disease status post single-vessel coronary bypass surgery with a vein graft to the distal RCA due to refractory spasm. He is asymptomatic. Continue ASA to 81 mg daily.  3. Hypertension, [name] pressure is generally well controlled. It is elevated some today with recent musculoskeletal pain. I told him it was OK to take a NSAID for acute pain.   4. Chronic PACs and PVCs. These are asymptomatic now. Wandering atrial pacemaker by prior Ecg (not Afib).  I will follow up in 6 months.

## 2017-02-19 ENCOUNTER — Ambulatory Visit (INDEPENDENT_AMBULATORY_CARE_PROVIDER_SITE_OTHER): Payer: Medicare Other | Admitting: Cardiology

## 2017-02-19 ENCOUNTER — Encounter: Payer: Self-pay | Admitting: Cardiology

## 2017-02-19 VITALS — BP 159/77 | HR 62 | Ht 74.0 in | Wt 183.0 lb

## 2017-02-19 DIAGNOSIS — Z9889 Other specified postprocedural states: Secondary | ICD-10-CM

## 2017-02-19 DIAGNOSIS — E7800 Pure hypercholesterolemia, unspecified: Secondary | ICD-10-CM | POA: Diagnosis not present

## 2017-02-19 DIAGNOSIS — M2560 Stiffness of unspecified joint, not elsewhere classified: Secondary | ICD-10-CM | POA: Diagnosis not present

## 2017-02-19 DIAGNOSIS — M542 Cervicalgia: Secondary | ICD-10-CM | POA: Diagnosis not present

## 2017-02-19 DIAGNOSIS — I493 Ventricular premature depolarization: Secondary | ICD-10-CM | POA: Diagnosis not present

## 2017-02-19 DIAGNOSIS — I25810 Atherosclerosis of coronary artery bypass graft(s) without angina pectoris: Secondary | ICD-10-CM

## 2017-02-19 DIAGNOSIS — M6281 Muscle weakness (generalized): Secondary | ICD-10-CM | POA: Diagnosis not present

## 2017-02-19 DIAGNOSIS — I10 Essential (primary) hypertension: Secondary | ICD-10-CM

## 2017-02-19 NOTE — Patient Instructions (Signed)
Continue your current therapy  I will see you in 6 months.   

## 2017-02-20 DIAGNOSIS — Z23 Encounter for immunization: Secondary | ICD-10-CM | POA: Diagnosis not present

## 2017-02-23 ENCOUNTER — Ambulatory Visit (INDEPENDENT_AMBULATORY_CARE_PROVIDER_SITE_OTHER): Payer: Medicare Other | Admitting: Orthopaedic Surgery

## 2017-02-23 DIAGNOSIS — M25551 Pain in right hip: Secondary | ICD-10-CM | POA: Diagnosis not present

## 2017-02-23 NOTE — Progress Notes (Signed)
The patient is well-known to me. He's been having some pain difficulties in his right hip. I have talked to his pain specialist Dr. Hardin Negus and we feel that is not a hip issue but likely more low back to the right side. This may be sacroiliac joint related as well as L5 to the right side. He's also having some initial issues. His groin pain has subsided.  I can still put his right hip to internal rotation rotation with minimal discomfort at all. It all seems to be more pain around the posterior pelvic and sacral area and low back.  A long and thorough discussion about his pain generators. He is also seeing a physical therapist for his neck. He has follow-up with Dr. Hardin Negus in 2 weeks and he'll keep that. He said he did have injection last week and that is helping. He said if there is any issues with his hip he will let me know and otherwise will follow up as needed.

## 2017-02-24 DIAGNOSIS — M6281 Muscle weakness (generalized): Secondary | ICD-10-CM | POA: Diagnosis not present

## 2017-02-24 DIAGNOSIS — M2560 Stiffness of unspecified joint, not elsewhere classified: Secondary | ICD-10-CM | POA: Diagnosis not present

## 2017-02-24 DIAGNOSIS — M542 Cervicalgia: Secondary | ICD-10-CM | POA: Diagnosis not present

## 2017-03-02 ENCOUNTER — Other Ambulatory Visit: Payer: Self-pay | Admitting: Cardiology

## 2017-03-02 NOTE — Telephone Encounter (Signed)
REFILL 

## 2017-03-10 DIAGNOSIS — M47812 Spondylosis without myelopathy or radiculopathy, cervical region: Secondary | ICD-10-CM | POA: Diagnosis not present

## 2017-03-10 DIAGNOSIS — M4722 Other spondylosis with radiculopathy, cervical region: Secondary | ICD-10-CM | POA: Diagnosis not present

## 2017-03-10 DIAGNOSIS — M47818 Spondylosis without myelopathy or radiculopathy, sacral and sacrococcygeal region: Secondary | ICD-10-CM | POA: Diagnosis not present

## 2017-03-10 DIAGNOSIS — M62838 Other muscle spasm: Secondary | ICD-10-CM | POA: Diagnosis not present

## 2017-03-12 ENCOUNTER — Ambulatory Visit (INDEPENDENT_AMBULATORY_CARE_PROVIDER_SITE_OTHER): Payer: Medicare Other | Admitting: Family Medicine

## 2017-03-12 ENCOUNTER — Encounter: Payer: Self-pay | Admitting: Family Medicine

## 2017-03-12 VITALS — BP 124/82 | HR 61 | Wt 186.0 lb

## 2017-03-12 DIAGNOSIS — L905 Scar conditions and fibrosis of skin: Secondary | ICD-10-CM | POA: Diagnosis not present

## 2017-03-12 NOTE — Progress Notes (Signed)
   Subjective:    Patient ID: [name] [name], male    DOB: 06-Jul-1930, 83 y.o.   MRN: [id number]  HPI He is here for evaluation of a lesion on his right leg that has been there for 2 weeks. It initially was a very small reddish lesion and then developed into a slight bleb. The area is where he had veins harvested for cardiac surgery.   Review of Systems     Objective:   Physical Exam A 1-1/2 cm healing lesion is noted in the midportion of the surgical scar from the harvesting of the vein.       Assessment & Plan:  Healing scar I explained that I thought this could possibly be his body spitting out a suture from his previous surgery and if not this certainly a benign lesion that no further intervention is needed. There is no evidence of infection with this and therefore conservative care as appropriate.

## 2017-03-24 ENCOUNTER — Ambulatory Visit: Payer: Medicare Other | Admitting: Cardiology

## 2017-04-29 DIAGNOSIS — M62838 Other muscle spasm: Secondary | ICD-10-CM | POA: Diagnosis not present

## 2017-04-29 DIAGNOSIS — M47812 Spondylosis without myelopathy or radiculopathy, cervical region: Secondary | ICD-10-CM | POA: Diagnosis not present

## 2017-04-29 DIAGNOSIS — M4722 Other spondylosis with radiculopathy, cervical region: Secondary | ICD-10-CM | POA: Diagnosis not present

## 2017-04-29 DIAGNOSIS — M47818 Spondylosis without myelopathy or radiculopathy, sacral and sacrococcygeal region: Secondary | ICD-10-CM | POA: Diagnosis not present

## 2017-05-27 DIAGNOSIS — M109 Gout, unspecified: Secondary | ICD-10-CM | POA: Diagnosis not present

## 2017-05-27 DIAGNOSIS — M62838 Other muscle spasm: Secondary | ICD-10-CM | POA: Diagnosis not present

## 2017-05-27 DIAGNOSIS — M47812 Spondylosis without myelopathy or radiculopathy, cervical region: Secondary | ICD-10-CM | POA: Diagnosis not present

## 2017-05-27 DIAGNOSIS — M47817 Spondylosis without myelopathy or radiculopathy, lumbosacral region: Secondary | ICD-10-CM | POA: Diagnosis not present

## 2017-05-27 DIAGNOSIS — M25551 Pain in right hip: Secondary | ICD-10-CM | POA: Diagnosis not present

## 2017-05-27 DIAGNOSIS — Z79899 Other long term (current) drug therapy: Secondary | ICD-10-CM | POA: Diagnosis not present

## 2017-05-27 DIAGNOSIS — M4722 Other spondylosis with radiculopathy, cervical region: Secondary | ICD-10-CM | POA: Diagnosis not present

## 2017-05-27 DIAGNOSIS — M47818 Spondylosis without myelopathy or radiculopathy, sacral and sacrococcygeal region: Secondary | ICD-10-CM | POA: Diagnosis not present

## 2017-05-28 ENCOUNTER — Encounter: Payer: Self-pay | Admitting: Family Medicine

## 2017-05-28 ENCOUNTER — Ambulatory Visit
Admission: RE | Admit: 2017-05-28 | Discharge: 2017-05-28 | Disposition: A | Discharge status: Home / Self Care | Payer: Medicare Other | Source: Ambulatory Visit | Attending: Family Medicine | Admitting: Family Medicine

## 2017-05-28 ENCOUNTER — Ambulatory Visit (INDEPENDENT_AMBULATORY_CARE_PROVIDER_SITE_OTHER): Payer: Medicare Other | Admitting: Family Medicine

## 2017-05-28 VITALS — BP 118/74 | HR 67 | Wt 188.4 lb

## 2017-05-28 DIAGNOSIS — R109 Unspecified abdominal pain: Secondary | ICD-10-CM

## 2017-05-28 DIAGNOSIS — M1990 Unspecified osteoarthritis, unspecified site: Secondary | ICD-10-CM | POA: Diagnosis not present

## 2017-05-28 DIAGNOSIS — M189 Osteoarthritis of first carpometacarpal joint, unspecified: Secondary | ICD-10-CM | POA: Diagnosis not present

## 2017-05-28 NOTE — Progress Notes (Signed)
   Subjective:    Patient ID: [name] [name], male    DOB: [birth year], 83 y.o.   MRN: [id number]  HPI He is here for evaluation of an intermittent history of right finger pain. The most recent episode has lasted over the last several weeks. He has swelling and discomfort as well as inability to fully flex and extend his right PIP index finger. He was seen by his pain specialist and recommended following up here for possible treatment and evaluation of gout. He is also having some right flank pain that tends to bother him mainly when he lays down at night. During the day and does not give any trouble. His pain specialist Dr. Hardin Negus gave him codeine however it kept him awake. He is now thinking about using morphine.    Review of Systems     Objective:   Physical Exam DIP hypertrophy noted in several of his fingers. Exam of the right index PIP joint does show some swelling with decreased range of motion but it is not hot or tender to palpation.      Assessment & Plan:  Arthritis - Plan: DG Finger Index Right  Right flank pain I discussed the diagnosis of gout in regard to uric acid. We'll hold off on getting it now as he is still having symptoms which could interfere with the number. We will see what the x-ray shows. Also discussed using Advil or Aleve for the discomfort. Encouraged him to go ahead and try the morphine to see if it helps with his pain.

## 2017-05-29 ENCOUNTER — Other Ambulatory Visit: Payer: Self-pay | Admitting: Family Medicine

## 2017-05-29 ENCOUNTER — Other Ambulatory Visit: Payer: Medicare Other

## 2017-05-29 DIAGNOSIS — M1990 Unspecified osteoarthritis, unspecified site: Secondary | ICD-10-CM | POA: Diagnosis not present

## 2017-05-29 LAB — URIC ACID: Uric Acid, Serum: 5.5 mg/dL (ref 4.0–8.0)

## 2017-06-01 ENCOUNTER — Telehealth: Payer: Self-pay | Admitting: Family Medicine

## 2017-06-01 NOTE — Telephone Encounter (Signed)
Pt called and stated he saw his xray results thru mychart. He would like to know what the next step is. Please call pt at [phone].

## 2017-06-22 DIAGNOSIS — H2513 Age-related nuclear cataract, bilateral: Secondary | ICD-10-CM | POA: Diagnosis not present

## 2017-06-25 DIAGNOSIS — M62838 Other muscle spasm: Secondary | ICD-10-CM | POA: Diagnosis not present

## 2017-06-25 DIAGNOSIS — M4722 Other spondylosis with radiculopathy, cervical region: Secondary | ICD-10-CM | POA: Diagnosis not present

## 2017-06-25 DIAGNOSIS — M47818 Spondylosis without myelopathy or radiculopathy, sacral and sacrococcygeal region: Secondary | ICD-10-CM | POA: Diagnosis not present

## 2017-06-25 DIAGNOSIS — M47812 Spondylosis without myelopathy or radiculopathy, cervical region: Secondary | ICD-10-CM | POA: Diagnosis not present

## 2017-06-30 ENCOUNTER — Other Ambulatory Visit: Payer: Self-pay

## 2017-06-30 MED ORDER — AMLODIPINE BESYLATE 5 MG PO TABS: ORAL_TABLET | ORAL | 3 refills | Status: DC

## 2017-07-14 DIAGNOSIS — L300 Nummular dermatitis: Secondary | ICD-10-CM | POA: Diagnosis not present

## 2017-07-14 DIAGNOSIS — Z85828 Personal history of other malignant neoplasm of skin: Secondary | ICD-10-CM | POA: Diagnosis not present

## 2017-07-14 DIAGNOSIS — D1801 Hemangioma of skin and subcutaneous tissue: Secondary | ICD-10-CM | POA: Diagnosis not present

## 2017-07-14 DIAGNOSIS — L570 Actinic keratosis: Secondary | ICD-10-CM | POA: Diagnosis not present

## 2017-07-14 DIAGNOSIS — D692 Other nonthrombocytopenic purpura: Secondary | ICD-10-CM | POA: Diagnosis not present

## 2017-07-14 DIAGNOSIS — L821 Other seborrheic keratosis: Secondary | ICD-10-CM | POA: Diagnosis not present

## 2017-07-29 DIAGNOSIS — N401 Enlarged prostate with lower urinary tract symptoms: Secondary | ICD-10-CM | POA: Diagnosis not present

## 2017-08-12 ENCOUNTER — Other Ambulatory Visit: Payer: Self-pay | Admitting: Cardiology

## 2017-08-12 NOTE — Telephone Encounter (Signed)
Rx has been sent to the pharmacy electronically. ° °

## 2017-08-13 DIAGNOSIS — H25811 Combined forms of age-related cataract, right eye: Secondary | ICD-10-CM | POA: Diagnosis not present

## 2017-08-13 DIAGNOSIS — H2511 Age-related nuclear cataract, right eye: Secondary | ICD-10-CM | POA: Diagnosis not present

## 2017-08-19 DIAGNOSIS — M47818 Spondylosis without myelopathy or radiculopathy, sacral and sacrococcygeal region: Secondary | ICD-10-CM | POA: Diagnosis not present

## 2017-08-19 DIAGNOSIS — M47812 Spondylosis without myelopathy or radiculopathy, cervical region: Secondary | ICD-10-CM | POA: Diagnosis not present

## 2017-08-19 DIAGNOSIS — Z79899 Other long term (current) drug therapy: Secondary | ICD-10-CM | POA: Diagnosis not present

## 2017-08-19 DIAGNOSIS — M4722 Other spondylosis with radiculopathy, cervical region: Secondary | ICD-10-CM | POA: Diagnosis not present

## 2017-08-21 DIAGNOSIS — H02002 Unspecified entropion of right lower eyelid: Secondary | ICD-10-CM | POA: Diagnosis not present

## 2017-08-27 DIAGNOSIS — H25812 Combined forms of age-related cataract, left eye: Secondary | ICD-10-CM | POA: Diagnosis not present

## 2017-08-27 DIAGNOSIS — H2512 Age-related nuclear cataract, left eye: Secondary | ICD-10-CM | POA: Diagnosis not present

## 2017-08-31 NOTE — Progress Notes (Signed)
[name] [name] Date of Birth: [birth year]   History of Present Illness: [name] is seen today for followup s/p MVR. He is status post mitral valve repair with single vessel coronary bypass surgery (due to refractory coronary spasm)  in 2002. Follow up Echo in July 2017 showed a good MV repair. EF was 55%. He had mild aortic stenosis. He also has a history of HTN and PVC/PACs.  On follow up today he is doing very well from a cardiac standpoint. No  Dyspnea or chest pain. No palpitations.  He is still having a lot of lumbar/ back/ hip issues related to arthritis and concerned that he may end up needing a walker. He is active in the aquatic program at [facility].   Current Outpatient Medications on File Prior to Visit  Medication Sig Dispense Refill  . amLODipine (NORVASC) 5 MG tablet take 1 tablet by mouth once daily every evening 90 tablet 3  . aspirin 81 MG tablet Take 81 mg by mouth daily.    . Cholecalciferol (VITAMIN D) 1000 UNITS capsule Take 1,000 Units by mouth daily.      . Cyanocobalamin (B-12 PO) Take 1 tablet by mouth daily.    Marland Kitchen dutasteride (AVODART) 0.5 MG capsule Take 0.5 mg by mouth every evening.     . famotidine (PEPCID) 20 MG tablet Take 20 mg by mouth daily as needed for heartburn or indigestion.    . gabapentin (NEURONTIN) 300 MG capsule Take 300 mg by mouth 4 (four) times daily.    Marland Kitchen MYRBETRIQ 50 MG TB24 tablet Take 1 tablet by mouth daily.    . rosuvastatin (CRESTOR) 20 MG tablet take 1 tablet by mouth once daily 30 tablet 11  . Tamsulosin HCl (FLOMAX) 0.4 MG CAPS Take 0.4 mg by mouth every evening.     . valsartan-hydrochlorothiazide (DIOVAN-HCT) 320-12.5 MG tablet take 1 tablet by mouth every morning 90 tablet 1  . Wheat Dextrin (BENEFIBER PO) Take 1 Dose by mouth daily.    Marland Kitchen HYDROcodone-acetaminophen (NORCO/VICODIN) 5-325 MG tablet Take 1 tablet by mouth every 6 (six) hours as needed.      No current facility-administered medications on file prior to visit.      No Known Allergies  Past Medical History:  Diagnosis Date  . Arthritis   . BPH (benign prostatic hyperplasia)   . CAD (coronary artery disease)    s/p CABG x 1  . DJD (degenerative joint disease)   . Gallstones   . HTN (hypertension)   . Hyperlipidemia   . Lumbar disc disease   . MVP (mitral valve prolapse)    s/p Mitral Valve Repair  . Pancreatitis   . 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 CHOLECYSTECTOMY     Dr. Lennie Hummer  . COLONOSCOPY  ~2008   no polyps per pt.  Dr. [name]  . CORONARY ARTERY BYPASS GRAFT  January 2002   Single bypass to the distal RCA  . ERCP N/A 02/22/2013   Procedure: ENDOSCOPIC RETROGRADE CHOLANGIOPANCREATOGRAPHY (ERCP);  Surgeon: [name], MD;  Location: [facility];  Service: Endoscopy;  Laterality: N/A;  . ERCP N/A 09/21/2015   Procedure: ENDOSCOPIC RETROGRADE CHOLANGIOPANCREATOGRAPHY (ERCP);  Surgeon: [name], MD;  Location[facility];  Service: Endoscopy;  Laterality: N/A;  with spyglass  . HERNIA REPAIR  05/05/11   Lap L inguinal &  obturator herniae, R Femoral Hernia  . MITRAL VALVE REPAIR  January 2002  . SPYGLASS CHOLANGIOSCOPY N/A 02/22/2013   Procedure: [id number] CHOLANGIOSCOPY;  Surgeon: [name], MD;  Location: [facility];  Service: Endoscopy;  Laterality: N/A;  . SPYGLASS CHOLANGIOSCOPY N/A 09/21/2015   Procedure: GEZMOQHU CHOLANGIOSCOPY;  Surgeon: [name], MD;  Location: [place];  Service: Endoscopy;  Laterality: N/A;  Bess Kinds LITHOTRIPSY N/A 02/22/2013   Procedure: [id number] LITHOTRIPSY;  Surgeon: [name], MD;  Location: [facility]OS[facility];  Service: Endoscopy;  Laterality: N/A;  litho ord'd 7/8 by DL/PO [id number] [facility]  . UPPER GASTROINTESTINAL ENDOSCOPY  02/22/13    Social History   Tobacco Use  Smoking Status Never Smoker  Smokeless Tobacco Never Used     Social History   Substance and Sexual Activity  Alcohol Use Yes  . Alcohol/week: 0.0 oz  . Types: 1 - 2 Glasses of wine per week   Comment: rare alcohol use    Family History  Problem Relation Age of Onset  . Stroke Father   . Stroke Mother        TIA    Review of Systems: As noted in history of present illness. All other systems were reviewed and are negative.  Physical Exam: BP 130/82 (BP Location: Left Arm, Patient Position: Sitting, Cuff Size: Large)   Pulse 63   Ht 6' 2"  (1.88 m)   Wt 183 lb 3.2 oz (83.1 kg)   BMI 23.52 kg/m  GENERAL:  Well appearing, elderly WM in NAD HEENT:  PERRL, EOMI, sclera are clear. Oropharynx is clear. NECK:  No jugular venous distention, carotid upstroke brisk and symmetric, no bruits, no thyromegaly or adenopathy LUNGS:  Clear to auscultation bilaterally CHEST:  Unremarkable HEART:  RRR with extrasystoles,  PMI not displaced or sustained,S1 and S2 within normal limits, no S3, no S4: there is a gr 7-5/1 systolic murmur RUSB radiating to the apex and bilateral carotids L>R ABD:  Soft, nontender. BS +, no masses or bruits. No hepatomegaly, no splenomegaly EXT:  2 + pulses throughout, no edema, no cyanosis no clubbing SKIN:  Warm and dry.  No rashes NEURO:  Alert and oriented x 3. Cranial nerves II through XII intact. PSYCH:  Cognitively intact    LABORATORY DATA: Lab Results  Component Value Date   WBC 7.0 09/10/2016   HGB 13.3 09/10/2016   HCT 39.6 09/10/2016   PLT 254 09/10/2016   GLUCOSE 93 09/10/2016   CHOL 101 09/10/2016   TRIG 83 09/10/2016   HDL 39 (L) 09/10/2016   LDLCALC 45 09/10/2016   ALT 12 09/10/2016   AST 20 09/10/2016   NA 140 09/10/2016   K 3.8 09/10/2016   CL 98 09/10/2016   CREATININE 0.88 09/10/2016   BUN 13 09/10/2016   CO2 27 09/10/2016   TSH 2.27 07/14/2013   INR 1.22 09/20/2015    ECG today shows NSR with frequent PVCs. QT prolonged with QTc 495 msec. I have personally reviewed and interpreted  this study.  Echo 02/15/16:Study Conclusions  - Left ventricle: The cavity size was mildly dilated. Wall   thickness was normal. Systolic function was normal. The estimated   ejection fraction was in the range of 50% to 55%. The study is   not technically sufficient to allow evaluation of LV diastolic   function. - Aortic valve: There was very mild stenosis. There was trivial   regurgitation. - Mitral valve: Post MV repair with no significant residual MR. -  Left atrium: The atrium was severely dilated. - Atrial septum: No defect or patent foramen ovale was identified  Assessment / Plan: 1. Mitral insufficiency status post mitral valve repair. Excellent long-term result. Echo in July 2017 showed excellent result.  2. Coronary disease status post single-vessel coronary bypass surgery with a vein graft to the distal RCA due to refractory spasm at the time of surgery. He is asymptomatic. Continue ASA to 81 mg daily.  3. Hypertension, [name] pressure is  well controlled.   4. Chronic PACs and PVCs. These are asymptomatic now. Wandering atrial pacemaker by prior Ecg (not Afib).  5. Aortic stenosis. Mild by Echo 2017.   We will check complete [name] work today.  I will follow up in 6 months.

## 2017-09-04 ENCOUNTER — Ambulatory Visit: Payer: Medicare Other | Admitting: Cardiology

## 2017-09-04 ENCOUNTER — Encounter: Payer: Self-pay | Admitting: Cardiology

## 2017-09-04 VITALS — BP 130/82 | HR 63 | Ht 74.0 in | Wt 183.2 lb

## 2017-09-04 DIAGNOSIS — I25810 Atherosclerosis of coronary artery bypass graft(s) without angina pectoris: Secondary | ICD-10-CM

## 2017-09-04 DIAGNOSIS — E7800 Pure hypercholesterolemia, unspecified: Secondary | ICD-10-CM | POA: Diagnosis not present

## 2017-09-04 DIAGNOSIS — Z9889 Other specified postprocedural states: Secondary | ICD-10-CM

## 2017-09-04 DIAGNOSIS — I493 Ventricular premature depolarization: Secondary | ICD-10-CM | POA: Diagnosis not present

## 2017-09-04 DIAGNOSIS — I10 Essential (primary) hypertension: Secondary | ICD-10-CM

## 2017-09-04 NOTE — Patient Instructions (Signed)
Continue your current therapy  We will check lab work today  I will see you in 6 months

## 2017-09-05 LAB — CBC WITH DIFFERENTIAL/PLATELET
BASOS: 1 %
Basophils Absolute: 0 10*3/uL (ref 0.0–0.2)
EOS (ABSOLUTE): 0.2 10*3/uL (ref 0.0–0.4)
Eos: 3 %
Hematocrit: 39 % (ref 37.5–51.0)
Hemoglobin: 13.4 g/dL (ref 13.0–17.7)
IMMATURE GRANULOCYTES: 0 %
Immature Grans (Abs): 0 10*3/uL (ref 0.0–0.1)
Lymphocytes Absolute: 1 10*3/uL (ref 0.7–3.1)
Lymphs: 14 %
MCH: 29.8 pg (ref 26.6–33.0)
MCHC: 34.4 g/dL (ref 31.5–35.7)
MCV: 87 fL (ref 79–97)
MONOS ABS: 0.6 10*3/uL (ref 0.1–0.9)
Monocytes: 8 %
NEUTROS PCT: 74 %
Neutrophils Absolute: 5.5 10*3/uL (ref 1.4–7.0)
PLATELETS: 220 10*3/uL (ref 150–379)
RBC: 4.49 x10E6/uL (ref 4.14–5.80)
RDW: 13.7 % (ref 12.3–15.4)
WBC: 7.4 10*3/uL (ref 3.4–10.8)

## 2017-09-05 LAB — BASIC METABOLIC PANEL
BUN/Creatinine Ratio: 19 (ref 10–24)
BUN: 18 mg/dL (ref 8–27)
CALCIUM: 8.9 mg/dL (ref 8.6–10.2)
CO2: 27 mmol/L (ref 20–29)
Chloride: 101 mmol/L (ref 96–106)
Creatinine, Ser: 0.95 mg/dL (ref 0.76–1.27)
GFR calc Af Amer: 84 mL/min/{1.73_m2} (ref >59)
GFR calc non Af Amer: 73 mL/min/{1.73_m2} (ref >59)
Glucose: 96 mg/dL (ref 65–99)
POTASSIUM: 3.9 mmol/L (ref 3.5–5.2)
Sodium: 142 mmol/L (ref 134–144)

## 2017-09-05 LAB — LIPID PANEL W/O CHOL/HDL RATIO
CHOLESTEROL TOTAL: 112 mg/dL (ref 100–199)
HDL: 45 mg/dL (ref >39)
LDL Calculated: 53 mg/dL (ref 0–99)
TRIGLYCERIDES: 72 mg/dL (ref 0–149)
VLDL Cholesterol Cal: 14 mg/dL (ref 5–40)

## 2017-09-05 LAB — HEPATIC FUNCTION PANEL
ALK PHOS: 88 IU/L (ref 39–117)
ALT: 9 IU/L (ref 0–44)
AST: 20 IU/L (ref 0–40)
Albumin: 4.1 g/dL (ref 3.5–4.7)
Bilirubin Total: 0.8 mg/dL (ref 0.0–1.2)
Bilirubin, Direct: 0.27 mg/dL (ref 0.00–0.40)
Total Protein: 6.7 g/dL (ref 6.0–8.5)

## 2017-09-05 LAB — TSH: TSH: 2.51 u[IU]/mL (ref 0.450–4.500)

## 2017-09-07 ENCOUNTER — Other Ambulatory Visit: Payer: Self-pay | Admitting: Family Medicine

## 2017-09-29 DIAGNOSIS — Z79899 Other long term (current) drug therapy: Secondary | ICD-10-CM | POA: Diagnosis not present

## 2017-09-29 DIAGNOSIS — M47812 Spondylosis without myelopathy or radiculopathy, cervical region: Secondary | ICD-10-CM | POA: Diagnosis not present

## 2017-09-29 DIAGNOSIS — M47818 Spondylosis without myelopathy or radiculopathy, sacral and sacrococcygeal region: Secondary | ICD-10-CM | POA: Diagnosis not present

## 2017-09-29 DIAGNOSIS — M4722 Other spondylosis with radiculopathy, cervical region: Secondary | ICD-10-CM | POA: Diagnosis not present

## 2017-10-02 DIAGNOSIS — Z961 Presence of intraocular lens: Secondary | ICD-10-CM | POA: Diagnosis not present

## 2017-10-05 ENCOUNTER — Other Ambulatory Visit: Payer: Self-pay | Admitting: Physical Medicine and Rehabilitation

## 2017-10-05 DIAGNOSIS — M47817 Spondylosis without myelopathy or radiculopathy, lumbosacral region: Secondary | ICD-10-CM

## 2017-10-06 ENCOUNTER — Other Ambulatory Visit: Payer: Self-pay | Admitting: Anesthesiology

## 2017-10-06 DIAGNOSIS — M47817 Spondylosis without myelopathy or radiculopathy, lumbosacral region: Secondary | ICD-10-CM

## 2017-10-13 ENCOUNTER — Ambulatory Visit
Admission: RE | Admit: 2017-10-13 | Discharge: 2017-10-13 | Disposition: A | Discharge status: Home / Self Care | Payer: Medicare Other | Source: Ambulatory Visit | Attending: Physical Medicine and Rehabilitation | Admitting: Physical Medicine and Rehabilitation

## 2017-10-13 DIAGNOSIS — M5126 Other intervertebral disc displacement, lumbar region: Secondary | ICD-10-CM | POA: Diagnosis not present

## 2017-10-13 DIAGNOSIS — M47817 Spondylosis without myelopathy or radiculopathy, lumbosacral region: Secondary | ICD-10-CM

## 2017-10-13 DIAGNOSIS — M47816 Spondylosis without myelopathy or radiculopathy, lumbar region: Secondary | ICD-10-CM | POA: Diagnosis not present

## 2017-10-13 MED ORDER — GADOBENATE DIMEGLUMINE 529 MG/ML IV SOLN
20.0000 mL | Freq: Once | INTRAVENOUS | Status: AC | PRN
  Administered 2017-10-13: 20 mL via INTRAVENOUS

## 2017-11-06 ENCOUNTER — Other Ambulatory Visit: Payer: Self-pay

## 2017-11-06 MED ORDER — VALSARTAN-HYDROCHLOROTHIAZIDE 320-12.5 MG PO TABS: 1.0000 | ORAL_TABLET | Freq: Every morning | ORAL | 1 refills | Status: DC

## 2017-11-09 DIAGNOSIS — M4722 Other spondylosis with radiculopathy, cervical region: Secondary | ICD-10-CM | POA: Diagnosis not present

## 2017-11-09 DIAGNOSIS — M47812 Spondylosis without myelopathy or radiculopathy, cervical region: Secondary | ICD-10-CM | POA: Diagnosis not present

## 2017-11-09 DIAGNOSIS — Z79899 Other long term (current) drug therapy: Secondary | ICD-10-CM | POA: Diagnosis not present

## 2017-11-09 DIAGNOSIS — M47818 Spondylosis without myelopathy or radiculopathy, sacral and sacrococcygeal region: Secondary | ICD-10-CM | POA: Diagnosis not present

## 2017-11-24 DIAGNOSIS — M47817 Spondylosis without myelopathy or radiculopathy, lumbosacral region: Secondary | ICD-10-CM | POA: Diagnosis not present

## 2017-12-15 ENCOUNTER — Ambulatory Visit
Admission: RE | Admit: 2017-12-15 | Discharge: 2017-12-15 | Disposition: A | Discharge status: Home / Self Care | Payer: Medicare Other | Source: Ambulatory Visit | Attending: Anesthesiology | Admitting: Anesthesiology

## 2017-12-15 ENCOUNTER — Other Ambulatory Visit: Payer: Self-pay | Admitting: Anesthesiology

## 2017-12-15 DIAGNOSIS — M47812 Spondylosis without myelopathy or radiculopathy, cervical region: Secondary | ICD-10-CM | POA: Diagnosis not present

## 2017-12-15 DIAGNOSIS — Z79899 Other long term (current) drug therapy: Secondary | ICD-10-CM | POA: Diagnosis not present

## 2017-12-15 DIAGNOSIS — M533 Sacrococcygeal disorders, not elsewhere classified: Secondary | ICD-10-CM | POA: Diagnosis not present

## 2017-12-15 DIAGNOSIS — M4722 Other spondylosis with radiculopathy, cervical region: Secondary | ICD-10-CM | POA: Diagnosis not present

## 2017-12-15 DIAGNOSIS — M25551 Pain in right hip: Secondary | ICD-10-CM

## 2017-12-15 DIAGNOSIS — M47818 Spondylosis without myelopathy or radiculopathy, sacral and sacrococcygeal region: Secondary | ICD-10-CM | POA: Diagnosis not present

## 2017-12-18 DIAGNOSIS — L820 Inflamed seborrheic keratosis: Secondary | ICD-10-CM | POA: Diagnosis not present

## 2017-12-21 ENCOUNTER — Ambulatory Visit (INDEPENDENT_AMBULATORY_CARE_PROVIDER_SITE_OTHER): Payer: Medicare Other | Admitting: Family Medicine

## 2017-12-21 ENCOUNTER — Encounter: Payer: Self-pay | Admitting: Family Medicine

## 2017-12-21 ENCOUNTER — Telehealth: Payer: Self-pay | Admitting: Family Medicine

## 2017-12-21 VITALS — BP 160/82 | HR 60 | Temp 97.5°F | Ht 73.0 in | Wt 185.2 lb

## 2017-12-21 DIAGNOSIS — R42 Dizziness and giddiness: Secondary | ICD-10-CM | POA: Diagnosis not present

## 2017-12-21 NOTE — Progress Notes (Signed)
   Subjective:    Patient ID: [name] [name], male    DOB: 1929-09-13, 84 y.o.   MRN: [id number]  HPI He states that after he got up from using the computer he noted slight dizziness with weakness, numbness and tremor in his right hand as well as a numb feeling.  This is slightly better but he still notes a slight tremor and dizziness.  No headache, falling to one side, blurred or double vision.   Review of Systems     Objective:   Physical Exam Alert and oriented x3.  No carotid bruits noted.  Full motion of both arms.  Normal motor, sensory and DTRs.  Pulses normal.  Cerebellar testing normal.  No tremor was noted.  Cardiac exam does show an irregular rhythm.       Assessment & Plan:  Dizziness Although his symptoms are concerning, he has no objective findings to go along with this.  He is also presently on aspirin.  Did warn that if any of the symptoms got worse to call EMS for transport to the hospital and further evaluation.

## 2017-12-21 NOTE — Telephone Encounter (Signed)
Bernedet with [facility] called and advised pt rt arm is going numb.  His bp is 160 86.  Advised Dr. Redmond School of pt coming in.  He advised call back and get more info.  Called Bernedet back, she states pt drove to her and drove away.  She saw no stroke symptoms.  Good memory, good strength in both legs.  No facial drooping.  Advised Dr. Redmond School pt has appt in 20 minutes here.

## 2017-12-22 ENCOUNTER — Other Ambulatory Visit: Payer: Self-pay | Admitting: Family Medicine

## 2017-12-29 DIAGNOSIS — R293 Abnormal posture: Secondary | ICD-10-CM | POA: Diagnosis not present

## 2017-12-29 DIAGNOSIS — M542 Cervicalgia: Secondary | ICD-10-CM | POA: Diagnosis not present

## 2018-01-05 DIAGNOSIS — R293 Abnormal posture: Secondary | ICD-10-CM | POA: Diagnosis not present

## 2018-01-05 DIAGNOSIS — M542 Cervicalgia: Secondary | ICD-10-CM | POA: Diagnosis not present

## 2018-01-12 DIAGNOSIS — L821 Other seborrheic keratosis: Secondary | ICD-10-CM | POA: Diagnosis not present

## 2018-01-12 DIAGNOSIS — R293 Abnormal posture: Secondary | ICD-10-CM | POA: Diagnosis not present

## 2018-01-12 DIAGNOSIS — D225 Melanocytic nevi of trunk: Secondary | ICD-10-CM | POA: Diagnosis not present

## 2018-01-12 DIAGNOSIS — M542 Cervicalgia: Secondary | ICD-10-CM | POA: Diagnosis not present

## 2018-01-12 DIAGNOSIS — L820 Inflamed seborrheic keratosis: Secondary | ICD-10-CM | POA: Diagnosis not present

## 2018-01-12 DIAGNOSIS — L570 Actinic keratosis: Secondary | ICD-10-CM | POA: Diagnosis not present

## 2018-01-12 DIAGNOSIS — Z85828 Personal history of other malignant neoplasm of skin: Secondary | ICD-10-CM | POA: Diagnosis not present

## 2018-01-12 DIAGNOSIS — D1801 Hemangioma of skin and subcutaneous tissue: Secondary | ICD-10-CM | POA: Diagnosis not present

## 2018-01-14 DIAGNOSIS — M4722 Other spondylosis with radiculopathy, cervical region: Secondary | ICD-10-CM | POA: Diagnosis not present

## 2018-01-14 DIAGNOSIS — Z79891 Long term (current) use of opiate analgesic: Secondary | ICD-10-CM | POA: Diagnosis not present

## 2018-01-14 DIAGNOSIS — M47818 Spondylosis without myelopathy or radiculopathy, sacral and sacrococcygeal region: Secondary | ICD-10-CM | POA: Diagnosis not present

## 2018-01-14 DIAGNOSIS — Z79899 Other long term (current) drug therapy: Secondary | ICD-10-CM | POA: Diagnosis not present

## 2018-01-14 DIAGNOSIS — M47812 Spondylosis without myelopathy or radiculopathy, cervical region: Secondary | ICD-10-CM | POA: Diagnosis not present

## 2018-01-19 DIAGNOSIS — M542 Cervicalgia: Secondary | ICD-10-CM | POA: Diagnosis not present

## 2018-01-19 DIAGNOSIS — R293 Abnormal posture: Secondary | ICD-10-CM | POA: Diagnosis not present

## 2018-02-02 DIAGNOSIS — M542 Cervicalgia: Secondary | ICD-10-CM | POA: Diagnosis not present

## 2018-02-02 DIAGNOSIS — R293 Abnormal posture: Secondary | ICD-10-CM | POA: Diagnosis not present

## 2018-02-04 DIAGNOSIS — R293 Abnormal posture: Secondary | ICD-10-CM | POA: Diagnosis not present

## 2018-02-04 DIAGNOSIS — M542 Cervicalgia: Secondary | ICD-10-CM | POA: Diagnosis not present

## 2018-02-09 DIAGNOSIS — M542 Cervicalgia: Secondary | ICD-10-CM | POA: Diagnosis not present

## 2018-02-09 DIAGNOSIS — R293 Abnormal posture: Secondary | ICD-10-CM | POA: Diagnosis not present

## 2018-02-16 DIAGNOSIS — R293 Abnormal posture: Secondary | ICD-10-CM | POA: Diagnosis not present

## 2018-02-16 DIAGNOSIS — M542 Cervicalgia: Secondary | ICD-10-CM | POA: Diagnosis not present

## 2018-02-18 DIAGNOSIS — M542 Cervicalgia: Secondary | ICD-10-CM | POA: Diagnosis not present

## 2018-02-18 DIAGNOSIS — R293 Abnormal posture: Secondary | ICD-10-CM | POA: Diagnosis not present

## 2018-02-24 DIAGNOSIS — R293 Abnormal posture: Secondary | ICD-10-CM | POA: Diagnosis not present

## 2018-02-24 DIAGNOSIS — M542 Cervicalgia: Secondary | ICD-10-CM | POA: Diagnosis not present

## 2018-03-02 DIAGNOSIS — M542 Cervicalgia: Secondary | ICD-10-CM | POA: Diagnosis not present

## 2018-03-02 DIAGNOSIS — R293 Abnormal posture: Secondary | ICD-10-CM | POA: Diagnosis not present

## 2018-03-10 DIAGNOSIS — M47818 Spondylosis without myelopathy or radiculopathy, sacral and sacrococcygeal region: Secondary | ICD-10-CM | POA: Diagnosis not present

## 2018-03-19 ENCOUNTER — Other Ambulatory Visit: Payer: Self-pay | Admitting: Family Medicine

## 2018-04-06 ENCOUNTER — Telehealth: Payer: Self-pay | Admitting: Cardiology

## 2018-04-06 NOTE — Telephone Encounter (Signed)
Discussed when labs were done last and nothing noted in chart would need prior to follow up with Dr Martinique

## 2018-04-06 NOTE — Telephone Encounter (Signed)
New Message:     Pt is requesting a call back about blood work

## 2018-04-07 DIAGNOSIS — Z79899 Other long term (current) drug therapy: Secondary | ICD-10-CM | POA: Diagnosis not present

## 2018-04-07 DIAGNOSIS — M4722 Other spondylosis with radiculopathy, cervical region: Secondary | ICD-10-CM | POA: Diagnosis not present

## 2018-04-07 DIAGNOSIS — M47818 Spondylosis without myelopathy or radiculopathy, sacral and sacrococcygeal region: Secondary | ICD-10-CM | POA: Diagnosis not present

## 2018-04-07 DIAGNOSIS — M47812 Spondylosis without myelopathy or radiculopathy, cervical region: Secondary | ICD-10-CM | POA: Diagnosis not present

## 2018-04-11 DEATH — deceased

## 2018-04-13 ENCOUNTER — Ambulatory Visit: Payer: Medicare Other | Admitting: Family Medicine

## 2018-04-13 ENCOUNTER — Encounter: Payer: Self-pay | Admitting: Family Medicine

## 2018-04-13 VITALS — BP 158/90 | HR 62 | Temp 97.4°F | Wt 185.8 lb

## 2018-04-13 DIAGNOSIS — R197 Diarrhea, unspecified: Secondary | ICD-10-CM

## 2018-04-13 DIAGNOSIS — M1990 Unspecified osteoarthritis, unspecified site: Secondary | ICD-10-CM

## 2018-04-13 DIAGNOSIS — M48061 Spinal stenosis, lumbar region without neurogenic claudication: Secondary | ICD-10-CM | POA: Diagnosis not present

## 2018-04-13 DIAGNOSIS — A0472 Enterocolitis due to Clostridium difficile, not specified as recurrent: Secondary | ICD-10-CM

## 2018-04-13 NOTE — Progress Notes (Addendum)
   Subjective:    Patient ID: [name] [name], male    DOB: 07-07-1930, 84 y.o.   MRN: [id number]  HPI He is here to discuss multiple issues.  He is concerned about getting blood work however he had that done in January.  I explained that this would be enough until next January we can reevaluate it at that point.  Over the last 2 months he has also had difficulty with loose stools and has been using Benefiber.  He has had no blood or pus or foul-smelling stool.  No recent history of antibiotics.  He continues to have difficulty with back pain and is being cared for by Dr. Hardin Negus.  Apparently he has had injections which he says have had minimal effect.  He was told this was in the SI joint.  He has been getting physical therapy for his neck which he says has helped with that arthritic type problem.  He also has what he describes as a tingling sensation in his feet but is not overly concerned about that.  He does have some difficulties occasionally with leg cramps.   Review of Systems     Objective:   Physical Exam Alert and in no distress.  Exam of his lower extremities shows the skin to be normal in appearance       Assessment & Plan:  Diarrhea, unspecified type - Plan: Cdiff NAA+O+P+Stool Culture, Cdiff NAA+O+P+Stool Culture  Arthritis  Spinal stenosis of lumbar region, unspecified whether neurogenic claudication present I will make sure that he has no infectious etiology and then possibly have GI if he continues to have loose stools.   Discussed the neck pain and the need for him to continue with physical therapy for that.  He will also discuss with his physical therapist the SI joint discomfort and continue with his neck therapy.  Also discussed the possibility of chiropractic manipulation. Then discussed the leg discomfort and explained that there is really not much therapy that has been useful for that. Over 25 minutes, greater than 50% spent in counseling and coordination of  care. Culture results came back positive for C. difficile.  I will treat with vancomycin.

## 2018-04-15 DIAGNOSIS — R197 Diarrhea, unspecified: Secondary | ICD-10-CM | POA: Diagnosis not present

## 2018-04-16 DIAGNOSIS — R197 Diarrhea, unspecified: Secondary | ICD-10-CM | POA: Diagnosis not present

## 2018-04-16 NOTE — Progress Notes (Signed)
[name] [name] Date of Birth: 1930-01-13   History of Present Illness: [name] [name] is seen today for followup s/p MVR. He is status post mitral valve repair with single vessel coronary bypass surgery (due to refractory coronary spasm)  in 2002. Follow up Echo in July 2017 showed a good MV repair. EF was 55%. He had mild aortic stenosis. He also has a history of HTN and PVC/PACs.  On follow up today he is doing very well from a cardiac standpoint. No  Dyspnea or chest pain. No palpitations.  He is still having a lot of lumbar/ back/ hip issues related to arthritis. He is followed by pain management and is planning to start PT.  He does do the aquatic program at [facility].   Current Outpatient Medications on File Prior to Visit  Medication Sig Dispense Refill  . amLODipine (NORVASC) 5 MG tablet take 1 tablet by mouth once daily every evening 90 tablet 3  . aspirin 81 MG tablet Take 81 mg by mouth daily.    . Cholecalciferol (VITAMIN D) 1000 UNITS capsule Take 1,000 Units by mouth daily.      . Cyanocobalamin (B-12 PO) Take 1 tablet by mouth daily.    Marland Kitchen dutasteride (AVODART) 0.5 MG capsule Take 0.5 mg by mouth every evening.     . famotidine (PEPCID) 20 MG tablet Take 20 mg by mouth daily as needed for heartburn or indigestion.    . gabapentin (NEURONTIN) 300 MG capsule Take 300 mg by mouth 4 (four) times daily.    Marland Kitchen HYDROcodone-acetaminophen (NORCO/VICODIN) 5-325 MG tablet Take 1 tablet by mouth every 6 (six) hours as needed.     Marland Kitchen MYRBETRIQ 50 MG TB24 tablet Take 1 tablet by mouth daily.    . rosuvastatin (CRESTOR) 20 MG tablet TAKE 1 TABLET BY MOUTH ONCE DAILY 90 tablet 0  . Tamsulosin HCl (FLOMAX) 0.4 MG CAPS Take 0.4 mg by mouth every evening.     . valsartan-hydrochlorothiazide (DIOVAN-HCT) 320-12.5 MG tablet Take 1 tablet by mouth every morning. 90 tablet 1  . Wheat Dextrin (BENEFIBER PO) Take 1 Dose by mouth daily.     No current facility-administered medications on file prior to  visit.     No Known Allergies  Past Medical History:  Diagnosis Date  . Arthritis   . BPH (benign prostatic hyperplasia)   . CAD (coronary artery disease)    s/p CABG x 1  . DJD (degenerative joint disease)   . Gallstones   . HTN (hypertension)   . Hyperlipidemia   . Lumbar disc disease   . MVP (mitral valve prolapse)    s/p Mitral Valve Repair  . Pancreatitis   . 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 CHOLECYSTECTOMY     Dr. Lennie Hummer  . COLONOSCOPY  ~2008   no polyps per pt.  Dr. [name]  . CORONARY ARTERY BYPASS GRAFT  January 2002   Single bypass to the distal RCA  . ERCP N/A 02/22/2013   Procedure: ENDOSCOPIC RETROGRADE CHOLANGIOPANCREATOGRAPHY (ERCP);  Surgeon: [name], MD;  Location: [facility];  Service: Endoscopy;  Laterality: N/A;  . ERCP N/A 09/21/2015   Procedure: ENDOSCOPIC RETROGRADE CHOLANGIOPANCREATOGRAPHY (ERCP);  Surgeon: [name], MD;  Location: [facility] ENDOSCOPY;  Service: Endoscopy;  Laterality: N/A;  with spyglass  . HERNIA REPAIR  05/05/11   Lap  L inguinal & obturator herniae, R Femoral Hernia  . MITRAL VALVE REPAIR  January 2002  . SPYGLASS CHOLANGIOSCOPY N/A 02/22/2013   Procedure: JJOACZYS CHOLANGIOSCOPY;  Surgeon: [name], MD;  Location: [facility] ENDOSCOPY;  Service: Endoscopy;  Laterality: N/A;  . SPYGLASS CHOLANGIOSCOPY N/A 09/21/2015   Procedure: AYTKZSWF CHOLANGIOSCOPY;  Surgeon: [name], MD;  Location: [place];  Service: Endoscopy;  Laterality: N/A;  Bess Kinds LITHOTRIPSY N/A 02/22/2013   Procedure: UXNATFTD LITHOTRIPSY;  Surgeon: [name], MD;  Location: [facility];  Service: Endoscopy;  Laterality: N/A;  litho ord'd 7/8 by DL/PO [id number] [facility]  . UPPER GASTROINTESTINAL ENDOSCOPY  02/22/13    Social History   Tobacco Use  Smoking Status Never Smoker  Smokeless Tobacco Never  Used    Social History   Substance and Sexual Activity  Alcohol Use Yes  . Alcohol/week: 1.0 - 2.0 standard drinks  . Types: 1 - 2 Glasses of wine per week   Comment: rare alcohol use    Family History  Problem Relation Age of Onset  . Stroke Father   . Stroke Mother        TIA    Review of Systems: As noted in history of present illness. All other systems were reviewed and are negative.  Physical Exam: There were no vitals taken for this visit. GENERAL:  Well appearing, elderly WM in NAD HEENT:  PERRL, EOMI, sclera are clear. Oropharynx is clear. NECK:  No jugular venous distention, carotid upstroke brisk and symmetric, no bruits, no thyromegaly or adenopathy LUNGS:  Clear to auscultation bilaterally CHEST:  Unremarkable HEART:  RRR with extrasystoles,  PMI not displaced or sustained,S1 and S2 within normal limits, no S3, no S4: there is a gr 2/6 systolic murmur RUSB radiating to the apex and bilateral carotids L>R ABD:  Soft, nontender. BS +, no masses or bruits. No hepatomegaly, no splenomegaly EXT:  2 + pulses throughout, no edema, no cyanosis no clubbing SKIN:  Warm and dry.  No rashes NEURO:  Alert and oriented x 3. Cranial nerves II through XII intact. PSYCH:  Cognitively intact    LABORATORY DATA: Lab Results  Component Value Date   WBC 7.4 09/04/2017   HGB 13.4 09/04/2017   HCT 39.0 09/04/2017   PLT 220 09/04/2017   GLUCOSE 96 09/04/2017   CHOL 112 09/04/2017   TRIG 72 09/04/2017   HDL 45 09/04/2017   LDLCALC 53 09/04/2017   ALT 9 09/04/2017   AST 20 09/04/2017   NA 142 09/04/2017   K 3.9 09/04/2017   CL 101 09/04/2017   CREATININE 0.95 09/04/2017   BUN 18 09/04/2017   CO2 27 09/04/2017   TSH 2.510 09/04/2017   INR 1.22 09/20/2015    Echo 02/15/16:Study Conclusions  - Left ventricle: The cavity size was mildly dilated. Wall   thickness was normal. Systolic function was normal. The estimated   ejection fraction was in the range of 50% to 55%.  The study is   not technically sufficient to allow evaluation of LV diastolic   function. - Aortic valve: There was very mild stenosis. There was trivial   regurgitation. - Mitral valve: Post MV repair with no significant residual MR. - Left atrium: The atrium was severely dilated. - Atrial septum: No defect or patent foramen ovale was identified  Assessment / Plan: 1. Mitral insufficiency status post mitral valve repair. Excellent long-term result. Echo in July 2017 showed excellent result.  2. Coronary disease status post single-vessel  coronary bypass surgery with a vein graft to the distal RCA due to refractory spasm at the time of surgery. He is asymptomatic. Continue ASA to 81 mg daily.  3. Hypertension, [name] pressure is mildly elevated today but has been well controlled.   4. Chronic PACs and PVCs. These are asymptomatic now. Wandering atrial pacemaker by prior Ecg (not Afib).  5. Aortic stenosis. Mild by Echo 2017.    I will follow up in 6 months.

## 2018-04-19 ENCOUNTER — Ambulatory Visit: Payer: Medicare Other | Admitting: Cardiology

## 2018-04-19 ENCOUNTER — Encounter: Payer: Self-pay | Admitting: Cardiology

## 2018-04-19 ENCOUNTER — Encounter

## 2018-04-19 VITALS — BP 151/73 | HR 63 | Ht 74.0 in | Wt 183.2 lb

## 2018-04-19 DIAGNOSIS — E7800 Pure hypercholesterolemia, unspecified: Secondary | ICD-10-CM | POA: Diagnosis not present

## 2018-04-19 DIAGNOSIS — Z9889 Other specified postprocedural states: Secondary | ICD-10-CM | POA: Diagnosis not present

## 2018-04-19 DIAGNOSIS — I493 Ventricular premature depolarization: Secondary | ICD-10-CM

## 2018-04-19 DIAGNOSIS — I10 Essential (primary) hypertension: Secondary | ICD-10-CM | POA: Diagnosis not present

## 2018-04-19 MED ORDER — VANCOMYCIN HCL 125 MG PO CAPS: 125.0000 mg | ORAL_CAPSULE | Freq: Four times a day (QID) | ORAL | 0 refills | Status: DC

## 2018-04-19 NOTE — Patient Instructions (Signed)
Continue your current therapy  I will see you in 6 months.   

## 2018-04-19 NOTE — Addendum Note (Signed)
Addended by: [name] on: 04/19/2018 08:23 AM   Modules accepted: Orders

## 2018-04-20 DIAGNOSIS — A0472 Enterocolitis due to Clostridium difficile, not specified as recurrent: Secondary | ICD-10-CM

## 2018-04-20 HISTORY — DX: Enterocolitis due to Clostridium difficile, not specified as recurrent: A04.72

## 2018-04-20 LAB — CDIFF NAA+O+P+STOOL CULTURE
CDIFFPCR: POSITIVE — AB
E COLI SHIGA TOXIN ASSAY: NEGATIVE
E coli, Shiga toxin Assay: NEGATIVE
Toxigenic C. Difficile by PCR: POSITIVE — AB

## 2018-04-21 ENCOUNTER — Telehealth: Payer: Self-pay

## 2018-04-21 NOTE — Telephone Encounter (Signed)
Pt called back and was advised that Dr. Redmond School sent in script for an abx due to c-diff and info would be mailed out . Found info from [facility] and place in out going . Pt came by and  picked it up. [place]

## 2018-04-30 ENCOUNTER — Other Ambulatory Visit: Payer: Self-pay

## 2018-04-30 MED ORDER — VALSARTAN-HYDROCHLOROTHIAZIDE 320-12.5 MG PO TABS: 1.0000 | ORAL_TABLET | Freq: Every morning | ORAL | 2 refills | Status: DC

## 2018-05-04 ENCOUNTER — Telehealth: Payer: Self-pay | Admitting: Family Medicine

## 2018-05-04 NOTE — Telephone Encounter (Signed)
Pt called states he has finished his antx about 3 days ago.  He is no longer having diarrhea.  Can he go back to his aquatics class and is he still contagious?  I disc with Dr. Redmond School, if he is no longer symptomatic he may begin his classes again.  Continue with general good hygiene and start a daily probiotic like activia or danon yogurt.    Pt is ok with all instructions and will let us know if symptoms come back.

## 2018-05-17 DIAGNOSIS — M6281 Muscle weakness (generalized): Secondary | ICD-10-CM | POA: Diagnosis not present

## 2018-05-17 DIAGNOSIS — R293 Abnormal posture: Secondary | ICD-10-CM | POA: Diagnosis not present

## 2018-05-17 DIAGNOSIS — M542 Cervicalgia: Secondary | ICD-10-CM | POA: Diagnosis not present

## 2018-05-17 DIAGNOSIS — M546 Pain in thoracic spine: Secondary | ICD-10-CM | POA: Diagnosis not present

## 2018-05-18 DIAGNOSIS — M47812 Spondylosis without myelopathy or radiculopathy, cervical region: Secondary | ICD-10-CM | POA: Diagnosis not present

## 2018-05-18 DIAGNOSIS — Z79899 Other long term (current) drug therapy: Secondary | ICD-10-CM | POA: Diagnosis not present

## 2018-05-18 DIAGNOSIS — M47818 Spondylosis without myelopathy or radiculopathy, sacral and sacrococcygeal region: Secondary | ICD-10-CM | POA: Diagnosis not present

## 2018-05-18 DIAGNOSIS — M4722 Other spondylosis with radiculopathy, cervical region: Secondary | ICD-10-CM | POA: Diagnosis not present

## 2018-05-24 DIAGNOSIS — M6281 Muscle weakness (generalized): Secondary | ICD-10-CM | POA: Diagnosis not present

## 2018-05-24 DIAGNOSIS — M546 Pain in thoracic spine: Secondary | ICD-10-CM | POA: Diagnosis not present

## 2018-05-24 DIAGNOSIS — M542 Cervicalgia: Secondary | ICD-10-CM | POA: Diagnosis not present

## 2018-05-24 DIAGNOSIS — R293 Abnormal posture: Secondary | ICD-10-CM | POA: Diagnosis not present

## 2018-05-31 DIAGNOSIS — R293 Abnormal posture: Secondary | ICD-10-CM | POA: Diagnosis not present

## 2018-05-31 DIAGNOSIS — M546 Pain in thoracic spine: Secondary | ICD-10-CM | POA: Diagnosis not present

## 2018-05-31 DIAGNOSIS — M542 Cervicalgia: Secondary | ICD-10-CM | POA: Diagnosis not present

## 2018-05-31 DIAGNOSIS — M6281 Muscle weakness (generalized): Secondary | ICD-10-CM | POA: Diagnosis not present

## 2018-06-04 ENCOUNTER — Other Ambulatory Visit: Payer: Self-pay | Admitting: *Deleted

## 2018-06-04 MED ORDER — AMLODIPINE BESYLATE 5 MG PO TABS: ORAL_TABLET | ORAL | 1 refills | Status: DC

## 2018-06-19 ENCOUNTER — Other Ambulatory Visit: Payer: Self-pay | Admitting: Family Medicine

## 2018-06-28 DIAGNOSIS — M546 Pain in thoracic spine: Secondary | ICD-10-CM | POA: Diagnosis not present

## 2018-06-28 DIAGNOSIS — R293 Abnormal posture: Secondary | ICD-10-CM | POA: Diagnosis not present

## 2018-06-28 DIAGNOSIS — M6281 Muscle weakness (generalized): Secondary | ICD-10-CM | POA: Diagnosis not present

## 2018-06-28 DIAGNOSIS — M542 Cervicalgia: Secondary | ICD-10-CM | POA: Diagnosis not present

## 2018-07-05 DIAGNOSIS — M546 Pain in thoracic spine: Secondary | ICD-10-CM | POA: Diagnosis not present

## 2018-07-05 DIAGNOSIS — R293 Abnormal posture: Secondary | ICD-10-CM | POA: Diagnosis not present

## 2018-07-05 DIAGNOSIS — M6281 Muscle weakness (generalized): Secondary | ICD-10-CM | POA: Diagnosis not present

## 2018-07-05 DIAGNOSIS — M542 Cervicalgia: Secondary | ICD-10-CM | POA: Diagnosis not present

## 2018-07-12 DIAGNOSIS — M546 Pain in thoracic spine: Secondary | ICD-10-CM | POA: Diagnosis not present

## 2018-07-12 DIAGNOSIS — R293 Abnormal posture: Secondary | ICD-10-CM | POA: Diagnosis not present

## 2018-07-12 DIAGNOSIS — M542 Cervicalgia: Secondary | ICD-10-CM | POA: Diagnosis not present

## 2018-07-12 DIAGNOSIS — M6281 Muscle weakness (generalized): Secondary | ICD-10-CM | POA: Diagnosis not present

## 2018-07-12 IMAGING — CR DG HIP (WITH OR WITHOUT PELVIS) 2-3V*R*
2 series · 2 of 2 positions shown · non-contrast
Comparison: None.

CLINICAL DATA: Increased right hip pain for the past 2 weeks. No
known injury.

EXAM:
DG HIP (WITH OR WITHOUT PELVIS) 2-3V RIGHT

[w hip ap right]
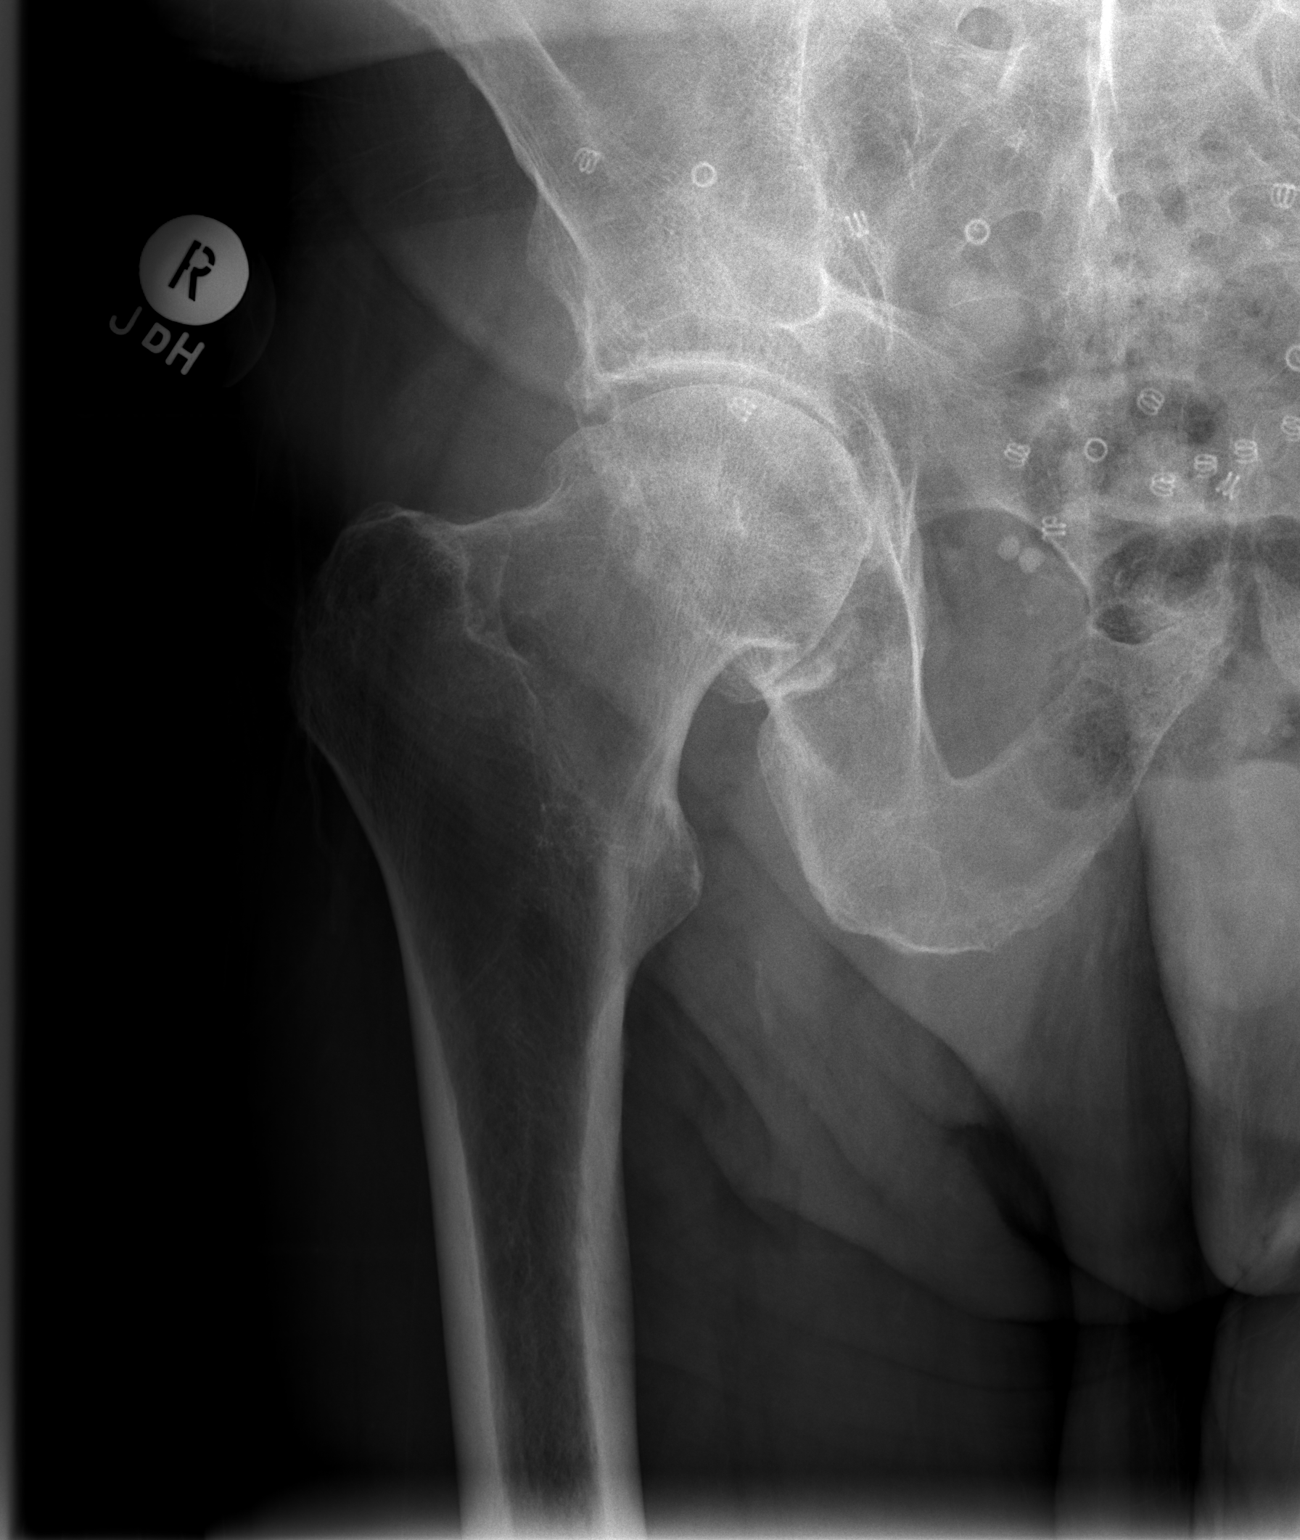

[w hip frog right]
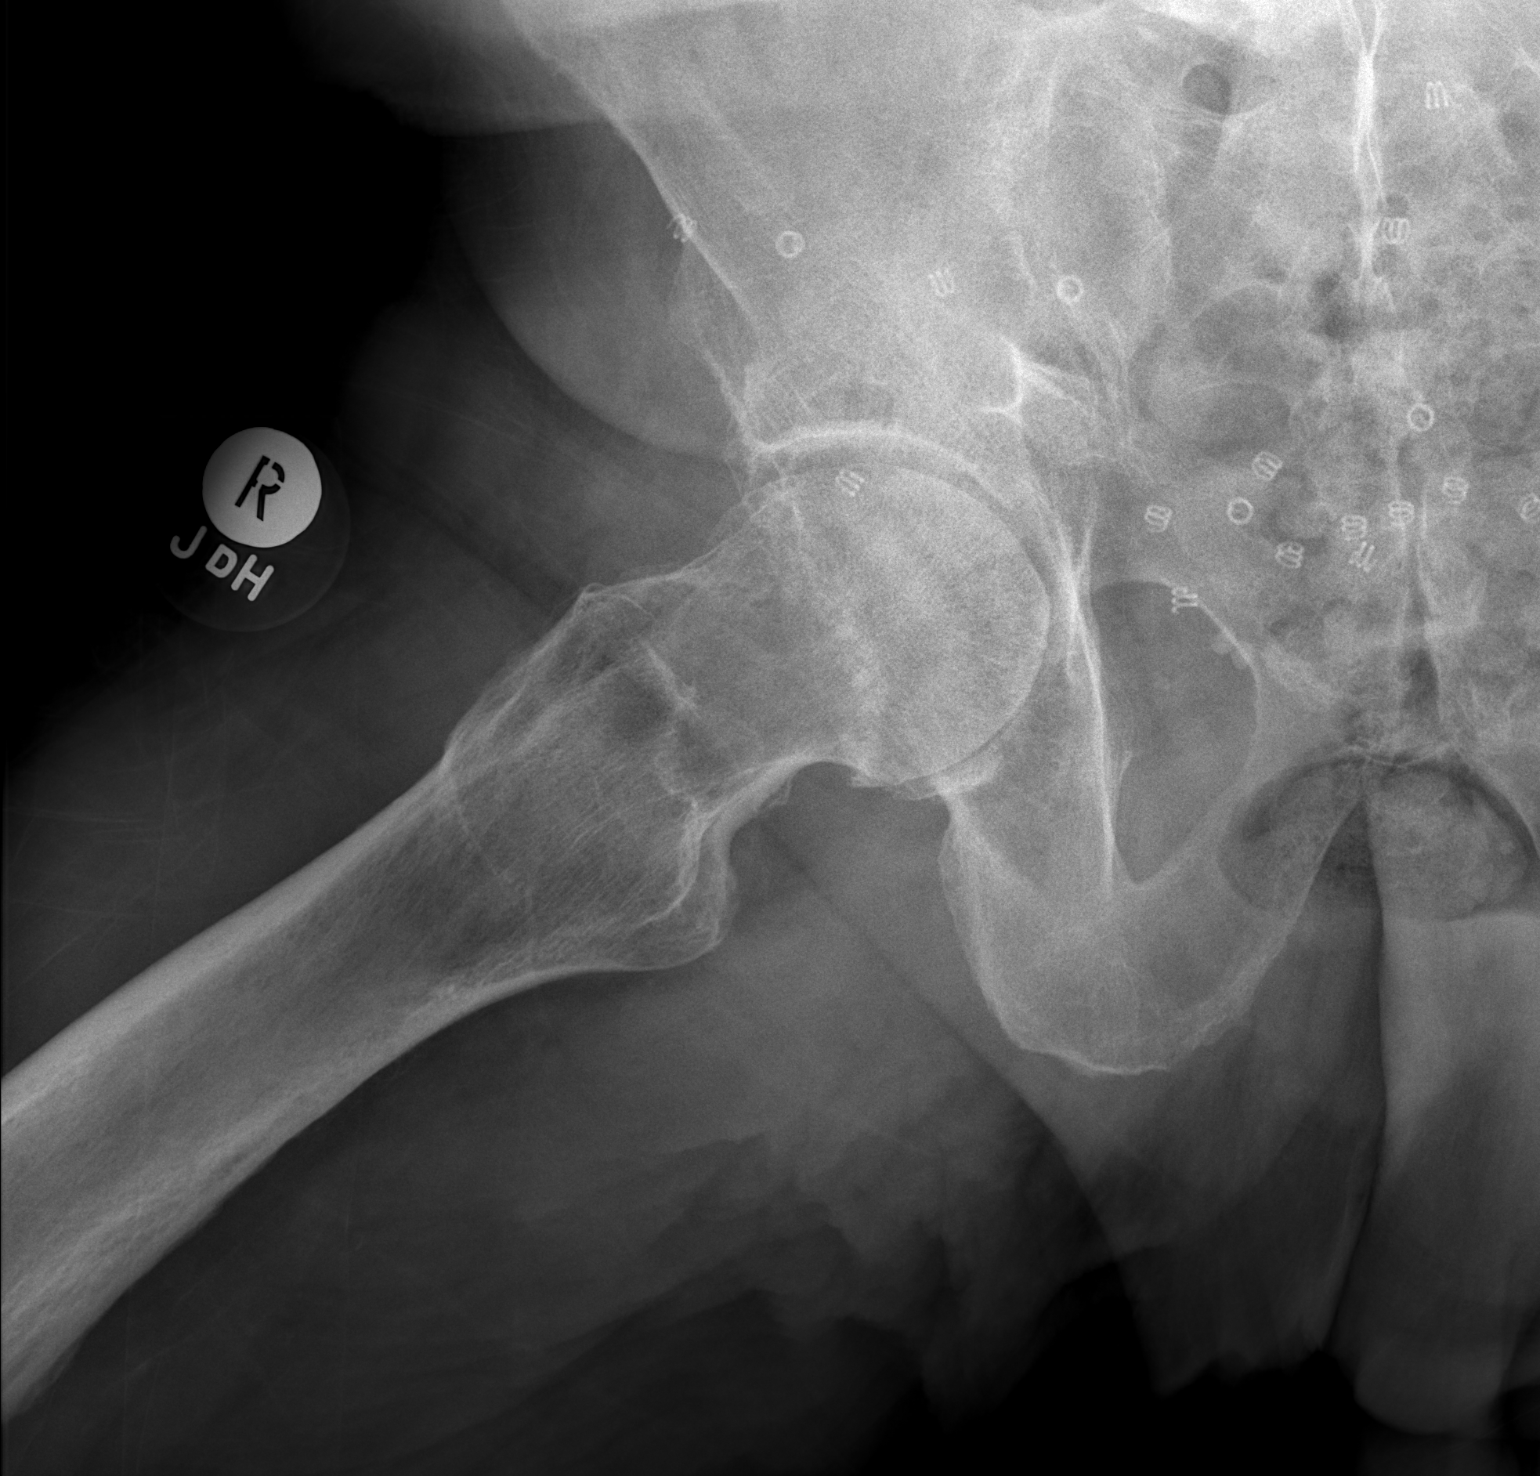

[2 of 2 positions shown; findings below may reference images not displayed]

FINDINGS: Moderate right hip degenerative changes. No evidence of avascular
necrosis. No fracture or dislocation. Diffuse osteopenia. Hernia
repair mesh metallic components.
IMPRESSION: 1. Moderate right hip degenerative changes.
2. No acute abnormality.

## 2018-07-14 DIAGNOSIS — Z79899 Other long term (current) drug therapy: Secondary | ICD-10-CM | POA: Diagnosis not present

## 2018-07-14 DIAGNOSIS — M4722 Other spondylosis with radiculopathy, cervical region: Secondary | ICD-10-CM | POA: Diagnosis not present

## 2018-07-14 DIAGNOSIS — M47818 Spondylosis without myelopathy or radiculopathy, sacral and sacrococcygeal region: Secondary | ICD-10-CM | POA: Diagnosis not present

## 2018-07-14 DIAGNOSIS — M47812 Spondylosis without myelopathy or radiculopathy, cervical region: Secondary | ICD-10-CM | POA: Diagnosis not present

## 2018-07-15 DIAGNOSIS — D2271 Melanocytic nevi of right lower limb, including hip: Secondary | ICD-10-CM | POA: Diagnosis not present

## 2018-07-15 DIAGNOSIS — L821 Other seborrheic keratosis: Secondary | ICD-10-CM | POA: Diagnosis not present

## 2018-07-15 DIAGNOSIS — Z85828 Personal history of other malignant neoplasm of skin: Secondary | ICD-10-CM | POA: Diagnosis not present

## 2018-07-15 DIAGNOSIS — L820 Inflamed seborrheic keratosis: Secondary | ICD-10-CM | POA: Diagnosis not present

## 2018-07-15 DIAGNOSIS — L853 Xerosis cutis: Secondary | ICD-10-CM | POA: Diagnosis not present

## 2018-07-15 DIAGNOSIS — L570 Actinic keratosis: Secondary | ICD-10-CM | POA: Diagnosis not present

## 2018-07-19 DIAGNOSIS — M546 Pain in thoracic spine: Secondary | ICD-10-CM | POA: Diagnosis not present

## 2018-07-19 DIAGNOSIS — M6281 Muscle weakness (generalized): Secondary | ICD-10-CM | POA: Diagnosis not present

## 2018-07-19 DIAGNOSIS — R293 Abnormal posture: Secondary | ICD-10-CM | POA: Diagnosis not present

## 2018-07-19 DIAGNOSIS — M542 Cervicalgia: Secondary | ICD-10-CM | POA: Diagnosis not present

## 2018-09-22 DIAGNOSIS — Z79899 Other long term (current) drug therapy: Secondary | ICD-10-CM | POA: Diagnosis not present

## 2018-09-22 DIAGNOSIS — M47812 Spondylosis without myelopathy or radiculopathy, cervical region: Secondary | ICD-10-CM | POA: Diagnosis not present

## 2018-09-22 DIAGNOSIS — Z79891 Long term (current) use of opiate analgesic: Secondary | ICD-10-CM | POA: Diagnosis not present

## 2018-09-22 DIAGNOSIS — G894 Chronic pain syndrome: Secondary | ICD-10-CM | POA: Diagnosis not present

## 2018-09-22 DIAGNOSIS — M4722 Other spondylosis with radiculopathy, cervical region: Secondary | ICD-10-CM | POA: Diagnosis not present

## 2018-09-22 DIAGNOSIS — M47818 Spondylosis without myelopathy or radiculopathy, sacral and sacrococcygeal region: Secondary | ICD-10-CM | POA: Diagnosis not present

## 2018-09-24 ENCOUNTER — Other Ambulatory Visit: Payer: Self-pay | Admitting: Family Medicine

## 2018-11-08 ENCOUNTER — Ambulatory Visit: Payer: Medicare Other | Admitting: Cardiology

## 2018-11-19 ENCOUNTER — Other Ambulatory Visit: Payer: Self-pay

## 2018-11-19 MED ORDER — AMLODIPINE BESYLATE 5 MG PO TABS: ORAL_TABLET | ORAL | 1 refills | Status: DC

## 2018-12-20 ENCOUNTER — Ambulatory Visit: Payer: Medicare Other | Admitting: Cardiology

## 2018-12-23 ENCOUNTER — Other Ambulatory Visit: Payer: Self-pay | Admitting: Family Medicine

## 2018-12-28 DIAGNOSIS — Z79899 Other long term (current) drug therapy: Secondary | ICD-10-CM | POA: Diagnosis not present

## 2018-12-28 DIAGNOSIS — M4722 Other spondylosis with radiculopathy, cervical region: Secondary | ICD-10-CM | POA: Diagnosis not present

## 2018-12-28 DIAGNOSIS — M47818 Spondylosis without myelopathy or radiculopathy, sacral and sacrococcygeal region: Secondary | ICD-10-CM | POA: Diagnosis not present

## 2018-12-28 DIAGNOSIS — M47812 Spondylosis without myelopathy or radiculopathy, cervical region: Secondary | ICD-10-CM | POA: Diagnosis not present

## 2019-01-04 ENCOUNTER — Telehealth: Payer: Self-pay | Admitting: Family Medicine

## 2019-01-04 NOTE — Telephone Encounter (Signed)
Pt left message that he is now having tremendous bowel problems, lots of gas, awful looking stools.  He said he had CDIFF last year and wonders if he has it again.  He lives at [place] and eats like a horse, however, he has lost 2 - 3 pounds.  He asked should he see his GI?  I spoke with Dr Redmond School and he agrees, patient should see his GI.  Called pt back and advised same.

## 2019-01-18 ENCOUNTER — Encounter: Payer: Self-pay | Admitting: Internal Medicine

## 2019-01-18 ENCOUNTER — Other Ambulatory Visit: Payer: Self-pay

## 2019-01-18 ENCOUNTER — Ambulatory Visit (INDEPENDENT_AMBULATORY_CARE_PROVIDER_SITE_OTHER): Payer: Medicare Other | Admitting: Internal Medicine

## 2019-01-18 DIAGNOSIS — K580 Irritable bowel syndrome with diarrhea: Secondary | ICD-10-CM | POA: Diagnosis not present

## 2019-01-18 DIAGNOSIS — A0472 Enterocolitis due to Clostridium difficile, not specified as recurrent: Secondary | ICD-10-CM

## 2019-01-18 DIAGNOSIS — K589 Irritable bowel syndrome without diarrhea: Secondary | ICD-10-CM | POA: Insufficient documentation

## 2019-01-18 MED ORDER — VANCOMYCIN HCL 125 MG PO CAPS: 125.0000 mg | ORAL_CAPSULE | Freq: Four times a day (QID) | ORAL | 0 refills | Status: DC

## 2019-01-18 NOTE — Progress Notes (Signed)
TELEHEALTH ENCOUNTER IN SETTING OF COVID-19 PANDEMIC - REQUESTED BY PATIENT SERVICE PROVIDED BY TELEMEDECINE - TYPE: telephone PATIENT LOCATION: home PATIENT HAS CONSENTED TO TELEHEALTH VISIT PROVIDER LOCATION: OFFICE REFERRING PROVIDER:[name], [name], MD PARTICIPANTS OTHER THAN PATIENT:none TIME SPENT ON CALL:16 mins    [name] [name] 85 y.o. 1930-04-24 [id number]  Assessment & Plan:  C. difficile colitis I think it is quite likely that he is having a recurrent C. difficile infection based upon the clinical scenario.  After discussions with him we have opted to go ahead and treat as if that were the case without testing.  If he responds to the antibiotics I think we will have our answer.  If she does not respond we will need additional stool testing again I think to see and if he has persistent bowel issues and a negative C. difficile then consider as per my IBS assessment and plan.  Meds ordered this encounter  Medications  . vancomycin (VANCOCIN) 125 MG capsule    Sig: Take 1 capsule (125 mg total) by mouth 4 (four) times daily.    Dispense:  40 capsule    Refill:  0   Follow-up with me around the time of completion of the antibiotics.  I have explained he should see improvement though maybe not resolution by about day 5.  IBS (irritable bowel syndrome) He does have a history of this, so keep in mind that could be the problem.  Since he responded to the vancomycin and a positive C. difficile PCR was noted last fall I think it is reasonable to retreat for that. Small intestinal bacterial overgrowth is also positive.  Vancomycin is not a typical treatment for that though I suppose it might help it it is not on the list of agents we typically use which is another reason why I think it is reasonable to retreat for possible recurrent C. Difficile. If he does fail to respond to the vancomycin consider bile salt sequestrant, consider small intestinal bacterial overgrowth testing.    I appreciate the opportunity to care for this patient. CC: [name], MD    Subjective:   Chief Complaint: Diarrhea and gas with a history of C. difficile  HPI The patient is an 85 year old white man with a history of irritable bowel syndrome with some diarrhea predominance, last seen by me in October 2017, with some urgent defecation and intermittent diarrhea.  In the fall of this past year, he was diagnosed with C. difficile by PCR test.  He was treated with vancomycin 125 mg 4 times daily for 10 days and the problems resolved.  At some point this year he is now back to having 2-4 loose stools a day with explosive gas.  Stools are never formed.  He is not sure how long this is been going on but he and his wife are certain that he did get better, released his wife as he is not so sure or cannot remember.  Phone notes reviewed in the chart show that he called back to Dr. [name] office and said that his diarrhea had resolved after treatment with the vancomycin.  He is lost a little bit of weight.  He has had some urge incontinence.  Terrible malodorous gas as well.  No bleeding reported.  I had tried pancreatic enzymes and he thought that he got diarrhea from that back in 2017. No Known Allergies Current Meds  Medication Sig  . amLODipine (NORVASC) 5 MG tablet take 1 tablet  by mouth once daily every evening  . aspirin 81 MG tablet Take 81 mg by mouth daily.  Marland Kitchen b complex vitamins tablet Take 1 tablet by mouth daily.  . Cholecalciferol (VITAMIN D) 1000 UNITS capsule Take 1,000 Units by mouth daily.    . Cholecalciferol (VITAMIN D3) 125 MCG (5000 UT) CAPS Take 1 capsule by mouth daily.  Marland Kitchen dutasteride (AVODART) 0.5 MG capsule Take 0.5 mg by mouth every evening.   . famotidine (PEPCID) 20 MG tablet Take 20 mg by mouth daily as needed for heartburn or indigestion.  . gabapentin (NEURONTIN) 300 MG capsule Take 300 mg by mouth 4 (four) times daily.  Marland Kitchen GLUCOSAMINE-CHONDROITIN PO Take 2  tablets by mouth daily.  Marland Kitchen HYDROcodone-acetaminophen (NORCO/VICODIN) 5-325 MG tablet Take 1 tablet by mouth every 6 (six) hours as needed.   Marland Kitchen MYRBETRIQ 50 MG TB24 tablet Take 1 tablet by mouth daily.  . rosuvastatin (CRESTOR) 20 MG tablet TAKE 1 TABLET BY MOUTH EVERY DAY  . Simethicone (GAS-X PO) Take 1 tablet by mouth daily as needed.  . Tamsulosin HCl (FLOMAX) 0.4 MG CAPS Take 0.4 mg by mouth every evening.   . valsartan-hydrochlorothiazide (DIOVAN-HCT) 320-12.5 MG tablet Take 1 tablet by mouth every morning.  . Wheat Dextrin (BENEFIBER PO) Take 1 Dose by mouth daily.   Past Medical History:  Diagnosis Date  . Arthritis   . BPH (benign prostatic hyperplasia)   . C. difficile colitis 04/20/2018  . CAD (coronary artery disease)    s/p CABG x 1  . DJD (degenerative joint disease)   . Gallstones   . HTN (hypertension)   . Hyperlipidemia   . Lumbar disc disease   . MVP (mitral valve prolapse)    s/p Mitral Valve Repair  . Pancreatitis   . Wandering (atrial) pacemaker 11/22/2013   with frequent multifocal ectopy (PACs, PVCs, junctional escape beats   Past Surgical History:  Procedure Laterality Date  . CHOLECYSTECTOMY     Dr. Lennie Hummer  . COLONOSCOPY  ~2008   no polyps per pt.  Dr. [name]  . CORONARY ARTERY BYPASS GRAFT  January 2002   Single bypass to the distal RCA  . ERCP N/A 02/22/2013   Procedure: ENDOSCOPIC RETROGRADE CHOLANGIOPANCREATOGRAPHY (ERCP);  Surgeon: [name], MD;  Location: [facility];  Service: Endoscopy;  Laterality: N/A;  . ERCP N/A 09/21/2015   Procedure: ENDOSCOPIC RETROGRADE CHOLANGIOPANCREATOGRAPHY (ERCP);  Surgeon: [name], MD;  Location: [facility] ENDOSCOPY;  Service: Endoscopy;  Laterality: N/A;  with spyglass  . HERNIA REPAIR  05/05/11   Lap L inguinal & obturator herniae, R Femoral Hernia  . MITRAL VALVE REPAIR  January 2002  . SPYGLASS CHOLANGIOSCOPY N/A 02/22/2013   Procedure: MVHQIONG CHOLANGIOSCOPY;  Surgeon: [name], MD;   Location: [facility] ENDOSCOPY;  Service: Endoscopy;  Laterality: N/A;  . SPYGLASS CHOLANGIOSCOPY N/A 09/21/2015   Procedure: EXBMWUXL CHOLANGIOSCOPY;  Surgeon: [name], MD;  Location: [place];  Service: Endoscopy;  Laterality: N/A;  Bess Kinds LITHOTRIPSY N/A 02/22/2013   Procedure: KGMWNUUV LITHOTRIPSY;  Surgeon: [name], MD;  Location: [facility] ENDOSCOPY;  Service: Endoscopy;  Laterality: N/A;  litho ord'd 7/8 by DL/PO [id number] [facility]  . UPPER GASTROINTESTINAL ENDOSCOPY  02/22/13   Social History   Social History Narrative   Married, retired from [employer], lives at [place]   Occasional alcohol not a smoker no drug use   2 children   family history includes Stroke in his father and mother.  Review of Systems As above

## 2019-01-18 NOTE — Assessment & Plan Note (Addendum)
I think it is quite likely that he is having a recurrent C. difficile infection based upon the clinical scenario.  After discussions with him we have opted to go ahead and treat as if that were the case without testing.  If he responds to the antibiotics I think we will have our answer.  If she does not respond we will need additional stool testing again I think to see and if he has persistent bowel issues and a negative C. difficile then consider as per my IBS assessment and plan.  Meds ordered this encounter  Medications  . vancomycin (VANCOCIN) 125 MG capsule    Sig: Take 1 capsule (125 mg total) by mouth 4 (four) times daily.    Dispense:  40 capsule    Refill:  0   Follow-up with me around the time of completion of the antibiotics.  I have explained he should see improvement though maybe not resolution by about day 5.

## 2019-01-18 NOTE — Patient Instructions (Addendum)
As we discussed, I/we have decided to retreat for C. difficile infection.  Vancomycin 125 mg 4 times a day for 10 days.   We will set you up for an another telehealth visit on June 22 or at least 1 day that week.  I hope this fixes things.  Please do not hesitate to contact me if you have questions or concerns prior to your follow-up.  I appreciate the opportunity to care for you. [name], MD, [facility]

## 2019-01-18 NOTE — Assessment & Plan Note (Addendum)
He does have a history of this, so keep in mind that could be the problem.  Since he responded to the vancomycin and a positive C. difficile PCR was noted last fall I think it is reasonable to retreat for that. Small intestinal bacterial overgrowth is also positive.  Vancomycin is not a typical treatment for that though I suppose it might help it it is not on the list of agents we typically use which is another reason why I think it is reasonable to retreat for possible recurrent C. Difficile. If he does fail to respond to the vancomycin consider bile salt sequestrant, consider small intestinal bacterial overgrowth testing.

## 2019-01-20 DIAGNOSIS — L570 Actinic keratosis: Secondary | ICD-10-CM | POA: Diagnosis not present

## 2019-01-20 DIAGNOSIS — Z85828 Personal history of other malignant neoplasm of skin: Secondary | ICD-10-CM | POA: Diagnosis not present

## 2019-01-20 DIAGNOSIS — L821 Other seborrheic keratosis: Secondary | ICD-10-CM | POA: Diagnosis not present

## 2019-01-21 ENCOUNTER — Other Ambulatory Visit: Payer: Self-pay | Admitting: Family Medicine

## 2019-01-24 ENCOUNTER — Other Ambulatory Visit: Payer: Self-pay

## 2019-01-24 ENCOUNTER — Telehealth: Payer: Self-pay | Admitting: Family Medicine

## 2019-01-24 MED ORDER — ROSUVASTATIN CALCIUM 20 MG PO TABS: 20.0000 mg | ORAL_TABLET | Freq: Every day | ORAL | 0 refills | Status: AC

## 2019-01-24 NOTE — Telephone Encounter (Signed)
[facility] requested Rosuvastatin 20 mg   # 90

## 2019-01-25 ENCOUNTER — Other Ambulatory Visit: Payer: Self-pay

## 2019-01-25 MED ORDER — VALSARTAN-HYDROCHLOROTHIAZIDE 320-12.5 MG PO TABS: 1.0000 | ORAL_TABLET | Freq: Every morning | ORAL | 2 refills | Status: AC

## 2019-01-31 ENCOUNTER — Encounter: Payer: Self-pay | Admitting: Internal Medicine

## 2019-01-31 ENCOUNTER — Ambulatory Visit (INDEPENDENT_AMBULATORY_CARE_PROVIDER_SITE_OTHER): Payer: Medicare Other | Admitting: Internal Medicine

## 2019-01-31 ENCOUNTER — Other Ambulatory Visit: Payer: Self-pay

## 2019-01-31 DIAGNOSIS — K30 Functional dyspepsia: Secondary | ICD-10-CM | POA: Diagnosis not present

## 2019-01-31 DIAGNOSIS — K589 Irritable bowel syndrome without diarrhea: Secondary | ICD-10-CM

## 2019-01-31 DIAGNOSIS — A0472 Enterocolitis due to Clostridium difficile, not specified as recurrent: Secondary | ICD-10-CM | POA: Diagnosis not present

## 2019-01-31 NOTE — Assessment & Plan Note (Addendum)
I have recommended to avoid snacking before bedtime, giving a 3-hour window or more.  Of also suggested he take Pepcid at bedtime for a week or 2 and then stop it and see how he does.  I think he is having some reflux symptomatology.  It should respond if it does not he will let me know.

## 2019-01-31 NOTE — Assessment & Plan Note (Signed)
Gas.flatulence bother him Try IB Donald Prose

## 2019-01-31 NOTE — Progress Notes (Signed)
See HPI    TELEHEALTH ENCOUNTER IN SETTING OF COVID-19 PANDEMIC - REQUESTED BY PATIENT SERVICE PROVIDED BY TELEMEDECINE - TYPE: Telephone PATIENT LOCATION: Home PATIENT HAS CONSENTED TO TELEHEALTH VISIT PROVIDER LOCATION: OFFICE REFERRING PROVIDER: Not applicable PARTICIPANTS OTHER THAN PATIENT: None TIME SPENT ON CALL: 15 minutes   [name] [name] 85 y.o. 07-05-1930 [id number]  Assessment & Plan:   C. difficile colitis Good response to vancomycin To call back if recurs and would do tapering dose  IBS (irritable bowel syndrome) Gas.flatulence bother him Try IB Gard  Indigestion Avastin to avoid snacking before bedtime, giving a 3-hour window or more.  Of also suggested he take Pepcid at bedtime for a week or 2 and then stop it and see how he does.  I think he is having some reflux symptomatology.  It should respond if it does not he will let me know.   I appreciate the opportunity to care for this patient. CC: [name], MD   Subjective:   Chief Complaint: Follow-up of C. difficile and diarrhea and gas  HPI [name] [name] reports that soon after starting vancomycin for diarrhea after the last visit of 01/18/2019 he had improvement with forming up of the stools and less gas.  Less "explosions".  He still suffers with gaseousness, his stools are forming up.  He really does feel like he is better but he passes a lot of gas and wishes that were not the case.  It is not malodorous.  Sometimes he bloats and belches too.  He has been using simethicone i.e. Gas-X but does not think it is helping too much.  This is really been somewhat chronic for him.  He is also having some indigestion at night.  He does not eat a large meal before going to bed but sometimes will have a snack less than 3 hours before retiring.  He will use Tums intermittently and Pepcid as needed intermittently.  He has not been taking Pepcid at bedtime.  No dysphagia or unintentional weight loss reported. No Known  Allergies Current Meds  Medication Sig  . amLODipine (NORVASC) 5 MG tablet take 1 tablet by mouth once daily every evening  . aspirin 81 MG tablet Take 81 mg by mouth daily.  Marland Kitchen b complex vitamins tablet Take 1 tablet by mouth daily.  . Cholecalciferol (VITAMIN D) 1000 UNITS capsule Take 1,000 Units by mouth daily.    . Cholecalciferol (VITAMIN D3) 125 MCG (5000 UT) CAPS Take 1 capsule by mouth daily.  . Cyanocobalamin (B-12 PO) Take 1 tablet by mouth daily.  Marland Kitchen dutasteride (AVODART) 0.5 MG capsule Take 0.5 mg by mouth every evening.   . famotidine (PEPCID) 20 MG tablet Take 20 mg by mouth daily as needed for heartburn or indigestion.  . gabapentin (NEURONTIN) 300 MG capsule Take 300 mg by mouth 4 (four) times daily.  Marland Kitchen GLUCOSAMINE-CHONDROITIN PO Take 2 tablets by mouth daily.  Marland Kitchen HYDROcodone-acetaminophen (NORCO/VICODIN) 5-325 MG tablet Take 1 tablet by mouth every 6 (six) hours as needed.   Marland Kitchen MYRBETRIQ 50 MG TB24 tablet Take 1 tablet by mouth daily.  . rosuvastatin (CRESTOR) 20 MG tablet TAKE 1 TABLET BY MOUTH EVERY DAY  . rosuvastatin (CRESTOR) 20 MG tablet Take 1 tablet (20 mg total) by mouth daily.  . Simethicone (GAS-X PO) Take 1 tablet by mouth daily as needed.  . Tamsulosin HCl (FLOMAX) 0.4 MG CAPS Take 0.4 mg by mouth every evening.   . valsartan-hydrochlorothiazide (DIOVAN-HCT) 320-12.5 MG tablet Take  1 tablet by mouth every morning.  . Wheat Dextrin (BENEFIBER PO) Take 1 Dose by mouth daily.  . [DISCONTINUED] vancomycin (VANCOCIN) 125 MG capsule Take 1 capsule (125 mg total) by mouth 4 (four) times daily.   Past Medical History:  Diagnosis Date  . Arthritis   . BPH (benign prostatic hyperplasia)   . C. difficile colitis 04/20/2018  . CAD (coronary artery disease)    s/p CABG x 1  . DJD (degenerative joint disease)   . Gallstones   . HTN (hypertension)   . Hyperlipidemia   . Lumbar disc disease   . MVP (mitral valve prolapse)    s/p Mitral Valve Repair  . Pancreatitis   .  Wandering (atrial) pacemaker 11/22/2013   with frequent multifocal ectopy (PACs, PVCs, junctional escape beats   Past Surgical History:  Procedure Laterality Date  . CHOLECYSTECTOMY     Dr. Lennie Hummer  . COLONOSCOPY  ~2008   no polyps per pt.  Dr. [name]  . CORONARY ARTERY BYPASS GRAFT  January 2002   Single bypass to the distal RCA  . ERCP N/A 02/22/2013   Procedure: ENDOSCOPIC RETROGRADE CHOLANGIOPANCREATOGRAPHY (ERCP);  Surgeon: [name], MD;  Location: [facility];  Service: Endoscopy;  Laterality: N/A;  . ERCP N/A 09/21/2015   Procedure: ENDOSCOPIC RETROGRADE CHOLANGIOPANCREATOGRAPHY (ERCP);  Surgeon: [name], MD;  Location: [facility] ENDOSCOPY;  Service: Endoscopy;  Laterality: N/A;  with spyglass  . HERNIA REPAIR  05/05/11   Lap L inguinal & obturator herniae, R Femoral Hernia  . MITRAL VALVE REPAIR  January 2002  . SPYGLASS CHOLANGIOSCOPY N/A 02/22/2013   Procedure: ACZYSAYT CHOLANGIOSCOPY;  Surgeon: [name], MD;  Location: [facility] ENDOSCOPY;  Service: Endoscopy;  Laterality: N/A;  . SPYGLASS CHOLANGIOSCOPY N/A 09/21/2015   Procedure: KZSWFUXN CHOLANGIOSCOPY;  Surgeon: [name], MD;  Location: [place];  Service: Endoscopy;  Laterality: N/A;  Bess Kinds LITHOTRIPSY N/A 02/22/2013   Procedure: ATFTDDUK LITHOTRIPSY;  Surgeon: [name], MD;  Location: [facility] ENDOSCOPY;  Service: Endoscopy;  Laterality: N/A;  litho ord'd 7/8 by DL/PO [phone] [facility]  . UPPER GASTROINTESTINAL ENDOSCOPY  02/22/13   Social History   Social History Narrative   Married, retired from [employer], lives at [employer]   Occasional alcohol not a smoker no drug use   2 children   family history includes Stroke in his father and mother.   Review of Systems

## 2019-01-31 NOTE — Assessment & Plan Note (Signed)
Good response to vancomycin To call back if recurs and would do tapering dose

## 2019-01-31 NOTE — Patient Instructions (Signed)
I am glad you are feeling better.  If the diarrhea returns as prior to the vancomycin please let me know.  Try taking the famotidine also known as Pepcid at bedtime for a week or 2 and avoid food intake for the 3 hours prior to retiring into bed.  Something we did not discuss as you could elevate the head of your bed, if that is capable with the bed go ahead and do that.  You may also buy a wedge pillow made out of foam.  Gravity will help this sort of thing also.   I have recommended you try IB Gard for the gas.  As we discussed you may purchase this over-the-counter at a drugstore.  I hope you stay improved and have further success with the above recommendations.  I appreciate the opportunity to care for you. [name], MD, [facility]

## 2019-02-02 ENCOUNTER — Telehealth: Payer: Self-pay | Admitting: Internal Medicine

## 2019-02-02 NOTE — Telephone Encounter (Signed)
Patient called said that he would like some samples for ibgard and he would stop by tomorrow to pick them up

## 2019-02-02 NOTE — Telephone Encounter (Signed)
I told him I have put them up front for him to pick up.

## 2019-02-15 ENCOUNTER — Telehealth: Payer: Self-pay | Admitting: Family Medicine

## 2019-02-15 NOTE — Telephone Encounter (Signed)
I did talk to the patient.  He is having some lightheaded feeling as well as hypersomnia.  His blood pressure was apparently good.  His pulse was also okay.  No fever, chills, weakness, numbness, blurred or double vision or speech problems.  Recommended at this point that he do watchful waiting and see what happens.  He will make an appointment if he continues have trouble.

## 2019-02-15 NOTE — Telephone Encounter (Signed)
Please advise. Pt called and stated that he has been light headed lately and he can sit down for a minute and he falls asleep.

## 2019-02-22 DIAGNOSIS — Z79899 Other long term (current) drug therapy: Secondary | ICD-10-CM | POA: Diagnosis not present

## 2019-02-22 DIAGNOSIS — M4722 Other spondylosis with radiculopathy, cervical region: Secondary | ICD-10-CM | POA: Diagnosis not present

## 2019-02-22 DIAGNOSIS — M47818 Spondylosis without myelopathy or radiculopathy, sacral and sacrococcygeal region: Secondary | ICD-10-CM | POA: Diagnosis not present

## 2019-02-22 DIAGNOSIS — M47812 Spondylosis without myelopathy or radiculopathy, cervical region: Secondary | ICD-10-CM | POA: Diagnosis not present

## 2019-02-28 ENCOUNTER — Telehealth: Payer: Self-pay | Admitting: Internal Medicine

## 2019-02-28 DIAGNOSIS — R1013 Epigastric pain: Secondary | ICD-10-CM

## 2019-02-28 DIAGNOSIS — H52203 Unspecified astigmatism, bilateral: Secondary | ICD-10-CM | POA: Diagnosis not present

## 2019-02-28 DIAGNOSIS — H26493 Other secondary cataract, bilateral: Secondary | ICD-10-CM | POA: Diagnosis not present

## 2019-02-28 DIAGNOSIS — R1011 Right upper quadrant pain: Secondary | ICD-10-CM

## 2019-02-28 NOTE — Telephone Encounter (Signed)
Patient reports that he has had 3 episodes of "intense" epigastric pain that lasted in varying lengths.  First time was about 1 minute, second episode approximately 3 minutes, today 15 minutes.  Comes on very quickly and resolves very quickly.  He reports he feels fine now. Pain is similar to the pain he as having in 2017 at the time of ERCP and found to have sludge in bile duct. .  He does not have any itching, fever, jaundice.  Please advise.

## 2019-03-01 ENCOUNTER — Other Ambulatory Visit: Payer: Self-pay

## 2019-03-01 ENCOUNTER — Other Ambulatory Visit (INDEPENDENT_AMBULATORY_CARE_PROVIDER_SITE_OTHER): Payer: Medicare Other

## 2019-03-01 ENCOUNTER — Telehealth: Payer: Self-pay | Admitting: Internal Medicine

## 2019-03-01 DIAGNOSIS — K8050 Calculus of bile duct without cholangitis or cholecystitis without obstruction: Secondary | ICD-10-CM

## 2019-03-01 DIAGNOSIS — R1013 Epigastric pain: Secondary | ICD-10-CM | POA: Diagnosis not present

## 2019-03-01 DIAGNOSIS — R1011 Right upper quadrant pain: Secondary | ICD-10-CM

## 2019-03-01 LAB — HEPATIC FUNCTION PANEL
ALT: 96 U/L — ABNORMAL HIGH (ref 0–53)
AST: 197 U/L — ABNORMAL HIGH (ref 0–37)
Albumin: 4 g/dL (ref 3.5–5.2)
Alkaline Phosphatase: 403 U/L — ABNORMAL HIGH (ref 39–117)
Bilirubin, Direct: 1.2 mg/dL — ABNORMAL HIGH (ref 0.0–0.3)
Total Bilirubin: 2.5 mg/dL — ABNORMAL HIGH (ref 0.2–1.2)
Total Protein: 7 g/dL (ref 6.0–8.3)

## 2019-03-01 IMAGING — CR DG SI JOINTS 3+V
3 series · 3 of 3 positions shown · non-contrast
Comparison: None.

CLINICAL DATA: Lower back pain for 2 months.

EXAM:
BILATERAL SACROILIAC JOINTS - 3+ VIEW

[t sacroiliac joints (1 of 3)]
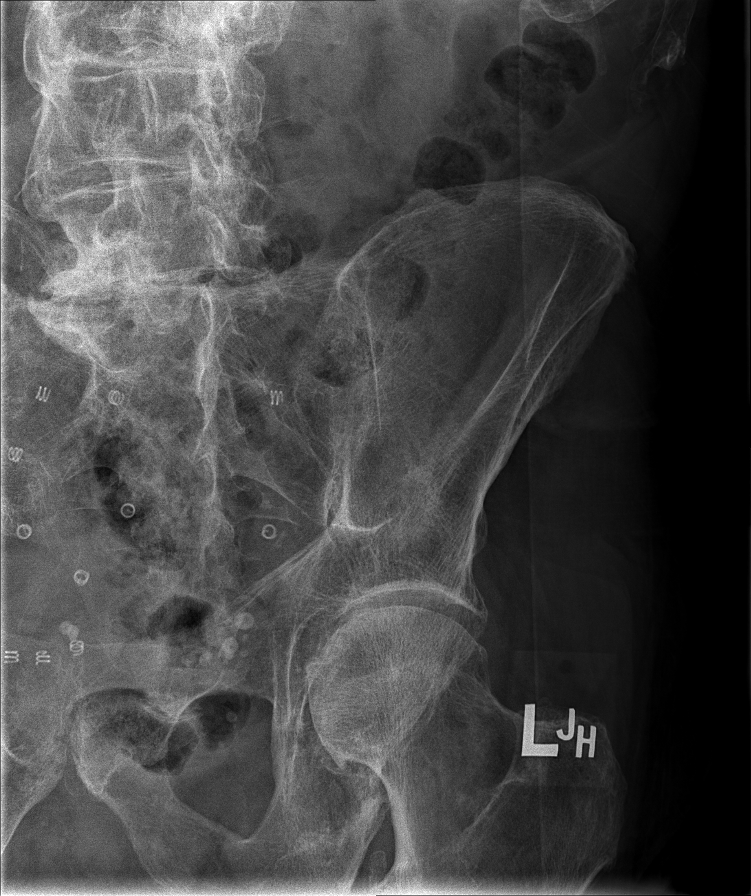

[t sacroiliac joints (2 of 3)]
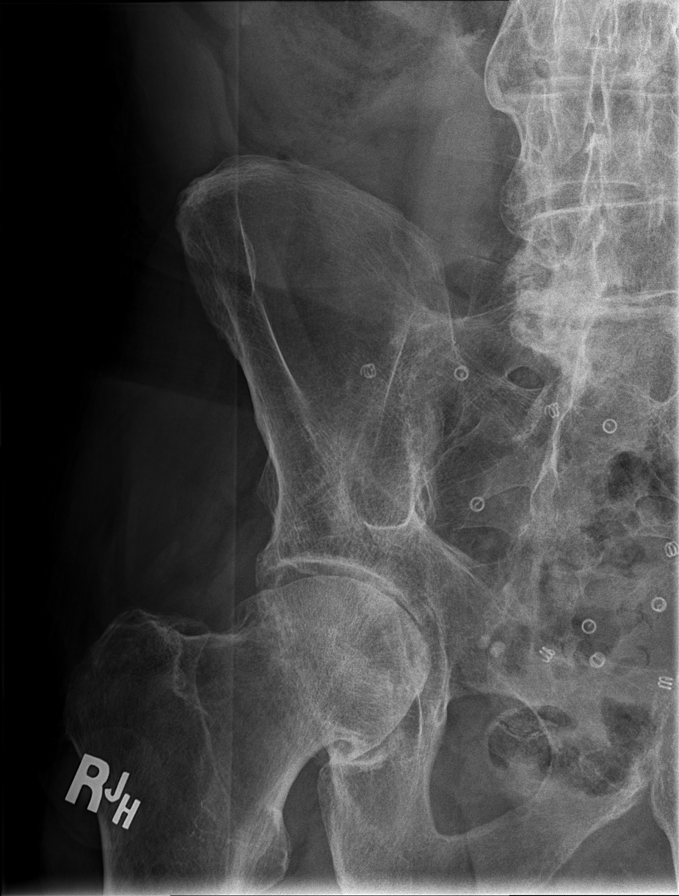

[t sacroiliac joints (3 of 3)]
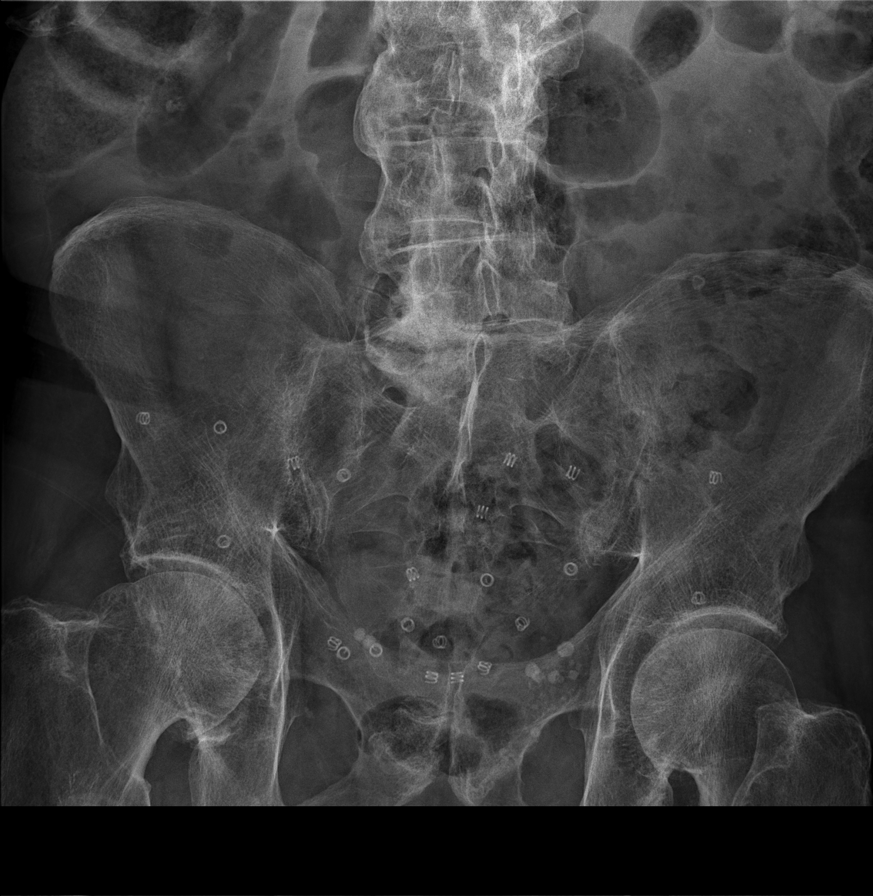

[3 of 3 positions shown; findings below may reference images not displayed]

FINDINGS: There does appear to be sclerosis involving the superior portion of
the right sacroiliac joint, with mild sclerosis involving the
inferior portion the left sacroiliac joint. Deformity of right
femoral head is noted suggesting possible avascular necrosis. No
fracture or dislocation is noted. Significant degenerative changes
seen involving the right side of the L5-S1 disc space.
IMPRESSION: Possible avascular necrosis of right femoral head. Mild degenerative
changes are seen involving both sacroiliac joints. Significant
degenerative joint disease of right side of L5-S1 disc space is
noted.

## 2019-03-01 MED ORDER — CIPROFLOXACIN HCL 500 MG PO TABS: 500.0000 mg | ORAL_TABLET | Freq: Two times a day (BID) | ORAL | 0 refills | Status: DC

## 2019-03-01 NOTE — Telephone Encounter (Signed)
I saw that  Here is what I would like to try to accomplish  Cipro 500 mg bid # 14   Cancel [place]  Go for ERCP see if I can get it on for Friday AM at [facility] hospitals  Will need some hep with Covid-19 testing so we get result in time I think

## 2019-03-01 NOTE — Telephone Encounter (Signed)
Patient notified of the recommendations  He is scheduled for ERCp at [facility] on 03/04/19.  He will go for COVID screen first thing in the am.  Per Santiago Glad at Endo ok to test tomorrow. He is asked to be NPO after midnight and arrive at 6:30.  He understands to pick up his antibiotics, have his covid screen in the am then quarantine at home until the ERCP on Friday.

## 2019-03-01 NOTE — Telephone Encounter (Signed)
[place] scheduled for 03/04/19 10:00.  He is advised to be NPO after midnight. He is notified of recommendations.  He will come for labs today or tomorrow.

## 2019-03-01 NOTE — Telephone Encounter (Signed)
Patient did come for labs and hepatic panel is abnormal.  He is not having chills.  [place] is scheduled for Friday.  Please advise

## 2019-03-01 NOTE — Telephone Encounter (Signed)
Please order LFT's and a RUQ [place] to evaluate these complaints

## 2019-03-02 ENCOUNTER — Other Ambulatory Visit (HOSPITAL_COMMUNITY)
Admission: RE | Admit: 2019-03-02 | Discharge: 2019-03-02 | Disposition: A | Discharge status: Home / Self Care | Payer: Medicare Other | Source: Ambulatory Visit | Attending: Internal Medicine | Admitting: Internal Medicine

## 2019-03-02 ENCOUNTER — Other Ambulatory Visit: Payer: Self-pay

## 2019-03-02 ENCOUNTER — Encounter (HOSPITAL_COMMUNITY): Payer: Self-pay | Admitting: *Deleted

## 2019-03-02 DIAGNOSIS — Z1159 Encounter for screening for other viral diseases: Secondary | ICD-10-CM | POA: Diagnosis not present

## 2019-03-02 LAB — SARS CORONAVIRUS 2 (TAT 6-24 HRS): SARS Coronavirus 2: NEGATIVE

## 2019-03-03 ENCOUNTER — Other Ambulatory Visit: Payer: Self-pay

## 2019-03-03 ENCOUNTER — Inpatient Hospital Stay (HOSPITAL_COMMUNITY)
Admission: EM | Admit: 2019-03-03 | Discharge: 2019-03-05 | DRG: 445 | Disposition: A | Discharge status: Home / Self Care | Payer: Medicare Other | Attending: Internal Medicine | Admitting: Internal Medicine

## 2019-03-03 ENCOUNTER — Telehealth: Payer: Self-pay | Admitting: Internal Medicine

## 2019-03-03 ENCOUNTER — Encounter (HOSPITAL_COMMUNITY): Payer: Self-pay

## 2019-03-03 DIAGNOSIS — R945 Abnormal results of liver function studies: Secondary | ICD-10-CM

## 2019-03-03 DIAGNOSIS — M1990 Unspecified osteoarthritis, unspecified site: Secondary | ICD-10-CM | POA: Diagnosis present

## 2019-03-03 DIAGNOSIS — I2510 Atherosclerotic heart disease of native coronary artery without angina pectoris: Secondary | ICD-10-CM | POA: Diagnosis present

## 2019-03-03 DIAGNOSIS — Z7982 Long term (current) use of aspirin: Secondary | ICD-10-CM

## 2019-03-03 DIAGNOSIS — E876 Hypokalemia: Secondary | ICD-10-CM | POA: Diagnosis not present

## 2019-03-03 DIAGNOSIS — Z9049 Acquired absence of other specified parts of digestive tract: Secondary | ICD-10-CM | POA: Diagnosis not present

## 2019-03-03 DIAGNOSIS — G629 Polyneuropathy, unspecified: Secondary | ICD-10-CM | POA: Diagnosis not present

## 2019-03-03 DIAGNOSIS — I10 Essential (primary) hypertension: Secondary | ICD-10-CM | POA: Diagnosis present

## 2019-03-03 DIAGNOSIS — K831 Obstruction of bile duct: Secondary | ICD-10-CM | POA: Diagnosis not present

## 2019-03-03 DIAGNOSIS — R42 Dizziness and giddiness: Secondary | ICD-10-CM | POA: Diagnosis not present

## 2019-03-03 DIAGNOSIS — K8050 Calculus of bile duct without cholangitis or cholecystitis without obstruction: Secondary | ICD-10-CM

## 2019-03-03 DIAGNOSIS — K589 Irritable bowel syndrome without diarrhea: Secondary | ICD-10-CM | POA: Diagnosis present

## 2019-03-03 DIAGNOSIS — Z823 Family history of stroke: Secondary | ICD-10-CM

## 2019-03-03 DIAGNOSIS — K8031 Calculus of bile duct with cholangitis, unspecified, with obstruction: Principal | ICD-10-CM

## 2019-03-03 DIAGNOSIS — N179 Acute kidney failure, unspecified: Secondary | ICD-10-CM

## 2019-03-03 DIAGNOSIS — A0472 Enterocolitis due to Clostridium difficile, not specified as recurrent: Secondary | ICD-10-CM

## 2019-03-03 DIAGNOSIS — E785 Hyperlipidemia, unspecified: Secondary | ICD-10-CM | POA: Diagnosis not present

## 2019-03-03 DIAGNOSIS — Z1159 Encounter for screening for other viral diseases: Secondary | ICD-10-CM | POA: Diagnosis not present

## 2019-03-03 DIAGNOSIS — Z951 Presence of aortocoronary bypass graft: Secondary | ICD-10-CM | POA: Diagnosis not present

## 2019-03-03 DIAGNOSIS — N400 Enlarged prostate without lower urinary tract symptoms: Secondary | ICD-10-CM | POA: Diagnosis present

## 2019-03-03 LAB — MAGNESIUM: Magnesium: 1.6 mg/dL — ABNORMAL LOW (ref 1.7–2.4)

## 2019-03-03 LAB — URINALYSIS, ROUTINE W REFLEX MICROSCOPIC
Bacteria, UA: NONE SEEN
Bilirubin Urine: NEGATIVE
Glucose, UA: NEGATIVE mg/dL
Hgb urine dipstick: NEGATIVE
Ketones, ur: 5 mg/dL — AB
Leukocytes,Ua: NEGATIVE
Nitrite: NEGATIVE
Protein, ur: 100 mg/dL — AB
Specific Gravity, Urine: 1.021 (ref 1.005–1.030)
pH: 5 (ref 5.0–8.0)

## 2019-03-03 LAB — CBC WITH DIFFERENTIAL/PLATELET
Abs Immature Granulocytes: 0.02 10*3/uL (ref 0.00–0.07)
Basophils Absolute: 0 10*3/uL (ref 0.0–0.1)
Basophils Relative: 0 %
Eosinophils Absolute: 0 10*3/uL (ref 0.0–0.5)
Eosinophils Relative: 0 %
HCT: 38.5 % — ABNORMAL LOW (ref 39.0–52.0)
Hemoglobin: 12.5 g/dL — ABNORMAL LOW (ref 13.0–17.0)
Immature Granulocytes: 0 %
Lymphocytes Relative: 6 %
Lymphs Abs: 0.4 10*3/uL — ABNORMAL LOW (ref 0.7–4.0)
MCH: 30 pg (ref 26.0–34.0)
MCHC: 32.5 g/dL (ref 30.0–36.0)
MCV: 92.3 fL (ref 80.0–100.0)
Monocytes Absolute: 0.5 10*3/uL (ref 0.1–1.0)
Monocytes Relative: 7 %
Neutro Abs: 5.8 10*3/uL (ref 1.7–7.7)
Neutrophils Relative %: 87 %
Platelets: 146 10*3/uL — ABNORMAL LOW (ref 150–400)
RBC: 4.17 MIL/uL — ABNORMAL LOW (ref 4.22–5.81)
RDW: 13.6 % (ref 11.5–15.5)
WBC: 6.8 10*3/uL (ref 4.0–10.5)
nRBC: 0 % (ref 0.0–0.2)

## 2019-03-03 LAB — COMPREHENSIVE METABOLIC PANEL
ALT: 183 U/L — ABNORMAL HIGH (ref 0–44)
AST: 161 U/L — ABNORMAL HIGH (ref 15–41)
Albumin: 3.3 g/dL — ABNORMAL LOW (ref 3.5–5.0)
Alkaline Phosphatase: 446 U/L — ABNORMAL HIGH (ref 38–126)
Anion gap: 11 (ref 5–15)
BUN: 19 mg/dL (ref 8–23)
CO2: 27 mmol/L (ref 22–32)
Calcium: 8.1 mg/dL — ABNORMAL LOW (ref 8.9–10.3)
Chloride: 99 mmol/L (ref 98–111)
Creatinine, Ser: 1.34 mg/dL — ABNORMAL HIGH (ref 0.61–1.24)
GFR calc Af Amer: 55 mL/min — ABNORMAL LOW (ref >60)
GFR calc non Af Amer: 48 mL/min — ABNORMAL LOW (ref >60)
Glucose, Bld: 115 mg/dL — ABNORMAL HIGH (ref 70–99)
Potassium: 2.8 mmol/L — ABNORMAL LOW (ref 3.5–5.1)
Sodium: 137 mmol/L (ref 135–145)
Total Bilirubin: 1.9 mg/dL — ABNORMAL HIGH (ref 0.3–1.2)
Total Protein: 6.6 g/dL (ref 6.5–8.1)

## 2019-03-03 LAB — LIPASE, BLOOD: Lipase: 27 U/L (ref 11–51)

## 2019-03-03 LAB — SARS CORONAVIRUS 2 BY RT PCR (HOSPITAL ORDER, PERFORMED IN ~~LOC~~ HOSPITAL LAB): SARS Coronavirus 2: NEGATIVE

## 2019-03-03 MED ORDER — POTASSIUM CHLORIDE CRYS ER 20 MEQ PO TBCR
40.0000 meq | EXTENDED_RELEASE_TABLET | Freq: Once | ORAL | Status: AC
  Administered 2019-03-03: 17:00:00 40 meq via ORAL
  Filled 2019-03-03: qty 2

## 2019-03-03 MED ORDER — GABAPENTIN 300 MG PO CAPS
300.0000 mg | ORAL_CAPSULE | Freq: Four times a day (QID) | ORAL | Status: DC
  Administered 2019-03-03 – 2019-03-05 (×8): 300 mg via ORAL
  Filled 2019-03-03 (×8): qty 1

## 2019-03-03 MED ORDER — MIRABEGRON ER 25 MG PO TB24
50.0000 mg | ORAL_TABLET | Freq: Every day | ORAL | Status: DC
  Administered 2019-03-03 – 2019-03-04 (×2): 50 mg via ORAL
  Filled 2019-03-03 (×3): qty 2

## 2019-03-03 MED ORDER — POTASSIUM CHLORIDE 10 MEQ/100ML IV SOLN
10.0000 meq | INTRAVENOUS | Status: AC
  Administered 2019-03-03 (×3): 10 meq via INTRAVENOUS
  Filled 2019-03-03 (×3): qty 100

## 2019-03-03 MED ORDER — POTASSIUM CHLORIDE CRYS ER 20 MEQ PO TBCR
40.0000 meq | EXTENDED_RELEASE_TABLET | Freq: Two times a day (BID) | ORAL | Status: DC
  Administered 2019-03-03 – 2019-03-04 (×2): 40 meq via ORAL
  Filled 2019-03-03 (×2): qty 2

## 2019-03-03 MED ORDER — SODIUM CHLORIDE 0.9 % IV SOLN
2.0000 g | INTRAVENOUS | Status: DC
  Administered 2019-03-03 – 2019-03-04 (×2): 2 g via INTRAVENOUS
  Filled 2019-03-03: qty 2
  Filled 2019-03-03 (×2): qty 20

## 2019-03-03 MED ORDER — DUTASTERIDE 0.5 MG PO CAPS
0.5000 mg | ORAL_CAPSULE | Freq: Every evening | ORAL | Status: DC
  Administered 2019-03-03 – 2019-03-04 (×2): 0.5 mg via ORAL
  Filled 2019-03-03 (×3): qty 1

## 2019-03-03 MED ORDER — POLYETHYL GLYCOL-PROPYL GLYCOL 0.4-0.3 % OP GEL: Freq: Every day | OPHTHALMIC | Status: DC | PRN

## 2019-03-03 MED ORDER — HYDROCODONE-ACETAMINOPHEN 5-325 MG PO TABS: 1.0000 | ORAL_TABLET | Freq: Four times a day (QID) | ORAL | Status: DC | PRN

## 2019-03-03 MED ORDER — VANCOMYCIN 50 MG/ML ORAL SOLUTION
125.0000 mg | Freq: Four times a day (QID) | ORAL | Status: DC
  Administered 2019-03-03 (×2): 125 mg via ORAL
  Filled 2019-03-03 (×4): qty 2.5

## 2019-03-03 MED ORDER — FAMOTIDINE 20 MG PO TABS
20.0000 mg | ORAL_TABLET | Freq: Two times a day (BID) | ORAL | Status: DC
  Administered 2019-03-03 – 2019-03-05 (×4): 20 mg via ORAL
  Filled 2019-03-03 (×4): qty 1

## 2019-03-03 MED ORDER — SODIUM CHLORIDE 0.9 % IV BOLUS
1000.0000 mL | Freq: Once | INTRAVENOUS | Status: AC
  Administered 2019-03-03: 1000 mL via INTRAVENOUS

## 2019-03-03 MED ORDER — ACETAMINOPHEN 500 MG PO TABS
500.0000 mg | ORAL_TABLET | Freq: Four times a day (QID) | ORAL | Status: DC | PRN
  Administered 2019-03-05: 500 mg via ORAL
  Filled 2019-03-03: qty 1

## 2019-03-03 MED ORDER — MORPHINE SULFATE (PF) 2 MG/ML IV SOLN: 2.0000 mg | INTRAVENOUS | Status: DC | PRN

## 2019-03-03 MED ORDER — TAMSULOSIN HCL 0.4 MG PO CAPS
0.4000 mg | ORAL_CAPSULE | Freq: Every evening | ORAL | Status: DC
  Administered 2019-03-03 – 2019-03-04 (×2): 0.4 mg via ORAL
  Filled 2019-03-03 (×2): qty 1

## 2019-03-03 NOTE — H&P (View-Only) (Signed)
Referring Provider:  [facility]         Primary Care Physician:  [name], MD Primary Gastroenterologist: Silvano Rusk , MD      Reason for Consultation:   Abnormal liver tests / abdominal pain       ASSESSMENT /  PLAN    1. 85 yo male with intermittent upper abdominal pain over last few days. Liver tests markedly abnormal. Suspect biliary colic. Similar problems in 2017 when underwent ERCP with sphincterotomy and removal of bile duct sludge. Already on schedule for ERCP in am with Dr. Carlean Purl  2. Fever at home. Diarrhea this am. Recently treated and responded to course of Vanco for presumed C-diff. He had C-diff in Sept 2019. Current diarrhea possibly related to Cipro started a couple of days ago. Recurrent C-diff possibly as are other etiologies. He has IBS.  -agree with checking for C-diff  -[facility] covering with Rocephin   3. Hypokalemia, K+ 2.8, likely from diarrhea. Takes HCTZ.  Hospitalist repleting and rechecking K+ at 5am  4. AKI, mild. Should improve with IVF.   5. HTN. BP med management per [facility].    HPI:    [name] [name] is a 85 y.o. male with PMH of HTN, CAD, IBS, and  Cdiff in Sept 2019. He is s/p remote cholecystectomy. He underwent ERCP with sphincterotomy and removal of sludge from bile duct in 2017 after presenting to our office with abdominal pain and abnormal liver tests.   Patient had Telehealth visit early June to evaluate diarrhea. Treated empirically with Vancomycin for presumed recurrent C-diff. Had good response to treatment.   Patient called office a few days ago with intermittent episodes of severe mid / upper abdominal pain reminiscent of gallbladder problems.  Labs obtained, liver tests markedly abnormal. Initial plan was for [place] but this was cancelled with plans to proceed with ERCP tomorrow. Started empirically on Cipro and so far has had two doses. Pre-op COVID done and negative. He has no abdominal pain now but has continued to have episodic,  non-radiating upper abdominal pain. Episodes last up to 20 minutes, resolve spontaneously. He feels okay in between episodes. Patient caledl the office again today - felt lightheaded, had recurrent diarrhea and fever. Advised ED evaluation. He describes 5 episodes of unformed to loose BMs but small volume and not reminiscent of C-diff.    ED EVALUATION   Afebrile.  Bradycardic. HR upper 40's low 50's.  Alk phos 446 , Tbili 1.9, AST 161, ALT 183. Numbers stable compared to 7/21 labs except ALT doubled.   K+ 2.8  Cr 1.34 , baseline 0.95 , lipase 27  WBC normal.  Platelets 146 (a little lower than baseline)  Past Medical History:  Diagnosis Date  . Arthritis   . BPH (benign prostatic hyperplasia)   . C. difficile colitis 04/20/2018  . CAD (coronary artery disease)    s/p CABG x 1  . DJD (degenerative joint disease)   . Gallstones   . HTN (hypertension)   . Hyperlipidemia   . Lumbar disc disease   . MVP (mitral valve prolapse)    s/p Mitral Valve Repair  . Pancreatitis   . 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 CHOLECYSTECTOMY     Dr. Lennie Hummer  . COLONOSCOPY  ~2008   no polyps per pt.  Dr. [name]  .  CORONARY ARTERY BYPASS GRAFT  January 2002   Single bypass to the distal RCA  . ERCP N/A 02/22/2013   Procedure: ENDOSCOPIC RETROGRADE CHOLANGIOPANCREATOGRAPHY (ERCP);  Surgeon: [name], MD;  Location: [facility];  Service: Endoscopy;  Laterality: N/A;  . ERCP N/A 09/21/2015   Procedure: ENDOSCOPIC RETROGRADE CHOLANGIOPANCREATOGRAPHY (ERCP);  Surgeon: [name], MD;  Location: [facility] ENDOSCOPY;  Service: Endoscopy;  Laterality: N/A;  with spyglass  . HERNIA REPAIR  05/05/11   Lap L inguinal & obturator herniae, R Femoral Hernia  . MITRAL VALVE REPAIR  January 2002  . SPYGLASS CHOLANGIOSCOPY N/A 02/22/2013   Procedure: SWNIOEVO  CHOLANGIOSCOPY;  Surgeon: [name], MD;  Location: [facility]OSCOPY;  Service: Endoscopy;  Laterality: N/A;  . SPYGLASS CHOLANGIOSCOPY N/A 09/21/2015   Procedure: JJKKXFGH CHOLANGIOSCOPY;  Surgeon: [name], MD;  Location: [place];  Service: Endoscopy;  Laterality: N/A;  Bess Kinds LITHOTRIPSY N/A 02/22/2013   Procedure: WEXHBZJI LITHOTRIPSY;  Surgeon: [name], MD;  Location: [facility];  Service: Endoscopy;  Laterality: N/A;  litho ord'd 7/8 by DL/PO [id number] [facility]  . UPPER GASTROINTESTINAL ENDOSCOPY  02/22/13    Prior to Admission medications   Medication Sig Start Date End Date Taking? Authorizing Provider  acetaminophen (TYLENOL) 500 MG tablet Take 500 mg by mouth every 6 (six) hours as needed for moderate pain.    [provider]  amLODipine (NORVASC) 5 MG tablet take 1 tablet by mouth once daily every evening Patient taking differently: Take 5 mg by mouth every evening. take 1 tablet by mouth once daily every evenin 11/19/18   Martinique, Peter M, MD  aspirin 81 MG tablet Take 81 mg by mouth daily with supper.     [provider]  b complex vitamins tablet Take 1 tablet by mouth daily.    [provider]  Cholecalciferol (VITAMIN D) 1000 UNITS capsule Take 1,000 Units by mouth daily.      [provider]  ciprofloxacin (CIPRO) 500 MG tablet Take 1 tablet (500 mg total) by mouth 2 (two) times daily. 03/01/19   [name], MD  dutasteride (AVODART) 0.5 MG capsule Take 0.5 mg by mouth every evening.     [provider]  famotidine (PEPCID) 20 MG tablet Take 20 mg by mouth daily as needed for heartburn or indigestion.    [provider]  gabapentin (NEURONTIN) 300 MG capsule Take 300 mg by mouth 4 (four) times daily.    [provider]  GLUCOSAMINE-CHONDROITIN PO Take 1 tablet by mouth 2 (two) times a day.     [provider]  HYDROcodone-acetaminophen (NORCO/VICODIN) 5-325 MG tablet Take 1 tablet by  mouth every 6 (six) hours as needed for moderate pain.  05/15/16   [provider]  MYRBETRIQ 50 MG TB24 tablet Take 1 tablet by mouth daily with supper.  09/03/16   [provider]  Polyethyl Glycol-Propyl Glycol (SYSTANE OP) Place 1 drop into both eyes daily as needed (dry eyes).    [provider]  rosuvastatin (CRESTOR) 20 MG tablet Take 1 tablet (20 mg total) by mouth daily. Patient taking differently: Take 20 mg by mouth daily with supper.  01/24/19   [name], MD  Simethicone (GAS-X PO) Take 1 tablet by mouth daily as needed (gas).     [provider]  Tamsulosin HCl (FLOMAX) 0.4 MG CAPS Take 0.4 mg by mouth every evening.     [provider]  valsartan-hydrochlorothiazide (DIOVAN-HCT) 320-12.5  MG tablet Take 1 tablet by mouth every morning. 01/25/19   Martinique, Peter M, MD  Wheat Dextrin (BENEFIBER PO) Take 1 Dose by mouth daily.    [provider]    Current Facility-Administered Medications  Medication Dose Route Frequency Provider Last Rate Last Dose  . potassium chloride 10 mEq in 100 mL IVPB  10 mEq Intravenous Q1 Hr x 3 Schlossman, Erin, MD      . potassium chloride SA (K-DUR) CR tablet 40 mEq  40 mEq Oral Once Gareth Morgan, MD      . sodium chloride 0.9 % bolus 1,000 mL  1,000 mL Intravenous Once Gareth Morgan, MD       Current Outpatient Medications  Medication Sig Dispense Refill  . acetaminophen (TYLENOL) 500 MG tablet Take 500 mg by mouth every 6 (six) hours as needed for moderate pain.    Marland Kitchen amLODipine (NORVASC) 5 MG tablet take 1 tablet by mouth once daily every evening (Patient taking differently: Take 5 mg by mouth every evening. take 1 tablet by mouth once daily every evenin) 90 tablet 1  . aspirin 81 MG tablet Take 81 mg by mouth daily with supper.     Marland Kitchen b complex vitamins tablet Take 1 tablet by mouth daily.    . Cholecalciferol (VITAMIN D) 1000 UNITS capsule Take 1,000 Units by mouth daily.      .  ciprofloxacin (CIPRO) 500 MG tablet Take 1 tablet (500 mg total) by mouth 2 (two) times daily. 14 tablet 0  . dutasteride (AVODART) 0.5 MG capsule Take 0.5 mg by mouth every evening.     . famotidine (PEPCID) 20 MG tablet Take 20 mg by mouth daily as needed for heartburn or indigestion.    . gabapentin (NEURONTIN) 300 MG capsule Take 300 mg by mouth 4 (four) times daily.    Marland Kitchen GLUCOSAMINE-CHONDROITIN PO Take 1 tablet by mouth 2 (two) times a day.     Marland Kitchen HYDROcodone-acetaminophen (NORCO/VICODIN) 5-325 MG tablet Take 1 tablet by mouth every 6 (six) hours as needed for moderate pain.     Marland Kitchen MYRBETRIQ 50 MG TB24 tablet Take 1 tablet by mouth daily with supper.     [name] Glycol-Propyl Glycol (SYSTANE OP) Place 1 drop into both eyes daily as needed (dry eyes).    . rosuvastatin (CRESTOR) 20 MG tablet Take 1 tablet (20 mg total) by mouth daily. (Patient taking differently: Take 20 mg by mouth daily with supper. ) 90 tablet 0  . Simethicone (GAS-X PO) Take 1 tablet by mouth daily as needed (gas).     . Tamsulosin HCl (FLOMAX) 0.4 MG CAPS Take 0.4 mg by mouth every evening.     . valsartan-hydrochlorothiazide (DIOVAN-HCT) 320-12.5 MG tablet Take 1 tablet by mouth every morning. 90 tablet 2  . Wheat Dextrin (BENEFIBER PO) Take 1 Dose by mouth daily.      Allergies as of 03/03/2019  . (No Known Allergies)    Family History  Problem Relation Age of Onset  . Stroke Father   . Stroke Mother        TIA  . Esophageal cancer Neg Hx     Social History   Socioeconomic History  . Marital status: Married    Spouse name: Not on file  . Number of children: 2  . Years of education: Not on file  . Highest education level: Not on file  Occupational History  . Occupation: Retired    Comment: Passenger transport manager:  RETIRED  Social Needs  . Financial resource strain: Not on file  . Food insecurity    Worry: Not on file    Inability: Not on file  . Transportation needs    Medical: Not on file     Non-medical: Not on file  Tobacco Use  . Smoking status: Never Smoker  . Smokeless tobacco: Never Used  Substance and Sexual Activity  . Alcohol use: Yes    Alcohol/week: 1.0 - 2.0 standard drinks    Types: 1 - 2 Glasses of wine per week    Comment: Ocassionally  . Drug use: No  . Sexual activity: Not on file  Lifestyle  . Physical activity    Days per week: Not on file    Minutes per session: Not on file  . Stress: Not on file  Relationships  . Social Herbalist on phone: Not on file    Gets together: Not on file    Attends religious service: Not on file    Active member of club or organization: Not on file    Attends meetings of clubs or organizations: Not on file    Relationship status: Not on file  . Intimate partner violence    Fear of current or ex partner: Not on file    Emotionally abused: Not on file    Physically abused: Not on file    Forced sexual activity: Not on file  Other Topics Concern  . Not on file  Social History Narrative   Married, retired from [employer], lives at [place]   Occasional alcohol not a smoker no drug use   2 children    Review of Systems: All systems reviewed and negative except where noted in HPI.  Physical Exam: Vital signs in last 24 hours: Temp:  [98.2 F (36.8 C)] 98.2 F (36.8 C) (07/23 1351) Pulse Rate:  [42-55] 49 (07/23 1515) Resp:  [14-25] 16 (07/23 1515) BP: (120-149)/(74-93) 135/77 (07/23 1515) SpO2:  [96 %-100 %] 96 % (07/23 1515) Weight:  [79.4 kg] 79.4 kg (07/23 1349)   General:   Alert, well-developed,  male in NAD Psych:  Pleasant, cooperative. Normal mood and affect. Eyes:  Pupils equal, sclera clear, no icterus.   Conjunctiva pink. Ears:  Normal auditory acuity. Nose:  No deformity, discharge,  or lesions. Neck:  Supple; no masses Lungs:  Clear throughout to auscultation.   No wheezes, crackles, or rhonchi.  Heart:  Regular rate and rhythm, no lower extremity edema Abdomen:  Soft,  non-distended, nontender, BS active, no palp mass Rectal:  Deferred  Msk:  Symmetrical without gross deformities. . Neurologic:  Alert and  oriented x4;  grossly normal neurologically. Skin:  Intact without significant lesions or rashes.   Intake/Output from previous day: No intake/output data recorded. Intake/Output this shift: No intake/output data recorded.  Lab Results: Recent Labs    03/03/19 1417  WBC 6.8  HGB 12.5*  HCT 38.5*  PLT 146*   BMET Recent Labs    03/03/19 1417  NA 137  K 2.8*  CL 99  CO2 27  GLUCOSE 115*  BUN 19  CREATININE 1.34*  CALCIUM 8.1*   LFT Recent Labs    03/01/19 1112 03/03/19 1417  PROT 7.0 6.6  ALBUMIN 4.0 3.3*  AST 197* 161*  ALT 96* 183*  ALKPHOS 403* 446*  BILITOT 2.5* 1.9*  BILIDIR 1.2*  --      . CBC Latest Ref Rng & Units 03/03/2019 09/04/2017 09/10/2016  WBC 4.0 - 10.5 K/uL 6.8 7.4 7.0  Hemoglobin 13.0 - 17.0 g/dL 12.5(L) 13.4 13.3  Hematocrit 39.0 - 52.0 % 38.5(L) 39.0 39.6  Platelets 150 - 400 K/uL 146(L) 220 254    . CMP Latest Ref Rng & Units 03/03/2019 03/01/2019 09/04/2017  Glucose 70 - 99 mg/dL 115(H) - 96  BUN 8 - 23 mg/dL 19 - 18  Creatinine 0.61 - 1.24 mg/dL 1.34(H) - 0.95  Sodium 135 - 145 mmol/L 137 - 142  Potassium 3.5 - 5.1 mmol/L 2.8(L) - 3.9  Chloride 98 - 111 mmol/L 99 - 101  CO2 22 - 32 mmol/L 27 - 27  Calcium 8.9 - 10.3 mg/dL 8.1(L) - 8.9  Total Protein 6.5 - 8.1 g/dL 6.6 7.0 6.7  Total Bilirubin 0.3 - 1.2 mg/dL 1.9(H) 2.5(H) 0.8  Alkaline Phos 38 - 126 U/L 446(H) 403(H) 88  AST 15 - 41 U/L 161(H) 197(H) 20  ALT 0 - 44 U/L 183(H) 96(H) 9   Studies/Results: No results found.   Tye Savoy, NP-C @  03/03/2019, 3:59 PM   Attending physician's note   I have taken a history, examined the patient and reviewed the chart. I agree with the Advanced Practitioner's note, impression and recommendations.  89 yr M with h/o biliary sludge with abdominal pain and fever concerning for cholangitis Alk  phos elevated to 446 suggestive of biliary obstruction No leucocytosis. Hemodynamically stable On Ceftriaxone Plan for ERCP tomorrow AM by Dr Carlean Purl  Recurrent diarrhea, recently treated for C.diff Restarted oral Vancomycin. F/u stool C.diff    Damaris Hippo , MD [phone]

## 2019-03-03 NOTE — Telephone Encounter (Signed)
Patient called said that he is not feeling well he is scheduled for tomorrow at [facility]. He would like to still have his appt but he said he feels Lightheaded, diarrhea and had a fever of over 100 degrees last night

## 2019-03-03 NOTE — Telephone Encounter (Signed)
Dr. Carlean Purl okay to proceed with ERCP tomorrow?

## 2019-03-03 NOTE — Consult Note (Addendum)
Referring Provider:  [facility]         Primary Care Physician:  [name], MD Primary Gastroenterologist: Silvano Rusk , MD      Reason for Consultation:   Abnormal liver tests / abdominal pain       ASSESSMENT /  PLAN    1. 85 yo male with intermittent upper abdominal pain over last few days. Liver tests markedly abnormal. Suspect biliary colic. Similar problems in 2017 when underwent ERCP with sphincterotomy and removal of bile duct sludge. Already on schedule for ERCP in am with Dr. Carlean Purl  2. Fever at home. Diarrhea this am. Recently treated and responded to course of Vanco for presumed C-diff. He had C-diff in Sept 2019. Current diarrhea possibly related to Cipro started a couple of days ago. Recurrent C-diff possibly as are other etiologies. He has IBS.  -agree with checking for C-diff  -TRH covering with Rocephin   3. Hypokalemia, K+ 2.8, likely from diarrhea. Takes HCTZ.  Hospitalist repleting and rechecking K+ at 5am  4. AKI, mild. Should improve with IVF.   5. HTN. BP med management per [facility].    HPI:    [name] [name] is a 85 y.o. male with PMH of HTN, CAD, IBS, and  Cdiff in Sept 2019. He is s/p remote cholecystectomy. He underwent ERCP with sphincterotomy and removal of sludge from bile duct in 2017 after presenting to our office with abdominal pain and abnormal liver tests.   Patient had Telehealth visit early June to evaluate diarrhea. Treated empirically with Vancomycin for presumed recurrent C-diff. Had good response to treatment.   Patient called office a few days ago with intermittent episodes of severe mid / upper abdominal pain reminiscent of gallbladder problems.  Labs obtained, liver tests markedly abnormal. Initial plan was for [place] but this was cancelled with plans to proceed with ERCP tomorrow. Started empirically on Cipro and so far has had two doses. Pre-op COVID done and negative. He has no abdominal pain now but has continued to have episodic,  non-radiating upper abdominal pain. Episodes last up to 20 minutes, resolve spontaneously. He feels okay in between episodes. Patient caledl the office again today - felt lightheaded, had recurrent diarrhea and fever. Advised ED evaluation. He describes 5 episodes of unformed to loose BMs but small volume and not reminiscent of C-diff.    ED EVALUATION   Afebrile.  Bradycardic. HR upper 40's low 50's.  Alk phos 446 , Tbili 1.9, AST 161, ALT 183. Numbers stable compared to 7/21 labs except ALT doubled.   K+ 2.8  Cr 1.34 , baseline 0.95 , lipase 27  WBC normal.  Platelets 146 (a little lower than baseline)  Past Medical History:  Diagnosis Date  . Arthritis   . BPH (benign prostatic hyperplasia)   . C. difficile colitis 04/20/2018  . CAD (coronary artery disease)    s/p CABG x 1  . DJD (degenerative joint disease)   . Gallstones   . HTN (hypertension)   . Hyperlipidemia   . Lumbar disc disease   . MVP (mitral valve prolapse)    s/p Mitral Valve Repair  . Pancreatitis   . 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 CHOLECYSTECTOMY     Dr. Lennie Hummer  . COLONOSCOPY  ~2008   no polyps per pt.  Dr. [name]  .  CORONARY ARTERY BYPASS GRAFT  January 2002   Single bypass to the distal RCA  . ERCP N/A 02/22/2013   Procedure: ENDOSCOPIC RETROGRADE CHOLANGIOPANCREATOGRAPHY (ERCP);  Surgeon: [name], MD;  Location: [facility];  Service: Endoscopy;  Laterality: N/A;  . ERCP N/A 09/21/2015   Procedure: ENDOSCOPIC RETROGRADE CHOLANGIOPANCREATOGRAPHY (ERCP);  Surgeon: [name], MD;  Location: [facility] ENDOSCOPY;  Service: Endoscopy;  Laterality: N/A;  with spyglass  . HERNIA REPAIR  05/05/11   Lap L inguinal & obturator herniae, R Femoral Hernia  . MITRAL VALVE REPAIR  January 2002  . SPYGLASS CHOLANGIOSCOPY N/A 02/22/2013   Procedure: [id number]  CHOLANGIOSCOPY;  Surgeon: [name], MD;  Location: [facility] ENDOSCOPY;  Service: Endoscopy;  Laterality: N/A;  . SPYGLASS CHOLANGIOSCOPY N/A 09/21/2015   Procedure: UXNATFTD CHOLANGIOSCOPY;  Surgeon: [name], MD;  Location: [place];  Service: Endoscopy;  Laterality: N/A;  Bess Kinds LITHOTRIPSY N/A 02/22/2013   Procedure: DUKGURKY LITHOTRIPSY;  Surgeon: [name], MD;  Location: [facility];  Service: Endoscopy;  Laterality: N/A;  litho ord'd 7/8 by DL/PO [phone] [facility]  . UPPER GASTROINTESTINAL ENDOSCOPY  02/22/13    Prior to Admission medications   Medication Sig Start Date End Date Taking? Authorizing Provider  acetaminophen (TYLENOL) 500 MG tablet Take 500 mg by mouth every 6 (six) hours as needed for moderate pain.    [provider]  amLODipine (NORVASC) 5 MG tablet take 1 tablet by mouth once daily every evening Patient taking differently: Take 5 mg by mouth every evening. take 1 tablet by mouth once daily every evenin 11/19/18   Martinique, Peter M, MD  aspirin 81 MG tablet Take 81 mg by mouth daily with supper.     [provider]  b complex vitamins tablet Take 1 tablet by mouth daily.    [provider]  Cholecalciferol (VITAMIN D) 1000 UNITS capsule Take 1,000 Units by mouth daily.      [provider]  ciprofloxacin (CIPRO) 500 MG tablet Take 1 tablet (500 mg total) by mouth 2 (two) times daily. 03/01/19   [name], MD  dutasteride (AVODART) 0.5 MG capsule Take 0.5 mg by mouth every evening.     [provider]  famotidine (PEPCID) 20 MG tablet Take 20 mg by mouth daily as needed for heartburn or indigestion.    [provider]  gabapentin (NEURONTIN) 300 MG capsule Take 300 mg by mouth 4 (four) times daily.    [provider]  GLUCOSAMINE-CHONDROITIN PO Take 1 tablet by mouth 2 (two) times a day.     [provider]  HYDROcodone-acetaminophen (NORCO/VICODIN) 5-325 MG tablet Take 1 tablet by  mouth every 6 (six) hours as needed for moderate pain.  05/15/16   [provider]  MYRBETRIQ 50 MG TB24 tablet Take 1 tablet by mouth daily with supper.  09/03/16   [provider]  Polyethyl Glycol-Propyl Glycol (SYSTANE OP) Place 1 drop into both eyes daily as needed (dry eyes).    [provider]  rosuvastatin (CRESTOR) 20 MG tablet Take 1 tablet (20 mg total) by mouth daily. Patient taking differently: Take 20 mg by mouth daily with supper.  01/24/19   [name], MD  Simethicone (GAS-X PO) Take 1 tablet by mouth daily as needed (gas).     [provider]  Tamsulosin HCl (FLOMAX) 0.4 MG CAPS Take 0.4 mg by mouth every evening.     [provider]  valsartan-hydrochlorothiazide (DIOVAN-HCT) 320-12.5  MG tablet Take 1 tablet by mouth every morning. 01/25/19   Martinique, Peter M, MD  Wheat Dextrin (BENEFIBER PO) Take 1 Dose by mouth daily.    [provider]    Current Facility-Administered Medications  Medication Dose Route Frequency Provider Last Rate Last Dose  . potassium chloride 10 mEq in 100 mL IVPB  10 mEq Intravenous Q1 Hr x 3 Schlossman, Erin, MD      . potassium chloride SA (K-DUR) CR tablet 40 mEq  40 mEq Oral Once Gareth Morgan, MD      . sodium chloride 0.9 % bolus 1,000 mL  1,000 mL Intravenous Once Gareth Morgan, MD       Current Outpatient Medications  Medication Sig Dispense Refill  . acetaminophen (TYLENOL) 500 MG tablet Take 500 mg by mouth every 6 (six) hours as needed for moderate pain.    Marland Kitchen amLODipine (NORVASC) 5 MG tablet take 1 tablet by mouth once daily every evening (Patient taking differently: Take 5 mg by mouth every evening. take 1 tablet by mouth once daily every evenin) 90 tablet 1  . aspirin 81 MG tablet Take 81 mg by mouth daily with supper.     Marland Kitchen b complex vitamins tablet Take 1 tablet by mouth daily.    . Cholecalciferol (VITAMIN D) 1000 UNITS capsule Take 1,000 Units by mouth daily.      .  ciprofloxacin (CIPRO) 500 MG tablet Take 1 tablet (500 mg total) by mouth 2 (two) times daily. 14 tablet 0  . dutasteride (AVODART) 0.5 MG capsule Take 0.5 mg by mouth every evening.     . famotidine (PEPCID) 20 MG tablet Take 20 mg by mouth daily as needed for heartburn or indigestion.    . gabapentin (NEURONTIN) 300 MG capsule Take 300 mg by mouth 4 (four) times daily.    Marland Kitchen GLUCOSAMINE-CHONDROITIN PO Take 1 tablet by mouth 2 (two) times a day.     Marland Kitchen HYDROcodone-acetaminophen (NORCO/VICODIN) 5-325 MG tablet Take 1 tablet by mouth every 6 (six) hours as needed for moderate pain.     Marland Kitchen MYRBETRIQ 50 MG TB24 tablet Take 1 tablet by mouth daily with supper.     [name] Glycol-Propyl Glycol (SYSTANE OP) Place 1 drop into both eyes daily as needed (dry eyes).    . rosuvastatin (CRESTOR) 20 MG tablet Take 1 tablet (20 mg total) by mouth daily. (Patient taking differently: Take 20 mg by mouth daily with supper. ) 90 tablet 0  . Simethicone (GAS-X PO) Take 1 tablet by mouth daily as needed (gas).     . Tamsulosin HCl (FLOMAX) 0.4 MG CAPS Take 0.4 mg by mouth every evening.     . valsartan-hydrochlorothiazide (DIOVAN-HCT) 320-12.5 MG tablet Take 1 tablet by mouth every morning. 90 tablet 2  . Wheat Dextrin (BENEFIBER PO) Take 1 Dose by mouth daily.      Allergies as of 03/03/2019  . (No Known Allergies)    Family History  Problem Relation Age of Onset  . Stroke Father   . Stroke Mother        TIA  . Esophageal cancer Neg Hx     Social History   Socioeconomic History  . Marital status: Married    Spouse name: Not on file  . Number of children: 2  . Years of education: Not on file  . Highest education level: Not on file  Occupational History  . Occupation: Retired    Comment: Passenger transport manager:  RETIRED  Social Needs  . Financial resource strain: Not on file  . Food insecurity    Worry: Not on file    Inability: Not on file  . Transportation needs    Medical: Not on file     Non-medical: Not on file  Tobacco Use  . Smoking status: Never Smoker  . Smokeless tobacco: Never Used  Substance and Sexual Activity  . Alcohol use: Yes    Alcohol/week: 1.0 - 2.0 standard drinks    Types: 1 - 2 Glasses of wine per week    Comment: Ocassionally  . Drug use: No  . Sexual activity: Not on file  Lifestyle  . Physical activity    Days per week: Not on file    Minutes per session: Not on file  . Stress: Not on file  Relationships  . Social Herbalist on phone: Not on file    Gets together: Not on file    Attends religious service: Not on file    Active member of club or organization: Not on file    Attends meetings of clubs or organizations: Not on file    Relationship status: Not on file  . Intimate partner violence    Fear of current or ex partner: Not on file    Emotionally abused: Not on file    Physically abused: Not on file    Forced sexual activity: Not on file  Other Topics Concern  . Not on file  Social History Narrative   Married, retired from [employer], lives at [place]   Occasional alcohol not a smoker no drug use   2 children    Review of Systems: All systems reviewed and negative except where noted in HPI.  Physical Exam: Vital signs in last 24 hours: Temp:  [98.2 F (36.8 C)] 98.2 F (36.8 C) (07/23 1351) Pulse Rate:  [42-55] 49 (07/23 1515) Resp:  [14-25] 16 (07/23 1515) BP: (120-149)/(74-93) 135/77 (07/23 1515) SpO2:  [96 %-100 %] 96 % (07/23 1515) Weight:  [79.4 kg] 79.4 kg (07/23 1349)   General:   Alert, well-developed,  male in NAD Psych:  Pleasant, cooperative. Normal mood and affect. Eyes:  Pupils equal, sclera clear, no icterus.   Conjunctiva pink. Ears:  Normal auditory acuity. Nose:  No deformity, discharge,  or lesions. Neck:  Supple; no masses Lungs:  Clear throughout to auscultation.   No wheezes, crackles, or rhonchi.  Heart:  Regular rate and rhythm, no lower extremity edema Abdomen:  Soft,  non-distended, nontender, BS active, no palp mass Rectal:  Deferred  Msk:  Symmetrical without gross deformities. . Neurologic:  Alert and  oriented x4;  grossly normal neurologically. Skin:  Intact without significant lesions or rashes.   Intake/Output from previous day: No intake/output data recorded. Intake/Output this shift: No intake/output data recorded.  Lab Results: Recent Labs    03/03/19 1417  WBC 6.8  HGB 12.5*  HCT 38.5*  PLT 146*   BMET Recent Labs    03/03/19 1417  NA 137  K 2.8*  CL 99  CO2 27  GLUCOSE 115*  BUN 19  CREATININE 1.34*  CALCIUM 8.1*   LFT Recent Labs    03/01/19 1112 03/03/19 1417  PROT 7.0 6.6  ALBUMIN 4.0 3.3*  AST 197* 161*  ALT 96* 183*  ALKPHOS 403* 446*  BILITOT 2.5* 1.9*  BILIDIR 1.2*  --      . CBC Latest Ref Rng & Units 03/03/2019 09/04/2017 09/10/2016  WBC 4.0 - 10.5 K/uL 6.8 7.4 7.0  Hemoglobin 13.0 - 17.0 g/dL 12.5(L) 13.4 13.3  Hematocrit 39.0 - 52.0 % 38.5(L) 39.0 39.6  Platelets 150 - 400 K/uL 146(L) 220 254    . CMP Latest Ref Rng & Units 03/03/2019 03/01/2019 09/04/2017  Glucose 70 - 99 mg/dL 115(H) - 96  BUN 8 - 23 mg/dL 19 - 18  Creatinine 0.61 - 1.24 mg/dL 1.34(H) - 0.95  Sodium 135 - 145 mmol/L 137 - 142  Potassium 3.5 - 5.1 mmol/L 2.8(L) - 3.9  Chloride 98 - 111 mmol/L 99 - 101  CO2 22 - 32 mmol/L 27 - 27  Calcium 8.9 - 10.3 mg/dL 8.1(L) - 8.9  Total Protein 6.5 - 8.1 g/dL 6.6 7.0 6.7  Total Bilirubin 0.3 - 1.2 mg/dL 1.9(H) 2.5(H) 0.8  Alkaline Phos 38 - 126 U/L 446(H) 403(H) 88  AST 15 - 41 U/L 161(H) 197(H) 20  ALT 0 - 44 U/L 183(H) 96(H) 9   Studies/Results: No results found.   Tye Savoy, NP-C @  03/03/2019, 3:59 PM   Attending physician's note   I have taken a history, examined the patient and reviewed the chart. I agree with the Advanced Practitioner's note, impression and recommendations.  6 yr M with h/o biliary sludge with abdominal pain and fever concerning for cholangitis Alk  phos elevated to 446 suggestive of biliary obstruction No leucocytosis. Hemodynamically stable On Ceftriaxone Plan for ERCP tomorrow AM by Dr Carlean Purl  Recurrent diarrhea, recently treated for C.diff Restarted oral Vancomycin. F/u stool C.diff    Damaris Hippo , MD [phone]

## 2019-03-03 NOTE — Telephone Encounter (Signed)
Patient and his wife notified of the recommendations Tye Savoy RNP notified of the plans

## 2019-03-03 NOTE — Telephone Encounter (Signed)
I think he needs it  It sounds like he may need admission - I suggest he come to ED at [facility] and be evaluated - that way we could tune him up and get the ERCP done  We know his Coronavirius is negative  Could have some Abx side effects but I am concerned fever is from biliary obstruction

## 2019-03-03 NOTE — ED Triage Notes (Signed)
Pt states lightheadedness x 1 week. Pt states that he has also had gallstones for the same amount of time. Pt states he has had a fever of 101 at home. Pt had hydrocodone this morning for gallstone pain. Pt states he also tried lightheadedness medication as well  Pt lives on indenpendent side of [facility].

## 2019-03-03 NOTE — ED Notes (Signed)
ED TO INPATIENT HANDOFF REPORT  Name/Age/Gender [name] 85 y.o. male  Code Status Code Status History    Date Active Date Inactive Code Status Order ID Comments User Context   09/19/2015 1757 09/22/2015 1541 Full Code [id number]  Debbe Odea, MD Inpatient   Advance Care Planning Activity    Advance Directive Documentation     Most Recent Value  Type of Advance Directive  Healthcare Power of Attorney, Living will  Pre-existing out of facility DNR order (yellow form or pink MOST form)  -  "MOST" Form in Place?  -      Home/SNF/Other Home  Chief Complaint fever   Level of Care/Admitting Diagnosis ED Disposition    ED Disposition Condition [place]  [place] [100102]  Level of Care: Med-Surg [16]  Covid Evaluation: Confirmed COVID Negative  Diagnosis: Cholangitis due to bile duct calculus with obstruction [[id number]]  Admitting Physician: Barb Merino [[id number]]  Attending Physician: Barb Merino [[id number]]  Estimated length of stay: past midnight tomorrow  Certification:: I certify this patient will need inpatient services for at least 2 midnights  PT Class (Do Not Modify): Inpatient [101]  PT Acc Code (Do Not Modify): Private [1]       Medical History Past Medical History:  Diagnosis Date  . Arthritis   . BPH (benign prostatic hyperplasia)   . C. difficile colitis 04/20/2018  . CAD (coronary artery disease)    s/p CABG x 1  . DJD (degenerative joint disease)   . Gallstones   . HTN (hypertension)   . Hyperlipidemia   . Lumbar disc disease   . MVP (mitral valve prolapse)    s/p Mitral Valve Repair  . Pancreatitis   . Wandering (atrial) pacemaker 11/22/2013   with frequent multifocal ectopy (PACs, PVCs, junctional escape beats    Allergies No Known Allergies  IV Location/Drains/Wounds Patient Lines/Drains/Airways Status   Active Line/Drains/Airways    Name:   Placement date:   Placement time:   Site:    Days:   Peripheral IV 03/03/19 Right Antecubital   03/03/19    1639    Antecubital   less than 1   Peripheral IV 03/03/19 Right;Anterior Forearm   03/03/19    1652    Forearm   less than 1   Urethral Catheter 16 Fr.   02/23/13    0800    -   2199   External Urinary Catheter   02/23/13    0217    -   2199          Labs/Imaging Results for orders placed or performed during the hospital encounter of 03/03/19 (from the past 48 hour(s))  CBC with Differential     Status: Abnormal   Collection Time: 03/03/19  2:17 PM  Result Value Ref Range   WBC 6.8 4.0 - 10.5 K/uL   RBC 4.17 (L) 4.22 - 5.81 MIL/uL   Hemoglobin 12.5 (L) 13.0 - 17.0 g/dL   HCT 38.5 (L) 39.0 - 52.0 %   MCV 92.3 80.0 - 100.0 fL   MCH 30.0 26.0 - 34.0 pg   MCHC 32.5 30.0 - 36.0 g/dL   RDW 13.6 11.5 - 15.5 %   Platelets 146 (L) 150 - 400 K/uL   nRBC 0.0 0.0 - 0.2 %   Neutrophils Relative % 87 %   Neutro Abs 5.8 1.7 - 7.7 K/uL   Lymphocytes Relative 6 %   Lymphs Abs 0.4 (L) 0.7 -  4.0 K/uL   Monocytes Relative 7 %   Monocytes Absolute 0.5 0.1 - 1.0 K/uL   Eosinophils Relative 0 %   Eosinophils Absolute 0.0 0.0 - 0.5 K/uL   Basophils Relative 0 %   Basophils Absolute 0.0 0.0 - 0.1 K/uL   Immature Granulocytes 0 %   Abs Immature Granulocytes 0.02 0.00 - 0.07 K/uL    Comment: Performed at [facility], [place] [address] [place], [place] [address]  Comprehensive metabolic panel     Status: Abnormal   Collection Time: 03/03/19  2:17 PM  Result Value Ref Range   Sodium 137 135 - 145 mmol/L   Potassium 2.8 (L) 3.5 - 5.1 mmol/L   Chloride 99 98 - 111 mmol/L   CO2 27 22 - 32 mmol/L   Glucose, Bld 115 (H) 70 - 99 mg/dL   BUN 19 8 - 23 mg/dL   Creatinine, Ser 1.34 (H) 0.61 - 1.24 mg/dL   Calcium 8.1 (L) 8.9 - 10.3 mg/dL   Total Protein 6.6 6.5 - 8.1 g/dL   Albumin 3.3 (L) 3.5 - 5.0 g/dL   AST 161 (H) 15 - 41 U/L   ALT 183 (H) 0 - 44 U/L   Alkaline Phosphatase 446 (H) 38 - 126 U/L   Total Bilirubin 1.9  (H) 0.3 - 1.2 mg/dL   GFR calc non Af Amer 48 (L) >60 mL/min   GFR calc Af Amer 55 (L) >60 mL/min   Anion gap 11 5 - 15    Comment: Performed at [facility], [place] [address] [place], [place] [address]  Lipase, blood     Status: None   Collection Time: 03/03/19  2:17 PM  Result Value Ref Range   Lipase 27 11 - 51 U/L    Comment: Performed at [facility], [place] [address], [place], [place] [address]  Urinalysis, Routine w reflex microscopic     Status: Abnormal   Collection Time: 03/03/19  3:19 PM  Result Value Ref Range   Color, Urine AMBER (A) YELLOW    Comment: BIOCHEMICALS MAY BE AFFECTED BY COLOR   APPearance HAZY (A) CLEAR   Specific Gravity, Urine 1.021 1.005 - 1.030   pH 5.0 5.0 - 8.0   Glucose, UA NEGATIVE NEGATIVE mg/dL   Hgb urine dipstick NEGATIVE NEGATIVE   Bilirubin Urine NEGATIVE NEGATIVE   Ketones, ur 5 (A) NEGATIVE mg/dL   Protein, ur 100 (A) NEGATIVE mg/dL   Nitrite NEGATIVE NEGATIVE   Leukocytes,Ua NEGATIVE NEGATIVE   RBC / HPF 0-5 0 - 5 RBC/hpf   WBC, UA 0-5 0 - 5 WBC/hpf   Bacteria, UA NONE SEEN NONE SEEN   Squamous Epithelial / LPF 0-5 0 - 5   Mucus PRESENT     Comment: Performed at [facility], [place] [address] [place], [place] [address]   No results found.  Pending Labs Unresulted Labs (From admission, onward)    Start     Ordered   03/04/19 0500  Comprehensive metabolic panel  Tomorrow morning,   R     03/03/19 1618   03/04/19 0500  CBC with Differential/Platelet  Tomorrow morning,   R     03/03/19 1618   03/04/19 0500  Magnesium  Tomorrow morning,   R     03/03/19 1618   03/04/19 0500  Phosphorus  Tomorrow morning,   R     03/03/19 1618   03/03/19 1619  C difficile quick scan w  PCR reflex  (C Difficile quick screen w PCR reflex panel)  Once, for 24 hours,   STAT     03/03/19 1618   03/03/19 1618  Culture, blood (routine x 2)  BLOOD CULTURE X 2,   R (with STAT occurrences)     03/03/19  1618   03/03/19 1550  SARS Coronavirus 2 (CEPHEID - Performed in [facility] hospital lab), Hosp Order  (Asymptomatic Patients Labs)  Once,   STAT    Question:  Rule Out  Answer:  Yes   03/03/19 1549   03/03/19 1547  Magnesium  ONCE - STAT,   STAT     03/03/19 1547   03/03/19 1403  Urine culture  ONCE - STAT,   STAT     03/03/19 1402          Vitals/Pain Today's Vitals   03/03/19 1500 03/03/19 1515 03/03/19 1545 03/03/19 1600  BP: (!) 149/74 135/77 121/77 (!) 141/79  Pulse: (!) 42 (!) 49 (!) 50 (!) 52  Resp: (!) 25 16 16 17   Temp:      TempSrc:      SpO2: 100% 96% 96% 96%  Weight:      Height:      PainSc:        Isolation Precautions Enteric precautions (UV disinfection)  Medications Medications  potassium chloride 10 mEq in 100 mL IVPB (10 mEq Intravenous New Bag/Given 03/03/19 1714)  cefTRIAXone (ROCEPHIN) 2 g in sodium chloride 0.9 % 100 mL IVPB (has no administration in time range)  vancomycin (VANCOCIN) 50 mg/mL oral solution 125 mg (has no administration in time range)  sodium chloride 0.9 % bolus 1,000 mL (1,000 mLs Intravenous New Bag/Given 03/03/19 1657)  potassium chloride SA (K-DUR) CR tablet 40 mEq (40 mEq Oral Given 03/03/19 1657)    Mobility walks

## 2019-03-03 NOTE — ED Provider Notes (Signed)
[facility] DEPT Provider Note   CSN: [id number] Arrival date & time: 03/03/19  1338    History   Chief Complaint Chief Complaint  Patient presents with  . Dizziness    HPI [name] [name] is a 85 y.o. male.     HPI  Presents with concern for lightheadedness for 4-5 days, blood pressures have been lower than usual for the last few days Has had severe "gallstone attacks" for the last few days. Hx of cholecystectomy, but has had choledocolithiasis and past ERCP Has had sharp pain that comes and goes, Monday this week lasted 20 minutes with severe pain, has had about 5 episodes since Monday, not related to eating, sharp epigastric pain.  Has not had n/v but tried to vomit to help with pain.  Had CDiff 1 month ago that resolved.  Today had 5 loose stool, small.    Lives at [facility] and nurse there came to see him yesterday and she said if she feels it coming on to take hydrocodone. Began doing it and helped it feel better.  Was going to do [place] but then ended up doing labwork and plan for ERCP tomorrow.    ERCP scheduled for 8AM tomorrow with Dr. Carlean Purl  Had fever earlier in the week up to 101, had not had it again for a few days. That day had chills.   No cough  No black or bloody stools No chest pain or shortness of breath No cough, no urinary symptoms   Past Medical History:  Diagnosis Date  . Arthritis   . BPH (benign prostatic hyperplasia)   . C. difficile colitis 04/20/2018  . CAD (coronary artery disease)    s/p CABG x 1  . DJD (degenerative joint disease)   . Gallstones   . HTN (hypertension)   . Hyperlipidemia   . Lumbar disc disease   . MVP (mitral valve prolapse)    s/p Mitral Valve Repair  . Pancreatitis   . Wandering (atrial) pacemaker 11/22/2013   with frequent multifocal ectopy (PACs, PVCs, junctional escape beats    Patient Active Problem List   Diagnosis Date Noted  . Cholangitis due to bile duct calculus with  obstruction 03/03/2019  . Hypokalemia due to excessive gastrointestinal loss of potassium 03/03/2019  . Indigestion 01/31/2019  . C. difficile colitis 01/18/2019  . IBS (irritable bowel syndrome) 01/18/2019  . Pain in right hip 01/01/2017  . Peripheral neuropathy 10/13/2014  . Spinal stenosis of lumbar region 10/13/2014  . Wandering (atrial) pacemaker 11/22/2013  . PAC (premature atrial contraction) 11/17/2012  . Arthritis 09/30/2011  . Left obturator hernia 05/21/2011  . Right femoral hernia 05/21/2011  . Left inguinal hernia 04/10/2011  . DOE (dyspnea on exertion) 11/06/2010  . PVC's (premature ventricular contractions) 11/06/2010  . S/P mitral valve repair 11/06/2010  . Fatigue 11/06/2010  . CAD (coronary artery disease)   . HTN (hypertension)   . Hyperlipidemia     Past Surgical History:  Procedure Laterality Date  . CHOLECYSTECTOMY     Dr. Lennie Hummer  . COLONOSCOPY  ~2008   no polyps per pt.  Dr. [name]  . CORONARY ARTERY BYPASS GRAFT  January 2002   Single bypass to the distal RCA  . ERCP N/A 02/22/2013   Procedure: ENDOSCOPIC RETROGRADE CHOLANGIOPANCREATOGRAPHY (ERCP);  Surgeon: [name], MD;  Location: [facility];  Service: Endoscopy;  Laterality: N/A;  . ERCP N/A 09/21/2015   Procedure: ENDOSCOPIC RETROGRADE CHOLANGIOPANCREATOGRAPHY (ERCP);  Surgeon: [name], MD;  Location: [facility] ENDOSCOPY;  Service: Endoscopy;  Laterality: N/A;  with spyglass  . HERNIA REPAIR  05/05/11   Lap L inguinal & obturator herniae, R Femoral Hernia  . MITRAL VALVE REPAIR  January 2002  . SPYGLASS CHOLANGIOSCOPY N/A 02/22/2013   Procedure: TDVVOHYW CHOLANGIOSCOPY;  Surgeon: [name], MD;  Location: [facility] ENDOSCOPY;  Service: Endoscopy;  Laterality: N/A;  . SPYGLASS CHOLANGIOSCOPY N/A 09/21/2015   Procedure: VPXTGGYI CHOLANGIOSCOPY;  Surgeon: [name], MD;  Location: [facility];  Service: Endoscopy;  Laterality: N/A;  Bess Kinds LITHOTRIPSY N/A 02/22/2013    Procedure: RSWNIOEV LITHOTRIPSY;  Surgeon: [name], MD;  Location: [facility] ENDOSCOPY;  Service: Endoscopy;  Laterality: N/A;  litho ord'd 7/8 by DL/PO [id number] [facility]  . UPPER GASTROINTESTINAL ENDOSCOPY  02/22/13        Home Medications    Prior to Admission medications   Medication Sig Start Date End Date Taking? Authorizing Provider  acetaminophen (TYLENOL) 500 MG tablet Take 500 mg by mouth every 6 (six) hours as needed for moderate pain.   Yes [provider]  amLODipine (NORVASC) 5 MG tablet take 1 tablet by mouth once daily every evening Patient taking differently: Take 5 mg by mouth every evening. take 1 tablet by mouth once daily every evenin 11/19/18  Yes Martinique, Peter M, MD  aspirin 81 MG tablet Take 81 mg by mouth daily with supper.    Yes [provider]  b complex vitamins tablet Take 1 tablet by mouth daily.   Yes [provider]  Cholecalciferol (VITAMIN D) 1000 UNITS capsule Take 1,000 Units by mouth daily.     Yes [provider]  ciprofloxacin (CIPRO) 500 MG tablet Take 1 tablet (500 mg total) by mouth 2 (two) times daily. 03/01/19  Yes [name], MD  dutasteride (AVODART) 0.5 MG capsule Take 0.5 mg by mouth every evening.    Yes [provider]  famotidine (PEPCID) 20 MG tablet Take 20 mg by mouth daily as needed for heartburn or indigestion.   Yes [provider]  GLUCOSAMINE-CHONDROITIN PO Take 1 tablet by mouth 2 (two) times a day.    Yes [provider]  HYDROcodone-acetaminophen (NORCO/VICODIN) 5-325 MG tablet Take 1 tablet by mouth every 6 (six) hours as needed for moderate pain.  05/15/16  Yes [provider]  MYRBETRIQ 50 MG TB24 tablet Take 1 tablet by mouth daily with supper.  09/03/16  Yes [provider]  Polyethyl Glycol-Propyl Glycol (SYSTANE OP) Place 1 drop into both eyes daily as needed (dry eyes).   Yes [provider]  rosuvastatin (CRESTOR) 20 MG tablet Take 1  tablet (20 mg total) by mouth daily. Patient taking differently: Take 20 mg by mouth daily with supper.  01/24/19  Yes [name], MD  Simethicone (GAS-X PO) Take 1 tablet by mouth daily as needed (gas).    Yes [provider]  Tamsulosin HCl (FLOMAX) 0.4 MG CAPS Take 0.4 mg by mouth every evening.    Yes [provider]  valsartan-hydrochlorothiazide (DIOVAN-HCT) 320-12.5 MG tablet Take 1 tablet by mouth every morning. 01/25/19  Yes Martinique, Peter M, MD  Wheat Dextrin (BENEFIBER PO) Take 1 Dose by mouth daily.   Yes [provider]  gabapentin (NEURONTIN) 300 MG capsule Take 300 mg by mouth 4 (four) times daily.    [provider]    Family History Family History  Problem Relation Age of Onset  .  Stroke Father   . Stroke Mother        TIA  . Esophageal cancer Neg Hx     Social History Social History   Tobacco Use  . Smoking status: Never Smoker  . Smokeless tobacco: Never Used  Substance Use Topics  . Alcohol use: Yes    Alcohol/week: 1.0 - 2.0 standard drinks    Types: 1 - 2 Glasses of wine per week    Comment: Ocassionally  . Drug use: No     Allergies   Patient has no known allergies.   Review of Systems Review of Systems  Constitutional: Positive for fever (did on Monday but resolved).  HENT: Negative for sore throat.   Eyes: Negative for visual disturbance.  Respiratory: Negative for cough and shortness of breath.   Cardiovascular: Negative for chest pain.  Gastrointestinal: Positive for abdominal pain and diarrhea (loose stool today). Negative for nausea and vomiting.  Genitourinary: Negative for difficulty urinating.  Musculoskeletal: Negative for back pain and neck stiffness.  Skin: Negative for rash.  Neurological: Positive for light-headedness. Negative for syncope and headaches.     Physical Exam Updated Vital Signs BP 137/69 (BP Location: Left Arm)   Pulse 70   Temp 99 F (37.2 C) (Oral)   Resp 16   Ht _0   (1.88 m)   Wt 79.4 kg   SpO2 92%   BMI 22.47 kg/m   Physical Exam Vitals signs and nursing note reviewed.  Constitutional:      General: He is not in acute distress.    Appearance: He is well-developed. He is not diaphoretic.  HENT:     Head: Normocephalic and atraumatic.  Eyes:     Conjunctiva/sclera: Conjunctivae normal.  Neck:     Musculoskeletal: Normal range of motion.  Cardiovascular:     Rate and Rhythm: Normal rate and regular rhythm.     Heart sounds: Normal heart sounds. No murmur. No friction rub. No gallop.   Pulmonary:     Effort: Pulmonary effort is normal. No respiratory distress.     Breath sounds: Normal breath sounds. No wheezing or rales.  Abdominal:     General: There is no distension.     Palpations: Abdomen is soft.     Tenderness: There is no abdominal tenderness. There is no guarding.  Skin:    General: Skin is warm and dry.  Neurological:     Mental Status: He is alert and oriented to person, place, and time.      ED Treatments / Results  Labs (all labs ordered are listed, but only abnormal results are displayed) Labs Reviewed  CBC WITH DIFFERENTIAL/PLATELET - Abnormal; Notable for the following components:      Result Value   RBC 4.17 (*)    Hemoglobin 12.5 (*)    HCT 38.5 (*)    Platelets 146 (*)    Lymphs Abs 0.4 (*)    All other components within normal limits  COMPREHENSIVE METABOLIC PANEL - Abnormal; Notable for the following components:   Potassium 2.8 (*)    Glucose, Bld 115 (*)    Creatinine, Ser 1.34 (*)    Calcium 8.1 (*)    Albumin 3.3 (*)    AST 161 (*)    ALT 183 (*)    Alkaline Phosphatase 446 (*)    Total Bilirubin 1.9 (*)    GFR calc non Af Amer 48 (*)    GFR calc Af Amer 55 (*)    All  other components within normal limits  URINALYSIS, ROUTINE W REFLEX MICROSCOPIC - Abnormal; Notable for the following components:   Color, Urine AMBER (*)    APPearance HAZY (*)    Ketones, ur 5 (*)    Protein, ur 100 (*)    All  other components within normal limits  MAGNESIUM - Abnormal; Notable for the following components:   Magnesium 1.6 (*)    All other components within normal limits  SARS CORONAVIRUS 2 (HOSPITAL ORDER, [facility])  CULTURE, BLOOD (ROUTINE X 2)  URINE CULTURE  CULTURE, BLOOD (ROUTINE X 2)  C DIFFICILE QUICK SCREEN W PCR REFLEX  LIPASE, BLOOD  COMPREHENSIVE METABOLIC PANEL  CBC WITH DIFFERENTIAL/PLATELET  MAGNESIUM  PHOSPHORUS    EKG EKG Interpretation  Date/Time:  Thursday March 03 2019 14:10:17 EDT Ventricular Rate:  98 PR Interval:    QRS Duration: 113 QT Interval:  481 QTC Calculation: 615 R Axis:   -28 Text Interpretation:  Sinus rhythm Atrial premature complex Borderline intraventricular conduction delay Borderline T wave abnormalities Prolonged QT interval Baseline wander in lead(s) V2 Since prior ECG, QTc prolonged  Confirmed by Gareth Morgan [id number]) on 03/03/2019 3:37:57 PM   Radiology No results found.  Procedures Procedures (including critical care time)  Medications Ordered in ED Medications  cefTRIAXone (ROCEPHIN) 2 g in sodium chloride 0.9 % 100 mL IVPB (2 g Intravenous New Bag/Given 03/03/19 2027)  vancomycin (VANCOCIN) 50 mg/mL oral solution 125 mg (125 mg Oral Given 03/03/19 1815)  acetaminophen (TYLENOL) tablet 500 mg (has no administration in time range)  HYDROcodone-acetaminophen (NORCO/VICODIN) 5-325 MG per tablet 1 tablet (has no administration in time range)  famotidine (PEPCID) tablet 20 mg (has no administration in time range)  dutasteride (AVODART) capsule 0.5 mg (0.5 mg Oral Given 03/03/19 1814)  mirabegron ER (MYRBETRIQ) tablet 50 mg (50 mg Oral Given 03/03/19 1815)  tamsulosin (FLOMAX) capsule 0.4 mg (0.4 mg Oral Given 03/03/19 1821)  gabapentin (NEURONTIN) capsule 300 mg (300 mg Oral Given 03/03/19 1821)  polyethylene glycol 0.4% and propylene glycol 0.3% (SYSTANE) ophthalmic gel (has no administration in time range)   morphine 2 MG/ML injection 2 mg (has no administration in time range)  potassium chloride SA (K-DUR) CR tablet 40 mEq (has no administration in time range)  sodium chloride 0.9 % bolus 1,000 mL (1,000 mLs Intravenous New Bag/Given 03/03/19 1657)  potassium chloride 10 mEq in 100 mL IVPB (10 mEq Intravenous New Bag/Given 03/03/19 1917)  potassium chloride SA (K-DUR) CR tablet 40 mEq (40 mEq Oral Given 03/03/19 1657)     Initial Impression / Assessment and Plan / ED Course  I have reviewed the triage vital signs and the nursing notes.  Pertinent labs & imaging results that were available during my care of the patient were reviewed by me and considered in my medical decision making (see chart for details).        85 year old male with a history of hypertension, hyperlipidemia, mitral valve prolapse status post mitral valve repair, wandering atrial pacemaker with frequent multifocal ectopy, coronary artery disease, history of cholecystectomy and history of common bile duct stones requiring ERCP, diagnosis of C. difficile colitis 01/18/2019, who presents with concern for lightheadedness in the setting of several episodes of right upper quadrant abdominal pain consistent with prior common bile duct stones.  He is scheduled for ERCP with Dr. Carlean Purl tomorrow.  Regarding lightheadedness, he has no leukocytosis, no significant anemia, no history to suggest GI bleed, pulmonary embolus, or MI.  Due to his right upper quadrant abdominal pain, he has not been eating as much, and suspect that some of his symptoms may be secondary to poor p.o. intake.  In addition, patient does report loose stool beginning today.  Labs returned significant for hypokalemia and an acute kidney injury.  Potassium of 2.8, patient currently on Cipro, prolonged QTC noted on EKG.  Given IV and p.o. potassium.  Labs show mild transaminitis, mild hyperbilirubinemia, elevated alk phos, similar to a few days ago and indicative of likely  biliary obstruction in patient with history of same.  Fever days ago concerning for possible cholangitis, however he is afebrile today, without leukocytosis and has no abdominal pain on my exam.  Discussed patient's symptoms, and lab work with gastroenterology, Dr. Carlean Purl was planning on ERCP tomorrow.  Gastroenterology came to bedside to evaluate him.  Will plan on admission for further care.    Final Clinical Impressions(s) / ED Diagnoses   Final diagnoses:  Biliary obstruction  Lightheadedness  Hypokalemia  AKI (acute kidney injury) [facility])    ED Discharge Orders    None       Gareth Morgan, MD 03/03/19 2236

## 2019-03-03 NOTE — H&P (Signed)
History and Physical    [name] [name]:[id number] DOB: [birth year] DOA: 03/03/2019  PCP: [name], MD  Patient coming from: Assisted living facility  I have personally briefly reviewed patient's old medical records available.   Chief Complaint: Abdominal pain for 5 days  HPI: [name] [name] is a 85 y.o. male with medical history significant of hypertension, BPH, C. difficile colitis x2 with recurrent C. difficile colitis a month ago, coronary artery disease fairly stable, hyperlipidemia, history of cholelithiasis status post cholecystectomy, history of choledocholithiasis status post ERCP presenting to the emergency room with colicky upper abdominal pain and low blood pressures.  According to the patient, started about 5 days ago, he had total 5 episodes of moderate intensity, colicky, right upper quadrant abdominal pain with no nausea vomiting.  He had similar pain when he had stone in his bile duct and had ERCP done 3 years ago.  Patient suffered from C. difficile about a year ago was treated with 2 weeks of vancomycin, he had recurrence a month ago was treated with 2 weeks of vancomycin.  Patient tried some hydrocodone at home with no relief.  He states 2 episodes of temperature on Monday it was up to 101. Patient had loose bowel movements starting last night, 5 total episodes, no blood or melena. Urinary habits are normal. No cough or cold.  Intermittent fever as above.  No congestion or flulike symptoms.  No nausea or vomiting.  Currently pain-free. Seen by gastroenterology 2 days ago, started on ciprofloxacin and plan for ERCP tomorrow. ED Course: Hemodynamically stable.  On room air.  Blood pressures are adequate.  WBC count is normal.  Potassium is 2.8.  Mildly elevated bilirubin with mild elevated transaminases.  Creatinine 1.34, slightly abnormal. Afebrile in the emergency room. Preprocedural COVID-19 03/02/2019 was negative. EKG shows prolonged QT with no definite T wave  probably due to hypokalemia. Case discussed with GI, plan for procedure tomorrow morning and admission requested.  Review of Systems: all systems are reviewed and pertinent positive as per HPI otherwise rest are negative.    Past Medical History:  Diagnosis Date  . Arthritis   . BPH (benign prostatic hyperplasia)   . C. difficile colitis 04/20/2018  . CAD (coronary artery disease)    s/p CABG x 1  . DJD (degenerative joint disease)   . Gallstones   . HTN (hypertension)   . Hyperlipidemia   . Lumbar disc disease   . MVP (mitral valve prolapse)    s/p Mitral Valve Repair  . Pancreatitis   . 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 CHOLECYSTECTOMY     Dr. Lennie Hummer  . COLONOSCOPY  ~2008   no polyps per pt.  Dr. [name]  . CORONARY ARTERY BYPASS GRAFT  January 2002   Single bypass to the distal RCA  . ERCP N/A 02/22/2013   Procedure: ENDOSCOPIC RETROGRADE CHOLANGIOPANCREATOGRAPHY (ERCP);  Surgeon: [name], MD;  Location: [facility];  Service: Endoscopy;  Laterality: N/A;  . ERCP N/A 09/21/2015   Procedure: ENDOSCOPIC RETROGRADE CHOLANGIOPANCREATOGRAPHY (ERCP);  Surgeon: [name], MD;  Location: [facility] ENDOSCOPY;  Service: Endoscopy;  Laterality: N/A;  with spyglass  . HERNIA REPAIR  05/05/11   Lap L inguinal & obturator herniae, R Femoral Hernia  . MITRAL VALVE REPAIR  January 2002  . SPYGLASS CHOLANGIOSCOPY N/A 02/22/2013  Procedure: SPYGLASS CHOLANGIOSCOPY;  Surgeon: [name], MD;  Location: [facility] ENDOSCOPY;  Service: Endoscopy;  Laterality: N/A;  . SPYGLASS CHOLANGIOSCOPY N/A 09/21/2015   Procedure: ZDGLOVFI CHOLANGIOSCOPY;  Surgeon: [name], MD;  Location: [facility];  Service: Endoscopy;  Laterality: N/A;  Bess Kinds LITHOTRIPSY N/A 02/22/2013   Procedure: EPPIRJJO LITHOTRIPSY;  Surgeon: [name], MD;  Location:  [facility] ENDOSCOPY;  Service: Endoscopy;  Laterality: N/A;  litho ord'd 7/8 by DL/PO [id number] [facility]  . UPPER GASTROINTESTINAL ENDOSCOPY  02/22/13     reports that he has never smoked. He has never used smokeless tobacco. He reports current alcohol use of about 1.0 - 2.0 standard drinks of alcohol per week. He reports that he does not use drugs.  No Known Allergies  Family History  Problem Relation Age of Onset  . Stroke Father   . Stroke Mother        TIA  . Esophageal cancer Neg Hx      Prior to Admission medications   Medication Sig Start Date End Date Taking? Authorizing Provider  acetaminophen (TYLENOL) 500 MG tablet Take 500 mg by mouth every 6 (six) hours as needed for moderate pain.    [provider]  amLODipine (NORVASC) 5 MG tablet take 1 tablet by mouth once daily every evening Patient taking differently: Take 5 mg by mouth every evening. take 1 tablet by mouth once daily every evenin 11/19/18   Martinique, Peter M, MD  aspirin 81 MG tablet Take 81 mg by mouth daily with supper.     [provider]  b complex vitamins tablet Take 1 tablet by mouth daily.    [provider]  Cholecalciferol (VITAMIN D) 1000 UNITS capsule Take 1,000 Units by mouth daily.      [provider]  ciprofloxacin (CIPRO) 500 MG tablet Take 1 tablet (500 mg total) by mouth 2 (two) times daily. 03/01/19   [name], MD  dutasteride (AVODART) 0.5 MG capsule Take 0.5 mg by mouth every evening.     [provider]  famotidine (PEPCID) 20 MG tablet Take 20 mg by mouth daily as needed for heartburn or indigestion.    [provider]  gabapentin (NEURONTIN) 300 MG capsule Take 300 mg by mouth 4 (four) times daily.    [provider]  GLUCOSAMINE-CHONDROITIN PO Take 1 tablet by mouth 2 (two) times a day.     [provider]  HYDROcodone-acetaminophen (NORCO/VICODIN) 5-325 MG tablet Take 1 tablet by mouth every 6 (six) hours as needed for moderate  pain.  05/15/16   [provider]  MYRBETRIQ 50 MG TB24 tablet Take 1 tablet by mouth daily with supper.  09/03/16   [provider]  Polyethyl Glycol-Propyl Glycol (SYSTANE OP) Place 1 drop into both eyes daily as needed (dry eyes).    [provider]  rosuvastatin (CRESTOR) 20 MG tablet Take 1 tablet (20 mg total) by mouth daily. Patient taking differently: Take 20 mg by mouth daily with supper.  01/24/19   [name], MD  Simethicone (GAS-X PO) Take 1 tablet by mouth daily as needed (gas).     [provider]  Tamsulosin HCl (FLOMAX) 0.4 MG CAPS Take 0.4 mg by mouth every evening.     [provider]  valsartan-hydrochlorothiazide (DIOVAN-HCT) 320-12.5 MG tablet Take 1 tablet by mouth every morning. 01/25/19   Martinique, Peter M, MD  Wheat Dextrin (BENEFIBER PO) Take 1 Dose by mouth daily.  [provider]    Physical Exam: Vitals:   03/03/19 1351 03/03/19 1445 03/03/19 1500 03/03/19 1515  BP: (!) 120/93 (!) 149/89 (!) 149/74 135/77  Pulse: (!) 55 (!) 48 (!) 42 (!) 49  Resp: 14 19 (!) 25 16  Temp: 98.2 F (36.8 C)     TempSrc: Oral     SpO2: 98% 97% 100% 96%  Weight:      Height:        Constitutional: NAD, calm, comfortable Vitals:   03/03/19 1351 03/03/19 1445 03/03/19 1500 03/03/19 1515  BP: (!) 120/93 (!) 149/89 (!) 149/74 135/77  Pulse: (!) 55 (!) 48 (!) 42 (!) 49  Resp: 14 19 (!) 25 16  Temp: 98.2 F (36.8 C)     TempSrc: Oral     SpO2: 98% 97% 100% 96%  Weight:      Height:       Eyes: PERRL, lids and conjunctivae normal ENMT: Mucous membranes are moist. Posterior pharynx clear of any exudate or lesions.Normal dentition.  Neck: normal, supple, no masses, no thyromegaly Respiratory: clear to auscultation bilaterally, no wheezing, no crackles. Normal respiratory effort. No accessory muscle use.  Cardiovascular: Regular rate and rhythm, no murmurs / rubs / gallops. No extremity edema. 2+ pedal pulses. No carotid  bruits.  Abdomen: no tenderness, no masses palpated. No hepatosplenomegaly. Bowel sounds positive.  Musculoskeletal: no clubbing / cyanosis. No joint deformity upper and lower extremities. Good ROM, no contractures. Normal muscle tone.  Skin: no rashes, lesions, ulcers. No induration Neurologic: CN 2-12 grossly intact. Sensation intact, DTR normal. Strength 5/5 in all 4.  Psychiatric: Normal judgment and insight. Alert and oriented x 3. Normal mood.     Labs on Admission: I have personally reviewed following labs and imaging studies  CBC: Recent Labs  Lab 03/03/19 1417  WBC 6.8  NEUTROABS 5.8  HGB 12.5*  HCT 38.5*  MCV 92.3  PLT 884*   Basic Metabolic Panel: Recent Labs  Lab 03/03/19 1417  NA 137  K 2.8*  CL 99  CO2 27  GLUCOSE 115*  BUN 19  CREATININE 1.34*  CALCIUM 8.1*   GFR: Estimated Creatinine Clearance: 44.4 mL/min (A) (by C-G formula based on SCr of 1.34 mg/dL (H)). Liver Function Tests: Recent Labs  Lab 03/01/19 1112 03/03/19 1417  AST 197* 161*  ALT 96* 183*  ALKPHOS 403* 446*  BILITOT 2.5* 1.9*  PROT 7.0 6.6  ALBUMIN 4.0 3.3*   Recent Labs  Lab 03/03/19 1417  LIPASE 27   No results for input(s): AMMONIA in the last 168 hours. Coagulation Profile: No results for input(s): INR, PROTIME in the last 168 hours. Cardiac Enzymes: No results for input(s): CKTOTAL, CKMB, CKMBINDEX, TROPONINI in the last 168 hours. BNP (last 3 results) No results for input(s): PROBNP in the last 8760 hours. HbA1C: No results for input(s): HGBA1C in the last 72 hours. CBG: No results for input(s): GLUCAP in the last 168 hours. Lipid Profile: No results for input(s): CHOL, HDL, LDLCALC, TRIG, CHOLHDL, LDLDIRECT in the last 72 hours. Thyroid Function Tests: No results for input(s): TSH, T4TOTAL, FREET4, T3FREE, THYROIDAB in the last 72 hours. Anemia Panel: No results for input(s): VITAMINB12, FOLATE, FERRITIN, TIBC, IRON, RETICCTPCT in the last 72 hours. Urine  analysis:    Component Value Date/Time   COLORURINE AMBER (A) 03/03/2019 1519   APPEARANCEUR HAZY (A) 03/03/2019 1519   LABSPEC 1.021 03/03/2019 1519   PHURINE 5.0 03/03/2019 1519   GLUCOSEU NEGATIVE 03/03/2019 1519  HGBUR NEGATIVE 03/03/2019 [place] 03/03/2019 1519   BILIRUBINUR neg 12/24/2012 1324   KETONESUR 5 (A) 03/03/2019 1519   PROTEINUR 100 (A) 03/03/2019 1519   UROBILINOGEN 1.0 10/03/2013 2148   NITRITE NEGATIVE 03/03/2019 1519   LEUKOCYTESUR NEGATIVE 03/03/2019 1519    Radiological Exams on Admission: No results found.  EKG: Independently reviewed.  Sinus rhythm.  QTC 615 with flat T wave probably due to electrolyte abnormalities.  Previous EKG with QTC of 495.  Assessment/Plan Principal Problem:   Cholangitis due to bile duct calculus with obstruction Active Problems:   CAD (coronary artery disease)   HTN (hypertension)   Hyperlipidemia   C. difficile colitis   Hypokalemia due to excessive gastrointestinal loss of potassium     1.  Cholangitis with choledocholithiasis and biliary obstruction: Agree with admission to the hospital given severity of symptoms. IV fluids.  Clear liquid diet.  N.p.o. past midnight for possible ERCP in the morning by GI. Discontinue ciprofloxacin, will try to use as narrow spectrum as possible with Rocephin with history of recurrent C. Difficile. Blood cultures to be drawn that will help determine course of antibiotic treatment as patient had fever at home. Adequate pain medications including oral and IV pain opiates.  2.  Probable C. difficile infection with diarrhea: 5 episodes of diarrhea in a patient with recent C. difficile diarrhea and using antibiotics. Very high suspicion of recurrent C. Difficile. Prophylactic vancomycin pending results while using antibiotics. Enteric precautions.  3.  Hypokalemia: Due to ongoing GI loss.  Replace aggressively IV and oral.  Recheck levels to ensure stabilization.   Check magnesium and phosphorus.  4.  Hypertension: Blood pressures are low normal and acceptable.  Holding antihypertensives.  5.  Prolonged QTC: With history of prolonged QT of less than 500.  EKG showed prolonged QTC probably due to hypo-kalemia.  Will aggressively replace potassium.  Will recheck EKG in the morning.  6.  History of coronary artery disease: Stable with no evidence of chest pain or acute coronary syndrome.  Aspirin and statin to be resumed after procedure.  Patient has severe systemic disease.  He has acute infection, will need IV antibiotics and fluids in the hospital.  Without admission he has high risk of decompensation and deterioration of illness.  DVT prophylaxis: SCDs pending procedure Code Status: Full code Family Communication: None Disposition Plan: Home after hospitalization Consults called: Gastroenterology, discussed at bedside Admission status: Inpatient   Barb Merino MD [facility] Pager [phone]  If 7PM-7AM, please contact night-coverage [email or web address] Password [email or web address]  03/03/2019, 4:40 PM

## 2019-03-04 ENCOUNTER — Inpatient Hospital Stay (HOSPITAL_COMMUNITY): Payer: Medicare Other | Admitting: Registered Nurse

## 2019-03-04 ENCOUNTER — Encounter (HOSPITAL_COMMUNITY): Payer: Self-pay | Admitting: *Deleted

## 2019-03-04 ENCOUNTER — Ambulatory Visit (HOSPITAL_COMMUNITY): Payer: Medicare Other

## 2019-03-04 ENCOUNTER — Inpatient Hospital Stay (HOSPITAL_COMMUNITY): Payer: Medicare Other

## 2019-03-04 ENCOUNTER — Encounter (HOSPITAL_COMMUNITY)
Admission: EM | Disposition: A | Discharge status: Home / Self Care | Payer: Self-pay | Source: Home / Self Care | Attending: Internal Medicine

## 2019-03-04 ENCOUNTER — Ambulatory Visit (HOSPITAL_COMMUNITY): Admission: RE | Admit: 2019-03-04 | Payer: Medicare Other | Source: Home / Self Care | Admitting: Internal Medicine

## 2019-03-04 ENCOUNTER — Other Ambulatory Visit: Payer: Self-pay | Admitting: Internal Medicine

## 2019-03-04 DIAGNOSIS — K8031 Calculus of bile duct with cholangitis, unspecified, with obstruction: Principal | ICD-10-CM

## 2019-03-04 HISTORY — PX: REMOVAL OF STONES: SHX5545

## 2019-03-04 HISTORY — PX: ERCP: SHX5425

## 2019-03-04 LAB — URINE CULTURE: Culture: NO GROWTH

## 2019-03-04 LAB — CBC WITH DIFFERENTIAL/PLATELET
Abs Immature Granulocytes: 0.02 10*3/uL (ref 0.00–0.07)
Basophils Absolute: 0 10*3/uL (ref 0.0–0.1)
Basophils Relative: 0 %
Eosinophils Absolute: 0.1 10*3/uL (ref 0.0–0.5)
Eosinophils Relative: 2 %
HCT: 33.5 % — ABNORMAL LOW (ref 39.0–52.0)
Hemoglobin: 10.7 g/dL — ABNORMAL LOW (ref 13.0–17.0)
Immature Granulocytes: 0 %
Lymphocytes Relative: 10 %
Lymphs Abs: 0.5 10*3/uL — ABNORMAL LOW (ref 0.7–4.0)
MCH: 29.5 pg (ref 26.0–34.0)
MCHC: 31.9 g/dL (ref 30.0–36.0)
MCV: 92.3 fL (ref 80.0–100.0)
Monocytes Absolute: 0.6 10*3/uL (ref 0.1–1.0)
Monocytes Relative: 11 %
Neutro Abs: 3.9 10*3/uL (ref 1.7–7.7)
Neutrophils Relative %: 77 %
Platelets: 123 10*3/uL — ABNORMAL LOW (ref 150–400)
RBC: 3.63 MIL/uL — ABNORMAL LOW (ref 4.22–5.81)
RDW: 13.4 % (ref 11.5–15.5)
WBC: 5.1 10*3/uL (ref 4.0–10.5)
nRBC: 0 % (ref 0.0–0.2)

## 2019-03-04 LAB — COMPREHENSIVE METABOLIC PANEL
ALT: 118 U/L — ABNORMAL HIGH (ref 0–44)
AST: 77 U/L — ABNORMAL HIGH (ref 15–41)
Albumin: 2.7 g/dL — ABNORMAL LOW (ref 3.5–5.0)
Alkaline Phosphatase: 325 U/L — ABNORMAL HIGH (ref 38–126)
Anion gap: 9 (ref 5–15)
BUN: 13 mg/dL (ref 8–23)
CO2: 24 mmol/L (ref 22–32)
Calcium: 7.3 mg/dL — ABNORMAL LOW (ref 8.9–10.3)
Chloride: 102 mmol/L (ref 98–111)
Creatinine, Ser: 1.03 mg/dL (ref 0.61–1.24)
GFR calc Af Amer: >60 mL/min (ref >60)
GFR calc non Af Amer: >60 mL/min (ref >60)
Glucose, Bld: 95 mg/dL (ref 70–99)
Potassium: 3.1 mmol/L — ABNORMAL LOW (ref 3.5–5.1)
Sodium: 135 mmol/L (ref 135–145)
Total Bilirubin: 1 mg/dL (ref 0.3–1.2)
Total Protein: 5.3 g/dL — ABNORMAL LOW (ref 6.5–8.1)

## 2019-03-04 LAB — BASIC METABOLIC PANEL
Anion gap: 8 (ref 5–15)
BUN: 11 mg/dL (ref 8–23)
CO2: 26 mmol/L (ref 22–32)
Calcium: 7.9 mg/dL — ABNORMAL LOW (ref 8.9–10.3)
Chloride: 105 mmol/L (ref 98–111)
Creatinine, Ser: 1.02 mg/dL (ref 0.61–1.24)
GFR calc Af Amer: >60 mL/min (ref >60)
GFR calc non Af Amer: >60 mL/min (ref >60)
Glucose, Bld: 164 mg/dL — ABNORMAL HIGH (ref 70–99)
Potassium: 4.3 mmol/L (ref 3.5–5.1)
Sodium: 139 mmol/L (ref 135–145)

## 2019-03-04 LAB — MAGNESIUM
Magnesium: 1.4 mg/dL — ABNORMAL LOW (ref 1.7–2.4)
Magnesium: 1.8 mg/dL (ref 1.7–2.4)

## 2019-03-04 LAB — PHOSPHORUS: Phosphorus: 2.2 mg/dL — ABNORMAL LOW (ref 2.5–4.6)

## 2019-03-04 SURGERY — ERCP, WITH INTERVENTION IF INDICATED
Anesthesia: General

## 2019-03-04 MED ORDER — LIDOCAINE 2% (20 MG/ML) 5 ML SYRINGE
INTRAMUSCULAR | Status: DC | PRN
  Administered 2019-03-04: 80 mg via INTRAVENOUS

## 2019-03-04 MED ORDER — GLUCAGON HCL RDNA (DIAGNOSTIC) 1 MG IJ SOLR
INTRAMUSCULAR | Status: AC
  Filled 2019-03-04: qty 1

## 2019-03-04 MED ORDER — EPHEDRINE SULFATE-NACL 50-0.9 MG/10ML-% IV SOSY
PREFILLED_SYRINGE | INTRAVENOUS | Status: DC | PRN
  Administered 2019-03-04: 10 mg via INTRAVENOUS

## 2019-03-04 MED ORDER — SUGAMMADEX SODIUM 200 MG/2ML IV SOLN
INTRAVENOUS | Status: DC | PRN
  Administered 2019-03-04: 160 mg via INTRAVENOUS

## 2019-03-04 MED ORDER — PROPOFOL 10 MG/ML IV BOLUS
INTRAVENOUS | Status: DC | PRN
  Administered 2019-03-04: 130 mg via INTRAVENOUS

## 2019-03-04 MED ORDER — ROCURONIUM BROMIDE 10 MG/ML (PF) SYRINGE
PREFILLED_SYRINGE | INTRAVENOUS | Status: DC | PRN
  Administered 2019-03-04: 20 mg via INTRAVENOUS

## 2019-03-04 MED ORDER — INDOMETHACIN 50 MG RE SUPP
RECTAL | Status: AC
  Filled 2019-03-04: qty 2

## 2019-03-04 MED ORDER — INDOMETHACIN 50 MG RE SUPP
RECTAL | Status: DC | PRN
  Administered 2019-03-04: 100 mg via RECTAL

## 2019-03-04 MED ORDER — FENTANYL CITRATE (PF) 100 MCG/2ML IJ SOLN
INTRAMUSCULAR | Status: AC
  Filled 2019-03-04: qty 2

## 2019-03-04 MED ORDER — MAGNESIUM SULFATE 2 GM/50ML IV SOLN
2.0000 g | Freq: Once | INTRAVENOUS | Status: AC
  Administered 2019-03-04: 2 g via INTRAVENOUS
  Filled 2019-03-04 (×2): qty 50

## 2019-03-04 MED ORDER — INDOMETHACIN 50 MG RE SUPP: 100.0000 mg | Freq: Once | RECTAL | Status: DC

## 2019-03-04 MED ORDER — PHENYLEPHRINE 40 MCG/ML (10ML) SYRINGE FOR IV PUSH (FOR BLOOD PRESSURE SUPPORT)
PREFILLED_SYRINGE | INTRAVENOUS | Status: DC | PRN
  Administered 2019-03-04: 80 ug via INTRAVENOUS

## 2019-03-04 MED ORDER — FENTANYL CITRATE (PF) 100 MCG/2ML IJ SOLN
INTRAMUSCULAR | Status: DC | PRN
  Administered 2019-03-04: 50 ug via INTRAVENOUS

## 2019-03-04 MED ORDER — LACTATED RINGERS IV SOLN
INTRAVENOUS | Status: DC
  Administered 2019-03-04: 08:00:00 via INTRAVENOUS

## 2019-03-04 MED ORDER — ONDANSETRON HCL 4 MG/2ML IJ SOLN
INTRAMUSCULAR | Status: DC | PRN
  Administered 2019-03-04: 4 mg via INTRAVENOUS

## 2019-03-04 MED ORDER — POTASSIUM CHLORIDE 10 MEQ/100ML IV SOLN
10.0000 meq | INTRAVENOUS | Status: AC
  Administered 2019-03-04 (×2): 10 meq via INTRAVENOUS
  Filled 2019-03-04 (×2): qty 100

## 2019-03-04 MED ORDER — K PHOS MONO-SOD PHOS DI & MONO 155-852-130 MG PO TABS
250.0000 mg | ORAL_TABLET | Freq: Three times a day (TID) | ORAL | Status: AC
  Administered 2019-03-04 (×2): 250 mg via ORAL
  Filled 2019-03-04 (×2): qty 1

## 2019-03-04 MED ORDER — SODIUM CHLORIDE 0.9 % IV SOLN
INTRAVENOUS | Status: DC | PRN
  Administered 2019-03-04: 09:00:00 30 mL

## 2019-03-04 MED ORDER — PROPOFOL 10 MG/ML IV BOLUS
INTRAVENOUS | Status: AC
  Filled 2019-03-04: qty 20

## 2019-03-04 MED ORDER — SUCCINYLCHOLINE CHLORIDE 200 MG/10ML IV SOSY
PREFILLED_SYRINGE | INTRAVENOUS | Status: DC | PRN
  Administered 2019-03-04: 100 mg via INTRAVENOUS

## 2019-03-04 MED ORDER — ASPIRIN EC 81 MG PO TBEC: 81.0000 mg | DELAYED_RELEASE_TABLET | Freq: Every day | ORAL | Status: DC

## 2019-03-04 MED ORDER — DEXAMETHASONE SODIUM PHOSPHATE 10 MG/ML IJ SOLN
INTRAMUSCULAR | Status: DC | PRN
  Administered 2019-03-04: 8 mg via INTRAVENOUS

## 2019-03-04 NOTE — Transfer of Care (Signed)
Immediate Anesthesia Transfer of Care Note  Patient: [name] [name]  Procedure(s) Performed: ENDOSCOPIC RETROGRADE CHOLANGIOPANCREATOGRAPHY (ERCP) (N/A )  Patient Location: PACU and Endoscopy Unit  Anesthesia Type:General  Level of Consciousness: awake, alert , oriented and patient cooperative  Airway & Oxygen Therapy: Patient Spontanous Breathing and Patient connected to face mask oxygen  Post-op Assessment: Report given to RN, Post -op Vital signs reviewed and stable and Patient moving all extremities  Post vital signs: Reviewed and stable  Last Vitals:  Vitals Value Taken Time  BP 156/74 03/04/19 0900  Temp    Pulse 62 03/04/19 0901  Resp 15 03/04/19 0901  SpO2 100 % 03/04/19 0901  Vitals shown include unvalidated device data.  Last Pain:  Vitals:   03/04/19 0741  TempSrc: Oral  PainSc: 0-No pain         Complications: No apparent anesthesia complications

## 2019-03-04 NOTE — Anesthesia Procedure Notes (Signed)
Procedure Name: Intubation Date/Time: 03/04/2019 8:15 AM Performed by: Victoriano Lain, CRNA Pre-anesthesia Checklist: Patient identified, Emergency Drugs available, Suction available, Patient being monitored and Timeout performed Patient Re-evaluated:Patient Re-evaluated prior to induction Oxygen Delivery Method: Circle system utilized Preoxygenation: Pre-oxygenation with 100% oxygen Induction Type: Rapid sequence and Cricoid Pressure applied Laryngoscope Size: Mac and 4 Grade View: Grade I Tube type: Oral Tube size: 7.5 mm Number of attempts: 1 Airway Equipment and Method: Stylet Placement Confirmation: ETT inserted through vocal cords under direct vision,  positive ETCO2 and breath sounds checked- equal and bilateral Secured at: 21 cm Tube secured with: Tape Dental Injury: Teeth and Oropharynx as per pre-operative assessment

## 2019-03-04 NOTE — Anesthesia Preprocedure Evaluation (Addendum)
Anesthesia Evaluation  Patient identified by MRN, date of birth, ID band Patient awake    Reviewed: Allergy & Precautions, NPO status , Patient's Chart, lab work & pertinent test results  Airway Mallampati: II  TM Distance: >3 FB Neck ROM: Full    Dental no notable dental hx.    Pulmonary neg pulmonary ROS,    Pulmonary exam normal breath sounds clear to auscultation       Cardiovascular hypertension, Pt. on medications + CAD and + CABG  Normal cardiovascular exam Rhythm:Regular Rate:Normal     Neuro/Psych negative neurological ROS  negative psych ROS   GI/Hepatic negative GI ROS, Neg liver ROS,   Endo/Other  negative endocrine ROS  Renal/GU negative Renal ROS  negative genitourinary   Musculoskeletal negative musculoskeletal ROS (+)   Abdominal   Peds negative pediatric ROS (+)  Hematology negative hematology ROS (+)   Anesthesia Other Findings Spinal stenosis  Reproductive/Obstetrics negative OB ROS                            Anesthesia Physical Anesthesia Plan  ASA: III  Anesthesia Plan: General   Post-op Pain Management:    Induction: Intravenous  PONV Risk Score and Plan: 2 and Ondansetron and Treatment may vary due to age or medical condition  Airway Management Planned: Oral ETT  Additional Equipment:   Intra-op Plan:   Post-operative Plan: Extubation in OR  Informed Consent: I have reviewed the patients History and Physical, chart, labs and discussed the procedure including the risks, benefits and alternatives for the proposed anesthesia with the patient or authorized representative who has indicated his/her understanding and acceptance.     Dental advisory given  Plan Discussed with: CRNA  Anesthesia Plan Comments:         Anesthesia Quick Evaluation

## 2019-03-04 NOTE — Op Note (Signed)
[facility] Patient Name: [name] [name] Procedure Date: 03/04/2019 MRN: [id number] Attending MD: [name] , MD Date of Birth: September 05, 1929 CSN: [id number] Age: 85 Admit Type: Inpatient Procedure:                ERCP Indications:              Bile duct stone(s) Providers:                [name], MD, Cleda Daub, RN, Cherylynn Ridges, Technician, Cletis Athens, Technician Referring MD:              Medicines:                General Anesthesia, Running IV Ceftriaxone Complications:            No immediate complications. Estimated Blood Loss:     Estimated blood loss: none. Procedure:                Pre-Anesthesia Assessment:                           - Prior to the procedure, a History and Physical                            was performed, and patient medications and                            allergies were reviewed. The patient's tolerance of                            previous anesthesia was also reviewed. The risks                            and benefits of the procedure and the sedation                            options and risks were discussed with the patient.                            All questions were answered, and informed consent                            was obtained. Prior Anticoagulants: The patient has                            taken no previous anticoagulant or antiplatelet                            agents. ASA Grade Assessment: III - A patient with                            severe systemic disease. After reviewing the risks  and benefits, the patient was deemed in                            satisfactory condition to undergo the procedure.                           After obtaining informed consent, the scope was                            passed under direct vision. Throughout the                            procedure, the patient's blood pressure, pulse, and   oxygen saturations were monitored continuously. The                            TJF-Q180V ([id number] Olympus duodenoscope was                            introduced through the mouth, and used to inject                            contrast into and used to inject contrast into the                            bile duct. The ERCP was accomplished without                            difficulty. The patient tolerated the procedure                            well. Scope In: Scope Out: Findings:      A scout film of the abdomen was obtained. Surgical clips, consistent       with a previous cholecystectomy, were seen in the area of the right       upper quadrant of the abdomen. Esophagus not seen well.      Stomach normal      duodenum notable for prior sphincterotomy at papilla and what looks like       somewhat of a diverticulum inferior to biliary orifice.      Cannulated w/ sphincterotome, some sludge/debris out, no pus.      The Wire was placed, injection showed dilated duct max 15-16 mm -       balloon placed and multiple sweeps produced sludge and stone material       out with a negative occlusion cholangiogram. No pancreatic cannulation       by intent. Prophylactic indomethacin given. Impression:               - Choledocholithiasis was found. Complete removal                            was accomplished by balloon extraction. Moderate Sedation:      Not Applicable - Patient had care per Anesthesia. Recommendation:           - Return patient to hospital ward for ongoing care.                           -  I think best to keep him until tomorrow and if ok                            then home.                           I have placed orders for him to do labs at my                            office weed 7//29 and created appointment in dc                            summary - I will then coordinate follow-up from                            there.                           unless blood cultures +  I don't think he needs abx                           His stool submitted was formed so he does not have                            C diff - I stopped vancomycin.                           in the past I put him ursodiol and he has come off                            that - he has had post C diff IBS - I will consider                            adding the ursodiol back as an outpatient to reduce                            fuure stone formation Procedure Code(s):        --- Professional ---                           [id number], Endoscopic retrograde                            cholangiopancreatography (ERCP); with removal of                            calculi/debris from biliary/pancreatic duct(s) Diagnosis Code(s):        --- Professional ---                           K80.50, Calculus of bile duct without cholangitis  or cholecystitis without obstruction CPT copyright 2019 American Medical Association. All rights reserved. The codes documented in this report are preliminary and upon coder review may  be revised to meet current compliance requirements. [name], MD 03/04/2019 9:04:35 AM This report has been signed electronically. Number of Addenda: 0

## 2019-03-04 NOTE — Progress Notes (Signed)
   I called wife and updated her about results and plans and messaged son Dr. Marya Amsler Needham to call me when able.

## 2019-03-04 NOTE — Anesthesia Postprocedure Evaluation (Signed)
Anesthesia Post Note  Patient: MARQUES ERICSON  Procedure(s) Performed: ENDOSCOPIC RETROGRADE CHOLANGIOPANCREATOGRAPHY (ERCP) (N/A ) REMOVAL OF STONES     Patient location during evaluation: PACU Anesthesia Type: General Level of consciousness: awake and alert Pain management: pain level controlled Vital Signs Assessment: post-procedure vital signs reviewed and stable Respiratory status: spontaneous breathing, nonlabored ventilation, respiratory function stable and patient connected to nasal cannula oxygen Cardiovascular status: blood pressure returned to baseline and stable Postop Assessment: no apparent nausea or vomiting Anesthetic complications: no    Last Vitals:  Vitals:   03/04/19 0910 03/04/19 0920  BP: (!) 159/79 (!) 164/84  Pulse: (!) 31 (!) 55  Resp: 17 13  Temp:    SpO2: 97% 96%    Last Pain:  Vitals:   03/04/19 0900  TempSrc: Temporal  PainSc: 0-No pain                 Montez Hageman

## 2019-03-04 NOTE — Interval H&P Note (Signed)
History and Physical Interval Note:  03/04/2019 8:05 AM  [name] [name]  has presented today for surgery, with the diagnosis of choledocholithiasis.  The various methods of treatment have been discussed with the patient and family. After consideration of risks, benefits and other options for treatment, the patient has consented to  Procedure(s): ENDOSCOPIC RETROGRADE CHOLANGIOPANCREATOGRAPHY (ERCP) (N/A) as a surgical intervention.  The patient's history has been reviewed, patient examined, no change in status, stable for surgery.  I have reviewed the patient's chart and labs.  Questions were answered to the patient's satisfaction.     Silvano Rusk

## 2019-03-04 NOTE — Progress Notes (Signed)
PROGRESS NOTE    [name] [name]  HYI:[id number] DOB: 05-09-1930 DOA: 03/03/2019 PCP: [name], MD   Brief Narrative: Per HPI 85 y.o. male with PMH of hypertension, BPH, C. difficile colitis x2 with recurrent C. difficile colitis a month ago,  CAD-stable,HLD, history of cholelithiasis s./p cholecystectomy/history of choledocholithiasis s/p ERCP admitted with colicky abdominal pain for 5 days- pain is in the right upper quadrant similar to his previous pain when he had ERCP for stone 3 years ago. He had C. difficile about a year ago was treated with 2 weeks of vancomycin, he had recurrence a month ago was treated with 2 weeks of vancomycin.  Patient tried some hydrocodone at home with no relief.  He states 2 episodes of temperature on Monday it was up to 101. Seen by gastroenterology 2 days ago, started on ciprofloxacin and plan for ERCP 7/24  ED Course: Hemodynamically stable. On room air.  Blood pressures are adequate.  WBC count is normal.  Potassium is 2.8.  Mildly elevated bilirubin with mild elevated transaminases.  Creatinine 1.34, slightly abnormal.Afebrile Preprocedural COVID-19 03/02/2019 was negative. EKG shows prolonged QT with no definite T wave probably due to hypokalemia. Case discussed with GI, plan for procedure 7/24 and admission was requested.  Subjective: Seen this morning post procedure.  He feels well no nausea vomiting or pain.  No fever.  Assessment & Plan:   Right upper quadrant abdominal pain suspecting biliary colic possible cholangitis with choledocholithiasis/biliary obstruction, given abnormal LFTs: Status post ERCP this morning-findings: Choledocholithiasis and biliary dilatation, balloon sweep maneuver was performed to extract calculi.  Repeat LFTs in the morning, plan as per gastroenterology.  Fever at home in the setting of, #1 suspecting cholangitis on ceftriaxone.  Was on Cipro for 2 days PTA and has been discontinued.  No leukocytosis currently.  Follow-up on blood culture to streamline the antibiotics.  Diarrhea at home, with recent history of C. difficile currently on empiric vancomycin while on IV antibiotics.Stool was formed-unable to check for C. Difficile. Watch. Keep on contact precautions.    CAD: Stable,no chest pain.  Resume aspirin tomorrow and check lfts in am before resuming statin.  Essential NOM:VEHMCNOB is stable.  Slightly uptrending, resume home meds slowly- meds held as blood pressure soft on admission.  AKI: Creatinine improved with IV hydration.  Hypokalemia/hypophosphatemia/hypomagnesemia: Likely from decreased oral intake/GI loss: Repleting aggressively recheck in the morning.   Prolonged QTC: likely from electrolyte imbalance.  Replete electrolytes and monitor EKG. ekg pending.  DVT prophylaxis: SCD.ambulate. Code Status:  Family Communication: plan of care discussed with patient in detail. Disposition Plan: Remains inpatient pending clinical improvement.   Consultants:  Gastroenterology Procedures: 7/24 ERCP: Choledocholithiasis and biliary dilatation, balloon sweep maneuver was performed to extract calculi.  Microbiology:  Antimicrobials: Anti-infectives (From admission, onward)   Start     Dose/Rate Route Frequency Ordered Stop   03/03/19 1800  vancomycin (VANCOCIN) 50 mg/mL oral solution 125 mg  Status:  Discontinued     125 mg Oral 4 times daily 03/03/19 1617 03/04/19 0947   03/03/19 1700  cefTRIAXone (ROCEPHIN) 2 g in sodium chloride 0.9 % 100 mL IVPB     2 g 200 mL/hr over 30 Minutes Intravenous Every 24 hours 03/03/19 1617         Objective: Vitals:   03/04/19 0741 03/04/19 0900 03/04/19 0910 03/04/19 0920  BP: (!) 159/87 (!) 156/74 (!) 159/79 (!) 164/84  Pulse: 88 74 (!) 31 (!) 55  Resp:  16 17  13  Temp: 98.3 F (36.8 C) 98.9 F (37.2 C)    TempSrc: Oral Temporal    SpO2: 95% 100% 97% 96%  Weight: 79.4 kg     Height: 6' 2"  (1.88 m)       Intake/Output Summary (Last 24  hours) at 03/04/2019 1021 Last data filed at 03/04/2019 0901 Gross per 24 hour  Intake 1976.58 ml  Output 650 ml  Net 1326.58 ml   Filed Weights   03/03/19 1349 03/04/19 0741  Weight: 79.4 kg 79.4 kg   Weight change:   Body mass index is 22.47 kg/m.  Intake/Output from previous day: 07/23 0701 - 07/24 0700 In: 1213.6 [P.O.:120; IV Piggyback:1093.6] Out: 650 [Urine:650] Intake/Output this shift: Total I/O In: 763 [I.V.:763] Out: -   Examination:  General exam: Appears calm and comfortable,Not in distress, elderly gentleman. HEENT:PERRL,Oral mucosa moist, Ear/Nose normal on gross exam Respiratory system: Bilateral equal air entry, normal vesicular breath sounds, no wheezes or crackles  Cardiovascular system: S1 & S2 heard,No JVD, murmurs. Gastrointestinal system: Abdomen is  soft, non tender, non distended, BS +  Nervous System:Alert and oriented. No focal neurological deficits/moving extremities, sensation intact. Extremities: No edema, no clubbing, distal peripheral pulses palpable. Skin: No rashes, lesions, no icterus MSK: Normal muscle bulk,tone ,power  Medications:  Scheduled Meds: . dutasteride  0.5 mg Oral QPM  . famotidine  20 mg Oral BID  . gabapentin  300 mg Oral QID  . indomethacin  100 mg Rectal Once  . mirabegron ER  50 mg Oral Q supper  . phosphorus  250 mg Oral TID  . potassium chloride  40 mEq Oral BID  . tamsulosin  0.4 mg Oral QPM   Continuous Infusions: . cefTRIAXone (ROCEPHIN)  IV 2 g (03/03/19 2027)  . magnesium sulfate bolus IVPB      Data Reviewed: I have personally reviewed following labs and imaging studies  CBC: Recent Labs  Lab 03/03/19 1417 03/04/19 0351  WBC 6.8 5.1  NEUTROABS 5.8 3.9  HGB 12.5* 10.7*  HCT 38.5* 33.5*  MCV 92.3 92.3  PLT 146* 703*   Basic Metabolic Panel: Recent Labs  Lab 03/03/19 1417 03/03/19 1718 03/04/19 0351  NA 137  --  135  K 2.8*  --  3.1*  CL 99  --  102  CO2 27  --  24  GLUCOSE 115*  --   95  BUN 19  --  13  CREATININE 1.34*  --  1.03  CALCIUM 8.1*  --  7.3*  MG  --  1.6* 1.4*  PHOS  --   --  2.2*   GFR: Estimated Creatinine Clearance: 57.8 mL/min (by C-G formula based on SCr of 1.03 mg/dL). Liver Function Tests: Recent Labs  Lab 03/01/19 1112 03/03/19 1417 03/04/19 0351  AST 197* 161* 77*  ALT 96* 183* 118*  ALKPHOS 403* 446* 325*  BILITOT 2.5* 1.9* 1.0  PROT 7.0 6.6 5.3*  ALBUMIN 4.0 3.3* 2.7*   Recent Labs  Lab 03/03/19 1417  LIPASE 27   No results for input(s): AMMONIA in the last 168 hours. Coagulation Profile: No results for input(s): INR, PROTIME in the last 168 hours. Cardiac Enzymes: No results for input(s): CKTOTAL, CKMB, CKMBINDEX, TROPONINI in the last 168 hours. BNP (last 3 results) No results for input(s): PROBNP in the last 8760 hours. HbA1C: No results for input(s): HGBA1C in the last 72 hours. CBG: No results for input(s): GLUCAP in the last 168 hours. Lipid Profile: No results for  input(s): CHOL, HDL, LDLCALC, TRIG, CHOLHDL, LDLDIRECT in the last 72 hours. Thyroid Function Tests: No results for input(s): TSH, T4TOTAL, FREET4, T3FREE, THYROIDAB in the last 72 hours. Anemia Panel: No results for input(s): VITAMINB12, FOLATE, FERRITIN, TIBC, IRON, RETICCTPCT in the last 72 hours. Sepsis Labs: No results for input(s): PROCALCITON, LATICACIDVEN in the last 168 hours.  Recent Results (from the past 240 hour(s))  SARS Coronavirus 2 (Performed in [place] hospital lab)     Status: None   Collection Time: 03/02/19  1:39 PM   Specimen: Nasal Swab  Result Value Ref Range Status   SARS Coronavirus 2 NEGATIVE NEGATIVE Final    Comment: (NOTE) SARS-CoV-2 target nucleic acids are NOT DETECTED. The SARS-CoV-2 RNA is generally detectable in upper and lower respiratory specimens during the acute phase of infection. Negative results do not preclude SARS-CoV-2 infection, do not rule out co-infections with other pathogens, and should not be  used as the sole basis for treatment or other patient management decisions. Negative results must be combined with clinical observations, patient history, and epidemiological information. The expected result is Negative. Fact Sheet for Patients: SugarRoll.be Fact Sheet for Healthcare Providers: https://www.woods-mathews.com/ This test is not yet approved or cleared by the [place] FDA and  has been authorized for detection and/or diagnosis of SARS-CoV-2 by FDA under an Emergency Use Authorization (EUA). This EUA will remain  in effect (meaning this test can be used) for the duration of the COVID-19 declaration under Section 56 4(b)(1) of the Act, 21 U.S.C. section 360bbb-3(b)(1), unless the authorization is terminated or revoked sooner. Performed at [facility], [place] [address], [place], [place] [address]   SARS Coronavirus 2 (CEPHEID - Performed in [place] hospital lab), Hosp Order     Status: None   Collection Time: 03/03/19  4:37 PM   Specimen: Nasopharyngeal Swab  Result Value Ref Range Status   SARS Coronavirus 2 NEGATIVE NEGATIVE Final    Comment: (NOTE) If result is NEGATIVE SARS-CoV-2 target nucleic acids are NOT DETECTED. The SARS-CoV-2 RNA is generally detectable in upper and lower  respiratory specimens during the acute phase of infection. The lowest  concentration of SARS-CoV-2 viral copies this assay can detect is 250  copies / mL. A negative result does not preclude SARS-CoV-2 infection  and should not be used as the sole basis for treatment or other  patient management decisions.  A negative result may occur with  improper specimen collection / handling, submission of specimen other  than nasopharyngeal swab, presence of viral mutation(s) within the  areas targeted by this assay, and inadequate number of viral copies  (<250 copies / mL). A negative result must be combined with clinical  observations, patient  history, and epidemiological information. If result is POSITIVE SARS-CoV-2 target nucleic acids are DETECTED. The SARS-CoV-2 RNA is generally detectable in upper and lower  respiratory specimens dur ing the acute phase of infection.  Positive  results are indicative of active infection with SARS-CoV-2.  Clinical  correlation with patient history and other diagnostic information is  necessary to determine patient infection status.  Positive results do  not rule out bacterial infection or co-infection with other viruses. If result is PRESUMPTIVE POSTIVE SARS-CoV-2 nucleic acids MAY BE PRESENT.   A presumptive positive result was obtained on the submitted specimen  and confirmed on repeat testing.  While 2019 novel coronavirus  (SARS-CoV-2) nucleic acids may be present in the submitted sample  additional confirmatory testing may be necessary for  epidemiological  and / or clinical management purposes  to differentiate between  SARS-CoV-2 and other Sarbecovirus currently known to infect humans.  If clinically indicated additional testing with an alternate test  methodology [phone]) is advised. The SARS-CoV-2 RNA is generally  detectable in upper and lower respiratory sp ecimens during the acute  phase of infection. The expected result is Negative. Fact Sheet for Patients:  StrictlyIdeas.no Fact Sheet for Healthcare Providers: BankingDealers.co.za This test is not yet approved or cleared by the [place] FDA and has been authorized for detection and/or diagnosis of SARS-CoV-2 by FDA under an Emergency Use Authorization (EUA).  This EUA will remain in effect (meaning this test can be used) for the duration of the COVID-19 declaration under Section 564(b)(1) of the Act, 21 U.S.C. section 360bbb-3(b)(1), unless the authorization is terminated or revoked sooner. Performed at [facility], [place] [address], [place],  [place] [address]   Culture, blood (routine x 2)     Status: None (Preliminary result)   Collection Time: 03/03/19  4:37 PM   Specimen: BLOOD RIGHT FOREARM  Result Value Ref Range Status   Specimen Description   Final    BLOOD RIGHT FOREARM Performed at [facility], [place] [address], [place], [place] [address]    Special Requests   Final    BOTTLES DRAWN AEROBIC AND ANAEROBIC Blood Culture adequate volume Performed at [place] [address], [place], [place] [address]    Culture PENDING  Incomplete   Report Status PENDING  Incomplete      Radiology Studies: Dg Ercp  Result Date: 03/04/2019 CLINICAL DATA:  Choledocholithiasis. EXAM: ERCP TECHNIQUE: Multiple spot images obtained with the fluoroscopic device and submitted for interpretation post-procedure. COMPARISON:  None. FINDINGS: Imaging during ERCP demonstrates cannulation of the common bile duct with cholangiogram demonstrating diffuse dilatation of opacified bile ducts and suggestion of multiple filling defects in the common bile duct. There is evidence of prior cholecystectomy. Balloon sweep maneuver was performed to extract calculi. IMPRESSION: Choledocholithiasis and biliary dilatation. Balloon sweep maneuver was performed to extract calculi. These images were submitted for radiologic interpretation only. Please see the procedural report for the amount of contrast and the fluoroscopy time utilized. Electronically Signed   By: Aletta Edouard M.D.   On: 03/04/2019 09:25      LOS: 1 day   Time spent: More than 50% of that time was spent in counseling and/or coordination of care.  Antonieta Pert, MD [facility]  03/04/2019, 10:21 AM

## 2019-03-04 NOTE — Progress Notes (Signed)
Stool for C. Diff. sent to lab by Lannie Fields, RN on night shift. Lab rejected the specimen due to it being a formed stool. Donne Hazel, RN

## 2019-03-05 LAB — COMPREHENSIVE METABOLIC PANEL
ALT: 88 U/L — ABNORMAL HIGH (ref 0–44)
AST: 42 U/L — ABNORMAL HIGH (ref 15–41)
Albumin: 2.6 g/dL — ABNORMAL LOW (ref 3.5–5.0)
Alkaline Phosphatase: 270 U/L — ABNORMAL HIGH (ref 38–126)
Anion gap: 9 (ref 5–15)
BUN: 13 mg/dL (ref 8–23)
CO2: 25 mmol/L (ref 22–32)
Calcium: 8.1 mg/dL — ABNORMAL LOW (ref 8.9–10.3)
Chloride: 108 mmol/L (ref 98–111)
Creatinine, Ser: 0.9 mg/dL (ref 0.61–1.24)
GFR calc Af Amer: >60 mL/min (ref >60)
GFR calc non Af Amer: >60 mL/min (ref >60)
Glucose, Bld: 124 mg/dL — ABNORMAL HIGH (ref 70–99)
Potassium: 4.2 mmol/L (ref 3.5–5.1)
Sodium: 142 mmol/L (ref 135–145)
Total Bilirubin: 0.7 mg/dL (ref 0.3–1.2)
Total Protein: 5.6 g/dL — ABNORMAL LOW (ref 6.5–8.1)

## 2019-03-05 LAB — CBC
HCT: 36.8 % — ABNORMAL LOW (ref 39.0–52.0)
Hemoglobin: 11.8 g/dL — ABNORMAL LOW (ref 13.0–17.0)
MCH: 29.6 pg (ref 26.0–34.0)
MCHC: 32.1 g/dL (ref 30.0–36.0)
MCV: 92.5 fL (ref 80.0–100.0)
Platelets: 148 10*3/uL — ABNORMAL LOW (ref 150–400)
RBC: 3.98 MIL/uL — ABNORMAL LOW (ref 4.22–5.81)
RDW: 13 % (ref 11.5–15.5)
WBC: 4.7 10*3/uL (ref 4.0–10.5)
nRBC: 0 % (ref 0.0–0.2)

## 2019-03-05 LAB — LIPASE, BLOOD: Lipase: 25 U/L (ref 11–51)

## 2019-03-05 MED ORDER — VANCOMYCIN HCL 125 MG PO CAPS: 125.0000 mg | ORAL_CAPSULE | Freq: Four times a day (QID) | ORAL | 0 refills | Status: DC

## 2019-03-05 NOTE — Progress Notes (Signed)
Progress Note   Subjective  Feeling well, no abdominal pain Hungry but only got liquids for lunch; regular diet ordered but he is yet to receive No bowel movement today, thus no further diarrhea No fevers Mild intermittent lightheadedness but not exactly vertigo; feels better when he is actually up and moving, he has walked around the unit today   Objective  Vital signs in last 24 hours: Temp:  [97.5 F (36.4 C)-97.9 F (36.6 C)] 97.5 F (36.4 C) (07/25 0546) Pulse Rate:  [46-58] 52 (07/25 0546) Resp:  [17-18] 18 (07/25 0546) BP: (151-169)/(81-87) 151/83 (07/25 0546) SpO2:  [94 %-96 %] 96 % (07/25 0546) Last BM Date: 03/04/19  General: Alert, well-developed, in NAD Heart:  Regular rate and rhythm; no murmurs Chest: Clear to ascultation bilaterally Abdomen:  Soft, nontender and nondistended. Normal bowel sounds, without guarding, and without rebound.   Extremities:  Without edema. Neurologic:  Alert and  oriented x4; grossly normal neurologically. Psych:  Alert and cooperative. Normal mood and affect.  Intake/Output from previous day: 07/24 0701 - 07/25 0700 In: 1913 [P.O.:800; I.V.:763; IV Piggyback:350] Out: 2100 [Urine:2100] Intake/Output this shift: Total I/O In: 240 [P.O.:240] Out: -   Lab Results: Recent Labs    03/03/19 1417 03/04/19 0351 03/05/19 0428  WBC 6.8 5.1 4.7  HGB 12.5* 10.7* 11.8*  HCT 38.5* 33.5* 36.8*  PLT 146* 123* 148*   BMET Recent Labs    03/04/19 0351 03/04/19 1445 03/05/19 0428  NA 135 139 142  K 3.1* 4.3 4.2  CL 102 105 108  CO2 24 26 25   GLUCOSE 95 164* 124*  BUN 13 11 13   CREATININE 1.03 1.02 0.90  CALCIUM 7.3* 7.9* 8.1*   LFT Recent Labs    03/05/19 0428  PROT 5.6*  ALBUMIN 2.6*  AST 42*  ALT 88*  ALKPHOS 270*  BILITOT 0.7   PT/INR No results for input(s): LABPROT, INR in the last 72 hours. Hepatitis Panel No results for input(s): HEPBSAG, HCVAB, HEPAIGM, HEPBIGM in the last 72 hours.  Studies/Results:  Dg Ercp  Result Date: 03/04/2019 CLINICAL DATA:  Choledocholithiasis. EXAM: ERCP TECHNIQUE: Multiple spot images obtained with the fluoroscopic device and submitted for interpretation post-procedure. COMPARISON:  None. FINDINGS: Imaging during ERCP demonstrates cannulation of the common bile duct with cholangiogram demonstrating diffuse dilatation of opacified bile ducts and suggestion of multiple filling defects in the common bile duct. There is evidence of prior cholecystectomy. Balloon sweep maneuver was performed to extract calculi. IMPRESSION: Choledocholithiasis and biliary dilatation. Balloon sweep maneuver was performed to extract calculi. These images were submitted for radiologic interpretation only. Please see the procedural report for the amount of contrast and the fluoroscopy time utilized. Electronically Signed   By: Aletta Edouard M.D.   On: 03/04/2019 09:25      Assessment & Recommendations  85 year old with history of choledocholithiasis status post ERCP yesterday with stone extraction, history of C. difficile colitis, CAD, hypertension, BPH  1.  Choledocholithiasis --status post ERCP yesterday.  No evidence of postprocedural complication.  Improving liver enzymes.  Per Dr. Carlean Purl no need for antibiotics, thus I have stopped ceftriaxone.  Also wish to avoid antibiotics given history of C. Difficile. --We will arrange office follow-up, likely repeat liver enzymes in about a week --Okay for discharge from GI perspective  2.  History of C. Difficile --empirically on vancomycin for short period, now stopped.  Antibiotics off.  Patient will monitor for diarrhea       LOS: 2  days   Lajuan Lines Delora Gravatt  03/05/2019, 1:40 PM

## 2019-03-05 NOTE — Discharge Summary (Signed)
Physician Discharge Summary  [name] [name] QMV:[id number] DOB: [birth year] DOA: 03/03/2019  PCP: [name], MD  Admit date: 03/03/2019 Discharge date: 03/05/2019  Admitted From: home Disposition:  home  Recommendations for Outpatient Follow-up:  1. Follow up with PCP in 1-2 weeks 2. Please obtain BMP/CBC in one week 3. Please follow up on the following pending results:  Home Health: no  Equipment/Devices:none  Discharge Condition: Stable CODE STATUS: FULL Diet recommendation: Heart Healthy  Brief/Interim Summary:  85 y.o.malewith PMH of hypertension, BPH, C. difficile colitis x2 with recurrent C. difficile colitis a month ago,  CAD-stable,HLD, history of cholelithiasis s./p cholecystectomy/history of choledocholithiasis s/p ERCP admitted with colicky abdominal pain for 5 days- pain is in the right upper quadrant similar to his previous pain when he had ERCP for stone 3 years ago. He had C. difficile about a year ago was treated with 2 weeks of vancomycin, he had recurrence a month ago was treated with 2 weeks of vancomycin. Patient tried some hydrocodone at home with no relief. He states 2 episodes of temperature on Monday it was up to 101. Seen by gastroenterology 2 days ago, started on ciprofloxacin and plan for ERCP 7/24.  ED Course:Hemodynamically stable.On room air. Blood pressures are adequate.WBC count is normal. Potassium is 2.8. Mildly elevated bilirubin with mild elevated transaminases. Creatinine 1.34, slightly abnormal.Afebrile Preprocedural COVID-19 03/02/2019 was negative. EKG shows prolonged QT with no definite T wave probably due to hypokalemia. Case discussed with GI, plan for procedure 7/24 and admission was requested.  Patient was admitted underwent ERCP, was monitored another day and did well.  LFTs and lipase normal.Afebrile.Discussed with on-call GI and okay to discharge home.  Assessment & Plan:   Right upper quadrant abdominal pain suspecting  biliary colic possible cholangitis with choledocholithiasis/biliary obstruction, given abnormal LFTs: Status post ERCP: Choledocholithiasis and biliary dilatation, balloon sweep maneuver was performed to extract calculi.  Repeat LFTs next has significantly improved and lipase stable at 25. discussed with gastr lisinopril discharge home advised to continue his home antibiotics to complete 7 days course.  Fever at home in the setting of, #1 suspecting cholangitis on ceftriaxone here.Was on Cipro for 2 days PTA. Discussed w Dr Hilarie Fredrickson and stopping antibiotics and no need further.  Blood culture unremarkable here and no fever and no leukocytosis.   Diarrhea at home, with recent history of C. Difficile. Stool was formed here.  Stopping antibiotics does not need vancomycin at this time.   CAD: Stable,no chest pain.  Resume aspirin and statin.   Essential MWU:XLKGMWNU is stable.  Resume home meds upon discharge  AKI: Creatinine improved with IV hydration.  Hypokalemia/hypophosphatemia/hypomagnesemia:  Improved after replacement.    Prolonged QTC: likely from electrolyte imbalance.  Repleted electrolytes. repeat EKG improved to 486.   Discharge Diagnoses:  Principal Problem:   Cholangitis due to bile duct calculus with obstruction Active Problems:   CAD (coronary artery disease)   HTN (hypertension)   Hyperlipidemia   C. difficile colitis   Hypokalemia due to excessive gastrointestinal loss of potassium    Discharge Instructions  Discharge Instructions    Diet - low sodium heart healthy   Complete by: As directed    Discharge instructions   Complete by: As directed    Please call call MD or return to ER for similar or worsening recurring problem that brought you to hospital or if any fever,nausea/vomiting,abdominal pain, uncontrolled pain, chest pain,  shortness of breath or any other alarming symptoms.  Please follow-up your  doctor as instructed in a week time and call the office  for appointment.  Please follow Up with GI for repeat LFTs in 7/29 as instructed  Please avoid alcohol, smoking, or any other illicit substance and maintain healthy habits including taking your regular medications as prescribed.  You were cared for by a hospitalist during your hospital stay. If you have any questions about your discharge medications or the care you received while you were in the hospital after you are discharged, you can call the unit and ask to speak with the hospitalist on call if the hospitalist that took care of you is not available.  Once you are discharged, your primary care physician will handle any further medical issues. Please note that NO REFILLS for any discharge medications will be authorized once you are discharged, as it is imperative that you return to your primary care physician (or establish a relationship with a primary care physician if you do not have one) for your aftercare needs so that they can reassess your need for medications and monitor your lab values   Increase activity slowly   Complete by: As directed      Allergies as of 03/05/2019   No Known Allergies     Medication List    STOP taking these medications   ciprofloxacin 500 MG tablet Commonly known as: CIPRO     TAKE these medications   acetaminophen 500 MG tablet Commonly known as: TYLENOL Take 500 mg by mouth every 6 (six) hours as needed for moderate pain.   amLODipine 5 MG tablet Commonly known as: NORVASC take 1 tablet by mouth once daily every evening What changed:   how much to take  how to take this  when to take this  additional instructions   aspirin 81 MG tablet Take 81 mg by mouth daily with supper.   b complex vitamins tablet Take 1 tablet by mouth daily.   BENEFIBER PO Take 1 Dose by mouth daily.   dutasteride 0.5 MG capsule Commonly known as: AVODART Take 0.5 mg by mouth every evening.   famotidine 20 MG tablet Commonly known as: PEPCID Take 20 mg  by mouth daily as needed for heartburn or indigestion.   Flomax 0.4 MG Caps capsule Generic drug: tamsulosin Take 0.4 mg by mouth every evening.   gabapentin 300 MG capsule Commonly known as: NEURONTIN Take 300 mg by mouth 4 (four) times daily.   GAS-X PO Take 1 tablet by mouth daily as needed (gas).   GLUCOSAMINE-CHONDROITIN PO Take 1 tablet by mouth 2 (two) times a day.   HYDROcodone-acetaminophen 5-325 MG tablet Commonly known as: NORCO/VICODIN Take 1 tablet by mouth every 6 (six) hours as needed for moderate pain.   Myrbetriq 50 MG Tb24 tablet Generic drug: mirabegron ER Take 1 tablet by mouth daily with supper.   rosuvastatin 20 MG tablet Commonly known as: CRESTOR Take 1 tablet (20 mg total) by mouth daily. What changed: when to take this   SYSTANE OP Place 1 drop into both eyes daily as needed (dry eyes).   valsartan-hydrochlorothiazide 320-12.5 MG tablet Commonly known as: DIOVAN-HCT Take 1 tablet by mouth every morning.   Vitamin D 1000 units capsule Take 1,000 Units by mouth daily.      Follow-up Information    [name], MD On 03/09/2019.   Specialty: Gastroenterology Why: Go to lab in basement and have blood drawn. will arrange office follow-up after se lab results Contact information: [address] [facility]  [place] [address] [phone]        [name], MD Follow up in 1 week(s).   Specialty: Family Medicine Contact information: [address] [place] [address] [phone]          No Known Allergies  Procedures/Studies: Dg Ercp  Result Date: 03/04/2019 CLINICAL DATA:  Choledocholithiasis. EXAM: ERCP TECHNIQUE: Multiple spot images obtained with the fluoroscopic device and submitted for interpretation post-procedure. COMPARISON:  None. FINDINGS: Imaging during ERCP demonstrates cannulation of the common bile duct with cholangiogram demonstrating diffuse dilatation of opacified bile ducts and suggestion of  multiple filling defects in the common bile duct. There is evidence of prior cholecystectomy. Balloon sweep maneuver was performed to extract calculi. IMPRESSION: Choledocholithiasis and biliary dilatation. Balloon sweep maneuver was performed to extract calculi. These images were submitted for radiologic interpretation only. Please see the procedural report for the amount of contrast and the fluoroscopy time utilized. Electronically Signed   By: Aletta Edouard M.D.   On: 03/04/2019 09:25   Subjective: restign well. No cp,sob or abd pain or fever. Wants to go home  Discharge Exam: Vitals:   03/04/19 2130 03/05/19 0546  BP:  (!) 151/83  Pulse: (!) 58 (!) 52  Resp:  18  Temp:  (!) 97.5 F (36.4 C)  SpO2:  96%   Vitals:   03/04/19 1343 03/04/19 2121 03/04/19 2130 03/05/19 0546  BP: (!) 169/81 (!) 160/87  (!) 151/83  Pulse: (!) 56 (!) 46 (!) 58 (!) 52  Resp: 17 18  18   Temp: 97.9 F (36.6 C) (!) 97.5 F (36.4 C)  (!) 97.5 F (36.4 C)  TempSrc: Oral Oral  Oral  SpO2: 94% 95%  96%  Weight:      Height:        General: Pt is alert, awake, not in acute distress Cardiovascular: RRR, S1/S2 +, no rubs, no gallops Respiratory: CTA bilaterally, no wheezing, no rhonchi Abdominal: Soft, NT, ND, bowel sounds + Extremities: no edema, no cyanosis   The results of significant diagnostics from this hospitalization (including imaging, microbiology, ancillary and laboratory) are listed below for reference.     Microbiology: Recent Results (from the past 240 hour(s))  SARS Coronavirus 2 (Performed in [facility] hospital lab)     Status: None   Collection Time: 03/02/19  1:39 PM   Specimen: Nasal Swab  Result Value Ref Range Status   SARS Coronavirus 2 NEGATIVE NEGATIVE Final    Comment: (NOTE) SARS-CoV-2 target nucleic acids are NOT DETECTED. The SARS-CoV-2 RNA is generally detectable in upper and lower respiratory specimens during the acute phase of infection. Negative results do not  preclude SARS-CoV-2 infection, do not rule out co-infections with other pathogens, and should not be used as the sole basis for treatment or other patient management decisions. Negative results must be combined with clinical observations, patient history, and epidemiological information. The expected result is Negative. Fact Sheet for Patients: SugarRoll.be Fact Sheet for Healthcare Providers: https://www.woods-mathews.com/ This test is not yet approved or cleared by the [place] FDA and  has been authorized for detection and/or diagnosis of SARS-CoV-2 by FDA under an Emergency Use Authorization (EUA). This EUA will remain  in effect (meaning this test can be used) for the duration of the COVID-19 declaration under Section 56 4(b)(1) of the Act, 21 U.S.C. section 360bbb-3(b)(1), unless the authorization is terminated or revoked sooner. Performed at [facility], [place] [address], [place], [place] [address]   Urine culture  Status: None   Collection Time: 03/03/19  3:18 PM   Specimen: Urine, Random  Result Value Ref Range Status   Specimen Description   Final    URINE, RANDOM Performed at [facility] [address], [place], [place] [address]    Special Requests   Final    NONE Performed at [facility], [place] [address], [place], [place] [address]    Culture   Final    NO GROWTH Performed at [facility], [place] [address], [place], [place] [address]    Report Status 03/04/2019 FINAL  Final  SARS Coronavirus 2 (CEPHEID - Performed in [facility] hospital lab), Hosp Order     Status: None   Collection Time: 03/03/19  4:37 PM   Specimen: Nasopharyngeal Swab  Result Value Ref Range Status   SARS Coronavirus 2 NEGATIVE NEGATIVE Final    Comment: (NOTE) If result is NEGATIVE SARS-CoV-2 target nucleic acids are NOT DETECTED. The SARS-CoV-2 RNA is generally detectable in upper and lower   respiratory specimens during the acute phase of infection. The lowest  concentration of SARS-CoV-2 viral copies this assay can detect is 250  copies / mL. A negative result does not preclude SARS-CoV-2 infection  and should not be used as the sole basis for treatment or other  patient management decisions.  A negative result may occur with  improper specimen collection / handling, submission of specimen other  than nasopharyngeal swab, presence of viral mutation(s) within the  areas targeted by this assay, and inadequate number of viral copies  (<250 copies / mL). A negative result must be combined with clinical  observations, patient history, and epidemiological information. If result is POSITIVE SARS-CoV-2 target nucleic acids are DETECTED. The SARS-CoV-2 RNA is generally detectable in upper and lower  respiratory specimens dur ing the acute phase of infection.  Positive  results are indicative of active infection with SARS-CoV-2.  Clinical  correlation with patient history and other diagnostic information is  necessary to determine patient infection status.  Positive results do  not rule out bacterial infection or co-infection with other viruses. If result is PRESUMPTIVE POSTIVE SARS-CoV-2 nucleic acids MAY BE PRESENT.   A presumptive positive result was obtained on the submitted specimen  and confirmed on repeat testing.  While 2019 novel coronavirus  (SARS-CoV-2) nucleic acids may be present in the submitted sample  additional confirmatory testing may be necessary for epidemiological  and / or clinical management purposes  to differentiate between  SARS-CoV-2 and other Sarbecovirus currently known to infect humans.  If clinically indicated additional testing with an alternate test  methodology [phone]) is advised. The SARS-CoV-2 RNA is generally  detectable in upper and lower respiratory sp ecimens during the acute  phase of infection. The expected result is Negative. Fact  Sheet for Patients:  StrictlyIdeas.no Fact Sheet for Healthcare Providers: BankingDealers.co.za This test is not yet approved or cleared by the [place] FDA and has been authorized for detection and/or diagnosis of SARS-CoV-2 by FDA under an Emergency Use Authorization (EUA).  This EUA will remain in effect (meaning this test can be used) for the duration of the COVID-19 declaration under Section 564(b)(1) of the Act, 21 U.S.C. section 360bbb-3(b)(1), unless the authorization is terminated or revoked sooner. Performed at [facility], [place] [address], [place], [place] [address]   Culture, blood (routine x 2)     Status: None (Preliminary result)   Collection Time: 03/03/19  4:37 PM  Specimen: BLOOD  Result Value Ref Range Status   Specimen Description BLOOD RIGHT ANTECUBITAL  Final   Special Requests   Final    BOTTLES DRAWN AEROBIC AND ANAEROBIC Blood Culture adequate volume   Culture   Final    NO GROWTH 2 DAYS Performed at [facility], [address] [place], [place] [address]    Report Status PENDING  Incomplete  Culture, blood (routine x 2)     Status: None (Preliminary result)   Collection Time: 03/03/19  4:37 PM   Specimen: BLOOD RIGHT FOREARM  Result Value Ref Range Status   Specimen Description   Final    BLOOD RIGHT FOREARM Performed at [facility], [place] [address], [place], [place] [address]    Special Requests   Final    BOTTLES DRAWN AEROBIC AND ANAEROBIC Blood Culture adequate volume Performed at [place] [address] [place], [place] [address]    Culture   Final    NO GROWTH 2 DAYS Performed at [facility] [address] [place],  [address]    Report Status PENDING  Incomplete     Labs: BNP (last 3 results) No results for input(s): BNP in the last 8760 hours. Basic Metabolic Panel: Recent Labs  Lab 03/03/19 1417 03/03/19 1718  03/04/19 0351 03/04/19 1445 03/05/19 0428  NA 137  --  135 139 142  K 2.8*  --  3.1* 4.3 4.2  CL 99  --  102 105 108  CO2 27  --  24 26 25   GLUCOSE 115*  --  95 164* 124*  BUN 19  --  13 11 13   CREATININE 1.34*  --  1.03 1.02 0.90  CALCIUM 8.1*  --  7.3* 7.9* 8.1*  MG  --  1.6* 1.4* 1.8  --   PHOS  --   --  2.2*  --   --    Liver Function Tests: Recent Labs  Lab 03/01/19 1112 03/03/19 1417 03/04/19 0351 03/05/19 0428  AST 197* 161* 77* 42*  ALT 96* 183* 118* 88*  ALKPHOS 403* 446* 325* 270*  BILITOT 2.5* 1.9* 1.0 0.7  PROT 7.0 6.6 5.3* 5.6*  ALBUMIN 4.0 3.3* 2.7* 2.6*   Recent Labs  Lab 03/03/19 1417 03/05/19 0428  LIPASE 27 25   No results for input(s): AMMONIA in the last 168 hours. CBC: Recent Labs  Lab 03/03/19 1417 03/04/19 0351 03/05/19 0428  WBC 6.8 5.1 4.7  NEUTROABS 5.8 3.9  --   HGB 12.5* 10.7* 11.8*  HCT 38.5* 33.5* 36.8*  MCV 92.3 92.3 92.5  PLT 146* 123* 148*   Cardiac Enzymes: No results for input(s): CKTOTAL, CKMB, CKMBINDEX, TROPONINI in the last 168 hours. BNP: Invalid input(s): POCBNP CBG: No results for input(s): GLUCAP in the last 168 hours. D-Dimer No results for input(s): DDIMER in the last 72 hours. Hgb A1c No results for input(s): HGBA1C in the last 72 hours. Lipid Profile No results for input(s): CHOL, HDL, LDLCALC, TRIG, CHOLHDL, LDLDIRECT in the last 72 hours. Thyroid function studies No results for input(s): TSH, T4TOTAL, T3FREE, THYROIDAB in the last 72 hours.  Invalid input(s): FREET3 Anemia work up No results for input(s): VITAMINB12, FOLATE, FERRITIN, TIBC, IRON, RETICCTPCT in the last 72 hours. Urinalysis    Component Value Date/Time   COLORURINE AMBER (A) 03/03/2019 1519   APPEARANCEUR HAZY (A) 03/03/2019 1519   LABSPEC 1.021 03/03/2019 1519   PHURINE 5.0 03/03/2019 1519  GLUCOSEU NEGATIVE 03/03/2019 1519   HGBUR NEGATIVE 03/03/2019 1519   BILIRUBINUR NEGATIVE 03/03/2019 1519   BILIRUBINUR neg 12/24/2012  1324   KETONESUR 5 (A) 03/03/2019 1519   PROTEINUR 100 (A) 03/03/2019 1519   UROBILINOGEN 1.0 10/03/2013 2148   NITRITE NEGATIVE 03/03/2019 1519   LEUKOCYTESUR NEGATIVE 03/03/2019 1519   Sepsis Labs Invalid input(s): PROCALCITONIN,  WBC,  LACTICIDVEN Microbiology Recent Results (from the past 240 hour(s))  SARS Coronavirus 2 (Performed in [facility] hospital lab)     Status: None   Collection Time: 03/02/19  1:39 PM   Specimen: Nasal Swab  Result Value Ref Range Status   SARS Coronavirus 2 NEGATIVE NEGATIVE Final    Comment: (NOTE) SARS-CoV-2 target nucleic acids are NOT DETECTED. The SARS-CoV-2 RNA is generally detectable in upper and lower respiratory specimens during the acute phase of infection. Negative results do not preclude SARS-CoV-2 infection, do not rule out co-infections with other pathogens, and should not be used as the sole basis for treatment or other patient management decisions. Negative results must be combined with clinical observations, patient history, and epidemiological information. The expected result is Negative. Fact Sheet for Patients: SugarRoll.be Fact Sheet for Healthcare Providers: https://www.woods-mathews.com/ This test is not yet approved or cleared by the [place] FDA and  has been authorized for detection and/or diagnosis of SARS-CoV-2 by FDA under an Emergency Use Authorization (EUA). This EUA will remain  in effect (meaning this test can be used) for the duration of the COVID-19 declaration under Section 56 4(b)(1) of the Act, 21 U.S.C. section 360bbb-3(b)(1), unless the authorization is terminated or revoked sooner. Performed at [facility], [place] [address], [place], [place] [address]   Urine culture     Status: None   Collection Time: 03/03/19  3:18 PM   Specimen: Urine, Random  Result Value Ref Range Status   Specimen Description   Final    URINE, RANDOM Performed at [facility] [address] [place], [place] [address]    Special Requests   Final    NONE Performed at [facility], [place] [address], [place], [place] [address]    Culture   Final    NO GROWTH Performed at [facility], [place] [address], [place], [place] [address]    Report Status 03/04/2019 FINAL  Final  SARS Coronavirus 2 (CEPHEID - Performed in [facility] hospital lab), Hosp Order     Status: None   Collection Time: 03/03/19  4:37 PM   Specimen: Nasopharyngeal Swab  Result Value Ref Range Status   SARS Coronavirus 2 NEGATIVE NEGATIVE Final    Comment: (NOTE) If result is NEGATIVE SARS-CoV-2 target nucleic acids are NOT DETECTED. The SARS-CoV-2 RNA is generally detectable in upper and lower  respiratory specimens during the acute phase of infection. The lowest  concentration of SARS-CoV-2 viral copies this assay can detect is 250  copies / mL. A negative result does not preclude SARS-CoV-2 infection  and should not be used as the sole basis for treatment or other  patient management decisions.  A negative result may occur with  improper specimen collection / handling, submission of specimen other  than nasopharyngeal swab, presence of viral mutation(s) within the  areas targeted by this assay, and inadequate number of viral copies  (<250 copies / mL). A negative result must be combined with clinical  observations, patient history, and epidemiological information. If result is POSITIVE SARS-CoV-2 target nucleic acids are DETECTED.  The SARS-CoV-2 RNA is generally detectable in upper and lower  respiratory specimens dur ing the acute phase of infection.  Positive  results are indicative of active infection with SARS-CoV-2.  Clinical  correlation with patient history and other diagnostic information is  necessary to determine patient infection status.  Positive results do  not rule out bacterial infection or co-infection with other viruses. If  result is PRESUMPTIVE POSTIVE SARS-CoV-2 nucleic acids MAY BE PRESENT.   A presumptive positive result was obtained on the submitted specimen  and confirmed on repeat testing.  While 2019 novel coronavirus  (SARS-CoV-2) nucleic acids may be present in the submitted sample  additional confirmatory testing may be necessary for epidemiological  and / or clinical management purposes  to differentiate between  SARS-CoV-2 and other Sarbecovirus currently known to infect humans.  If clinically indicated additional testing with an alternate test  methodology [phone]) is advised. The SARS-CoV-2 RNA is generally  detectable in upper and lower respiratory sp ecimens during the acute  phase of infection. The expected result is Negative. Fact Sheet for Patients:  StrictlyIdeas.no Fact Sheet for Healthcare Providers: BankingDealers.co.za This test is not yet approved or cleared by the [place] FDA and has been authorized for detection and/or diagnosis of SARS-CoV-2 by FDA under an Emergency Use Authorization (EUA).  This EUA will remain in effect (meaning this test can be used) for the duration of the COVID-19 declaration under Section 564(b)(1) of the Act, 21 U.S.C. section 360bbb-3(b)(1), unless the authorization is terminated or revoked sooner. Performed at [facility], [place] [address] [place], [place] [address]   Culture, blood (routine x 2)     Status: None (Preliminary result)   Collection Time: 03/03/19  4:37 PM   Specimen: BLOOD  Result Value Ref Range Status   Specimen Description BLOOD RIGHT ANTECUBITAL  Final   Special Requests   Final    BOTTLES DRAWN AEROBIC AND ANAEROBIC Blood Culture adequate volume   Culture   Final    NO GROWTH 2 DAYS Performed at [facility], [place] [address], [place], [place] [address]    Report Status PENDING  Incomplete  Culture, blood (routine x 2)     Status: None (Preliminary  result)   Collection Time: 03/03/19  4:37 PM   Specimen: BLOOD RIGHT FOREARM  Result Value Ref Range Status   Specimen Description   Final    BLOOD RIGHT FOREARM Performed at [facility], [place] [address] [place], [place] [address]    Special Requests   Final    BOTTLES DRAWN AEROBIC AND ANAEROBIC Blood Culture adequate volume Performed at [facility] [address] [place], [place] [address]    Culture   Final    NO GROWTH 2 DAYS Performed at [facility] [address] [place],  [address]    Report Status PENDING  Incomplete     Time coordinating discharge: 25 minutes  SIGNED:   Antonieta Pert, MD  [facility] 03/05/2019, 1:57 PM  If 7PM-7AM, please contact night-coverage [email or web address]

## 2019-03-05 NOTE — Progress Notes (Signed)
Both PIV removed without difficulty. Discharge instructions reviewed with pt and printed instructions provided. Pt verbalizes understanding and denies any questions or concerns

## 2019-03-06 ENCOUNTER — Encounter (HOSPITAL_COMMUNITY): Payer: Self-pay | Admitting: Internal Medicine

## 2019-03-08 LAB — CULTURE, BLOOD (ROUTINE X 2)
Culture: NO GROWTH
Culture: NO GROWTH
Special Requests: ADEQUATE
Special Requests: ADEQUATE

## 2019-03-09 ENCOUNTER — Other Ambulatory Visit (INDEPENDENT_AMBULATORY_CARE_PROVIDER_SITE_OTHER): Payer: Medicare Other

## 2019-03-09 DIAGNOSIS — K8031 Calculus of bile duct with cholangitis, unspecified, with obstruction: Secondary | ICD-10-CM

## 2019-03-09 LAB — HEPATIC FUNCTION PANEL
ALT: 45 U/L (ref 0–53)
AST: 33 U/L (ref 0–37)
Albumin: 3.6 g/dL (ref 3.5–5.2)
Alkaline Phosphatase: 177 U/L — ABNORMAL HIGH (ref 39–117)
Bilirubin, Direct: 0.2 mg/dL (ref 0.0–0.3)
Total Bilirubin: 0.8 mg/dL (ref 0.2–1.2)
Total Protein: 6.6 g/dL (ref 6.0–8.3)

## 2019-03-11 ENCOUNTER — Telehealth: Payer: Self-pay

## 2019-03-11 NOTE — Telephone Encounter (Signed)
-----   Message from [name], MD sent at 03/10/2019  9:11 AM EDT ----- Regarding: labs Let him know LFT almost normal  Please get  a sx update and set up a telehealth visit next available

## 2019-03-11 NOTE — Telephone Encounter (Signed)
Patient notified of the results  He has no further pain and is doing well He has been having some 'lightheadedness" this was present prior to the procedure.  He has follow up with his PCP next week He has telehelath visit on 04/06/19 11:10

## 2019-03-15 ENCOUNTER — Ambulatory Visit: Payer: Medicare Other | Admitting: Family Medicine

## 2019-03-15 ENCOUNTER — Encounter: Payer: Self-pay | Admitting: Family Medicine

## 2019-03-15 VITALS — BP 100/70 | HR 56 | Temp 98.9°F | Wt 181.0 lb

## 2019-03-15 DIAGNOSIS — I493 Ventricular premature depolarization: Secondary | ICD-10-CM | POA: Diagnosis not present

## 2019-03-15 DIAGNOSIS — H259 Unspecified age-related cataract: Secondary | ICD-10-CM

## 2019-03-15 DIAGNOSIS — K8031 Calculus of bile duct with cholangitis, unspecified, with obstruction: Secondary | ICD-10-CM | POA: Diagnosis not present

## 2019-03-15 DIAGNOSIS — N179 Acute kidney failure, unspecified: Secondary | ICD-10-CM

## 2019-03-15 DIAGNOSIS — I491 Atrial premature depolarization: Secondary | ICD-10-CM | POA: Diagnosis not present

## 2019-03-15 DIAGNOSIS — I498 Other specified cardiac arrhythmias: Secondary | ICD-10-CM | POA: Diagnosis not present

## 2019-03-15 DIAGNOSIS — R42 Dizziness and giddiness: Secondary | ICD-10-CM

## 2019-03-15 DIAGNOSIS — E876 Hypokalemia: Secondary | ICD-10-CM | POA: Diagnosis not present

## 2019-03-15 NOTE — Progress Notes (Signed)
   Subjective:    Patient ID: [name] [name], male    DOB: 1930-04-03, 85 y.o.   MRN: [id number]  HPI He is here for follow-up after recent hospitalization for evaluation and treatment of what turned out to be cholangitis.  He has a previous history of cholecystectomy but started having difficulty again and was found to have stones that needed to be removed.  He had some difficulty with acute kidney injury as well as hypokalemia.  He is doing much better with this but is now having difficulty with dizziness has been going on for quite some time.  He has been checking his blood pressure and notes that he has had some readings in the 90/50 range. He is also had some difficulty with neck pain and has had some dry needling in the past which was successful.  He is also scheduled for laser surgery for right cataract.  Review of Systems     Objective:   Physical Exam Alert and in no distress.  Cardiac exam shows an irregular rhythm with 2/6 systolic murmur.  Lungs are clear to auscultation.  Blood pressure is recorded.  EKG does show an irregular rhythm with probable wandering atrial pacer.       Assessment & Plan:  Dizziness - Plan: I will have him stop his amlodipine.  PAC (premature atrial contraction) - Plan: EKG 12-Lead,  PVC's (premature ventricular contractions) - Plan: EKG 12-Lead,  Wandering pacemaker - Plan: EKG 12-Lead,  AKI (acute kidney injury) ([place]) - Plan: Comprehensive metabolic panel,  Cholangitis due to bile duct calculus with obstruction - Plan: CBC with Differential/Platelet, Comprehensive metabolic panel,  Hypokalemia due to excessive gastrointestinal loss of potassium - Plan: Comprehensive metabolic panel, Senile cataract of right eye, unspecified age-related cataract type - Plan: Follow-up with ophthalmology I will send a note to Dr. Martinique concerning his most recent EKG.  I do not think that it is playing a role in his dizziness but I will get his input.

## 2019-03-16 ENCOUNTER — Telehealth: Payer: Self-pay | Admitting: Internal Medicine

## 2019-03-16 ENCOUNTER — Telehealth: Payer: Self-pay | Admitting: Family Medicine

## 2019-03-16 LAB — CBC WITH DIFFERENTIAL/PLATELET
Basophils Absolute: 0.1 10*3/uL (ref 0.0–0.2)
Basos: 1 %
EOS (ABSOLUTE): 0 10*3/uL (ref 0.0–0.4)
Eos: 0 %
Hematocrit: 33.2 % — ABNORMAL LOW (ref 37.5–51.0)
Hemoglobin: 10.9 g/dL — ABNORMAL LOW (ref 13.0–17.7)
Immature Grans (Abs): 0 10*3/uL (ref 0.0–0.1)
Immature Granulocytes: 0 %
Lymphocytes Absolute: 0.7 10*3/uL (ref 0.7–3.1)
Lymphs: 7 %
MCH: 29.1 pg (ref 26.6–33.0)
MCHC: 32.8 g/dL (ref 31.5–35.7)
MCV: 89 fL (ref 79–97)
Monocytes Absolute: 0.7 10*3/uL (ref 0.1–0.9)
Monocytes: 7 %
Neutrophils Absolute: 8.5 10*3/uL — ABNORMAL HIGH (ref 1.4–7.0)
Neutrophils: 85 %
Platelets: 274 10*3/uL (ref 150–450)
RBC: 3.74 x10E6/uL — ABNORMAL LOW (ref 4.14–5.80)
RDW: 12.9 % (ref 11.6–15.4)
WBC: 10.1 10*3/uL (ref 3.4–10.8)

## 2019-03-16 LAB — COMPREHENSIVE METABOLIC PANEL
ALT: 16 IU/L (ref 0–44)
AST: 15 IU/L (ref 0–40)
Albumin/Globulin Ratio: 1.4 (ref 1.2–2.2)
Albumin: 3.3 g/dL — ABNORMAL LOW (ref 3.6–4.6)
Alkaline Phosphatase: 119 IU/L — ABNORMAL HIGH (ref 39–117)
BUN/Creatinine Ratio: 18 (ref 10–24)
BUN: 20 mg/dL (ref 8–27)
Bilirubin Total: 0.9 mg/dL (ref 0.0–1.2)
CO2: 24 mmol/L (ref 20–29)
Calcium: 8 mg/dL — ABNORMAL LOW (ref 8.6–10.2)
Chloride: 100 mmol/L (ref 96–106)
Creatinine, Ser: 1.13 mg/dL (ref 0.76–1.27)
GFR calc Af Amer: 68 mL/min/{1.73_m2} (ref >59)
GFR calc non Af Amer: 59 mL/min/{1.73_m2} — ABNORMAL LOW (ref >59)
Globulin, Total: 2.3 g/dL (ref 1.5–4.5)
Glucose: 109 mg/dL — ABNORMAL HIGH (ref 65–99)
Potassium: 3.5 mmol/L (ref 3.5–5.2)
Sodium: 141 mmol/L (ref 134–144)
Total Protein: 5.6 g/dL — ABNORMAL LOW (ref 6.0–8.5)

## 2019-03-16 NOTE — Telephone Encounter (Signed)
Let him know that Dr. Martinique agreed with my approach.  Have him touch bases with [place] in a couple weeks to see how he is doing with the dizziness and his blood pressure

## 2019-03-16 NOTE — Telephone Encounter (Signed)
Patient notified of recommendations.   He states that he would like to know what Dr Martinique says.

## 2019-03-16 NOTE — Telephone Encounter (Signed)
Patient has been rescheduled to 03/22/19 3:30.

## 2019-03-16 NOTE — Telephone Encounter (Signed)
Pt called and is wanting to know if he needs to stop taking the valsartan,hctz for his BP also, states he takes that to, pt is also wanting to make sure that his EKG was sent to his Cardiologist, states he was told to stop taking the [facility] because his BP was running low, pt can be reached at [phone]

## 2019-03-16 NOTE — Telephone Encounter (Signed)
He can continue on the valsartan and to stop the amlodipine.  I am waiting to hear back from Dr. Martinique

## 2019-03-17 NOTE — Telephone Encounter (Signed)
Pt wife wanted to advise pt ankles are swelling and he can't stop the diarrhea . Pt is getting weak . Please advise [facility]

## 2019-03-17 NOTE — Telephone Encounter (Signed)
What can pt do to help diarrhea. Pt has an appt with GI next week .Please advise [facility]

## 2019-03-17 NOTE — Telephone Encounter (Signed)
He had difficulty with C. difficile colitis.  Have him follow-up with his GI doctor.

## 2019-03-18 ENCOUNTER — Inpatient Hospital Stay (HOSPITAL_COMMUNITY)
Admission: EM | Admit: 2019-03-18 | Discharge: 2019-03-31 | DRG: 371 | Disposition: A | Discharge status: Skilled Nursing Facility | Payer: Medicare Other | Source: Skilled Nursing Facility | Attending: Internal Medicine | Admitting: Internal Medicine

## 2019-03-18 ENCOUNTER — Emergency Department (HOSPITAL_COMMUNITY): Payer: Medicare Other

## 2019-03-18 ENCOUNTER — Encounter (HOSPITAL_COMMUNITY): Payer: Self-pay | Admitting: Radiology

## 2019-03-18 ENCOUNTER — Other Ambulatory Visit: Payer: Self-pay

## 2019-03-18 DIAGNOSIS — I110 Hypertensive heart disease with heart failure: Secondary | ICD-10-CM | POA: Diagnosis present

## 2019-03-18 DIAGNOSIS — K8050 Calculus of bile duct without cholangitis or cholecystitis without obstruction: Secondary | ICD-10-CM

## 2019-03-18 DIAGNOSIS — E785 Hyperlipidemia, unspecified: Secondary | ICD-10-CM | POA: Diagnosis not present

## 2019-03-18 DIAGNOSIS — R109 Unspecified abdominal pain: Secondary | ICD-10-CM | POA: Diagnosis not present

## 2019-03-18 DIAGNOSIS — Z79899 Other long term (current) drug therapy: Secondary | ICD-10-CM | POA: Diagnosis not present

## 2019-03-18 DIAGNOSIS — Z7401 Bed confinement status: Secondary | ICD-10-CM | POA: Diagnosis not present

## 2019-03-18 DIAGNOSIS — N179 Acute kidney failure, unspecified: Secondary | ICD-10-CM | POA: Diagnosis not present

## 2019-03-18 DIAGNOSIS — Z9049 Acquired absence of other specified parts of digestive tract: Secondary | ICD-10-CM

## 2019-03-18 DIAGNOSIS — K5100 Ulcerative (chronic) pancolitis without complications: Secondary | ICD-10-CM | POA: Diagnosis present

## 2019-03-18 DIAGNOSIS — I5033 Acute on chronic diastolic (congestive) heart failure: Secondary | ICD-10-CM | POA: Diagnosis present

## 2019-03-18 DIAGNOSIS — E860 Dehydration: Secondary | ICD-10-CM | POA: Diagnosis present

## 2019-03-18 DIAGNOSIS — R1030 Lower abdominal pain, unspecified: Secondary | ICD-10-CM | POA: Diagnosis not present

## 2019-03-18 DIAGNOSIS — Z6820 Body mass index (BMI) 20.0-20.9, adult: Secondary | ICD-10-CM | POA: Diagnosis not present

## 2019-03-18 DIAGNOSIS — A0471 Enterocolitis due to Clostridium difficile, recurrent: Principal | ICD-10-CM | POA: Diagnosis present

## 2019-03-18 DIAGNOSIS — J90 Pleural effusion, not elsewhere classified: Secondary | ICD-10-CM | POA: Diagnosis not present

## 2019-03-18 DIAGNOSIS — Z951 Presence of aortocoronary bypass graft: Secondary | ICD-10-CM

## 2019-03-18 DIAGNOSIS — I471 Supraventricular tachycardia: Secondary | ICD-10-CM | POA: Diagnosis not present

## 2019-03-18 DIAGNOSIS — I2510 Atherosclerotic heart disease of native coronary artery without angina pectoris: Secondary | ICD-10-CM | POA: Diagnosis present

## 2019-03-18 DIAGNOSIS — J810 Acute pulmonary edema: Secondary | ICD-10-CM | POA: Diagnosis not present

## 2019-03-18 DIAGNOSIS — Z79891 Long term (current) use of opiate analgesic: Secondary | ICD-10-CM

## 2019-03-18 DIAGNOSIS — E440 Moderate protein-calorie malnutrition: Secondary | ICD-10-CM | POA: Diagnosis present

## 2019-03-18 DIAGNOSIS — A0472 Enterocolitis due to Clostridium difficile, not specified as recurrent: Secondary | ICD-10-CM | POA: Diagnosis present

## 2019-03-18 DIAGNOSIS — R197 Diarrhea, unspecified: Secondary | ICD-10-CM | POA: Diagnosis not present

## 2019-03-18 DIAGNOSIS — R5381 Other malaise: Secondary | ICD-10-CM | POA: Diagnosis not present

## 2019-03-18 DIAGNOSIS — E876 Hypokalemia: Secondary | ICD-10-CM | POA: Diagnosis not present

## 2019-03-18 DIAGNOSIS — R1084 Generalized abdominal pain: Secondary | ICD-10-CM | POA: Diagnosis not present

## 2019-03-18 DIAGNOSIS — I10 Essential (primary) hypertension: Secondary | ICD-10-CM | POA: Diagnosis not present

## 2019-03-18 DIAGNOSIS — R0603 Acute respiratory distress: Secondary | ICD-10-CM | POA: Diagnosis not present

## 2019-03-18 DIAGNOSIS — I700 Atherosclerosis of aorta: Secondary | ICD-10-CM | POA: Diagnosis present

## 2019-03-18 DIAGNOSIS — M2550 Pain in unspecified joint: Secondary | ICD-10-CM | POA: Diagnosis not present

## 2019-03-18 DIAGNOSIS — K6389 Other specified diseases of intestine: Secondary | ICD-10-CM | POA: Diagnosis not present

## 2019-03-18 DIAGNOSIS — R062 Wheezing: Secondary | ICD-10-CM

## 2019-03-18 DIAGNOSIS — K594 Anal spasm: Secondary | ICD-10-CM | POA: Diagnosis not present

## 2019-03-18 DIAGNOSIS — Z7982 Long term (current) use of aspirin: Secondary | ICD-10-CM | POA: Diagnosis not present

## 2019-03-18 DIAGNOSIS — J811 Chronic pulmonary edema: Secondary | ICD-10-CM | POA: Diagnosis not present

## 2019-03-18 DIAGNOSIS — I499 Cardiac arrhythmia, unspecified: Secondary | ICD-10-CM | POA: Diagnosis not present

## 2019-03-18 DIAGNOSIS — N400 Enlarged prostate without lower urinary tract symptoms: Secondary | ICD-10-CM | POA: Diagnosis present

## 2019-03-18 DIAGNOSIS — Z8619 Personal history of other infectious and parasitic diseases: Secondary | ICD-10-CM

## 2019-03-18 DIAGNOSIS — J9811 Atelectasis: Secondary | ICD-10-CM | POA: Diagnosis not present

## 2019-03-18 DIAGNOSIS — R001 Bradycardia, unspecified: Secondary | ICD-10-CM | POA: Diagnosis not present

## 2019-03-18 DIAGNOSIS — I491 Atrial premature depolarization: Secondary | ICD-10-CM | POA: Diagnosis not present

## 2019-03-18 DIAGNOSIS — R0602 Shortness of breath: Secondary | ICD-10-CM | POA: Diagnosis not present

## 2019-03-18 DIAGNOSIS — I469 Cardiac arrest, cause unspecified: Secondary | ICD-10-CM | POA: Diagnosis not present

## 2019-03-18 DIAGNOSIS — R0902 Hypoxemia: Secondary | ICD-10-CM | POA: Diagnosis not present

## 2019-03-18 DIAGNOSIS — Z20828 Contact with and (suspected) exposure to other viral communicable diseases: Secondary | ICD-10-CM | POA: Diagnosis present

## 2019-03-18 DIAGNOSIS — Z823 Family history of stroke: Secondary | ICD-10-CM

## 2019-03-18 DIAGNOSIS — R0600 Dyspnea, unspecified: Secondary | ICD-10-CM

## 2019-03-18 LAB — CBC WITH DIFFERENTIAL/PLATELET
Abs Immature Granulocytes: 0.14 10*3/uL — ABNORMAL HIGH (ref 0.00–0.07)
Basophils Absolute: 0.1 10*3/uL (ref 0.0–0.1)
Basophils Relative: 0 %
Eosinophils Absolute: 0 10*3/uL (ref 0.0–0.5)
Eosinophils Relative: 0 %
HCT: 32.8 % — ABNORMAL LOW (ref 39.0–52.0)
Hemoglobin: 10.8 g/dL — ABNORMAL LOW (ref 13.0–17.0)
Immature Granulocytes: 1 %
Lymphocytes Relative: 3 %
Lymphs Abs: 0.6 10*3/uL — ABNORMAL LOW (ref 0.7–4.0)
MCH: 30.2 pg (ref 26.0–34.0)
MCHC: 32.9 g/dL (ref 30.0–36.0)
MCV: 91.6 fL (ref 80.0–100.0)
Monocytes Absolute: 1.3 10*3/uL — ABNORMAL HIGH (ref 0.1–1.0)
Monocytes Relative: 7 %
Neutro Abs: 18.5 10*3/uL — ABNORMAL HIGH (ref 1.7–7.7)
Neutrophils Relative %: 89 %
Platelets: 241 10*3/uL (ref 150–400)
RBC: 3.58 MIL/uL — ABNORMAL LOW (ref 4.22–5.81)
RDW: 13.8 % (ref 11.5–15.5)
WBC: 20.6 10*3/uL — ABNORMAL HIGH (ref 4.0–10.5)
nRBC: 0 % (ref 0.0–0.2)

## 2019-03-18 LAB — COMPREHENSIVE METABOLIC PANEL
ALT: 14 U/L (ref 0–44)
AST: 17 U/L (ref 15–41)
Albumin: 2.7 g/dL — ABNORMAL LOW (ref 3.5–5.0)
Alkaline Phosphatase: 87 U/L (ref 38–126)
Anion gap: 12 (ref 5–15)
BUN: 33 mg/dL — ABNORMAL HIGH (ref 8–23)
CO2: 25 mmol/L (ref 22–32)
Calcium: 7.6 mg/dL — ABNORMAL LOW (ref 8.9–10.3)
Chloride: 99 mmol/L (ref 98–111)
Creatinine, Ser: 1.77 mg/dL — ABNORMAL HIGH (ref 0.61–1.24)
GFR calc Af Amer: 39 mL/min — ABNORMAL LOW (ref >60)
GFR calc non Af Amer: 34 mL/min — ABNORMAL LOW (ref >60)
Glucose, Bld: 116 mg/dL — ABNORMAL HIGH (ref 70–99)
Potassium: 2.8 mmol/L — ABNORMAL LOW (ref 3.5–5.1)
Sodium: 136 mmol/L (ref 135–145)
Total Bilirubin: 1.7 mg/dL — ABNORMAL HIGH (ref 0.3–1.2)
Total Protein: 5.8 g/dL — ABNORMAL LOW (ref 6.5–8.1)

## 2019-03-18 LAB — C DIFFICILE QUICK SCREEN W PCR REFLEX
C Diff antigen: POSITIVE — AB
C Diff interpretation: DETECTED
C Diff toxin: POSITIVE — AB

## 2019-03-18 LAB — URINALYSIS, ROUTINE W REFLEX MICROSCOPIC
Bilirubin Urine: NEGATIVE
Glucose, UA: NEGATIVE mg/dL
Ketones, ur: 5 mg/dL — AB
Nitrite: NEGATIVE
Protein, ur: NEGATIVE mg/dL
Specific Gravity, Urine: 1.019 (ref 1.005–1.030)
pH: 5 (ref 5.0–8.0)

## 2019-03-18 LAB — SARS CORONAVIRUS 2 (TAT 6-24 HRS): SARS Coronavirus 2: NEGATIVE

## 2019-03-18 LAB — LIPASE, BLOOD: Lipase: 20 U/L (ref 11–51)

## 2019-03-18 MED ORDER — ASPIRIN EC 81 MG PO TBEC
81.0000 mg | DELAYED_RELEASE_TABLET | Freq: Every day | ORAL | Status: DC
  Administered 2019-03-18 – 2019-03-30 (×13): 81 mg via ORAL
  Filled 2019-03-18 (×13): qty 1

## 2019-03-18 MED ORDER — VANCOMYCIN 50 MG/ML ORAL SOLUTION
125.0000 mg | Freq: Four times a day (QID) | ORAL | Status: DC
  Administered 2019-03-18 – 2019-03-25 (×27): 125 mg via ORAL
  Filled 2019-03-18 (×29): qty 2.5

## 2019-03-18 MED ORDER — SODIUM CHLORIDE 0.9 % IV BOLUS
1000.0000 mL | Freq: Once | INTRAVENOUS | Status: AC
  Administered 2019-03-18: 1000 mL via INTRAVENOUS

## 2019-03-18 MED ORDER — POTASSIUM CHLORIDE CRYS ER 20 MEQ PO TBCR
40.0000 meq | EXTENDED_RELEASE_TABLET | Freq: Once | ORAL | Status: AC
  Administered 2019-03-18: 40 meq via ORAL
  Filled 2019-03-18: qty 2

## 2019-03-18 MED ORDER — POLYVINYL ALCOHOL 1.4 % OP SOLN: Freq: Every day | OPHTHALMIC | Status: DC | PRN

## 2019-03-18 MED ORDER — SODIUM CHLORIDE (PF) 0.9 % IJ SOLN
INTRAMUSCULAR | Status: AC
  Filled 2019-03-18: qty 50

## 2019-03-18 MED ORDER — METRONIDAZOLE IN NACL 5-0.79 MG/ML-% IV SOLN
500.0000 mg | Freq: Three times a day (TID) | INTRAVENOUS | Status: DC
  Administered 2019-03-18 – 2019-03-22 (×13): 500 mg via INTRAVENOUS
  Filled 2019-03-18 (×13): qty 100

## 2019-03-18 MED ORDER — MIRABEGRON ER 25 MG PO TB24
50.0000 mg | ORAL_TABLET | Freq: Every day | ORAL | Status: DC
  Administered 2019-03-18 – 2019-03-30 (×13): 50 mg via ORAL
  Filled 2019-03-18 (×14): qty 2

## 2019-03-18 MED ORDER — VANCOMYCIN 50 MG/ML ORAL SOLUTION: 125.0000 mg | ORAL | Status: DC

## 2019-03-18 MED ORDER — VITAMIN D3 25 MCG (1000 UNIT) PO TABS
1000.0000 [IU] | ORAL_TABLET | Freq: Every day | ORAL | Status: DC
  Administered 2019-03-18 – 2019-03-31 (×14): 1000 [IU] via ORAL
  Filled 2019-03-18 (×13): qty 1

## 2019-03-18 MED ORDER — IOHEXOL 300 MG/ML  SOLN
75.0000 mL | Freq: Once | INTRAMUSCULAR | Status: AC | PRN
  Administered 2019-03-18: 75 mL via INTRAVENOUS

## 2019-03-18 MED ORDER — SODIUM CHLORIDE 0.9 % IV SOLN
INTRAVENOUS | Status: AC
  Administered 2019-03-19: 02:00:00 via INTRAVENOUS

## 2019-03-18 MED ORDER — VANCOMYCIN 50 MG/ML ORAL SOLUTION: 125.0000 mg | Freq: Two times a day (BID) | ORAL | Status: DC

## 2019-03-18 MED ORDER — POTASSIUM CHLORIDE 10 MEQ/100ML IV SOLN
10.0000 meq | Freq: Once | INTRAVENOUS | Status: AC
  Administered 2019-03-18: 10 meq via INTRAVENOUS
  Filled 2019-03-18: qty 100

## 2019-03-18 MED ORDER — POTASSIUM CHLORIDE 10 MEQ/100ML IV SOLN
10.0000 meq | INTRAVENOUS | Status: AC
  Administered 2019-03-18 (×3): 10 meq via INTRAVENOUS
  Filled 2019-03-18 (×4): qty 100

## 2019-03-18 MED ORDER — B COMPLEX-C PO TABS
1.0000 | ORAL_TABLET | Freq: Every day | ORAL | Status: DC
  Administered 2019-03-19 – 2019-03-31 (×13): 1 via ORAL
  Filled 2019-03-18 (×13): qty 1

## 2019-03-18 MED ORDER — ROSUVASTATIN CALCIUM 10 MG PO TABS
20.0000 mg | ORAL_TABLET | Freq: Every day | ORAL | Status: DC
  Administered 2019-03-18 – 2019-03-30 (×13): 20 mg via ORAL
  Filled 2019-03-18 (×13): qty 2

## 2019-03-18 MED ORDER — DUTASTERIDE 0.5 MG PO CAPS
0.5000 mg | ORAL_CAPSULE | Freq: Every evening | ORAL | Status: DC
  Administered 2019-03-18 – 2019-03-30 (×13): 0.5 mg via ORAL
  Filled 2019-03-18 (×14): qty 1

## 2019-03-18 MED ORDER — HYDROCODONE-ACETAMINOPHEN 5-325 MG PO TABS
1.0000 | ORAL_TABLET | Freq: Four times a day (QID) | ORAL | Status: DC | PRN
  Administered 2019-03-20 – 2019-03-31 (×4): 1 via ORAL
  Filled 2019-03-18 (×5): qty 1

## 2019-03-18 MED ORDER — HEPARIN SODIUM (PORCINE) 5000 UNIT/ML IJ SOLN
5000.0000 [IU] | Freq: Three times a day (TID) | INTRAMUSCULAR | Status: DC
  Administered 2019-03-18 – 2019-03-19 (×2): 5000 [IU] via SUBCUTANEOUS
  Filled 2019-03-18 (×2): qty 1

## 2019-03-18 MED ORDER — TAMSULOSIN HCL 0.4 MG PO CAPS
0.4000 mg | ORAL_CAPSULE | Freq: Every evening | ORAL | Status: DC
  Administered 2019-03-18 – 2019-03-30 (×13): 0.4 mg via ORAL
  Filled 2019-03-18 (×12): qty 1

## 2019-03-18 MED ORDER — VANCOMYCIN 50 MG/ML ORAL SOLUTION: 125.0000 mg | Freq: Every day | ORAL | Status: DC

## 2019-03-18 NOTE — Telephone Encounter (Signed)
Pt wife stated he has been taking that it it does not seem to help Please advise [facility]

## 2019-03-18 NOTE — Consult Note (Signed)
Consultation  Referring Provider: TRH/Xu MD  Primary Care Physician:  [name], MD Primary Gastroenterologist:  Dr.Gessner  Reason for Consultation: Recurrent C. difficile colitis  HPI: [name] [name] is a 85 y.o. male, known to Dr. Carlean Purl, with history of previous pancreatitis, and choledocholithiasis.  He also has history of coronary artery disease, status post CABG, status post mitral valve repair, BPH, osteoarthritis, and is status post cholecystectomy. Patient underwent ERCP and stone extraction in 2017. He was treated for C. difficile colitis in September 2019 with a course of vancomycin and resolved. He was seen by Dr. Carlean Purl in early June 2020 with recurrent complaints of diarrhea.  This was a telehealth visit.  He was empirically started on a course of vancomycin, and was not brought in for testing due to his advanced age and COVID pandemic. He was then hospitalized in July 2020 with fever and weakness.  He had been placed on Cipro for couple of days prior to admission, and had Rocephin while hospitalized.  He was noted to have markedly elevated LFTs and underwent repeat ERCP per Dr. Carlean Purl on 03/04/2019 with finding of a dilated common bile duct.  Balloon sweeping was done with removal of sludge and stones, there was no purulence.  Blood cultures were negative. He did well post ERCP, and was not kept on antibiotics on discharge.  He presented to the emergency room today, with complaints of recurrent diarrhea over the past couple of weeks,worse over this past week - with multiple watery stools per day. Any time he drinks anything  He will have urgency and diarrhea which has been incontinent. He has had progressive weakness, and he has not been eating much of anything  due to lack of appetite   Had not been aware of fever at home but has had intermittent shaking chills.  C. difficile quick screen was positive, COVID-19 pending WBC 20.6 Potassium 2.8 Creatinine 1.77 T bili  1.7 transaminases within normal limits  CT of the abdomen and pelvis was done which shows a moderate pancolitis, small right pleural effusion He has intra-and extrahepatic ductal dilation with CBD of 1.6 cm. He has been started on IV metronidazole and oral vancomycin 125 mg every 6 hours.    Past Medical History:  Diagnosis Date  . Arthritis   . BPH (benign prostatic hyperplasia)   . C. difficile colitis 04/20/2018  . CAD (coronary artery disease)    s/p CABG x 1  . DJD (degenerative joint disease)   . Gallstones   . HTN (hypertension)   . Hyperlipidemia   . Lumbar disc disease   . MVP (mitral valve prolapse)    s/p Mitral Valve Repair  . Pancreatitis   . 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 CHOLECYSTECTOMY     Dr. Lennie Hummer  . COLONOSCOPY  ~2008   no polyps per pt.  Dr. [name]  . CORONARY ARTERY BYPASS GRAFT  January 2002   Single bypass to the distal RCA  . ERCP N/A 02/22/2013   Procedure: ENDOSCOPIC RETROGRADE CHOLANGIOPANCREATOGRAPHY (ERCP);  Surgeon: [name], MD;  Location: [facility];  Service: Endoscopy;  Laterality: N/A;  . ERCP N/A 09/21/2015   Procedure: ENDOSCOPIC RETROGRADE CHOLANGIOPANCREATOGRAPHY (ERCP);  Surgeon: [name], MD;  Location: [facility] ENDOSCOPY;  Service: Endoscopy;  Laterality: N/A;  with spyglass  . ERCP N/A 03/04/2019  Procedure: ENDOSCOPIC RETROGRADE CHOLANGIOPANCREATOGRAPHY (ERCP);  Surgeon: [name], MD;  Location: [facility];  Service: Endoscopy;  Laterality: N/A;  . HERNIA REPAIR  05/05/11   Lap L inguinal & obturator herniae, R Femoral Hernia  . MITRAL VALVE REPAIR  January 2002  . REMOVAL OF STONES  03/04/2019   Procedure: REMOVAL OF STONES;  Surgeon: [name], MD;  Location: [facility] ENDOSCOPY;  Service: Endoscopy;;  . Bess Kinds CHOLANGIOSCOPY N/A 02/22/2013   Procedure: BOFBPZWC  CHOLANGIOSCOPY;  Surgeon: [name], MD;  Location: [facility] ENDOSCOPY;  Service: Endoscopy;  Laterality: N/A;  . SPYGLASS CHOLANGIOSCOPY N/A 09/21/2015   Procedure: HENIDPOE CHOLANGIOSCOPY;  Surgeon: [name], MD;  Location: [place];  Service: Endoscopy;  Laterality: N/A;  Bess Kinds LITHOTRIPSY N/A 02/22/2013   Procedure: UMPNTIRW LITHOTRIPSY;  Surgeon: [name], MD;  Location: [facility] ENDOSCOPY;  Service: Endoscopy;  Laterality: N/A;  litho ord'd 7/8 by DL/PO [id number] [facility]  . UPPER GASTROINTESTINAL ENDOSCOPY  02/22/13    Prior to Admission medications   Medication Sig Start Date End Date Taking? Authorizing Provider  acetaminophen (TYLENOL) 500 MG tablet Take 1,000 mg by mouth every 6 (six) hours as needed for moderate pain.    Yes [provider]  aspirin 81 MG tablet Take 81 mg by mouth daily with supper.    Yes [provider]  b complex vitamins tablet Take 1 tablet by mouth daily.   Yes [provider]  Cholecalciferol (VITAMIN D) 1000 UNITS capsule Take 1,000 Units by mouth daily.     Yes [provider]  dutasteride (AVODART) 0.5 MG capsule Take 0.5 mg by mouth every evening.    Yes [provider]  famotidine (PEPCID) 20 MG tablet Take 20 mg by mouth daily as needed for heartburn or indigestion.   Yes [provider]  gabapentin (NEURONTIN) 300 MG capsule Take 300 mg by mouth 4 (four) times daily.   Yes [provider]  GLUCOSAMINE-CHONDROITIN PO Take 1 tablet by mouth 2 (two) times a day.    Yes [provider]  HYDROcodone-acetaminophen (NORCO/VICODIN) 5-325 MG tablet Take 1 tablet by mouth every 6 (six) hours as needed for moderate pain.  05/15/16  Yes [provider]  MYRBETRIQ 50 MG TB24 tablet Take 1 tablet by mouth daily with supper.  09/03/16  Yes [provider]  Polyethyl Glycol-Propyl Glycol (SYSTANE OP) Place 1 drop into both eyes daily as needed (dry eyes).   Yes [provider]  rosuvastatin (CRESTOR) 20 MG tablet Take 1 tablet (20 mg total) by mouth daily. Patient taking differently: Take 20 mg by mouth daily with supper.  01/24/19  Yes [name], MD  Simethicone (GAS-X PO) Take 1 tablet by mouth daily as needed (gas).    Yes [provider]  Tamsulosin HCl (FLOMAX) 0.4 MG CAPS Take 0.4 mg by mouth every evening.    Yes [provider]  valsartan-hydrochlorothiazide (DIOVAN-HCT) 320-12.5 MG tablet Take 1 tablet by mouth every morning. 01/25/19  Yes Martinique, Peter M, MD  Wheat Dextrin (BENEFIBER PO) Take 1 Dose by mouth daily.   Yes [provider]  amLODipine (NORVASC) 5 MG tablet take 1 tablet by mouth once daily every evening Patient not taking: Reported on 03/18/2019 11/19/18   Martinique, Peter M, MD    Current Facility-Administered Medications  Medication Dose Route Frequency Provider Last Rate Last Dose  . metroNIDAZOLE (FLAGYL) IVPB 500 mg  500 mg Intravenous Q8H Florencia Reasons, MD  100 mL/hr at 03/18/19 1456 500 mg at 03/18/19 1456  . potassium chloride 10 mEq in 100 mL IVPB  10 mEq Intravenous Q1 Hr x 4 Florencia Reasons, MD 100 mL/hr at 03/18/19 1456 10 mEq at 03/18/19 1456  . sodium chloride (PF) 0.9 % injection           . vancomycin (VANCOCIN) 50 mg/mL oral solution 125 mg  125 mg Oral QID Domenic Moras, PA-C   125 mg at 03/18/19 1457   Followed by  . [START ON 04/01/2019] vancomycin (VANCOCIN) 50 mg/mL oral solution 125 mg  125 mg Oral BID Domenic Moras, PA-C       Followed by  . [START ON 04/09/2019] vancomycin (VANCOCIN) 50 mg/mL oral solution 125 mg  125 mg Oral Daily Domenic Moras, PA-C       Followed by  . [START ON 04/16/2019] vancomycin (VANCOCIN) 50 mg/mL oral solution 125 mg  125 mg Oral Alla German, PA-C       Followed by  . [START ON 05/14/2019] vancomycin (VANCOCIN) 50 mg/mL oral solution 125 mg  125 mg Oral Q3 days Domenic Moras, PA-C       Current Outpatient Medications  Medication Sig Dispense Refill  . acetaminophen  (TYLENOL) 500 MG tablet Take 1,000 mg by mouth every 6 (six) hours as needed for moderate pain.     Marland Kitchen aspirin 81 MG tablet Take 81 mg by mouth daily with supper.     Marland Kitchen b complex vitamins tablet Take 1 tablet by mouth daily.    . Cholecalciferol (VITAMIN D) 1000 UNITS capsule Take 1,000 Units by mouth daily.      Marland Kitchen dutasteride (AVODART) 0.5 MG capsule Take 0.5 mg by mouth every evening.     . famotidine (PEPCID) 20 MG tablet Take 20 mg by mouth daily as needed for heartburn or indigestion.    . gabapentin (NEURONTIN) 300 MG capsule Take 300 mg by mouth 4 (four) times daily.    Marland Kitchen GLUCOSAMINE-CHONDROITIN PO Take 1 tablet by mouth 2 (two) times a day.     Marland Kitchen HYDROcodone-acetaminophen (NORCO/VICODIN) 5-325 MG tablet Take 1 tablet by mouth every 6 (six) hours as needed for moderate pain.     Marland Kitchen MYRBETRIQ 50 MG TB24 tablet Take 1 tablet by mouth daily with supper.     [name] Glycol-Propyl Glycol (SYSTANE OP) Place 1 drop into both eyes daily as needed (dry eyes).    . rosuvastatin (CRESTOR) 20 MG tablet Take 1 tablet (20 mg total) by mouth daily. (Patient taking differently: Take 20 mg by mouth daily with supper. ) 90 tablet 0  . Simethicone (GAS-X PO) Take 1 tablet by mouth daily as needed (gas).     . Tamsulosin HCl (FLOMAX) 0.4 MG CAPS Take 0.4 mg by mouth every evening.     . valsartan-hydrochlorothiazide (DIOVAN-HCT) 320-12.5 MG tablet Take 1 tablet by mouth every morning. 90 tablet 2  . Wheat Dextrin (BENEFIBER PO) Take 1 Dose by mouth daily.    Marland Kitchen amLODipine (NORVASC) 5 MG tablet take 1 tablet by mouth once daily every evening (Patient not taking: Reported on 03/18/2019) 90 tablet 1    Allergies as of 03/18/2019  . (No Known Allergies)    Family History  Problem Relation Age of Onset  . Stroke Father   . Stroke Mother        TIA  . Esophageal cancer Neg Hx     Social History   Socioeconomic History  . Marital  status: Married    Spouse name: Not on file  . Number of children: 2  .  Years of education: Not on file  . Highest education level: Not on file  Occupational History  . Occupation: Retired    Comment: Passenger transport manager: RETIRED  Social Needs  . Financial resource strain: Not on file  . Food insecurity    Worry: Not on file    Inability: Not on file  . Transportation needs    Medical: Not on file    Non-medical: Not on file  Tobacco Use  . Smoking status: Never Smoker  . Smokeless tobacco: Never Used  Substance and Sexual Activity  . Alcohol use: Yes    Alcohol/week: 1.0 - 2.0 standard drinks    Types: 1 - 2 Glasses of wine per week    Comment: Ocassionally  . Drug use: No  . Sexual activity: Not on file  Lifestyle  . Physical activity    Days per week: Not on file    Minutes per session: Not on file  . Stress: Not on file  Relationships  . Social Herbalist on phone: Not on file    Gets together: Not on file    Attends religious service: Not on file    Active member of club or organization: Not on file    Attends meetings of clubs or organizations: Not on file    Relationship status: Not on file  . Intimate partner violence    Fear of current or ex partner: Not on file    Emotionally abused: Not on file    Physically abused: Not on file    Forced sexual activity: Not on file  Other Topics Concern  . Not on file  Social History Narrative   Married, retired from [employer], lives at [place]   Occasional alcohol not a smoker no drug use   2 children    Review of Systems: Pertinent positive and negative review of systems were noted in the above HPI section.  All other review of systems was otherwise negative.  Physical Exam: Vital signs in last 24 hours: Temp:  [98.8 F (37.1 C)] 98.8 F (37.1 C) (08/07 1014) Pulse Rate:  [55-91] 55 (08/07 1530) Resp:  [18-19] 18 (08/07 1530) BP: (103-127)/(51-76) 127/71 (08/07 1530) SpO2:  [90 %-96 %] 96 % (08/07 1530) Weight:  [73 kg] 73 kg (08/07 1016)   General:    Alert,  Well-developed, elderly WM,well-nourished, pleasant and cooperative in NAD Head:  Normocephalic and atraumatic. Eyes:  Sclera clear, no icterus.   Conjunctiva pink. Ears:  Normal auditory acuity. Nose:  No deformity, discharge,  or lesions. Mouth:  No deformity or lesions.  Mucosa dry Neck:  Supple; no masses or thyromegaly. Lungs:  Clear throughout to auscultation.   No wheezes, crackles, or rhonchi. Heart: tachy Regular rate and rhythm; no murmurs, clicks, rubs,  or gallops.sternal scar Abdomen:  Soft,nontender, BS hyperactive,nonpalp mass or hsm.   Rectal:  Deferred  Msk:  Symmetrical without gross deformities. . Pulses:  Normal pulses noted. Extremities:  Without clubbing or edema. Neurologic:  Alert and  oriented x4;  grossly normal neurologically. Skin:  Intact without significant lesions or rashes.. Psych:  Alert and cooperative. Normal mood and affect.  Intake/Output from previous day: No intake/output data recorded. Intake/Output this shift: Total I/O In: 1100 [IV Piggyback:1100] Out: -   Lab Results: Recent Labs    03/18/19 1103  WBC 20.6*  HGB 10.8*  HCT 32.8*  PLT 241   BMET Recent Labs    03/18/19 1103  NA 136  K 2.8*  CL 99  CO2 25  GLUCOSE 116*  BUN 33*  CREATININE 1.77*  CALCIUM 7.6*   LFT Recent Labs    03/18/19 1103  PROT 5.8*  ALBUMIN 2.7*  AST 17  ALT 14  ALKPHOS 87  BILITOT 1.7*   PT/INR No results for input(s): LABPROT, INR in the last 72 hours. Hepatitis Panel No results for input(s): HEPBSAG, HCVAB, HEPAIGM, HEPBIGM in the last 72 hours.    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 Patient has prior history of C. difficile colitis September 2019 which was treated with vancomycin and resolved. He developed recurrent C. difficile in  June 2020, again treated with a course of vancomycin.  He then required hospitalization in July and was reexposed to antibiotics for a few days and immediately developed diarrhea thereafter.  I suspect this is relapsing C. difficile rather than recurrent C. Difficile.  #2 hypokalemia secondary to above #3 weakness and lack of appetite secondary to above #4 coronary artery disease status post remote CABG #5 status post recent ERCP with extraction of stones and sludge 03/04/2019.  He is status post cholecystectomy. Common bile duct remains dilated on CT today, this is chronic.  No indication by LFTs today for concerns of recurrent choledocholithiasis. #6 history of hypertension 7.  Status post mitral valve repair   PLAN: #1 clear to full liquid diet #2 place rectal tube #3 for now continue IV metronidazole as long as tolerating #4 vancomycin 125 mg suspension 4 times daily.  He will require a long tapered course of vancomycin over the next 8 to 12 weeks. #5 add Florastor when taking p.o.'s better #6 correct hypokalemia #7 volume replacement  Thank you we will follow with you   Coretta Leisey PA-C 03/18/2019, 3:51 PM

## 2019-03-18 NOTE — Telephone Encounter (Signed)
Pt went to er and they had not admitted him yet per wife. [place]

## 2019-03-18 NOTE — ED Notes (Signed)
Date and time results received: 03/18/19 1230   Test: C-Diff  Critical Value: positive  Name of Provider Notified: Domenic Moras

## 2019-03-18 NOTE — ED Provider Notes (Signed)
[facility] DEPT Provider Note   CSN: [id number] Arrival date & time: 03/18/19  2355     History   Chief Complaint Chief Complaint  Patient presents with  . Abdominal Pain    HPI [name] [name] is a 85 y.o. male.     The history is provided by the patient and medical records. No language interpreter was used.  Abdominal Pain    85 year old male with prior history of seizure, pancreatitis, CAD, gallstones, brought here via EMS for evaluation of diarrhea.  Patient report for the past 2 weeks he has had intermittent diarrhea as well as lower abdominal cramping.  Diarrhea has increased within the past few days states he went at least 15 times throughout the day today of loose stools without blood or mucus.  He does have the same appearance as his stool that he had when he had C. difficile infection 2 months ago.  He felt that his symptoms started after having an ERCP procedure recently.  He mention his GI specialist, Dr. Carlean Purl, did test a stool several weeks prior at the start of his symptoms but it was negative.  His symptoms do persist.  He endorsed generalized weakness and lightheadedness for the past several months.  He felt anything that he eats and drinks would provoke his diarrhea and therefore he has not been consuming much.  He does not complain of any nausea or vomiting.  His abdominal pain is mild.  He denies any recent antibiotic use.  Patient currently lives with his wife at a retirement living facility.  Patient denies any recent sick contact with anyone with COVID-19.  He denies any chest pain shortness of breath or cough.  No dysuria.  Past Medical History:  Diagnosis Date  . Arthritis   . BPH (benign prostatic hyperplasia)   . C. difficile colitis 04/20/2018  . CAD (coronary artery disease)    s/p CABG x 1  . DJD (degenerative joint disease)   . Gallstones   . HTN (hypertension)   . Hyperlipidemia   . Lumbar disc disease   . MVP  (mitral valve prolapse)    s/p Mitral Valve Repair  . Pancreatitis   . Wandering (atrial) pacemaker 11/22/2013   with frequent multifocal ectopy (PACs, PVCs, junctional escape beats    Patient Active Problem List   Diagnosis Date Noted  . Cholangitis due to bile duct calculus with obstruction 03/03/2019  . Hypokalemia due to excessive gastrointestinal loss of potassium 03/03/2019  . Indigestion 01/31/2019  . C. difficile colitis 01/18/2019  . IBS (irritable bowel syndrome) 01/18/2019  . Pain in right hip 01/01/2017  . Peripheral neuropathy 10/13/2014  . Spinal stenosis of lumbar region 10/13/2014  . Wandering pacemaker 11/22/2013  . PAC (premature atrial contraction) 11/17/2012  . Arthritis 09/30/2011  . Left obturator hernia 05/21/2011  . Right femoral hernia 05/21/2011  . Left inguinal hernia 04/10/2011  . DOE (dyspnea on exertion) 11/06/2010  . PVC's (premature ventricular contractions) 11/06/2010  . S/P mitral valve repair 11/06/2010  . Fatigue 11/06/2010  . CAD (coronary artery disease)   . HTN (hypertension)   . Hyperlipidemia     Past Surgical History:  Procedure Laterality Date  . CHOLECYSTECTOMY     Dr. Lennie Hummer  . COLONOSCOPY  ~2008   no polyps per pt.  Dr. [name]  . CORONARY ARTERY BYPASS GRAFT  January 2002   Single bypass to the distal RCA  . ERCP N/A 02/22/2013  Procedure: ENDOSCOPIC RETROGRADE CHOLANGIOPANCREATOGRAPHY (ERCP);  Surgeon: [name], MD;  Location: [facility];  Service: Endoscopy;  Laterality: N/A;  . ERCP N/A 09/21/2015   Procedure: ENDOSCOPIC RETROGRADE CHOLANGIOPANCREATOGRAPHY (ERCP);  Surgeon: [name], MD;  Location: [facility]COPY;  Service: Endoscopy;  Laterality: N/A;  with spyglass  . ERCP N/A 03/04/2019   Procedure: ENDOSCOPIC RETROGRADE CHOLANGIOPANCREATOGRAPHY (ERCP);  Surgeon: [name], MD;  Location: [facility];  Service: Endoscopy;  Laterality: N/A;  . HERNIA REPAIR  05/05/11   Lap L inguinal &  obturator herniae, R Femoral Hernia  . MITRAL VALVE REPAIR  January 2002  . REMOVAL OF STONES  03/04/2019   Procedure: REMOVAL OF STONES;  Surgeon: [name], MD;  Location: [facility] ENDOSCOPY;  Service: Endoscopy;;  . Bess Kinds CHOLANGIOSCOPY N/A 02/22/2013   Procedure: LEXNTZGY CHOLANGIOSCOPY;  Surgeon: [name], MD;  Location: [facility]OS[facility];  Service: Endoscopy;  Laterality: N/A;  . SPYGLASS CHOLANGIOSCOPY N/A 09/21/2015   Procedure: FVCBSWHQ CHOLANGIOSCOPY;  Surgeon: [name], MD;  Location: [facility];  Service: Endoscopy;  Laterality: N/A;  Bess Kinds LITHOTRIPSY N/A 02/22/2013   Procedure: PRFFMBWG LITHOTRIPSY;  Surgeon: [name], MD;  Location: [facility] ENDOS[facility];  Service: Endoscopy;  Laterality: N/A;  litho ord'd 7/8 by DL/PO [phone] [facility]  . UPPER GASTROINTESTINAL ENDOSCOPY  02/22/13        Home Medications    Prior to Admission medications   Medication Sig Start Date End Date Taking? Authorizing Provider  acetaminophen (TYLENOL) 500 MG tablet Take 500 mg by mouth every 6 (six) hours as needed for moderate pain.    [provider]  amLODipine (NORVASC) 5 MG tablet take 1 tablet by mouth once daily every evening Patient taking differently: Take 5 mg by mouth every evening. take 1 tablet by mouth once daily every evenin 11/19/18   Martinique, Peter M, MD  aspirin 81 MG tablet Take 81 mg by mouth daily with supper.     [provider]  b complex vitamins tablet Take 1 tablet by mouth daily.    [provider]  Cholecalciferol (VITAMIN D) 1000 UNITS capsule Take 1,000 Units by mouth daily.      [provider]  dutasteride (AVODART) 0.5 MG capsule Take 0.5 mg by mouth every evening.     [provider]  famotidine (PEPCID) 20 MG tablet Take 20 mg by mouth daily as needed for heartburn or indigestion.    [provider]  gabapentin (NEURONTIN) 300 MG capsule Take 300 mg by mouth 4 (four) times daily.    [provider]  GLUCOSAMINE-CHONDROITIN PO Take 1 tablet by mouth 2 (two) times a day.     [provider]  HYDROcodone-acetaminophen (NORCO/VICODIN) 5-325 MG tablet Take 1 tablet by mouth every 6 (six) hours as needed for moderate pain.  05/15/16   [provider]  MYRBETRIQ 50 MG TB24 tablet Take 1 tablet by mouth daily with supper.  09/03/16   [provider]  Polyethyl Glycol-Propyl Glycol (SYSTANE OP) Place 1 drop into both eyes daily as needed (dry eyes).    [provider]  rosuvastatin (CRESTOR) 20 MG tablet Take 1 tablet (20 mg total) by mouth daily. Patient taking differently: Take 20 mg by mouth daily with supper.  01/24/19   [name], MD  Simethicone (GAS-X PO) Take 1 tablet by mouth daily as needed (gas).     [provider]  Tamsulosin HCl (FLOMAX) 0.4 MG CAPS Take 0.4  mg by mouth every evening.     [provider]  valsartan-hydrochlorothiazide (DIOVAN-HCT) 320-12.5 MG tablet Take 1 tablet by mouth every morning. 01/25/19   Martinique, Peter M, MD  Wheat Dextrin (BENEFIBER PO) Take 1 Dose by mouth daily.    [provider]    Family History Family History  Problem Relation Age of Onset  . Stroke Father   . Stroke Mother        TIA  . Esophageal cancer Neg Hx     Social History Social History   Tobacco Use  . Smoking status: Never Smoker  . Smokeless tobacco: Never Used  Substance Use Topics  . Alcohol use: Yes    Alcohol/week: 1.0 - 2.0 standard drinks    Types: 1 - 2 Glasses of wine per week    Comment: Ocassionally  . Drug use: No     Allergies   Patient has no known allergies.   Review of Systems Review of Systems  Gastrointestinal: Positive for abdominal pain.  All other systems reviewed and are negative.    Physical Exam Updated Vital Signs BP 107/63 (BP Location: Left Arm)   Pulse 91   Temp 98.8 F (37.1 C) (Oral)   Resp 18   Ht 6' 2"  (1.88 m)   Wt 73 kg   SpO2 90%   BMI 20.67 kg/m    Physical Exam Vitals signs and nursing note reviewed.  Constitutional:      General: He is not in acute distress.    Appearance: He is well-developed.     Comments: Elderly male nontoxic in appearance  HENT:     Head: Atraumatic.  Eyes:     Conjunctiva/sclera: Conjunctivae normal.  Neck:     Musculoskeletal: Neck supple.  Cardiovascular:     Rate and Rhythm: Normal rate and regular rhythm.  Pulmonary:     Effort: Pulmonary effort is normal.     Breath sounds: Normal breath sounds.  Abdominal:     General: Abdomen is flat. Bowel sounds are normal.     Palpations: Abdomen is soft.     Tenderness: There is abdominal tenderness in the left lower quadrant.  Skin:    Findings: No rash.  Neurological:     Mental Status: He is alert.      ED Treatments / Results  Labs (all labs ordered are listed, but only abnormal results are displayed) Labs Reviewed  C DIFFICILE QUICK SCREEN W PCR REFLEX - Abnormal; Notable for the following components:      Result Value   C Diff antigen POSITIVE (*)    C Diff toxin POSITIVE (*)    All other components within normal limits  CBC WITH DIFFERENTIAL/PLATELET - Abnormal; Notable for the following components:   WBC 20.6 (*)    RBC 3.58 (*)    Hemoglobin 10.8 (*)    HCT 32.8 (*)    Neutro Abs 18.5 (*)    Lymphs Abs 0.6 (*)    Monocytes Absolute 1.3 (*)    Abs Immature Granulocytes 0.14 (*)    All other components within normal limits  COMPREHENSIVE METABOLIC PANEL - Abnormal; Notable for the following components:   Potassium 2.8 (*)    Glucose, Bld 116 (*)    BUN 33 (*)    Creatinine, Ser 1.77 (*)    Calcium 7.6 (*)    Total Protein 5.8 (*)    Albumin 2.7 (*)    Total Bilirubin 1.7 (*)    GFR calc  non Af Amer 34 (*)    GFR calc Af Amer 39 (*)    All other components within normal limits  URINALYSIS, ROUTINE W REFLEX MICROSCOPIC - Abnormal; Notable for the following components:   Color, Urine AMBER (*)    APPearance HAZY (*)    Hgb  urine dipstick SMALL (*)    Ketones, ur 5 (*)    Leukocytes,Ua TRACE (*)    Bacteria, UA RARE (*)    All other components within normal limits  SARS CORONAVIRUS 2  LIPASE, BLOOD    EKG EKG Interpretation  Date/Time:  Friday March 18 2019 10:12:54 EDT Ventricular Rate:  76 PR Interval:    QRS Duration: 115 QT Interval:  454 QTC Calculation: 511 R Axis:   -28 Text Interpretation:  Sinus arrhythmia Nonspecific intraventricular conduction delay Confirmed by Virgel Manifold [phone]) on 03/18/2019 1:16:01 PM   ED ECG REPORT   Date: 03/18/2019  Rate: 76  Rhythm: normal sinus rhythm  QRS Axis: left  Intervals: normal  ST/T Wave abnormalities: normal  Conduction Disutrbances:nonspecific intraventricular conduction delay  Narrative Interpretation:   Old EKG Reviewed: unchanged  I have personally reviewed the EKG tracing and agree with the computerized printout as noted.   Radiology Ct Abdomen Pelvis W Contrast  Result Date: 03/18/2019 CLINICAL DATA:  Generalized abdominal pain with weakness and diarrhea. Recent Clostridium difficile infection. Diverticulitis suspected. EXAM: CT ABDOMEN AND PELVIS WITH CONTRAST TECHNIQUE: Multidetector CT imaging of the abdomen and pelvis was performed using the standard protocol following bolus administration of intravenous contrast. CONTRAST:  13m OMNIPAQUE IOHEXOL 300 MG/ML  SOLN COMPARISON:  Abdominopelvic CT 08/16/2014.  MRCP 09/20/2015. FINDINGS: Lower chest: There are new patchy dependent opacities at both lung bases, likely atelectasis. There is a small right pleural effusion. The heart is enlarged post median sternotomy. There is atherosclerosis of the aorta and coronary arteries and prominent calcifications of the aortic valve. Hepatobiliary: Cystic lesion peripherally in the left hepatic lobe measuring 3.6 cm on image 21/2 is similar to the previous study. There is similar pneumobilia with intra and extrahepatic biliary dilatation. The common  hepatic duct measures up to 1.6 cm in diameter. No new hepatic lesions identified. Pancreas: Stable mild dilatation of the pancreatic duct. No focal pancreatic abnormality or surrounding inflammation. Spleen: Normal in size without focal abnormality. Adrenals/Urinary Tract: Both adrenal glands appear normal. Similar dominant cyst involving the upper pole of the right kidney, bilateral nephrolithiasis and bilateral renal sinus cysts. The contrast bolus is suboptimal, and no significant contrast excretion is identified on early delayed post-contrast images through the kidneys. There is no hydronephrosis or evidence of ureteral calculus. The bladder appears normal. Stomach/Bowel: No enteric contrast was administered. The stomach is decompressed. Small bowel appears unremarkable. There is moderate diffuse colonic wall thickening consistent with colitis. Underlying diverticular changes are present within the descending and sigmoid colon, and the wall thickening in this area appears slightly greater. No evidence of bowel perforation, obstruction or abscess. Vascular/Lymphatic: There are no enlarged abdominal or pelvic lymph nodes. Moderate aortic and branch vessel atherosclerosis. No acute vascular findings. Reproductive: The prostate gland is moderately enlarged, but stable. Other: Stable subxiphoid hernia containing fat.  No ascites. Musculoskeletal: No acute osseous findings. Stable multilevel spondylosis associated with a convex right scoliosis. IMPRESSION: 1. Diffuse colonic wall thickening and mild surrounding inflammation consistent with pancolitis, presumably from the patient's known Clostridium difficile colitis. There are underlying moderate diverticular changes in the sigmoid colon, and the wall thickening in this area is  slightly greater, potentially due to superimposed diverticulitis. 2. No evidence of bowel obstruction, perforation or abscess. 3. Stable chronic biliary dilatation and pneumobilia. 4. New  small right pleural effusion and probable bibasilar atelectasis. 5. Stable additional incidental findings including calcifications of the aortic valve, hepatic and renal cysts, bilateral nephrolithiasis, a ventral abdominal wall hernia, lumbar spondylosis and Aortic Atherosclerosis (ICD10-I70.0). Electronically Signed   By: Richardean Sale M.D.   On: 03/18/2019 13:20    Procedures .Critical Care Performed by: Domenic Moras, PA-C Authorized by: Domenic Moras, PA-C   Critical care provider statement:    Critical care time (minutes):  45   Critical care was time spent personally by me on the following activities:  Discussions with consultants, evaluation of patient's response to treatment, examination of patient, ordering and performing treatments and interventions, ordering and review of laboratory studies, ordering and review of radiographic studies, pulse oximetry, re-evaluation of patient's condition, obtaining history from patient or surrogate and review of old charts   (including critical care time)  Medications Ordered in ED Medications  potassium chloride SA (K-DUR) CR tablet 40 mEq (has no administration in time range)  potassium chloride 10 mEq in 100 mL IVPB (has no administration in time range)  sodium chloride (PF) 0.9 % injection (has no administration in time range)  vancomycin (VANCOCIN) 50 mg/mL oral solution 125 mg (has no administration in time range)    Followed by  vancomycin (VANCOCIN) 50 mg/mL oral solution 125 mg (has no administration in time range)    Followed by  vancomycin (VANCOCIN) 50 mg/mL oral solution 125 mg (has no administration in time range)    Followed by  vancomycin (VANCOCIN) 50 mg/mL oral solution 125 mg (has no administration in time range)    Followed by  vancomycin (VANCOCIN) 50 mg/mL oral solution 125 mg (has no administration in time range)  sodium chloride 0.9 % bolus 1,000 mL (1,000 mLs Intravenous New Bag/Given 03/18/19 1104)  iohexol (OMNIPAQUE)  300 MG/ML solution 75 mL (75 mLs Intravenous Contrast Given 03/18/19 1237)     Initial Impression / Assessment and Plan / ED Course  I have reviewed the triage vital signs and the nursing notes.  Pertinent labs & imaging results that were available during my care of the patient were reviewed by me and considered in my medical decision making (see chart for details).        BP 107/63 (BP Location: Left Arm)   Pulse 91   Temp 98.8 F (37.1 C) (Oral)   Resp 18   Ht 6' 2"  (1.88 m)   Wt 73 kg   SpO2 90%   BMI 20.67 kg/m    Final Clinical Impressions(s) / ED Diagnoses   Final diagnoses:  C. difficile colitis  Hypokalemia    ED Discharge Orders    None     10:31 AM Patient with known history of recurrent C. difficile who is here for persistent diarrhea concerning for potential recurrence of his C. difficile.  He does have some mild lower abdominal discomfort on exam therefore will obtain CT to rule out diverticulitis.  IV fluid given, work-up initiated.  GI specialist is Dr. Carlean Purl.  12:17 PM Labs remarkable for elevated white count of 20.6 likely suggestive of underlying C. difficile infection.  His potassium level is 2.8, will obtain EKG and will provide supplementation.  Evidence of AKI with a creatinine of 1.77, IV fluid given. Care discussed with DR. Kohut.   1:27 PM Patient test positive for C.  difficile.  Abdominal pelvis CT scan demonstrate diffuse colonic wall thickening and mild surrounding inflammation consistent with pancolitis. Moderate diverticular change in the sigmoid colon and the wall thickening in this area slightly greater, potentially due to a superimposed diverticulitis.  No evidence of bowel obstruction, perforations or abscess.  In the setting of this infection which is been recurrent, patient generalized weakness, and hypokalemia, will consult medicine for admission.  We will continue with IV hydration, initiate oral vancomycin as treatment.  Patient  may be a candidate for fecal transplantation as treatment of his recurrent C. Difficile.  1:54 PM Appreciate consultation from [facility], Dr. Florencia Reasons who agrees to see and admit pt for further care.  Pt voice understanding and agrees with plan.     Domenic Moras, PA-C 03/18/19 1355    Virgel Manifold, MD 03/19/19 [phone]

## 2019-03-18 NOTE — ED Triage Notes (Signed)
Transported by [facility] form home-- onset of weakness, abdominal pain (generalized), and diarrhea x weeks ago. 20 G IV in left hand. 500 cc of NS administered. CBG 125 mg/dl. VSS. Hx of C. Diff.

## 2019-03-18 NOTE — ED Notes (Signed)
ED TO INPATIENT HANDOFF REPORT  Name/Age/Gender [name] [name] 85 y.o. male  Code Status Code Status History    Date Active Date Inactive Code Status Order ID Comments User Context   03/03/2019 1747 03/05/2019 1829 Full Code [id number]  Barb Merino, MD Inpatient   09/19/2015 1757 09/22/2015 1541 Full Code [id number]  Debbe Odea, MD Inpatient   Advance Care Planning Activity      Home/SNF/Other Home  Chief Complaint diarrhea  Level of Care/Admitting Diagnosis ED Disposition    ED Disposition Condition [facility] Area: [facility] [[id number]]  Level of Care: Telemetry [5]  Admit to tele based on following criteria: Monitor QTC interval  Covid Evaluation: Asymptomatic Screening Protocol (No Symptoms)  Diagnosis: C. difficile colitis [480165]  Admitting Physician: Florencia Reasons [[id number]]  Attending Physician: Florencia Reasons [[id number]]  Estimated length of stay: past midnight tomorrow  Certification:: I certify this patient will need inpatient services for at least 2 midnights  PT Class (Do Not Modify): Inpatient [101]  PT Acc Code (Do Not Modify): Private [1]       Medical History Past Medical History:  Diagnosis Date  . Arthritis   . BPH (benign prostatic hyperplasia)   . C. difficile colitis 04/20/2018  . CAD (coronary artery disease)    s/p CABG x 1  . DJD (degenerative joint disease)   . Gallstones   . HTN (hypertension)   . Hyperlipidemia   . Lumbar disc disease   . MVP (mitral valve prolapse)    s/p Mitral Valve Repair  . Pancreatitis   . Wandering (atrial) pacemaker 11/22/2013   with frequent multifocal ectopy (PACs, PVCs, junctional escape beats    Allergies No Known Allergies  IV Location/Drains/Wounds Patient Lines/Drains/Airways Status   Active Line/Drains/Airways    Name:   Placement date:   Placement time:   Site:   Days:   Peripheral IV 03/18/19 Right Antecubital   03/18/19    1106    Antecubital   less than 1           Labs/Imaging Results for orders placed or performed during the hospital encounter of 03/18/19 (from the past 76 hour(s))  C difficile quick scan w PCR reflex     Status: Abnormal   Collection Time: 03/18/19 11:03 AM   Specimen: STOOL  Result Value Ref Range   C Diff antigen POSITIVE (A) NEGATIVE   C Diff toxin POSITIVE (A) NEGATIVE   C Diff interpretation Toxin producing C. difficile detected.     Comment: RESULT CALLED TO, READ BACK BY AND VERIFIED WITH: Ruthellen Tippy, M. RN @1231  ON 8.7.2020 BY NMCCOY Performed at [facility], [place] [address], [place], [place] [address]   CBC with Differential     Status: Abnormal   Collection Time: 03/18/19 11:03 AM  Result Value Ref Range   WBC 20.6 (H) 4.0 - 10.5 K/uL   RBC 3.58 (L) 4.22 - 5.81 MIL/uL   Hemoglobin 10.8 (L) 13.0 - 17.0 g/dL   HCT 32.8 (L) 39.0 - 52.0 %   MCV 91.6 80.0 - 100.0 fL   MCH 30.2 26.0 - 34.0 pg   MCHC 32.9 30.0 - 36.0 g/dL   RDW 13.8 11.5 - 15.5 %   Platelets 241 150 - 400 K/uL   nRBC 0.0 0.0 - 0.2 %   Neutrophils Relative % 89 %   Neutro Abs 18.5 (H) 1.7 - 7.7 K/uL   Lymphocytes Relative 3 %   Lymphs  Abs 0.6 (L) 0.7 - 4.0 K/uL   Monocytes Relative 7 %   Monocytes Absolute 1.3 (H) 0.1 - 1.0 K/uL   Eosinophils Relative 0 %   Eosinophils Absolute 0.0 0.0 - 0.5 K/uL   Basophils Relative 0 %   Basophils Absolute 0.1 0.0 - 0.1 K/uL   WBC Morphology TOXIC GRANULATION    Immature Granulocytes 1 %   Abs Immature Granulocytes 0.14 (H) 0.00 - 0.07 K/uL    Comment: Performed at [facility], [place] [address], [place], [place] [address]  Comprehensive metabolic panel     Status: Abnormal   Collection Time: 03/18/19 11:03 AM  Result Value Ref Range   Sodium 136 135 - 145 mmol/L   Potassium 2.8 (L) 3.5 - 5.1 mmol/L   Chloride 99 98 - 111 mmol/L   CO2 25 22 - 32 mmol/L   Glucose, Bld 116 (H) 70 - 99 mg/dL   BUN 33 (H) 8 - 23 mg/dL   Creatinine, Ser 1.77 (H) 0.61 - 1.24 mg/dL    Calcium 7.6 (L) 8.9 - 10.3 mg/dL   Total Protein 5.8 (L) 6.5 - 8.1 g/dL   Albumin 2.7 (L) 3.5 - 5.0 g/dL   AST 17 15 - 41 U/L   ALT 14 0 - 44 U/L   Alkaline Phosphatase 87 38 - 126 U/L   Total Bilirubin 1.7 (H) 0.3 - 1.2 mg/dL   GFR calc non Af Amer 34 (L) >60 mL/min   GFR calc Af Amer 39 (L) >60 mL/min   Anion gap 12 5 - 15    Comment: Performed at [facility], [place] [address], [place], [place] [address]  Lipase, [name]     Status: None   Collection Time: 03/18/19 11:03 AM  Result Value Ref Range   Lipase 20 11 - 51 U/L    Comment: Performed at [facility], [place] [address] [place], [place] [address]  Urinalysis, Routine w reflex microscopic     Status: Abnormal   Collection Time: 03/18/19 11:03 AM  Result Value Ref Range   Color, Urine AMBER (A) YELLOW    Comment: BIOCHEMICALS MAY BE AFFECTED BY COLOR   APPearance HAZY (A) CLEAR   Specific Gravity, Urine 1.019 1.005 - 1.030   pH 5.0 5.0 - 8.0   Glucose, UA NEGATIVE NEGATIVE mg/dL   Hgb urine dipstick SMALL (A) NEGATIVE   Bilirubin Urine NEGATIVE NEGATIVE   Ketones, ur 5 (A) NEGATIVE mg/dL   Protein, ur NEGATIVE NEGATIVE mg/dL   Nitrite NEGATIVE NEGATIVE   Leukocytes,Ua TRACE (A) NEGATIVE   RBC / HPF 6-10 0 - 5 RBC/hpf   WBC, UA 6-10 0 - 5 WBC/hpf   Bacteria, UA RARE (A) NONE SEEN   Squamous Epithelial / LPF 0-5 0 - 5   Mucus PRESENT    Hyaline Casts, UA PRESENT     Comment: Performed at [facility], [place] [address], [place],  [address]   Ct Abdomen Pelvis W Contrast  Result Date: 03/18/2019 CLINICAL DATA:  Generalized abdominal pain with weakness and diarrhea. Recent Clostridium difficile infection. Diverticulitis suspected. EXAM: CT ABDOMEN AND PELVIS WITH CONTRAST TECHNIQUE: Multidetector CT imaging of the abdomen and pelvis was performed using the standard protocol following bolus administration of intravenous contrast. CONTRAST:  30m OMNIPAQUE IOHEXOL 300  MG/ML  SOLN COMPARISON:  Abdominopelvic CT 08/16/2014.  MRCP 09/20/2015. FINDINGS: Lower chest: There are new patchy dependent opacities at both lung bases, likely atelectasis. There is  a small right pleural effusion. The heart is enlarged post median sternotomy. There is atherosclerosis of the aorta and coronary arteries and prominent calcifications of the aortic valve. Hepatobiliary: Cystic lesion peripherally in the left hepatic lobe measuring 3.6 cm on image 21/2 is similar to the previous study. There is similar pneumobilia with intra and extrahepatic biliary dilatation. The common hepatic duct measures up to 1.6 cm in diameter. No new hepatic lesions identified. Pancreas: Stable mild dilatation of the pancreatic duct. No focal pancreatic abnormality or surrounding inflammation. Spleen: Normal in size without focal abnormality. Adrenals/Urinary Tract: Both adrenal glands appear normal. Similar dominant cyst involving the upper pole of the right kidney, bilateral nephrolithiasis and bilateral renal sinus cysts. The contrast bolus is suboptimal, and no significant contrast excretion is identified on early delayed post-contrast images through the kidneys. There is no hydronephrosis or evidence of ureteral calculus. The bladder appears normal. Stomach/Bowel: No enteric contrast was administered. The stomach is decompressed. Small bowel appears unremarkable. There is moderate diffuse colonic wall thickening consistent with colitis. Underlying diverticular changes are present within the descending and sigmoid colon, and the wall thickening in this area appears slightly greater. No evidence of bowel perforation, obstruction or abscess. Vascular/Lymphatic: There are no enlarged abdominal or pelvic lymph nodes. Moderate aortic and branch vessel atherosclerosis. No acute vascular findings. Reproductive: The prostate gland is moderately enlarged, but stable. Other: Stable subxiphoid hernia containing fat.  No ascites.  Musculoskeletal: No acute osseous findings. Stable multilevel spondylosis associated with a convex right scoliosis. IMPRESSION: 1. Diffuse colonic wall thickening and mild surrounding inflammation consistent with pancolitis, presumably from the patient's known Clostridium difficile colitis. There are underlying moderate diverticular changes in the sigmoid colon, and the wall thickening in this area is slightly greater, potentially due to superimposed diverticulitis. 2. No evidence of bowel obstruction, perforation or abscess. 3. Stable chronic biliary dilatation and pneumobilia. 4. New small right pleural effusion and probable bibasilar atelectasis. 5. Stable additional incidental findings including calcifications of the aortic valve, hepatic and renal cysts, bilateral nephrolithiasis, a ventral abdominal wall hernia, lumbar spondylosis and Aortic Atherosclerosis (ICD10-I70.0). Electronically Signed   By: Richardean Sale M.D.   On: 03/18/2019 13:20    Pending Labs Unresulted Labs (From admission, onward)    Start     Ordered   03/18/19 1216  SARS CORONAVIRUS 2 Nasal Swab Aptima Multi Swab  (Asymptomatic/Tier 2 Patients Labs)  Once,   STAT    Question Answer Comment  Is this test for diagnosis or screening Screening   Symptomatic for COVID-19 as defined by CDC No   Hospitalized for COVID-19 No   Admitted to ICU for COVID-19 No   Previously tested for COVID-19 Yes   Resident in a congregate (group) care setting Yes   Employed in healthcare setting No      03/18/19 1215          Vitals/Pain Today's Vitals   03/18/19 1014 03/18/19 1016 03/18/19 1330 03/18/19 1430  BP: 107/63  (!) 112/51 103/76  Pulse: 91   66  Resp: 18  18 19   Temp: 98.8 F (37.1 C)     TempSrc: Oral     SpO2: 90%   90%  Weight:  73 kg    Height:  6' 2"  (1.88 m)      Isolation Precautions Enteric precautions (UV disinfection)  Medications Medications  sodium chloride (PF) 0.9 % injection (has no administration in  time range)  vancomycin (VANCOCIN) 50 mg/mL oral solution 125 mg (  125 mg Oral Given 03/18/19 1457)    Followed by  vancomycin (VANCOCIN) 50 mg/mL oral solution 125 mg (has no administration in time range)    Followed by  vancomycin (VANCOCIN) 50 mg/mL oral solution 125 mg (has no administration in time range)    Followed by  vancomycin (VANCOCIN) 50 mg/mL oral solution 125 mg (has no administration in time range)    Followed by  vancomycin (VANCOCIN) 50 mg/mL oral solution 125 mg (has no administration in time range)  potassium chloride 10 mEq in 100 mL IVPB (10 mEq Intravenous New Bag/Given 03/18/19 1456)  metroNIDAZOLE (FLAGYL) IVPB 500 mg (500 mg Intravenous New Bag/Given 03/18/19 1456)  sodium chloride 0.9 % bolus 1,000 mL (0 mLs Intravenous Stopped 03/18/19 1433)  potassium chloride SA (K-DUR) CR tablet 40 mEq (40 mEq Oral Given 03/18/19 1348)  potassium chloride 10 mEq in 100 mL IVPB (0 mEq Intravenous Stopped 03/18/19 1456)  iohexol (OMNIPAQUE) 300 MG/ML solution 75 mL (75 mLs Intravenous Contrast Given 03/18/19 1237)    Mobility walks

## 2019-03-18 NOTE — Telephone Encounter (Signed)
immodium

## 2019-03-18 NOTE — ED Notes (Signed)
Pt's o2 sats 85% on RA. Pt placed on 2 L [place].

## 2019-03-18 NOTE — H&P (Signed)
History and Physical  [name] [name]:[id number] DOB: March 07, 1930 DOA: 03/18/2019  Referring physician: EDP PCP: [name] Lung, MD   Chief Complaint: diarrhea for two weeks, left lower abdominal pain, no appetite, weakness  HPI: [name] [name] is a 85 y.o. male   H/o HTN, HLD, BPH, h/o C diff x2 treated with 2weeks of vanc each time, presents with 2 weeks of diarrhea, left lower abdominal pain, poor appetite, weakness, no vomiting, no fever, no cough, no sob.  ED course: vital signs are stable cdiff + CT ab/pel with pancolitis EKG sinus arrhythmia, Qtc 511 Wbc 20.6 with toxic granulation, hgb 10.8,  k, 2.8, bun 33, cr 1.77, tbili 1.7, albumin 2.7  He is started on oral vanc, hospitalist called to admit the patient.  SARS COV2 collected, result pending  Review of Systems:  Detail per HPI, Review of systems are otherwise negative  Past Medical History:  Diagnosis Date   Arthritis    BPH (benign prostatic hyperplasia)    C. difficile colitis 04/20/2018   CAD (coronary artery disease)    s/p CABG x 1   DJD (degenerative joint disease)    Gallstones    HTN (hypertension)    Hyperlipidemia    Lumbar disc disease    MVP (mitral valve prolapse)    s/p Mitral Valve Repair   Pancreatitis    Wandering (atrial) pacemaker 11/22/2013   with frequent multifocal ectopy (PACs, PVCs, junctional escape beats   Past Surgical History:  Procedure Laterality Date   CHOLECYSTECTOMY     Dr. Lennie [name]   COLONOSCOPY  ~2008   no polyps per pt.  Dr. Doretha [name] [facility]   CORONARY ARTERY BYPASS GRAFT  January 2002   Single bypass to the distal RCA   ERCP N/A 02/22/2013   Procedure: ENDOSCOPIC RETROGRADE CHOLANGIOPANCREATOGRAPHY (ERCP);  Surgeon: [name] Castle, MD;  Location: [facility];  Service: Endoscopy;  Laterality: N/A;   ERCP N/A 09/21/2015   Procedure: ENDOSCOPIC RETROGRADE CHOLANGIOPANCREATOGRAPHY (ERCP);  Surgeon: [name] Mayer, MD;  Location: [facility] ENDOSCOPY;   Service: Endoscopy;  Laterality: N/A;  with spyglass   ERCP N/A 03/04/2019   Procedure: ENDOSCOPIC RETROGRADE CHOLANGIOPANCREATOGRAPHY (ERCP);  Surgeon: [name] Mayer, MD;  Location: [facility];  Service: Endoscopy;  Laterality: N/A;   HERNIA REPAIR  05/05/11   Lap L inguinal & obturator herniae, R Femoral Hernia   MITRAL VALVE REPAIR  January 2002   REMOVAL OF STONES  03/04/2019   Procedure: REMOVAL OF STONES;  Surgeon: [name] Mayer, MD;  Location: [facility] ENDOSCOPY;  Service: Endoscopy;;   SPYGLASS CHOLANGIOSCOPY N/A 02/22/2013   Procedure: PPJKDTOI CHOLANGIOSCOPY;  Surgeon: [name] Castle, MD;  Location: [facility] ENDOSCOPY;  Service: Endoscopy;  Laterality: N/A;   SPYGLASS CHOLANGIOSCOPY N/A 09/21/2015   Procedure: ZTIWPYKD CHOLANGIOSCOPY;  Surgeon: [name] Mayer, MD;  Location: [facility];  Service: Endoscopy;  Laterality: N/A;   SPYGLASS LITHOTRIPSY N/A 02/22/2013   Procedure: XIPJASNK LITHOTRIPSY;  Surgeon: [name] Castle, MD;  Location: [facility]OSCOPY;  Service: Endoscopy;  Laterality: N/A;  litho ord'd 7/8 by DL/PO [id number] [facility]   UPPER GASTROINTESTINAL ENDOSCOPY  02/22/13   Social History:  reports that he has never smoked. He has never used smokeless tobacco. He reports current alcohol use of about 1.0 - 2.0 standard drinks of alcohol per week. He reports that he does not use drugs. Patient lives at home & is able to participate in activities of daily living independently   No Known Allergies  Family History  Problem Relation Age of Onset   Stroke Father    Stroke Mother        TIA   Esophageal cancer Neg Hx       Prior to Admission medications   Medication Sig Start Date End Date Taking? Authorizing Provider  acetaminophen (TYLENOL) 500 MG tablet Take 1,000 mg by mouth every 6 (six) hours as needed for moderate pain.    Yes [provider]  aspirin 81 MG tablet Take 81 mg by mouth daily with supper.    Yes [provider]  b complex vitamins tablet  Take 1 tablet by mouth daily.   Yes [provider]  Cholecalciferol (VITAMIN D) 1000 UNITS capsule Take 1,000 Units by mouth daily.     Yes [provider]  dutasteride (AVODART) 0.5 MG capsule Take 0.5 mg by mouth every evening.    Yes [provider]  famotidine (PEPCID) 20 MG tablet Take 20 mg by mouth daily as needed for heartburn or indigestion.   Yes [provider]  gabapentin (NEURONTIN) 300 MG capsule Take 300 mg by mouth 4 (four) times daily.   Yes [provider]  GLUCOSAMINE-CHONDROITIN PO Take 1 tablet by mouth 2 (two) times a day.    Yes [provider]  HYDROcodone-acetaminophen (NORCO/VICODIN) 5-325 MG tablet Take 1 tablet by mouth every 6 (six) hours as needed for moderate pain.  05/15/16  Yes [provider]  MYRBETRIQ 50 MG TB24 tablet Take 1 tablet by mouth daily with supper.  09/03/16  Yes [provider]  Polyethyl Glycol-Propyl Glycol (SYSTANE OP) Place 1 drop into both eyes daily as needed (dry eyes).   Yes [provider]  rosuvastatin (CRESTOR) 20 MG tablet Take 1 tablet (20 mg total) by mouth daily. Patient taking differently: Take 20 mg by mouth daily with supper.  01/24/19  Yes [name] Lung, MD  Simethicone (GAS-X PO) Take 1 tablet by mouth daily as needed (gas).    Yes [provider]  Tamsulosin HCl (FLOMAX) 0.4 MG CAPS Take 0.4 mg by mouth every evening.    Yes [provider]  valsartan-hydrochlorothiazide (DIOVAN-HCT) 320-12.5 MG tablet Take 1 tablet by mouth every morning. 01/25/19  Yes [name], [name] M, MD  Wheat Dextrin (BENEFIBER PO) Take 1 Dose by mouth daily.   Yes [provider]  amLODipine (NORVASC) 5 MG tablet take 1 tablet by mouth once daily every evening Patient not taking: Reported on 03/18/2019 11/19/18   [name], [name] M, MD    Physical Exam: BP 107/63 (BP Location: Left Arm)    Pulse 91    Temp 98.8 F (37.1 C) (Oral)    Resp 18    Ht 6' 2"   (1.88 m)    Wt 73 kg    SpO2 90%    BMI 20.67 kg/m    General:  Alert, oriented, very weak  Eyes: PERRL  ENT: unremarkable  Neck: supple, no JVD  Cardiovascular: irregular  Respiratory: CTABL  Abdomen: mild tender left lower quadrant, no guarding, no rebound,  positive bowel sounds  Skin: no rash  Musculoskeletal:  No edema  Psychiatric: calm/cooperative  Neurologic: no focal findings            Labs on Admission:  Basic Metabolic Panel: Recent Labs  Lab 03/15/19 1200 03/18/19 1103  NA 141 136  K 3.5 2.8*  CL 100 99  CO2 24 25  GLUCOSE 109* 116*  BUN 20 33*  CREATININE  1.13 1.77*  CALCIUM 8.0* 7.6*   Liver Function Tests: Recent Labs  Lab 03/15/19 1200 03/18/19 1103  AST 15 17  ALT 16 14  ALKPHOS 119* 87  BILITOT 0.9 1.7*  PROT 5.6* 5.8*  ALBUMIN 3.3* 2.7*   Recent Labs  Lab 03/18/19 1103  LIPASE 20   No results for input(s): AMMONIA in the last 168 hours. CBC: Recent Labs  Lab 03/15/19 1200 03/18/19 1103  WBC 10.1 20.6*  NEUTROABS 8.5* 18.5*  HGB 10.9* 10.8*  HCT 33.2* 32.8*  MCV 89 91.6  PLT 274 241   Cardiac Enzymes: No results for input(s): CKTOTAL, CKMB, CKMBINDEX, TROPONINI in the last 168 hours.  BNP (last 3 results) No results for input(s): BNP in the last 8760 hours.  ProBNP (last 3 results) No results for input(s): PROBNP in the last 8760 hours.  CBG: No results for input(s): GLUCAP in the last 168 hours.  Radiological Exams on Admission: Ct Abdomen Pelvis W Contrast  Result Date: 03/18/2019 CLINICAL DATA:  Generalized abdominal pain with weakness and diarrhea. Recent Clostridium difficile infection. Diverticulitis suspected. EXAM: CT ABDOMEN AND PELVIS WITH CONTRAST TECHNIQUE: Multidetector CT imaging of the abdomen and pelvis was performed using the standard protocol following bolus administration of intravenous contrast. CONTRAST:  87m OMNIPAQUE IOHEXOL 300 MG/ML  SOLN COMPARISON:  Abdominopelvic CT 08/16/2014.  MRCP  09/20/2015. FINDINGS: Lower chest: There are new patchy dependent opacities at both [name] bases, likely atelectasis. There is a small right pleural effusion. The heart is enlarged post median sternotomy. There is atherosclerosis of the aorta and coronary arteries and prominent calcifications of the aortic valve. Hepatobiliary: Cystic lesion peripherally in the left hepatic lobe measuring 3.6 cm on image 21/2 is similar to the previous study. There is similar pneumobilia with intra and extrahepatic biliary dilatation. The common hepatic duct measures up to 1.6 cm in diameter. No new hepatic lesions identified. Pancreas: Stable mild dilatation of the pancreatic duct. No focal pancreatic abnormality or surrounding inflammation. Spleen: Normal in size without focal abnormality. Adrenals/Urinary Tract: Both adrenal glands appear normal. Similar dominant cyst involving the upper pole of the right kidney, bilateral nephrolithiasis and bilateral renal sinus cysts. The contrast bolus is suboptimal, and no significant contrast excretion is identified on early delayed post-contrast images through the kidneys. There is no hydronephrosis or evidence of ureteral calculus. The bladder appears normal. Stomach/Bowel: No enteric contrast was administered. The stomach is decompressed. Small bowel appears unremarkable. There is moderate diffuse colonic wall thickening consistent with colitis. Underlying diverticular changes are present within the descending and sigmoid colon, and the wall thickening in this area appears slightly greater. No evidence of bowel perforation, obstruction or abscess. Vascular/Lymphatic: There are no enlarged abdominal or pelvic lymph nodes. Moderate aortic and branch vessel atherosclerosis. No acute vascular findings. Reproductive: The prostate gland is moderately enlarged, but stable. Other: Stable subxiphoid hernia containing fat.  No ascites. Musculoskeletal: No acute osseous findings. Stable multilevel  spondylosis associated with a convex right scoliosis. IMPRESSION: 1. Diffuse colonic wall thickening and mild surrounding inflammation consistent with pancolitis, presumably from the patient's known Clostridium difficile colitis. There are underlying moderate diverticular changes in the sigmoid colon, and the wall thickening in this area is slightly greater, potentially due to superimposed diverticulitis. 2. No evidence of bowel obstruction, perforation or abscess. 3. Stable chronic biliary dilatation and pneumobilia. 4. New small right pleural effusion and probable bibasilar atelectasis. 5. Stable additional incidental findings including calcifications of the aortic valve, hepatic and renal cysts, bilateral  nephrolithiasis, a ventral abdominal wall hernia, lumbar spondylosis and Aortic Atherosclerosis (ICD10-I70.0). Electronically Signed   By: Richardean Sale M.D.   On: 03/18/2019 13:20     Assessment/Plan Present on Admission:  C. difficile colitis  Recurrent cdiff colitis/pan colitis -start oral vanc plus iv flagyl/ivf, full liquid diet, GI consult  Hypokalemia -replace k, keep on tele, check mag  AKI Likely prerenal from dehydration Hold home meds valsartan/HCTZ Renal dosing meds  HTN bp normal, hold bp meds in the setting of dehydration /colitis   DVT prophylaxis: heparin   Consultants: GI  Code Status: full   Family Communication:  Patient   Disposition Plan: admit to med tele  Time spent: 49mns  FFlorencia ReasonsMD, PhD, FACP [facility] Pager [phone] 7PM-7AM, please contact night-coverage at [email or web address], password [email or web address]

## 2019-03-18 NOTE — ED Notes (Signed)
Pt now maintaining O2 sats of mid 90's on RA.

## 2019-03-18 NOTE — Telephone Encounter (Signed)
Then she is going need to call GI and found out what their recommendation is

## 2019-03-19 ENCOUNTER — Encounter (HOSPITAL_COMMUNITY): Payer: Self-pay | Admitting: *Deleted

## 2019-03-19 ENCOUNTER — Other Ambulatory Visit: Payer: Self-pay

## 2019-03-19 DIAGNOSIS — K5100 Ulcerative (chronic) pancolitis without complications: Secondary | ICD-10-CM

## 2019-03-19 LAB — CBC
HCT: 33.9 % — ABNORMAL LOW (ref 39.0–52.0)
Hemoglobin: 10.6 g/dL — ABNORMAL LOW (ref 13.0–17.0)
MCH: 29 pg (ref 26.0–34.0)
MCHC: 31.3 g/dL (ref 30.0–36.0)
MCV: 92.9 fL (ref 80.0–100.0)
Platelets: 231 10*3/uL (ref 150–400)
RBC: 3.65 MIL/uL — ABNORMAL LOW (ref 4.22–5.81)
RDW: 13.5 % (ref 11.5–15.5)
WBC: 17.3 10*3/uL — ABNORMAL HIGH (ref 4.0–10.5)
nRBC: 0 % (ref 0.0–0.2)

## 2019-03-19 LAB — COMPREHENSIVE METABOLIC PANEL
ALT: 15 U/L (ref 0–44)
AST: 17 U/L (ref 15–41)
Albumin: 2.3 g/dL — ABNORMAL LOW (ref 3.5–5.0)
Alkaline Phosphatase: 80 U/L (ref 38–126)
Anion gap: 10 (ref 5–15)
BUN: 28 mg/dL — ABNORMAL HIGH (ref 8–23)
CO2: 20 mmol/L — ABNORMAL LOW (ref 22–32)
Calcium: 7.4 mg/dL — ABNORMAL LOW (ref 8.9–10.3)
Chloride: 107 mmol/L (ref 98–111)
Creatinine, Ser: 1.15 mg/dL (ref 0.61–1.24)
GFR calc Af Amer: >60 mL/min (ref >60)
GFR calc non Af Amer: 57 mL/min — ABNORMAL LOW (ref >60)
Glucose, Bld: 98 mg/dL (ref 70–99)
Potassium: 3.1 mmol/L — ABNORMAL LOW (ref 3.5–5.1)
Sodium: 137 mmol/L (ref 135–145)
Total Bilirubin: 1.2 mg/dL (ref 0.3–1.2)
Total Protein: 5.1 g/dL — ABNORMAL LOW (ref 6.5–8.1)

## 2019-03-19 LAB — MAGNESIUM: Magnesium: 1.6 mg/dL — ABNORMAL LOW (ref 1.7–2.4)

## 2019-03-19 MED ORDER — MAGNESIUM SULFATE 2 GM/50ML IV SOLN
2.0000 g | Freq: Once | INTRAVENOUS | Status: AC
  Administered 2019-03-19: 2 g via INTRAVENOUS
  Filled 2019-03-19: qty 50

## 2019-03-19 MED ORDER — ENOXAPARIN SODIUM 40 MG/0.4ML ~~LOC~~ SOLN
40.0000 mg | SUBCUTANEOUS | Status: DC
  Administered 2019-03-19 – 2019-03-29 (×11): 40 mg via SUBCUTANEOUS
  Filled 2019-03-19 (×11): qty 0.4

## 2019-03-19 MED ORDER — TRAZODONE HCL 50 MG PO TABS
50.0000 mg | ORAL_TABLET | Freq: Once | ORAL | Status: AC
  Administered 2019-03-19: 23:00:00 50 mg via ORAL
  Filled 2019-03-19: qty 1

## 2019-03-19 MED ORDER — POTASSIUM CHLORIDE CRYS ER 20 MEQ PO TBCR
40.0000 meq | EXTENDED_RELEASE_TABLET | Freq: Once | ORAL | Status: AC
  Administered 2019-03-19: 40 meq via ORAL
  Filled 2019-03-19: qty 2

## 2019-03-19 NOTE — Progress Notes (Signed)
[facility] ROUNDING NOTE   Subjective: Rectal tube dislodged o/n and replaced. Otherwise, no a/e. Tolerated some PO intake this AM. Otherwise, afebrile, no abdominal pain. Still with copious liquid output via rectal tube and to commode x1. No hematochezia.   WBC downtrending to 17 from 20. Creat improved to 1.15 from 1.77. K improved to 3.1. COVID test negative.   Objective: Vital signs in last 24 hours: Temp:  [98.6 F (37 C)-99 F (37.2 C)] 99 F (37.2 C) (08/07 2335) Pulse Rate:  [55-91] 78 (08/07 2335) Resp:  [18-20] 20 (08/07 2335) BP: (101-133)/(51-79) 101/79 (08/07 2335) SpO2:  [90 %-96 %] 92 % (08/07 2335) Weight:  [73 kg] 73 kg (08/07 1016) Last BM Date: 03/18/19 General: NAD Abdomen: Soft, NT, ND Ext: No c/c/e   Intake/Output from previous day: 08/07 0701 - 08/08 0700 In: 1200 [IV Piggyback:1200] Out: -  Intake/Output this shift: Total I/O In: 240 [P.O.:240] Out: -    Lab Results: Recent Labs    03/18/19 1103 03/19/19 0519  WBC 20.6* 17.3*  HGB 10.8* 10.6*  PLT 241 231  MCV 91.6 92.9   BMET Recent Labs    03/18/19 1103 03/19/19 0519  NA 136 137  K 2.8* 3.1*  CL 99 107  CO2 25 20*  GLUCOSE 116* 98  BUN 33* 28*  CREATININE 1.77* 1.15  CALCIUM 7.6* 7.4*   LFT Recent Labs    03/18/19 1103 03/19/19 0519  PROT 5.8* 5.1*  ALBUMIN 2.7* 2.3*  AST 17 17  ALT 14 15  ALKPHOS 87 80  BILITOT 1.7* 1.2   PT/INR No results for input(s): INR in the last 72 hours.    Imaging/Other results: Ct Abdomen Pelvis W Contrast  Result Date: 03/18/2019 CLINICAL DATA:  Generalized abdominal pain with weakness and diarrhea. Recent Clostridium difficile infection. Diverticulitis suspected. EXAM: CT ABDOMEN AND PELVIS WITH CONTRAST TECHNIQUE: Multidetector CT imaging of the abdomen and pelvis was performed using the standard protocol following bolus administration of intravenous contrast. CONTRAST:  65m OMNIPAQUE IOHEXOL 300 MG/ML  SOLN  COMPARISON:  Abdominopelvic CT 08/16/2014.  MRCP 09/20/2015. FINDINGS: Lower chest: There are new patchy dependent opacities at both lung bases, likely atelectasis. There is a small right pleural effusion. The heart is enlarged post median sternotomy. There is atherosclerosis of the aorta and coronary arteries and prominent calcifications of the aortic valve. Hepatobiliary: Cystic lesion peripherally in the left hepatic lobe measuring 3.6 cm on image 21/2 is similar to the previous study. There is similar pneumobilia with intra and extrahepatic biliary dilatation. The common hepatic duct measures up to 1.6 cm in diameter. No new hepatic lesions identified. Pancreas: Stable mild dilatation of the pancreatic duct. No focal pancreatic abnormality or surrounding inflammation. Spleen: Normal in size without focal abnormality. Adrenals/Urinary Tract: Both adrenal glands appear normal. Similar dominant cyst involving the upper pole of the right kidney, bilateral nephrolithiasis and bilateral renal sinus cysts. The contrast bolus is suboptimal, and no significant contrast excretion is identified on early delayed post-contrast images through the kidneys. There is no hydronephrosis or evidence of ureteral calculus. The bladder appears normal. Stomach/Bowel: No enteric contrast was administered. The stomach is decompressed. Small bowel appears unremarkable. There is moderate diffuse colonic wall thickening consistent with colitis. Underlying diverticular changes are present within the descending and sigmoid colon, and the wall thickening in this area appears slightly greater. No evidence of bowel perforation, obstruction or abscess. Vascular/Lymphatic: There are no enlarged abdominal or pelvic lymph nodes. Moderate aortic and branch  vessel atherosclerosis. No acute vascular findings. Reproductive: The prostate gland is moderately enlarged, but stable. Other: Stable subxiphoid hernia containing fat.  No ascites.  Musculoskeletal: No acute osseous findings. Stable multilevel spondylosis associated with a convex right scoliosis. IMPRESSION: 1. Diffuse colonic wall thickening and mild surrounding inflammation consistent with pancolitis, presumably from the patient's known Clostridium difficile colitis. There are underlying moderate diverticular changes in the sigmoid colon, and the wall thickening in this area is slightly greater, potentially due to superimposed diverticulitis. 2. No evidence of bowel obstruction, perforation or abscess. 3. Stable chronic biliary dilatation and pneumobilia. 4. New small right pleural effusion and probable bibasilar atelectasis. 5. Stable additional incidental findings including calcifications of the aortic valve, hepatic and renal cysts, bilateral nephrolithiasis, a ventral abdominal wall hernia, lumbar spondylosis and Aortic Atherosclerosis (ICD10-I70.0). Electronically Signed   By: Richardean Sale M.D.   On: 03/18/2019 13:20      Assessment and Plan:  1) C Diff Infection 2) Diarrhea 3) Pancolitis on CT with questionable superimposed Diverticulitis 4) Elevated TBili 5) HypoK+ 2/2 diarrheal losses  -Resume vancomycin 125 mg qid, with plan for long tapering course x8 to 12 weeks. -Resume IV Flagyl (day 1).  Plan to continue this for now due to possible superimposed diverticulitis along with elevated WBC in his elderly male with pancolitis.  Hope to de-escalate antimicrobial therapy in the near future -Add Florastor when improved p.o. intake -WBC improved today to 17 -Resume daily lytes with repletion prn. Needs repeat K+ repletion today -Resume IVF until improved PO intake -Continue to encourage p.o. intake. On full liquids currently -Daily CBC and electrolytes -TBili improved to 1.2. Suspect mild elevation was 2/2 cholestasis of systemic illness.  Can continue to trend for now.  Otherwise pneumobilia and biliary dilation noted on CT consistent with prior  ERCP/sphincterotomy.  Given normal ALP and normal AST/ALT, do not suspect additional biliary pathology at this time -I discussed his case at length with his son, Dr. Dora Sims (Anesthesiologist in [place], [place]) per patient's request.   -Will continue to follow    Lavena Bullion, DO  03/19/2019, 9:44 AM [facility] Pager [phone]

## 2019-03-19 NOTE — Progress Notes (Signed)
PROGRESS NOTE  [name] [name] VOZ:[id number] DOB: 1930/04/13 DOA: 03/18/2019 PCP: [name], MD  Brief History   85 year old man PMH C. difficile x2 treated with 2 weeks of vancomycin each time, presented with 2 weeks of diarrhea, left-sided abdominal pain, poor appetite, weakness.  CT abdomen pelvis showed pancolitis.  A & P  C. difficile colitis with pancolitis, recurrent, current episode.  Pneumobilia and biliary dilatation seen on CT consistent with prior ERCP, sphincterotomy. --Seen by gastroenterology, plan is for vancomycin oral for long tapering course.  Also started on IV Flagyl for possible superimposed diverticulitis.  AKI. --Resolved with IV fluids.  Continue to hold valsartan, hydrochlorothiazide  Hypokalemia, hypomagnesemia --Replete.  Essential hypertension --Stable.  Valsartan and hydrochlorothiazide on hold.  Aortic atherosclerosis --Follow-up as an outpatient.   Appears stable at this point.  Continue inpatient until stool volume is manageable.  PT evaluation 8/10   DVT prophylaxis: enoxaparin Code Status: Full Family Communication: none Disposition Plan: home    Murray Hodgkins, MD  [facility] Direct contact: see [email or web address] (further directions at bottom of note if needed) 7PM-7AM contact night coverage as at bottom of note 03/19/2019, 8:29 AM  LOS: 1 day   Consultants  . GI  Procedures  .   Antibiotics  . Oral vancomycin 8/7 > . IV Flagyl 8/7 >  Interval History/Subjective  No significant abdominal pain now.  Having diarrhea.  Main complaint at the moment is lack of sleep.  Objective   Vitals:  Vitals:   03/18/19 1723 03/18/19 2335  BP: 133/73 101/79  Pulse: 61 78  Resp: 18 20  Temp: 98.6 F (37 C) 99 F (37.2 C)  SpO2: 96% 92%    Exam:  Constitutional:  . Appears calm and comfortable Respiratory:  . CTA bilaterally, no w/r/r.  . Respiratory effort normal.  Cardiovascular:  . RRR, no m/r/g . No LE extremity  edema   Abdomen:  . Soft, nontender Psychiatric:  . Mental status o Mood, affect appropriate  I have personally reviewed the following:   Today's Data  . Potassium 3.1, magnesium 1.6.  BUN trending down, 28.  Creatinine has normalized 1.15. Marland Kitchen LFTs unremarkable . WBC improved, 17.6.  Hemoglobin stable at 10.6.  Scheduled Meds: . aspirin EC  81 mg Oral Q supper  . B-complex with vitamin C  1 tablet Oral Daily  . cholecalciferol  1,000 Units Oral Daily  . dutasteride  0.5 mg Oral QPM  . heparin  5,000 Units Subcutaneous Q8H  . mirabegron ER  50 mg Oral Q supper  . rosuvastatin  20 mg Oral Q supper  . tamsulosin  0.4 mg Oral QPM  . vancomycin  125 mg Oral QID   Followed by  . [START ON 04/01/2019] vancomycin  125 mg Oral BID   Followed by  . [START ON 04/09/2019] vancomycin  125 mg Oral Daily   Followed by  . [START ON 04/16/2019] vancomycin  125 mg Oral QODAY   Followed by  . [START ON 05/14/2019] vancomycin  125 mg Oral Q3 days   Continuous Infusions: . sodium chloride 75 mL/hr at 03/19/19 0201  . metronidazole 500 mg (03/19/19 4259)    Active Problems:   C. difficile colitis   Hypokalemia   Acute kidney injury ([place])   Pancolitis ([place])   LOS: 1 day   How to contact the [facility] Attending or Consulting provider [place] or covering provider during after hours [place], for this patient?  1. Check the  care team in [facility] and look for a) attending/consulting [place] provider listed and b) the [facility] team listed 2. Log into [email or web address] and use [place]'s universal password to access. If you do not have the password, please contact the hospital operator. 3. Locate the [facility] provider you are looking for under [facility] and page to a number that you can be directly reached. 4. If you still have difficulty reaching the provider, please page the [facility] (Director on Call) for the Hospitalists listed on amion for assistance.

## 2019-03-20 LAB — COMPREHENSIVE METABOLIC PANEL
ALT: 13 U/L (ref 0–44)
AST: 14 U/L — ABNORMAL LOW (ref 15–41)
Albumin: 2.1 g/dL — ABNORMAL LOW (ref 3.5–5.0)
Alkaline Phosphatase: 65 U/L (ref 38–126)
Anion gap: 6 (ref 5–15)
BUN: 23 mg/dL (ref 8–23)
CO2: 22 mmol/L (ref 22–32)
Calcium: 7.5 mg/dL — ABNORMAL LOW (ref 8.9–10.3)
Chloride: 111 mmol/L (ref 98–111)
Creatinine, Ser: 0.93 mg/dL (ref 0.61–1.24)
GFR calc Af Amer: >60 mL/min (ref >60)
GFR calc non Af Amer: >60 mL/min (ref >60)
Glucose, Bld: 107 mg/dL — ABNORMAL HIGH (ref 70–99)
Potassium: 3.2 mmol/L — ABNORMAL LOW (ref 3.5–5.1)
Sodium: 139 mmol/L (ref 135–145)
Total Bilirubin: 0.6 mg/dL (ref 0.3–1.2)
Total Protein: 4.6 g/dL — ABNORMAL LOW (ref 6.5–8.1)

## 2019-03-20 LAB — MAGNESIUM: Magnesium: 2 mg/dL (ref 1.7–2.4)

## 2019-03-20 LAB — PHOSPHORUS: Phosphorus: 2.9 mg/dL (ref 2.5–4.6)

## 2019-03-20 MED ORDER — TRAZODONE HCL 50 MG PO TABS
50.0000 mg | ORAL_TABLET | Freq: Every evening | ORAL | Status: DC | PRN
  Administered 2019-03-21 – 2019-03-28 (×9): 50 mg via ORAL
  Filled 2019-03-20 (×9): qty 1

## 2019-03-20 MED ORDER — SACCHAROMYCES BOULARDII 250 MG PO CAPS
250.0000 mg | ORAL_CAPSULE | Freq: Two times a day (BID) | ORAL | Status: DC
  Administered 2019-03-20 – 2019-03-31 (×22): 250 mg via ORAL
  Filled 2019-03-20 (×22): qty 1

## 2019-03-20 MED ORDER — POTASSIUM CHLORIDE 10 MEQ/100ML IV SOLN
10.0000 meq | INTRAVENOUS | Status: AC
  Administered 2019-03-20 (×4): 10 meq via INTRAVENOUS
  Filled 2019-03-20 (×2): qty 100

## 2019-03-20 NOTE — Progress Notes (Addendum)
PROGRESS NOTE  [name] [name] ZYS:[id number] DOB: 1930/06/14 DOA: 03/18/2019 PCP: [name], MD  Brief History   85 year old man PMH C. difficile x2 treated with 2 weeks of vancomycin each time, presented with 2 weeks of diarrhea, left-sided abdominal pain, poor appetite, weakness.  CT abdomen pelvis showed pancolitis.  A & P  C. difficile colitis with pancolitis, recurrent, current episode.  Pneumobilia and biliary dilatation seen on CT consistent with prior ERCP, sphincterotomy.  Of note patient is status post ERCP 02/2019 --Followed by gastroenterology.  Plan is for long vancomycin taper.  GI also treating with Flagyl secondary to the possibility of concomitant diverticulitis.  AKI. --Resolved with IV fluids.  Continue to hold valsartan, hydrochlorothiazide  Hypokalemia --Replete.  Essential hypertension --Remains stable.  Valsartan and hydrochlorothiazide on hold.  Aortic atherosclerosis --Follow-up as an outpatient.   Continues to have significant diarrhea.  No opportunity for discharge yet.  Once stool is more formed, DC Flexi-Seal and monitor for stability.  Continue oral vancomycin.  Defer to GI and timing of IV Flagyl for possible concomitant diverticulitis.   DVT prophylaxis: enoxaparin Code Status: Full Family Communication: none Disposition Plan: home    Murray Hodgkins, MD  [facility] Direct contact: see [email or web address] (further directions at bottom of note if needed) 7PM-7AM contact night coverage as at bottom of note 03/20/2019, 3:19 PM  LOS: 2 days   Consultants  . GI  Procedures  .   Antibiotics  . Oral vancomycin 8/7 > . IV Flagyl 8/7 >  Interval History/Subjective  Overall feels okay, still having diarrhea, tolerating diet, and enjoying coffee more than food.  Objective   Vitals:  Vitals:   03/19/19 2137 03/20/19 0606  BP: (!) 154/76 (!) 141/62  Pulse: (!) 56 62  Resp: 16 17  Temp: 98.5 F (36.9 C) 97.7 F (36.5 C)  SpO2: 95% 96%     Exam:  Constitutional:   . Appears calm and comfortable Respiratory:  . CTA bilaterally, no w/r/r.  . Respiratory effort normal. Cardiovascular:  . RRR, no m/r/g . No LE extremity edema   Abdomen:  . Positive bowel sounds, soft, nontender, nondistended Psychiatric:  . Mental status o Mood, affect appropriate  I have personally reviewed the following:   Today's Data  . Potassium 3.2, remainder BMP unremarkable.  Phosphorus and magnesium within normal limits.  LFTs unremarkable.  Scheduled Meds: . aspirin EC  81 mg Oral Q supper  . B-complex with vitamin C  1 tablet Oral Daily  . cholecalciferol  1,000 Units Oral Daily  . dutasteride  0.5 mg Oral QPM  . enoxaparin (LOVENOX) injection  40 mg Subcutaneous Q24H  . mirabegron ER  50 mg Oral Q supper  . rosuvastatin  20 mg Oral Q supper  . tamsulosin  0.4 mg Oral QPM  . vancomycin  125 mg Oral QID   Followed by  . [START ON 04/01/2019] vancomycin  125 mg Oral BID   Followed by  . [START ON 04/09/2019] vancomycin  125 mg Oral Daily   Followed by  . [START ON 04/16/2019] vancomycin  125 mg Oral QODAY   Followed by  . [START ON 05/14/2019] vancomycin  125 mg Oral Q3 days   Continuous Infusions: . metronidazole 500 mg (03/20/19 1337)    Principal Problem:   C. difficile colitis Active Problems:   Hypokalemia   Acute kidney injury ([place])   Pancolitis ([place])   LOS: 2 days   How to contact the [facility] Attending or  Consulting provider [place] or covering provider during after hours [place], for this patient?  1. Check the care team in [facility] and look for a) attending/consulting [facility] provider listed and b) the [facility] team listed 2. Log into [email or web address] and use [place]'s universal password to access. If you do not have the password, please contact the hospital operator. 3. Locate the [facility] provider you are looking for under [facility] and page to a number that you can be directly reached. 4. If you still have difficulty reaching  the provider, please page the [facility] (Director on Call) for the Hospitalists listed on amion for assistance.

## 2019-03-20 NOTE — Progress Notes (Signed)
[place] [facility] ROUNDING NOTE   Subjective: No acute events overnight.  Improved p.o. intake, now tolerating soft diet without issue.  Rectal tube still in place, still with loose stools. + Ten[place]mus.  Renal function preserved, albumin 2.1.  No CBC for review today.  Objective: Vital signs in last 24 hours: Temp:  [97.7 F (36.5 C)-98.5 F (36.9 C)] 97.7 F (36.5 C) (08/09 0606) Pulse Rate:  [56-62] 62 (08/09 0606) Resp:  [16-17] 17 (08/09 0606) BP: (141-154)/(62-76) 141/62 (08/09 0606) SpO2:  [95 %-96 %] 96 % (08/09 0606) Last BM Date: 03/19/19 General: NAD Abdomen: Soft, NT, ND Ext: No C/C/E   Intake/Output from previous day: 08/08 0701 - 08/09 0700 In: 740 [P.O.:240; IV Piggyback:500] Out: -  Intake/Output this shift: No intake/output data recorded.   Lab Results: Recent Labs    03/18/19 1103 03/19/19 0519  WBC 20.6* 17.3*  HGB 10.8* 10.6*  PLT 241 231  MCV 91.6 92.9   BMET Recent Labs    03/18/19 1103 03/19/19 0519 03/20/19 0529  NA 136 137 139  K 2.8* 3.1* 3.2*  CL 99 107 111  CO2 25 20* 22  GLUCOSE 116* 98 107*  BUN 33* 28* 23  CREATININE 1.77* 1.15 0.93  CALCIUM 7.6* 7.4* 7.5*   LFT Recent Labs    03/18/19 1103 03/19/19 0519 03/20/19 0529  PROT 5.8* 5.1* 4.6*  ALBUMIN 2.7* 2.3* 2.1*  AST 17 17 14*  ALT 14 15 13   ALKPHOS 87 80 65  BILITOT 1.7* 1.2 0.6   PT/INR No results for input(s): INR in the last 72 hours.    Imaging/Other results: No results found.    Assessment and Plan:  1) C Diff Infection 2) Diarrhea 3) Pancolitis on CT with questionable superimposed Diverticulitis 4) Elevated TBili 5) HypoK+ 2/2 diarrheal losses  -Resume vancomycin 125 mg qid, with plan for long tapering course x8 to 12 weeks. -Resume IV Flagyl (day 2). Plan to continue this for now due to possible superimposed diverticulitis along with elevated WBC in his elderly male with pancolitis. Hope to de-escalate antimicrobial therapy in the  near future -okay to add Florastor  -Daily CBC and BMP for now -T bili now normal.  Suspect this was 2/2 cholestasis of systemic illness.  Can stop LAE trend -Resume daily lytes with repletion prn -can likely start to scale back IV fluids given improved p.o. intake -tolerating soft foods.  Can likely escalate to full tomorrow -Can hopefully remove rectal tube once a stool more formed -Will continue to follow     Lavena Bullion, DO  03/20/2019, 5:23 PM [facility] Pager [phone]

## 2019-03-21 ENCOUNTER — Inpatient Hospital Stay (HOSPITAL_COMMUNITY): Payer: Medicare Other

## 2019-03-21 LAB — CBC WITH DIFFERENTIAL/PLATELET
Abs Immature Granulocytes: 0.08 10*3/uL — ABNORMAL HIGH (ref 0.00–0.07)
Basophils Absolute: 0.1 10*3/uL (ref 0.0–0.1)
Basophils Relative: 1 %
Eosinophils Absolute: 0.3 10*3/uL (ref 0.0–0.5)
Eosinophils Relative: 3 %
HCT: 34.2 % — ABNORMAL LOW (ref 39.0–52.0)
Hemoglobin: 10.9 g/dL — ABNORMAL LOW (ref 13.0–17.0)
Immature Granulocytes: 1 %
Lymphocytes Relative: 5 %
Lymphs Abs: 0.5 10*3/uL — ABNORMAL LOW (ref 0.7–4.0)
MCH: 29.2 pg (ref 26.0–34.0)
MCHC: 31.9 g/dL (ref 30.0–36.0)
MCV: 91.7 fL (ref 80.0–100.0)
Monocytes Absolute: 0.6 10*3/uL (ref 0.1–1.0)
Monocytes Relative: 6 %
Neutro Abs: 8.5 10*3/uL — ABNORMAL HIGH (ref 1.7–7.7)
Neutrophils Relative %: 84 %
Platelets: 278 10*3/uL (ref 150–400)
RBC: 3.73 MIL/uL — ABNORMAL LOW (ref 4.22–5.81)
RDW: 13.5 % (ref 11.5–15.5)
WBC: 10.1 10*3/uL (ref 4.0–10.5)
nRBC: 0 % (ref 0.0–0.2)

## 2019-03-21 MED ORDER — FUROSEMIDE 10 MG/ML IJ SOLN
20.0000 mg | Freq: Once | INTRAMUSCULAR | Status: AC
  Administered 2019-03-21: 20 mg via INTRAVENOUS
  Filled 2019-03-21: qty 2

## 2019-03-21 NOTE — Progress Notes (Signed)
Patient ID: [name] [name], male   DOB: February 22, 1930, 85 y.o.   MRN: [id number]    Progress Note   Subjective   Day #4 CC; C. difficile colitis  Patient did not have a good night, difficulty sleeping since he has been hospitalized.  He has some mild abdominal disc comfort, has been tolerating some solid food, no nausea or vomiting. Rectal tube in place-continues with copious liquid stool  Patient complaining of some mild dyspnea this morning   Objective   Vital signs in last 24 hours: Temp:  [97.7 F (36.5 C)-98.3 F (36.8 C)] 98.2 F (36.8 C) (08/10 0615) Pulse Rate:  [58-72] 58 (08/10 0615) Resp:  [17-18] 17 (08/10 0615) BP: (144-145)/(73-88) 145/84 (08/10 0615) SpO2:  [96 %-97 %] 96 % (08/10 0615) Last BM Date: 03/20/19 General:   Elderly white male in NAD Heart:  Regular rate and rhythm; no murmurs Lungs: Bibasilar fine crackles Abdomen:  Soft, nontender and nondistended. Normal bowel sounds. Extremities:  Without edema. Neurologic:  Alert and oriented,  grossly normal neurologically. Psych:  Cooperative. Normal mood and affect.  Intake/Output from previous day: 08/09 0701 - 08/10 0700 In: 780 [P.O.:480; IV Piggyback:300] Out: 500 [Urine:500] Intake/Output this shift: No intake/output data recorded.  Lab Results: Recent Labs    03/18/19 1103 03/19/19 0519  WBC 20.6* 17.3*  HGB 10.8* 10.6*  HCT 32.8* 33.9*  PLT 241 231   BMET Recent Labs    03/18/19 1103 03/19/19 0519 03/20/19 0529  NA 136 137 139  K 2.8* 3.1* 3.2*  CL 99 107 111  CO2 25 20* 22  GLUCOSE 116* 98 107*  BUN 33* 28* 23  CREATININE 1.77* 1.15 0.93  CALCIUM 7.6* 7.4* 7.5*   LFT Recent Labs    03/20/19 0529  PROT 4.6*  ALBUMIN 2.1*  AST 14*  ALT 13  ALKPHOS 65  BILITOT 0.6   PT/INR No results for input(s): LABPROT, INR in the last 72 hours.  Studies/Results: No results found.     Assessment / Plan:    #37 85 year old white male with relapsing C. difficile colitis with  moderate diffuse colitis on imaging, admitted with leukocytosis, acute kidney injury and hypokalemia.  He is on oral vancomycin, and IV metronidazole Plan is for long tapered course of oral vancomycin over 8 to 12 weeks Add Florastor twice daily Advance diet as tolerated CBC today  Thus far continues with significant liquid stool  #2 hypokalemia-correcting #3 acute kidney injury-resolved  #4 new complaint of mild dyspnea today-Will order chest Xray-management as per hospitalist          Principal Problem:   C. difficile colitis Active Problems:   Hypokalemia   Acute kidney injury ([place])   Pancolitis ([place])     LOS: 3 days   Aldea Avis  03/21/2019, 10:53 AM

## 2019-03-21 NOTE — Progress Notes (Signed)
PROGRESS NOTE  [name] [name] BXU:[id number] DOB: [birth year] DOA: 03/18/2019 PCP: [name], MD  Brief History   85 year old man PMH C. difficile x2 treated with 2 weeks of vancomycin each time, presented with 2 weeks of diarrhea, left-sided abdominal pain, poor appetite, weakness.  CT abdomen pelvis showed pancolitis.  A & P  C. difficile colitis with pancolitis, recurrent, current episode.  Pneumobilia and biliary dilatation seen on CT consistent with prior ERCP, sphincterotomy.  Of note patient is status post ERCP 02/2019 --Although afebrile and without current pain he continues to have copious liquid stool.  Plan is for long vancomycin taper.  GI also has been treating for possible concomitant diverticulitis with Flagyl.  Given patient's improvement will stop Flagyl after 8/11.  AKI. --Resolved with IV fluids.  Continue to hold valsartan, hydrochlorothiazide  Hypokalemia --Repleted yesterday.  Check BMP in a.m.  Essential hypertension --Diastolic hypertension noted.  Aortic atherosclerosis --Follow-up as an outpatient.   Continues to have copious liquid stool.  No opportunity for discharge until stool is well formed and management.  Continue oral vancomycin.  Episode of shortness of breath and wheezing this morning relieved after Lasix.  Had a component of volume overload secondary to fluids.  Of note LVEF 50-55% by echocardiogram July 2017.   DVT prophylaxis: enoxaparin Code Status: Full Family Communication: none Disposition Plan: home    Murray Hodgkins, MD  [facility] Direct contact: see [email or web address] (further directions at bottom of note if needed) 7PM-7AM contact night coverage as at bottom of note 03/21/2019, 3:24 PM  LOS: 3 days   Consultants  . GI  Procedures  .   Antibiotics  . Oral vancomycin 8/7 > . IV Flagyl 8/7 >  Interval History/Subjective  Was short of breath this morning with some wheezing per RN.  Patient confirms wheezing.  Feels  better now after Lasix.  Has urinated quite a bit.  No abdominal pain.  Tolerating diet.  Enjoying coughing more than anything else but did eat some pancakes this morning.  Objective   Vitals:  Vitals:   03/21/19 0615 03/21/19 1410  BP: (!) 145/84 (!) 143/110  Pulse: (!) 58 (!) 44  Resp: 17 16  Temp: 98.2 F (36.8 C) 98.3 F (36.8 C)  SpO2: 96% 97%    Exam:  Constitutional:   . Appears calm and comfortable Respiratory:  . CTA bilaterally, no w/r/r.  . Respiratory effort normal. Cardiovascular:  . RRR, no m/r/g . No LE extremity edema   Abdomen:  . Soft, nontender, nondistended.  Flexi-Seal bag shows loose stool. Psychiatric:  . Mental status o Mood, affect appropriate  I have personally reviewed the following:   Today's Data  . Urine output 500+.  I/O not accurate. . Hemoglobin stable at 10.9.  WBC down to 10.1.  Scheduled Meds: . aspirin EC  81 mg Oral Q supper  . B-complex with vitamin C  1 tablet Oral Daily  . cholecalciferol  1,000 Units Oral Daily  . dutasteride  0.5 mg Oral QPM  . enoxaparin (LOVENOX) injection  40 mg Subcutaneous Q24H  . mirabegron ER  50 mg Oral Q supper  . rosuvastatin  20 mg Oral Q supper  . saccharomyces boulardii  250 mg Oral BID  . tamsulosin  0.4 mg Oral QPM  . vancomycin  125 mg Oral QID   Followed by  . [START ON 04/01/2019] vancomycin  125 mg Oral BID   Followed by  . [START ON 04/09/2019] vancomycin  125 mg  Oral Daily   Followed by  . [START ON 04/16/2019] vancomycin  125 mg Oral QODAY   Followed by  . [START ON 05/14/2019] vancomycin  125 mg Oral Q3 days   Continuous Infusions: . metronidazole Stopped (03/21/19 1457)    Principal Problem:   C. difficile colitis Active Problems:   Hypokalemia   Acute kidney injury ([place])   Pancolitis ([place])   LOS: 3 days   How to contact the [facility] Attending or Consulting provider [place] or covering provider during after hours [place], for this patient?  1. Check the care team in [facility] and  look for a) attending/consulting [facility] provider listed and b) the [facility] team listed 2. Log into [email or web address] and use [place]'s universal password to access. If you do not have the password, please contact the hospital operator. 3. Locate the [facility] provider you are looking for under [facility] and page to a number that you can be directly reached. 4. If you still have difficulty reaching the provider, please page the [facility] (Director on Call) for the Hospitalists listed on amion for assistance.

## 2019-03-22 ENCOUNTER — Ambulatory Visit: Payer: Medicare Other | Admitting: Internal Medicine

## 2019-03-22 DIAGNOSIS — I10 Essential (primary) hypertension: Secondary | ICD-10-CM

## 2019-03-22 LAB — BASIC METABOLIC PANEL
Anion gap: 9 (ref 5–15)
BUN: 14 mg/dL (ref 8–23)
CO2: 23 mmol/L (ref 22–32)
Calcium: 7.5 mg/dL — ABNORMAL LOW (ref 8.9–10.3)
Chloride: 104 mmol/L (ref 98–111)
Creatinine, Ser: 0.88 mg/dL (ref 0.61–1.24)
GFR calc Af Amer: >60 mL/min (ref >60)
GFR calc non Af Amer: >60 mL/min (ref >60)
Glucose, Bld: 112 mg/dL — ABNORMAL HIGH (ref 70–99)
Potassium: 3 mmol/L — ABNORMAL LOW (ref 3.5–5.1)
Sodium: 136 mmol/L (ref 135–145)

## 2019-03-22 LAB — MAGNESIUM: Magnesium: 1.6 mg/dL — ABNORMAL LOW (ref 1.7–2.4)

## 2019-03-22 MED ORDER — HYDROCHLOROTHIAZIDE 12.5 MG PO CAPS
12.5000 mg | ORAL_CAPSULE | Freq: Every day | ORAL | Status: DC
  Administered 2019-03-23: 12.5 mg via ORAL
  Filled 2019-03-22: qty 1

## 2019-03-22 MED ORDER — FIDAXOMICIN 200 MG PO TABS
200.0000 mg | ORAL_TABLET | Freq: Two times a day (BID) | ORAL | Status: DC
  Administered 2019-03-22 – 2019-03-31 (×18): 200 mg via ORAL
  Filled 2019-03-22 (×18): qty 1

## 2019-03-22 MED ORDER — IRBESARTAN 150 MG PO TABS
300.0000 mg | ORAL_TABLET | Freq: Every day | ORAL | Status: DC
  Administered 2019-03-23 – 2019-03-31 (×9): 300 mg via ORAL
  Filled 2019-03-22 (×9): qty 2

## 2019-03-22 MED ORDER — DICYCLOMINE HCL 10 MG PO CAPS
10.0000 mg | ORAL_CAPSULE | Freq: Three times a day (TID) | ORAL | Status: DC
  Administered 2019-03-22 – 2019-03-31 (×36): 10 mg via ORAL
  Filled 2019-03-22 (×36): qty 1

## 2019-03-22 MED ORDER — MAGNESIUM SULFATE 2 GM/50ML IV SOLN
2.0000 g | Freq: Once | INTRAVENOUS | Status: AC
  Administered 2019-03-22: 2 g via INTRAVENOUS
  Filled 2019-03-22: qty 50

## 2019-03-22 MED ORDER — VALSARTAN-HYDROCHLOROTHIAZIDE 320-12.5 MG PO TABS: 1.0000 | ORAL_TABLET | Freq: Every morning | ORAL | Status: DC

## 2019-03-22 MED ORDER — POTASSIUM CHLORIDE CRYS ER 20 MEQ PO TBCR
40.0000 meq | EXTENDED_RELEASE_TABLET | ORAL | Status: AC
  Administered 2019-03-22 (×2): 40 meq via ORAL
  Filled 2019-03-22 (×2): qty 2

## 2019-03-22 NOTE — Progress Notes (Signed)
     [facility] Progress Note  CC:  Cdiff colitis  Subjective:  Patient pulled out flexiseal overnight.  New one placed this AM.  Describes spasm/tenesmus with bladder and rectum, worse this AM since new tube placed.  Still liquid stool in tube, but none in bag right now since it was just changed/replaced.  Says that the sensation is quite bothersome.  Eating decent amounts.  Objective:  Vital signs in last 24 hours: Temp:  [97.7 F (36.5 C)-98.3 F (36.8 C)] 97.7 F (36.5 C) (08/11 1610) Pulse Rate:  [44-77] 76 (08/11 0633) Resp:  [16-17] 17 (08/11 0633) BP: (137-147)/(87-110) 137/95 (08/11 0633) SpO2:  [94 %-97 %] 94 % (08/11 9604) Last BM Date: 03/21/19 General:  Alert, Well-developed, in NAD Heart:  Regular rate and rhythm; no murmurs Pulm:  CTAB.  No W/R/R.  No increased WOB. Abdomen:  Soft, non-distended.  BS present.  Non-tender. Extremities:  Without edema. Neurologic:  Alert and oriented x 4;  grossly normal neurologically. Psych:  Alert and cooperative. Normal mood and affect.  Intake/Output from previous day: 08/10 0701 - 08/11 0700 In: 293.9 [IV Piggyback:293.9] Out: 1000 [Urine:800; Stool:200]  Lab Results: Recent Labs    03/21/19 1148  WBC 10.1  HGB 10.9*  HCT 34.2*  PLT 278   BMET Recent Labs    03/20/19 0529 03/22/19 0445  NA 139 136  K 3.2* 3.0*  CL 111 104  CO2 22 23  GLUCOSE 107* 112*  BUN 23 14  CREATININE 0.93 0.88  CALCIUM 7.5* 7.5*   LFT Recent Labs    03/20/19 0529  PROT 4.6*  ALBUMIN 2.1*  AST 14*  ALT 13  ALKPHOS 65  BILITOT 0.6   Dg Chest 2 View  Result Date: 03/21/2019 CLINICAL DATA:  Dyspnea weakness EXAM: CHEST - 2 VIEW COMPARISON:  June 10, 2016 FINDINGS: Trace bilateral pleural effusions are seen. No large airspace consolidation. There is mild cardiomegaly with overlying median sternotomy wires. No acute osseous findings. IMPRESSION: Trace bilateral pleural effusions. Electronically Signed   By:  Prudencio Pair M.D.   On: 03/21/2019 16:13   Assessment / Plan: #19 85 year old white male with relapsing C. difficile colitis with moderate diffuse colitis on imaging, admitted with leukocytosis, acute kidney injury and hypokalemia.  He is on oral vancomycin, and IV metronidazole. Plan is for long tapered course of oral vancomycin over 8 to 12 weeks.  IV metronidazole to be discontinued today. Continue Florastor twice daily Will add Bentyl 10 mg ACHS for rectal spasm complaint.  Thus far continues with liquid stool.  #2 hypokalemia-K+ 3.0 this AM #3 acute kidney injury-resolved #4 new complaint of mild dyspnea (improved) but also mild cough-chest x-ray yesterday with trace B/L pleural effusions.  Lungs clear on exam today.    LOS: 4 days   Laban Emperor. Keena Heesch  03/22/2019, 8:48 AM

## 2019-03-22 NOTE — Progress Notes (Signed)
PROGRESS NOTE  [name] [name] XMD:[id number] DOB: May 23, 1930 DOA: 03/18/2019 PCP: [name], MD  Brief History   85 year old man PMH C. difficile x2 treated with 2 weeks of vancomycin each time, presented with 2 weeks of diarrhea, left-sided abdominal pain, poor appetite, weakness.  CT abdomen pelvis showed pancolitis.  Continue to treat for C. difficile pancolitis with ongoing diarrhea.  A & P  C. difficile colitis with pancolitis, recurrent, current episode.  Pneumobilia and biliary dilatation seen on CT consistent with prior ERCP, sphincterotomy.  Of note patient is status post ERCP 02/2019 --Still has significant diarrhea although remains afebrile and nontoxic.  Appreciate gastroenterology involvement.  Other treatment modalities being considered including fecal transplant. Dificid added.  AKI. --Resolved with IV fluids.    Hypokalemia, hypomagnesemia --Replete  Essential hypertension --Diastolic hypertension noted.  Asymptomatic.  Resume valsartan and hydrochlorothiazide.  Aortic atherosclerosis --Follow-up as an outpatient.  DVT prophylaxis: enoxaparin Code Status: Full Family Communication: none Disposition Plan: home    Murray Hodgkins, MD  [facility] Direct contact: see [email or web address] (further directions at bottom of note if needed) 7PM-7AM contact night coverage as at bottom of note 03/22/2019, 5:47 PM  LOS: 4 days   Consultants  . GI  Procedures  .   Antibiotics  . Oral vancomycin 8/7 > . IV Flagyl 8/7 >  Interval History/Subjective  Breathing better although still seems to get winded when having a long conversation.  Continues to have diarrhea.  Appetite fair.  No vomiting.  Not much abdominal discomfort.  Objective   Vitals:  Vitals:   03/21/19 2120 03/22/19 0633  BP: (!) 147/87 (!) 137/95  Pulse: 77 76  Resp: 17 17  Temp: 98.3 F (36.8 C) 97.7 F (36.5 C)  SpO2: 96% 94%    Exam:  Constitutional:   . Appears calm and comfortable  Respiratory:  . CTA bilaterally, no w/r/r.  . Respiratory effort normal.  Cardiovascular:  . RRR, no m/r/g . No LE extremity edema   Abdomen:  . Soft Psychiatric:  . Mental status o Mood, affect appropriate  I have personally reviewed the following:   Today's Data  . Urine output 800+.  I/O not accurate. . Magnesium 1.6, potassium 3.2, BUN and creatinine within normal limits  Scheduled Meds: . aspirin EC  81 mg Oral Q supper  . B-complex with vitamin C  1 tablet Oral Daily  . cholecalciferol  1,000 Units Oral Daily  . dicyclomine  10 mg Oral TID AC & HS  . dutasteride  0.5 mg Oral QPM  . enoxaparin (LOVENOX) injection  40 mg Subcutaneous Q24H  . fidaxomicin  200 mg Oral BID  . mirabegron ER  50 mg Oral Q supper  . rosuvastatin  20 mg Oral Q supper  . saccharomyces boulardii  250 mg Oral BID  . tamsulosin  0.4 mg Oral QPM  . vancomycin  125 mg Oral QID   Followed by  . [START ON 04/01/2019] vancomycin  125 mg Oral BID   Followed by  . [START ON 04/09/2019] vancomycin  125 mg Oral Daily   Followed by  . [START ON 04/16/2019] vancomycin  125 mg Oral QODAY   Followed by  . [START ON 05/14/2019] vancomycin  125 mg Oral Q3 days   Continuous Infusions: . metronidazole 500 mg (03/22/19 1541)    Principal Problem:   C. difficile colitis Active Problems:   Hypokalemia   Acute kidney injury ([place])   Pancolitis ([place])   LOS: 4 days  How to contact the [facility] Attending or Consulting provider [place] or covering provider during after hours [place], for this patient?  1. Check the care team in [facility] and look for a) attending/consulting [facility] provider listed and b) the [facility] team listed 2. Log into [email or web address] and use [place]'s universal password to access. If you do not have the password, please contact the hospital operator. 3. Locate the [facility] provider you are looking for under [facility] and page to a number that you can be directly reached. 4. If you still have difficulty  reaching the provider, please page the [facility] (Director on Call) for the Hospitalists listed on amion for assistance.

## 2019-03-23 ENCOUNTER — Telehealth: Payer: Self-pay | Admitting: Internal Medicine

## 2019-03-23 DIAGNOSIS — E440 Moderate protein-calorie malnutrition: Secondary | ICD-10-CM

## 2019-03-23 LAB — BASIC METABOLIC PANEL
Anion gap: 6 (ref 5–15)
BUN: 14 mg/dL (ref 8–23)
CO2: 24 mmol/L (ref 22–32)
Calcium: 7.6 mg/dL — ABNORMAL LOW (ref 8.9–10.3)
Chloride: 109 mmol/L (ref 98–111)
Creatinine, Ser: 0.87 mg/dL (ref 0.61–1.24)
GFR calc Af Amer: >60 mL/min (ref >60)
GFR calc non Af Amer: >60 mL/min (ref >60)
Glucose, Bld: 107 mg/dL — ABNORMAL HIGH (ref 70–99)
Potassium: 3.6 mmol/L (ref 3.5–5.1)
Sodium: 139 mmol/L (ref 135–145)

## 2019-03-23 LAB — MAGNESIUM: Magnesium: 1.8 mg/dL (ref 1.7–2.4)

## 2019-03-23 MED ORDER — FIDAXOMICIN 200 MG PO TABS: 200.0000 mg | ORAL_TABLET | Freq: Two times a day (BID) | ORAL | 0 refills | Status: DC

## 2019-03-23 MED ORDER — SODIUM CHLORIDE 0.9 % IV SOLN
INTRAVENOUS | Status: DC
  Administered 2019-03-23: 17:00:00 via INTRAVENOUS

## 2019-03-23 NOTE — Telephone Encounter (Signed)
Pt called requesting that Dr. Carlean Purl call his son Belenda Cruise at [phone] to speak with him about change in his treatment.

## 2019-03-23 NOTE — Progress Notes (Addendum)
[facility] Progress Note  CC:  Cdiff diarrhea  Subjective:  Added dificid to regimen last evening.  Getting confused at night, pulled out rectal tube again.  Stools still very water with small amounts of formed pieces.  He says that the rectal spasms seems slightly better with the addition of Bentyl yesterday.  Objective:  Vital signs in last 24 hours: Temp:  [97.9 F (36.6 C)-98.8 F (37.1 C)] 98.3 F (36.8 C) (08/12 0603) Pulse Rate:  [66-87] 66 (08/12 0603) Resp:  [18-20] 18 (08/12 0603) BP: (131-154)/(84-93) 131/86 (08/12 0603) SpO2:  [96 %] 96 % (08/12 0603) Last BM Date: 03/22/19 General:  Alert, Well-developed, in NAD Heart:  Regular rate and rhythm; no murmurs Pulm:  CTAB.  No increased WOB. Abdomen:  Soft, non-distended.  BS present.  Non-tender. Extremities:  Without edema. Neurologic:  Alert and oriented x 4;  grossly normal neurologically. Psych:  Alert and cooperative. Normal mood and affect.  Intake/Output from previous day: 08/11 0701 - 08/12 0700 In: 2248.9 [P.O.:2129; IV Piggyback:119.9] Out: 2800 [Urine:2800]  Lab Results: Recent Labs    03/21/19 1148  WBC 10.1  HGB 10.9*  HCT 34.2*  PLT 278   BMET Recent Labs    03/22/19 0445 03/23/19 0543  NA 136 139  K 3.0* 3.6  CL 104 109  CO2 23 24  GLUCOSE 112* 107*  BUN 14 14  CREATININE 0.88 0.87  CALCIUM 7.5* 7.6*   Dg Chest 2 View  Result Date: 03/21/2019 CLINICAL DATA:  Dyspnea weakness EXAM: CHEST - 2 VIEW COMPARISON:  June 10, 2016 FINDINGS: Trace bilateral pleural effusions are seen. No large airspace consolidation. There is mild cardiomegaly with overlying median sternotomy wires. No acute osseous findings. IMPRESSION: Trace bilateral pleural effusions. Electronically Signed   By: Prudencio Pair M.D.   On: 03/21/2019 16:13   Assessment / Plan: #83 85 year old white male with relapsing C. difficile colitis with moderate diffuse colitis on imaging, admitted with  leukocytosis, acute kidney injury and hypokalemia.  He is on oral vancomycin.  Received IV flagyl but that was discontinued after 5 days.  Added Dificid last evening (8/11). Plan is for long tapered course of oral vancomycin over 8 to 12 weeks. Continue Florastor twice daily. Added Bentyl 10 mg ACHS on 8/11 for rectal spasm complaint, which has possibly helped somewhat.  Thus far continues with liquid stool.  Fecal transplant not possible due to Covid.  Dr. Carlean Purl suggested newer monoclonal Ab, Zinplava, if continues with minimal improvement.  I messaged pharmacist here at [facility] about that medication.  #2 hypokalemia-K+ improved at 3.6 this AM  #3 acute kidney injury-resolved   LOS: 5 days   Laban Emperor. Zehr  03/23/2019, 9:12 AM    [facility] Attending   I have taken an interval history, reviewed the chart and examined the patient. I agree with the Advanced Practitioner's note, impression and recommendations.   I think he has improved. This is the point where I hope and usually see the diarrhea to improve more.  Hopefully that will be the case and he can be dc in 2-3 days (best scenario I guess).  I did review his situation with his son Dr. Berdine Addison - anesthesiologist in [place], [place], by phone.  As far as Dificid as outpatient that may be cost prohibitive so I am ok with vancomycin taper and I will look into Zinplava biologic while on the taper - cannot give that inpatient - not on formulary and that  may be cost-prohibitive as well but we can see.   Needs PT and dietitian consults please  I have ordered dietitian consult but given the continued diarrhea hold on PT/OPT  [name], MD, [facility] 03/23/2019 1:09 PM Pager [phone]

## 2019-03-23 NOTE — Care Management Important Message (Signed)
Important Message  Patient Details IM Letter given to Sharren Bridge SW to present to the Patient Name: [name] [name] MRN: [id number] Date of Birth: 10/29/1929   Medicare Important Message Given:  Yes     Kerin Salen 03/23/2019, 10:49 AM

## 2019-03-23 NOTE — Progress Notes (Signed)
PROGRESS NOTE    [name] [name]  EKC:[id number] DOB: March 09, 1930 DOA: 03/18/2019 PCP: [name], MD   Brief Narrative: 85 year old with past medical history significant for C. difficile x2 treated with 2 weeks of vancomycin each time, presented with 2 weeks of diarrhea, left-sided abdominal pain, poor appetite, weakness.  CT abdomen and pelvis showed pancolitis.  Continue to treat for C. difficile pancolitis.   Assessment & Plan:   Principal Problem:   C. difficile colitis Active Problems:   Hypokalemia   Acute kidney injury ([place])   Pancolitis ([facility])   Moderate malnutrition ([place])  1-C.  Difficile colitis with pancolitis Recurrent episode. Pneumobilia and biliary dilation seen on CT consistent with prior ERCP, and sphincterotomy.  Of note patient is a s/p ERCP 02/2019. Continue with oral vancomycin, Dificid added on 8/11 Further care per Dr. Carlean Purl  AKI: Resolved with IV fluids Will hold hydrochlorothiazide for now. Resume gentle hydration  Hypokalemia, hypomagnesemia Repeat levels in the morning.  Hypertension: Continue with irbesartan Hold hydrochlorothiazide for now   Estimated body mass index is 20.67 kg/m as calculated from the following:   Height as of this encounter: 6' 2"  (1.88 m).   Weight as of this encounter: 73 kg.   DVT prophylaxis: Lovenox Code Status: Full code Family Communication: Dr. Alcario Drought will contact patient son today Disposition Plan: Remain in the hospital for treatment of C. difficile colitis Consultants:   GI  Procedures:   None  Antimicrobials:  Oral vancomycin  Subjective: He denies abdominal pain.  On my evaluation he just had a bowel movement, stool is soft but not watery  Objective: Vitals:   03/22/19 1409 03/22/19 2109 03/23/19 0603 03/23/19 1442  BP: 136/84 (!) 154/93 131/86 139/71  Pulse: 87 71 66 74  Resp: 20 18 18 20   Temp: 97.9 F (36.6 C) 98.8 F (37.1 C) 98.3 F (36.8 C) 98.7 F (37.1 C)  TempSrc: Oral  Oral Oral Oral  SpO2: 96% 96% 96% 98%  Weight:      Height:        Intake/Output Summary (Last 24 hours) at 03/23/2019 1532 Last data filed at 03/23/2019 1436 Gross per 24 hour  Intake 1537 ml  Output 1700 ml  Net -163 ml   Filed Weights   03/18/19 1016  Weight: 73 kg    Examination:  General exam: Appears calm and comfortable  Respiratory system: Clear to auscultation. Respiratory effort normal. Cardiovascular system: S1 & S2 heard, RRR. No JVD, murmurs, rubs, gallops or clicks. No pedal edema. Gastrointestinal system: Abdomen is nondistended, soft and nontender. No organomegaly or masses felt. Normal bowel sounds heard. Central nervous system: Alert and oriented. No focal neurological deficits. Extremities: Symmetric 5 x 5 power. Skin: No rashes, lesions or ulcers Psychiatry: Judgement and insight appear normal. Mood & affect appropriate.     Data Reviewed: I have personally reviewed following labs and imaging studies  CBC: Recent Labs  Lab 03/18/19 1103 03/19/19 0519 03/21/19 1148  WBC 20.6* 17.3* 10.1  NEUTROABS 18.5*  --  8.5*  HGB 10.8* 10.6* 10.9*  HCT 32.8* 33.9* 34.2*  MCV 91.6 92.9 91.7  PLT 241 231 505   Basic Metabolic Panel: Recent Labs  Lab 03/18/19 1103 03/19/19 0519 03/20/19 0529 03/22/19 0445 03/23/19 0543  NA 136 137 139 136 139  K 2.8* 3.1* 3.2* 3.0* 3.6  CL 99 107 111 104 109  CO2 25 20* 22 23 24   GLUCOSE 116* 98 107* 112* 107*  BUN 33* 28*  23 14 14   CREATININE 1.77* 1.15 0.93 0.88 0.87  CALCIUM 7.6* 7.4* 7.5* 7.5* 7.6*  MG  --  1.6* 2.0 1.6* 1.8  PHOS  --   --  2.9  --   --    GFR: Estimated Creatinine Clearance: 62.9 mL/min (by C-G formula based on SCr of 0.87 mg/dL). Liver Function Tests: Recent Labs  Lab 03/18/19 1103 03/19/19 0519 03/20/19 0529  AST 17 17 14*  ALT 14 15 13   ALKPHOS 87 80 65  BILITOT 1.7* 1.2 0.6  PROT 5.8* 5.1* 4.6*  ALBUMIN 2.7* 2.3* 2.1*   Recent Labs  Lab 03/18/19 1103  LIPASE 20   No  results for input(s): AMMONIA in the last 168 hours. Coagulation Profile: No results for input(s): INR, PROTIME in the last 168 hours. Cardiac Enzymes: No results for input(s): CKTOTAL, CKMB, CKMBINDEX, TROPONINI in the last 168 hours. BNP (last 3 results) No results for input(s): PROBNP in the last 8760 hours. HbA1C: No results for input(s): HGBA1C in the last 72 hours. CBG: No results for input(s): GLUCAP in the last 168 hours. Lipid Profile: No results for input(s): CHOL, HDL, LDLCALC, TRIG, CHOLHDL, LDLDIRECT in the last 72 hours. Thyroid Function Tests: No results for input(s): TSH, T4TOTAL, FREET4, T3FREE, THYROIDAB in the last 72 hours. Anemia Panel: No results for input(s): VITAMINB12, FOLATE, FERRITIN, TIBC, IRON, RETICCTPCT in the last 72 hours. Sepsis Labs: No results for input(s): PROCALCITON, LATICACIDVEN in the last 168 hours.  Recent Results (from the past 240 hour(s))  C difficile quick scan w PCR reflex     Status: Abnormal   Collection Time: 03/18/19 11:03 AM   Specimen: STOOL  Result Value Ref Range Status   C Diff antigen POSITIVE (A) NEGATIVE Final   C Diff toxin POSITIVE (A) NEGATIVE Final   C Diff interpretation Toxin producing C. difficile detected.  Final    Comment: RESULT CALLED TO, READ BACK BY AND VERIFIED WITH: GRIFFIN, M. RN @1231  ON 8.7.2020 BY [facility] Performed at [facility], [place] [address], [place], [place] [address]   SARS CORONAVIRUS 2 Nasal Swab Aptima Multi Swab     Status: None   Collection Time: 03/18/19  1:54 PM   Specimen: Aptima Multi Swab; Nasal Swab  Result Value Ref Range Status   SARS Coronavirus 2 NEGATIVE NEGATIVE Final    Comment: (NOTE) SARS-CoV-2 target nucleic acids are NOT DETECTED. The SARS-CoV-2 RNA is generally detectable in upper and lower respiratory specimens during the acute phase of infection. Negative results do not preclude SARS-CoV-2 infection, do not rule out co-infections with other  pathogens, and should not be used as the sole basis for treatment or other patient management decisions. Negative results must be combined with clinical observations, patient history, and epidemiological information. The expected result is Negative. Fact Sheet for Patients: SugarRoll.be Fact Sheet for Healthcare Providers: https://www.woods-mathews.com/ This test is not yet approved or cleared by the [place] FDA and  has been authorized for detection and/or diagnosis of SARS-CoV-2 by FDA under an Emergency Use Authorization (EUA). This EUA will remain  in effect (meaning this test can be used) for the duration of the COVID-19 declaration under Section 56 4(b)(1) of the Act, 21 U.S.C. section 360bbb-3(b)(1), unless the authorization is terminated or revoked sooner. Performed at [facility], [place] [address] [place], [place] [address]          Radiology Studies: Dg Chest 2 View  Result Date: 03/21/2019 CLINICAL DATA:  Dyspnea  weakness EXAM: CHEST - 2 VIEW COMPARISON:  June 10, 2016 FINDINGS: Trace bilateral pleural effusions are seen. No large airspace consolidation. There is mild cardiomegaly with overlying median sternotomy wires. No acute osseous findings. IMPRESSION: Trace bilateral pleural effusions. Electronically Signed   By: Prudencio Pair M.D.   On: 03/21/2019 16:13        Scheduled Meds: . aspirin EC  81 mg Oral Q supper  . B-complex with vitamin C  1 tablet Oral Daily  . cholecalciferol  1,000 Units Oral Daily  . dicyclomine  10 mg Oral TID AC & HS  . dutasteride  0.5 mg Oral QPM  . enoxaparin (LOVENOX) injection  40 mg Subcutaneous Q24H  . fidaxomicin  200 mg Oral BID  . irbesartan  300 mg Oral Daily  . mirabegron ER  50 mg Oral Q supper  . rosuvastatin  20 mg Oral Q supper  . saccharomyces boulardii  250 mg Oral BID  . tamsulosin  0.4 mg Oral QPM  . vancomycin  125 mg Oral QID   Followed by  . [START ON  04/01/2019] vancomycin  125 mg Oral BID   Followed by  . [START ON 04/09/2019] vancomycin  125 mg Oral Daily   Followed by  . [START ON 04/16/2019] vancomycin  125 mg Oral QODAY   Followed by  . [START ON 05/14/2019] vancomycin  125 mg Oral Q3 days   Continuous Infusions: . sodium chloride       LOS: 5 days    Time spent: 35 minutes.     Elmarie Shiley, MD [facility] Pager [phone]  If 7PM-7AM, please contact night-coverage [email or web address] Password [facility] 03/23/2019, 3:32 PM

## 2019-03-23 NOTE — Telephone Encounter (Signed)
See chart - done

## 2019-03-24 LAB — BASIC METABOLIC PANEL
Anion gap: 6 (ref 5–15)
BUN: 11 mg/dL (ref 8–23)
CO2: 25 mmol/L (ref 22–32)
Calcium: 7.6 mg/dL — ABNORMAL LOW (ref 8.9–10.3)
Chloride: 108 mmol/L (ref 98–111)
Creatinine, Ser: 0.81 mg/dL (ref 0.61–1.24)
GFR calc Af Amer: >60 mL/min (ref >60)
GFR calc non Af Amer: >60 mL/min (ref >60)
Glucose, Bld: 105 mg/dL — ABNORMAL HIGH (ref 70–99)
Potassium: 3.2 mmol/L — ABNORMAL LOW (ref 3.5–5.1)
Sodium: 139 mmol/L (ref 135–145)

## 2019-03-24 LAB — CBC
HCT: 33.9 % — ABNORMAL LOW (ref 39.0–52.0)
Hemoglobin: 10.5 g/dL — ABNORMAL LOW (ref 13.0–17.0)
MCH: 28.8 pg (ref 26.0–34.0)
MCHC: 31 g/dL (ref 30.0–36.0)
MCV: 92.9 fL (ref 80.0–100.0)
Platelets: 299 10*3/uL (ref 150–400)
RBC: 3.65 MIL/uL — ABNORMAL LOW (ref 4.22–5.81)
RDW: 13.6 % (ref 11.5–15.5)
WBC: 9.2 10*3/uL (ref 4.0–10.5)
nRBC: 0 % (ref 0.0–0.2)

## 2019-03-24 LAB — MAGNESIUM: Magnesium: 1.7 mg/dL (ref 1.7–2.4)

## 2019-03-24 MED ORDER — POTASSIUM CHLORIDE CRYS ER 20 MEQ PO TBCR
40.0000 meq | EXTENDED_RELEASE_TABLET | Freq: Once | ORAL | Status: AC
  Administered 2019-03-24: 08:00:00 40 meq via ORAL
  Filled 2019-03-24: qty 2

## 2019-03-24 MED ORDER — BOOST / RESOURCE BREEZE PO LIQD CUSTOM
1.0000 | Freq: Three times a day (TID) | ORAL | Status: DC
  Administered 2019-03-25 – 2019-03-30 (×14): 1 via ORAL

## 2019-03-24 MED ORDER — MAGNESIUM SULFATE 2 GM/50ML IV SOLN
2.0000 g | Freq: Once | INTRAVENOUS | Status: AC
  Administered 2019-03-24: 2 g via INTRAVENOUS
  Filled 2019-03-24: qty 50

## 2019-03-24 NOTE — Progress Notes (Signed)
PROGRESS NOTE    [name] [name]  GOT:[id number] DOB: 1930/01/03 DOA: 03/18/2019 PCP: [name], MD   Brief Narrative: 85 year old with past medical history significant for C. difficile x2 treated with 2 weeks of vancomycin each time, presented with 2 weeks of diarrhea, left-sided abdominal pain, poor appetite, weakness.  CT abdomen and pelvis showed pancolitis.  Continue to treat for C. difficile pancolitis.   Assessment & Plan:   Principal Problem:   C. difficile colitis Active Problems:   Hypokalemia   Acute kidney injury ([place])   Pancolitis ([facility])   Moderate malnutrition ([place])  1-C.  Difficile colitis with pancolitis Recurrent episode. Pneumobilia and biliary dilation seen on CT consistent with prior ERCP, and sphincterotomy.  Of note patient is a s/p ERCP 02/2019. Continue with oral vancomycin, Dificid added on 8/12 Management  per Dr. Carlean Purl Still with watery diarrhea.   AKI: Resolved with IV fluids Will hold hydrochlorothiazide for now. Resume gentle hydration  Hypokalemia, hypomagnesemia Replete with 40 meq IV magnesium.   Hypertension: Continue with irbesartan Hold hydrochlorothiazide for now   Estimated body mass index is 20.67 kg/m as calculated from the following:   Height as of this encounter: 6' 2"  (1.88 m).   Weight as of this encounter: 73 kg.   DVT prophylaxis: Lovenox Code Status: Full code Family Communication: Dr. Alcario Drought updating family Disposition Plan: Remain in the hospital for treatment of C. difficile colitis Consultants:   GI  Procedures:   None  Antimicrobials:  Oral vancomycin  Subjective: He feels there is something stock in rectal tube.  He denies abdominal [ain.  Still with watery diarrhea.   Objective: Vitals:   03/23/19 1442 03/23/19 2146 03/24/19 0529 03/24/19 1325  BP: 139/71 119/76 116/68 (!) 144/94  Pulse: 74 66 68 (!) 58  Resp: 20 16 18 15   Temp: 98.7 F (37.1 C) 98.4 F (36.9 C) 98.8 F (37.1 C) 97.6  F (36.4 C)  TempSrc: Oral Oral Oral Oral  SpO2: 98% 99% 98% 97%  Weight:      Height:        Intake/Output Summary (Last 24 hours) at 03/24/2019 1455 Last data filed at 03/24/2019 1329 Gross per 24 hour  Intake 1268.75 ml  Output 1550 ml  Net -281.25 ml   Filed Weights   03/18/19 1016  Weight: 73 kg    Examination:  General exam: NAD Respiratory system: CTA Cardiovascular system: S 1, S 2 RRR. Gastrointestinal system; BS present, soft, nt Central nervous system: non focal.  Extremities: symmetric power.  Skin: no rashes.    Data Reviewed: I have personally reviewed following labs and imaging studies  CBC: Recent Labs  Lab 03/18/19 1103 03/19/19 0519 03/21/19 1148 03/24/19 0535  WBC 20.6* 17.3* 10.1 9.2  NEUTROABS 18.5*  --  8.5*  --   HGB 10.8* 10.6* 10.9* 10.5*  HCT 32.8* 33.9* 34.2* 33.9*  MCV 91.6 92.9 91.7 92.9  PLT 241 231 278 597   Basic Metabolic Panel: Recent Labs  Lab 03/19/19 0519 03/20/19 0529 03/22/19 0445 03/23/19 0543 03/24/19 0535  NA 137 139 136 139 139  K 3.1* 3.2* 3.0* 3.6 3.2*  CL 107 111 104 109 108  CO2 20* 22 23 24 25   GLUCOSE 98 107* 112* 107* 105*  BUN 28* 23 14 14 11   CREATININE 1.15 0.93 0.88 0.87 0.81  CALCIUM 7.4* 7.5* 7.5* 7.6* 7.6*  MG 1.6* 2.0 1.6* 1.8 1.7  PHOS  --  2.9  --   --   --  GFR: Estimated Creatinine Clearance: 67.6 mL/min (by C-G formula based on SCr of 0.81 mg/dL). Liver Function Tests: Recent Labs  Lab 03/18/19 1103 03/19/19 0519 03/20/19 0529  AST 17 17 14*  ALT 14 15 13   ALKPHOS 87 80 65  BILITOT 1.7* 1.2 0.6  PROT 5.8* 5.1* 4.6*  ALBUMIN 2.7* 2.3* 2.1*   Recent Labs  Lab 03/18/19 1103  LIPASE 20   No results for input(s): AMMONIA in the last 168 hours. Coagulation Profile: No results for input(s): INR, PROTIME in the last 168 hours. Cardiac Enzymes: No results for input(s): CKTOTAL, CKMB, CKMBINDEX, TROPONINI in the last 168 hours. BNP (last 3 results) No results for input(s):  PROBNP in the last 8760 hours. HbA1C: No results for input(s): HGBA1C in the last 72 hours. CBG: No results for input(s): GLUCAP in the last 168 hours. Lipid Profile: No results for input(s): CHOL, HDL, LDLCALC, TRIG, CHOLHDL, LDLDIRECT in the last 72 hours. Thyroid Function Tests: No results for input(s): TSH, T4TOTAL, FREET4, T3FREE, THYROIDAB in the last 72 hours. Anemia Panel: No results for input(s): VITAMINB12, FOLATE, FERRITIN, TIBC, IRON, RETICCTPCT in the last 72 hours. Sepsis Labs: No results for input(s): PROCALCITON, LATICACIDVEN in the last 168 hours.  Recent Results (from the past 240 hour(s))  C difficile quick scan w PCR reflex     Status: Abnormal   Collection Time: 03/18/19 11:03 AM   Specimen: STOOL  Result Value Ref Range Status   C Diff antigen POSITIVE (A) NEGATIVE Final   C Diff toxin POSITIVE (A) NEGATIVE Final   C Diff interpretation Toxin producing C. difficile detected.  Final    Comment: RESULT CALLED TO, READ BACK BY AND VERIFIED WITH: GRIFFIN, M. RN @1231  ON 8.7.2020 BY [facility] Performed at [facility], [place] [address] [place], [place] [address]   SARS CORONAVIRUS 2 Nasal Swab Aptima Multi Swab     Status: None   Collection Time: 03/18/19  1:54 PM   Specimen: Aptima Multi Swab; Nasal Swab  Result Value Ref Range Status   SARS Coronavirus 2 NEGATIVE NEGATIVE Final    Comment: (NOTE) SARS-CoV-2 target nucleic acids are NOT DETECTED. The SARS-CoV-2 RNA is generally detectable in upper and lower respiratory specimens during the acute phase of infection. Negative results do not preclude SARS-CoV-2 infection, do not rule out co-infections with other pathogens, and should not be used as the sole basis for treatment or other patient management decisions. Negative results must be combined with clinical observations, patient history, and epidemiological information. The expected result is Negative. Fact Sheet for  Patients: SugarRoll.be Fact Sheet for Healthcare Providers: https://www.woods-mathews.com/ This test is not yet approved or cleared by the [place] FDA and  has been authorized for detection and/or diagnosis of SARS-CoV-2 by FDA under an Emergency Use Authorization (EUA). This EUA will remain  in effect (meaning this test can be used) for the duration of the COVID-19 declaration under Section 56 4(b)(1) of the Act, 21 U.S.C. section 360bbb-3(b)(1), unless the authorization is terminated or revoked sooner. Performed at [facility], [place] [address], [place], [place] [address]          Radiology Studies: No results found.      Scheduled Meds:  aspirin EC  81 mg Oral Q supper   B-complex with vitamin C  1 tablet Oral Daily   cholecalciferol  1,000 Units Oral Daily   dicyclomine  10 mg Oral TID AC & HS   dutasteride  0.5 mg  Oral QPM   enoxaparin (LOVENOX) injection  40 mg Subcutaneous Q24H   feeding supplement  1 Container Oral TID BM   fidaxomicin  200 mg Oral BID   irbesartan  300 mg Oral Daily   mirabegron ER  50 mg Oral Q supper   rosuvastatin  20 mg Oral Q supper   saccharomyces boulardii  250 mg Oral BID   tamsulosin  0.4 mg Oral QPM   vancomycin  125 mg Oral QID   Followed by   Derrill Memo ON 04/01/2019] vancomycin  125 mg Oral BID   Followed by   Derrill Memo ON 04/09/2019] vancomycin  125 mg Oral Daily   Followed by   Derrill Memo ON 04/16/2019] vancomycin  125 mg Oral QODAY   Followed by   Derrill Memo ON 05/14/2019] vancomycin  125 mg Oral Q3 days   Continuous Infusions:  sodium chloride 75 mL/hr at 03/23/19 1659     LOS: 6 days    Time spent: 35 minutes.     Elmarie Shiley, MD [facility] Pager [phone]  If 7PM-7AM, please contact night-coverage [email or web address] Password [facility] 03/24/2019, 2:55 PM

## 2019-03-24 NOTE — Progress Notes (Signed)
     [facility] Progress Note  CC:  Cdiff colitis  Subjective:  K+ 3.2 today.  Per nursing, stools still mostly liquid/watery.  Has been leaking around the flexi-seal a couple of times this morning though so ? If more solid stool in rectum.  Objective:  Vital signs in last 24 hours: Temp:  [98.4 F (36.9 C)-98.8 F (37.1 C)] 98.8 F (37.1 C) (08/13 0529) Pulse Rate:  [66-74] 68 (08/13 0529) Resp:  [16-20] 18 (08/13 0529) BP: (116-139)/(68-76) 116/68 (08/13 0529) SpO2:  [98 %-99 %] 98 % (08/13 0529) Last BM Date: 03/23/19 General:  Alert, Well-developed, in NAD Heart:  Regular rate and rhythm; no murmurs Pulm:  CTAB.  No increased WOB. Abdomen:  Soft, non-distended.  BS present and hyperactive.  Non-tender. Extremities:  Without edema. Neurologic:  Alert and oriented x 4;  grossly normal neurologically. Psych:  Alert and cooperative. Normal mood and affect.  Intake/Output from previous day: 08/12 0701 - 08/13 0700 In: 1970.8 [P.O.:1182; I.V.:788.8] Out: 1950 [Urine:1950]  Lab Results: Recent Labs    03/21/19 1148 03/24/19 0535  WBC 10.1 9.2  HGB 10.9* 10.5*  HCT 34.2* 33.9*  PLT 278 299   BMET Recent Labs    03/22/19 0445 03/23/19 0543 03/24/19 0535  NA 136 139 139  K 3.0* 3.6 3.2*  CL 104 109 108  CO2 23 24 25   GLUCOSE 112* 107* 105*  BUN 14 14 11   CREATININE 0.88 0.87 0.81  CALCIUM 7.5* 7.6* 7.6*   Assessment / Plan: #55 85 year old white male with relapsing C. difficile colitis with moderate diffuse colitis on imaging, admitted with leukocytosis, acute kidney injury and hypokalemia.  He is on oral vancomycin.  Received IV flagyl but that was discontinued after 5 days.  Added Dificid 8/12. Plan was for long tapered course of oral vancomycin over 8 to 12 weeks.  ? To continue Dificid to complete course as well. ContinueFlorastor twice daily. Added Bentyl 10 mg ACHS on 8/11 for rectal spasm complaint, which has possibly helped somewhat.   Thus far continues with mostly liquid stool.  Fecal transplant not possible due to Covid.  Dr. Carlean Purl suggested newer monoclonal Ab, Zinplava, if continues with minimal improvement.  I messaged pharmacist here at [facility] about that medication.  Pharmacy says that Dificid would only be $45 for patient through the specialty pharmacy for this patient if continued upon discharge.  Regarding Zinplava, not available inpatient.  Would like need prior auth and then could be given at short stay/outpatient.  #2 hypokalemia-K+ at 3.2 this AM  #3 acute kidney injury-resolved   LOS: 6 days   Laban Emperor. Lesly Pontarelli  03/24/2019, 9:12 AM

## 2019-03-24 NOTE — Progress Notes (Addendum)
Initial Nutrition Assessment  INTERVENTION:   Provide Boost Breeze po TID, each supplement provides 250 kcal and 9 grams of protein  NUTRITION DIAGNOSIS:   Increased nutrient needs related to acute illness(c.diff) as evidenced by estimated needs.  GOAL:   Patient will meet greater than or equal to 90% of their needs  MONITOR:   PO intake, Supplement acceptance, Labs, Weight trends, I & O's  REASON FOR ASSESSMENT:   Consult Assessment of nutrition requirement/status  ASSESSMENT:   85 year old man PMH C. difficile x2 treated with 2 weeks of vancomycin each time, presented with 2 weeks of diarrhea, left-sided abdominal pain, poor appetite, weakness.  CT abdomen pelvis showed pancolitis.  8/7 admitted  **RD working remotely**  Patient currently with variable intakes. On 8/11 consumed 25-100% of meals, 8/12 consumed 45-100% of meals (plus a [employer] milkshake) and this morning he ate 100% of his breakfast (providing ~850 kcal and 24g protein). Will add Boost Breeze as well given increased needs from c.diff and diarrhea.   Per weight records, no weight has been measured since admission 8/7. Pt has lost 14 lbs since 6/9 (8% wt loss x 2 months, significant for time frame).  Medications: D complex with Vitamin C tablet daily, Vitamin D tablet daily, Florastor capsule BID Labs reviewed:  Low K Mg WNL  NUTRITION - FOCUSED PHYSICAL EXAM:  Unable to perform -working remotely.  Diet Order:   Diet Order            DIET SOFT Room service appropriate? Yes; Fluid consistency: Thin  Diet effective now              EDUCATION NEEDS:   No education needs have been identified at this time  Skin:  Skin Assessment: Reviewed RN Assessment  Last BM:  8/13 -type 6  Height:   Ht Readings from Last 1 Encounters:  03/18/19 6' 2"  (1.88 m)    Weight:   Wt Readings from Last 1 Encounters:  03/18/19 73 kg    Ideal Body Weight:  86.3 kg  BMI:  Body mass index is 20.67  kg/m.  Estimated Nutritional Needs:   Kcal:  1800-2000  Protein:  90-100g  Fluid:  2L/day   Clayton Bibles, MS, RD, LDN [facility] Pager: [phone] After Hours Pager: [phone]

## 2019-03-25 ENCOUNTER — Inpatient Hospital Stay (HOSPITAL_COMMUNITY): Payer: Medicare Other

## 2019-03-25 LAB — BASIC METABOLIC PANEL
Anion gap: 8 (ref 5–15)
BUN: 10 mg/dL (ref 8–23)
CO2: 23 mmol/L (ref 22–32)
Calcium: 7.4 mg/dL — ABNORMAL LOW (ref 8.9–10.3)
Chloride: 105 mmol/L (ref 98–111)
Creatinine, Ser: 0.79 mg/dL (ref 0.61–1.24)
GFR calc Af Amer: >60 mL/min (ref >60)
GFR calc non Af Amer: >60 mL/min (ref >60)
Glucose, Bld: 93 mg/dL (ref 70–99)
Potassium: 3.7 mmol/L (ref 3.5–5.1)
Sodium: 136 mmol/L (ref 135–145)

## 2019-03-25 LAB — MAGNESIUM: Magnesium: 1.8 mg/dL (ref 1.7–2.4)

## 2019-03-25 LAB — CBC
HCT: 35.7 % — ABNORMAL LOW (ref 39.0–52.0)
Hemoglobin: 10.8 g/dL — ABNORMAL LOW (ref 13.0–17.0)
MCH: 28.6 pg (ref 26.0–34.0)
MCHC: 30.3 g/dL (ref 30.0–36.0)
MCV: 94.4 fL (ref 80.0–100.0)
Platelets: 339 10*3/uL (ref 150–400)
RBC: 3.78 MIL/uL — ABNORMAL LOW (ref 4.22–5.81)
RDW: 14 % (ref 11.5–15.5)
WBC: 9.4 10*3/uL (ref 4.0–10.5)
nRBC: 0 % (ref 0.0–0.2)

## 2019-03-25 MED ORDER — HYDRALAZINE HCL 20 MG/ML IJ SOLN: 10.0000 mg | Freq: Four times a day (QID) | INTRAMUSCULAR | Status: DC | PRN

## 2019-03-25 MED ORDER — VANCOMYCIN 50 MG/ML ORAL SOLUTION: 250.0000 mg | Freq: Two times a day (BID) | ORAL | Status: DC

## 2019-03-25 MED ORDER — VANCOMYCIN 50 MG/ML ORAL SOLUTION: 250.0000 mg | ORAL | Status: DC

## 2019-03-25 MED ORDER — VANCOMYCIN 50 MG/ML ORAL SOLUTION: 125.0000 mg | Freq: Four times a day (QID) | ORAL | Status: DC

## 2019-03-25 MED ORDER — VANCOMYCIN 50 MG/ML ORAL SOLUTION: 250.0000 mg | Freq: Every day | ORAL | Status: DC

## 2019-03-25 MED ORDER — VANCOMYCIN 50 MG/ML ORAL SOLUTION
250.0000 mg | Freq: Four times a day (QID) | ORAL | Status: DC
  Administered 2019-03-25 – 2019-03-31 (×24): 250 mg via ORAL
  Filled 2019-03-25 (×26): qty 5

## 2019-03-25 MED ORDER — IPRATROPIUM-ALBUTEROL 0.5-2.5 (3) MG/3ML IN SOLN
3.0000 mL | RESPIRATORY_TRACT | Status: DC | PRN
  Administered 2019-03-25: 3 mL via RESPIRATORY_TRACT
  Filled 2019-03-25: qty 3

## 2019-03-25 MED ORDER — FUROSEMIDE 10 MG/ML IJ SOLN
40.0000 mg | Freq: Once | INTRAMUSCULAR | Status: AC
  Administered 2019-03-25: 40 mg via INTRAVENOUS
  Filled 2019-03-25: qty 4

## 2019-03-25 NOTE — Evaluation (Signed)
Physical Therapy Evaluation Patient Details Name: [name] [name] MRN: [id number] DOB: August 04, 1930 Today's Date: 03/25/2019   History of Present Illness  85 year old white male with relapsing C. difficile colitis with moderate diffuse colitis on imaging, admitted with leukocytosis, acute kidney injury and hypokalemia.  Clinical Impression  Pt admitted with above diagnosis.  Pt agreeable to be OOB, he declined attempt to amb d/t feeling too weak. Will continue to follow in acute setting. Pt will benefit from SNF post acute   Pt currently with functional limitations due to the deficits listed below (see PT Problem List). Pt will benefit from skilled PT to increase their independence and safety with mobility to allow discharge to the venue listed below.       Follow Up Recommendations SNF    Equipment Recommendations  None recommended by PT    Recommendations for Other Services       Precautions / Restrictions Precautions Precautions: Fall;Other (comment) Precaution Comments: contact precautions- C Diff Restrictions Weight Bearing Restrictions: No      Mobility  Bed Mobility Overal bed mobility: Needs Assistance Bed Mobility: Supine to Sit     Supine to sit: Min assist;Min guard;HOB elevated     General bed mobility comments: min/guard for trunk, incr time  Transfers                    Ambulation/Gait                Stairs            Wheelchair Mobility    Modified Rankin (Stroke Patients Only)       Balance Overall balance assessment: Needs assistance Sitting-balance support: Feet supported Sitting balance-Leahy Scale: Good       Standing balance-Leahy Scale: Poor Standing balance comment: reliant on UEs for balance and assist from PT                             Pertinent Vitals/Pain Pain Assessment: No/denies pain    Home Living Family/patient expects to be discharged to:: Private residence Living Arrangements:  Spouse/significant other Available Help at Discharge: Family Type of Home: Independent living facility Home Access: Level entry     Home Layout: One level Home Equipment: Cane - single point;Grab bars - toilet;Grab bars - tub/shower      Prior Function Level of Independence: Independent         Comments: amb with hiking stick     Hand Dominance        Extremity/Trunk Assessment   Upper Extremity Assessment Upper Extremity Assessment: Defer to OT evaluation;Generalized weakness    Lower Extremity Assessment Lower Extremity Assessment: Generalized weakness       Communication   Communication: No difficulties  Cognition Arousal/Alertness: Awake/alert Behavior During Therapy: WFL for tasks assessed/performed Overall Cognitive Status: Within Functional Limits for tasks assessed                                        General Comments      Exercises     Assessment/Plan    PT Assessment Patient needs continued PT services  PT Problem List Decreased strength;Decreased activity tolerance;Decreased mobility;Pain;Decreased balance;Decreased knowledge of use of DME       PT Treatment Interventions DME instruction;Gait training;Functional mobility training;Therapeutic exercise;Therapeutic activities;Patient/family education    PT Goals (Current goals  can be found in the Care Plan section)  Acute Rehab PT Goals Patient Stated Goal: get better PT Goal Formulation: With patient Time For Goal Achievement: 04/08/19 Potential to Achieve Goals: Good    Frequency Min 2X/week   Barriers to discharge        Co-evaluation               AM-PAC PT "6 Clicks" Mobility  Outcome Measure Help needed turning from your back to your side while in a flat bed without using bedrails?: A Little Help needed moving from lying on your back to sitting on the side of a flat bed without using bedrails?: A Little Help needed moving to and from a bed to a chair  (including a wheelchair)?: A Little Help needed standing up from a chair using your arms (e.g., wheelchair or bedside chair)?: A Little Help needed to walk in hospital room?: A Little Help needed climbing 3-5 steps with a railing? : A Lot 6 Click Score: 17    End of Session Equipment Utilized During Treatment: Gait belt Activity Tolerance: Patient tolerated treatment well Patient left: with call bell/phone within reach;in chair(RN aware)   PT Visit Diagnosis: Difficulty in walking, not elsewhere classified (R26.2);Muscle weakness (generalized) (M62.81)    Time: 0867-6195 PT Time Calculation (min) (ACUTE ONLY): 28 min   Charges:   PT Evaluation $PT Eval Low Complexity: 1 Low PT Treatments $Therapeutic Activity: 8-22 mins        Kenyon Ana, PT  Pager: [phone] Acute Rehab Dept [facility]): [phone]   03/25/2019   [facility] 03/25/2019, 4:58 PM

## 2019-03-25 NOTE — Progress Notes (Addendum)
[name] Progress Note  CC:  Cdiff colitis  Subjective:  Feels about the same.  Nursing indicates some thickening of the stool contents and asking about trying to pull the flexiseal.  Stool in bag appears very liquidy, but in tube looks thicker.  He would like to OOB to chair.  Objective:  Vital signs in last 24 hours: Temp:  [97.6 F (36.4 C)-98.7 F (37.1 C)] 98.7 F (37.1 C) (08/14 0639) Pulse Rate:  [51-81] 51 (08/14 0639) Resp:  [15-16] 16 (08/14 0639) BP: (144-153)/(86-94) 153/94 (08/14 0639) SpO2:  [95 %-97 %] 96 % (08/14 0639) Last BM Date: 03/23/19 General:  Alert, , in NAD Heart:  Regular rate and rhythm; no murmurs Pulm:  CTAB.  No increased WOB. Abdomen:  Soft, non-distended.  BS present.  Non-tender. Extremities:  Without edema. Neurologic:  Alert and oriented x 4;  grossly normal neurologically. Psych:  Alert and cooperative. Normal mood and affect.  Intake/Output from previous day: 08/13 0701 - 08/14 0700 In: 480 [P.O.:480] Out: 750 [Urine:750]  Lab Results: Recent Labs    03/24/19 0535 03/25/19 0538  WBC 9.2 9.4  HGB 10.5* 10.8*  HCT 33.9* 35.7*  PLT 299 339   BMET Recent Labs    03/23/19 0543 03/24/19 0535 03/25/19 0538  NA 139 139 136  K 3.6 3.2* 3.7  CL 109 108 105  CO2 24 25 23   GLUCOSE 107* 105* 93  BUN 14 11 10   CREATININE 0.87 0.81 0.79  CALCIUM 7.6* 7.6* 7.4*   Assessment / Plan: #16 85 year old white male with relapsing C. difficile colitis with moderate diffuse colitis on imaging, admitted with leukocytosis, acute kidney injury and hypokalemia.  He is on oral vancomycin 125 mg four times daily. Received IV flagyl but that was discontinued after 5 days. Added Dificid 8/12. Plan was for long tapered course of oral vancomycin over 8 to 12 weeks.  ? To continue Dificid to complete course as well. ContinueFlorastor twice daily. AddedBentyl 10 mg ACHS on 8/11for rectal spasm complaint, which has possibly  helped somewhat.  Thus far continues with mostly liquid stool, but appears to be thickening.  Fecal transplant not possible due to Covid. Dr. Carlean Purl suggested newer monoclonal Ab, Zinplava, if continues with minimal improvement. I messaged pharmacist here at [facility] about that medication.  Pharmacy says that Dificid would only be $45 for patient through the specialty pharmacy for this patient if continued upon discharge.  Regarding Zinplava, not available inpatient.  Would like need prior auth and then could be given at short stay/outpatient.  #2 hypokalemia-K+ at3.7this AM  #3 acute kidney injury-resolved  *Will have nursing remove rectal tube today and see how he does.  Ok to replace if still with too many liquid stools, however. *OOB to chair if possible.  PT when diarrhea improved and able to participate. *Dr. Henrene Pastor would like vancomycin increased to 250 mg four times daily.  I was not able to change the order in Epic but discussed with pharmacy and they have made the change.   LOS: 7 days   [name] [name]. Zehr  03/25/2019, 9:33 AM  GI ATTENDING  Interval history data reviewed.  Patient seen and examined.  Agree with interval progress note as outlined above.  Patient stools seem to be firming up a bit though still with significant output.  They would like to remove the rectal tube and see how it goes.  I think that is fine.  Terms of his medical therapies  I would continue Dificid and INCREASE oral vancomycin to 250 mg 4 times daily.  GI will continue to follow.  [name] [name]., M.D. [facility] Division of Gastroenterology

## 2019-03-25 NOTE — Evaluation (Addendum)
Occupational Therapy Evaluation Patient Details Name: [name] [name] MRN: [id number] DOB: 07/13/1930 Today's Date: 03/25/2019    History of Present Illness 85 year old white male with relapsing C. difficile colitis with moderate diffuse colitis on imaging, admitted with leukocytosis, acute kidney injury and hypokalemia.   Clinical Impression   Eval limited as pt declined OOB to [facility]. Flexiseal has been removed and pt wants to be on bedpan vs BSC. Pt agreeable to bed level activity. Pt with decline in function and safety wit ADLs and ADL mobility with impaired strength and endurance. Pt lives at [place] with his wife and reports that he was independent with ADLs/selfcare and used a walking stick/cane for mobility. Pt would benefit from acute OT services to address impairments to maximize level of function and safety    Follow Up Recommendations  SNF    Equipment Recommendations  Other (comment)(TBD at next level of care)    Recommendations for Other Services  PT consult     Precautions / Restrictions Precautions Precautions: Fall;Other (comment) Precaution Comments: contact precautions- C Diff Restrictions Weight Bearing Restrictions: No      Mobility Bed Mobility Overal bed mobility: Needs Assistance Bed Mobility: Rolling Rolling: Min assist         General bed mobility comments: rolling for bed pan use  Transfers                 General transfer comment: Flexiseal removed, pt on bed pan and refusing OOB to [facility] activity    Balance                                           ADL either performed or assessed with clinical judgement   ADL Overall ADL's : Needs assistance/impaired Eating/Feeding: Bed level;Independent Eating/Feeding Details (indicate cue type and reason): HOB raised Grooming: Wash/dry hands;Wash/dry face;Bed level;Set up;Supervision/safety   Upper Body Bathing: Bed level;Set up;Supervision/ safety Upper Body Bathing  Details (indicate cue type and reason): simulated Lower Body Bathing: Maximal assistance;Bed level Lower Body Bathing Details (indicate cue type and reason): simulated Upper Body Dressing : Supervision/safety;Set up;Bed level   Lower Body Dressing: Total assistance     Toilet Transfer Details (indicate cue type and reason): NT, pt declined being OOB Toileting- Clothing Manipulation and Hygiene: Bed level         General ADL Comments: Flexiseal removed, pt on bed pan and refusing OOB to [facility] activity     Vision Baseline Vision/History: Wears glasses Wears Glasses: At all times Patient Visual Report: No change from baseline       Perception     Praxis      Pertinent Vitals/Pain Pain Assessment: No/denies pain     Hand Dominance Right   Extremity/Trunk Assessment Upper Extremity Assessment Upper Extremity Assessment: Generalized weakness   Lower Extremity Assessment Lower Extremity Assessment: Defer to PT evaluation       Communication Communication Communication: No difficulties   Cognition Arousal/Alertness: Awake/alert Behavior During Therapy: WFL for tasks assessed/performed Overall Cognitive Status: Within Functional Limits for tasks assessed                                     General Comments       Exercises     Shoulder Instructions      Home Living Family/patient  expects to be discharged to:: Private residence   Available Help at Discharge: Family Type of Home: Independent living facility([facility]) Home Access: Level entry     Home Layout: One level     Bathroom Shower/Tub: Occupational psychologist: Handicapped height     [place]: [place] - single point;Grab bars - toilet;Grab bars - tub/shower          Prior Functioning/Environment Level of Independence: Independent                 OT Problem List: Decreased strength;Decreased activity tolerance;Decreased knowledge of use of DME or AE       OT Treatment/Interventions: Self-care/ADL training;DME and/or AE instruction;Therapeutic activities;Therapeutic exercise;Patient/family education    OT Goals(Current goals can be found in the care plan section) Acute Rehab OT Goals Patient Stated Goal: get better OT Goal Formulation: With patient Time For Goal Achievement: 04/01/19 Potential to Achieve Goals: Good ADL Goals Pt Will Perform Grooming: with supervision;with set-up;sitting;with min guard assist Pt Will Perform Upper Body Bathing: with min guard assist;with supervision;with set-up;sitting Pt Will Perform Lower Body Bathing: with mod assist;sitting/lateral leans Pt Will Perform Upper Body Dressing: with min assist;with min guard assist;with supervision;with set-up;sitting Pt Will Transfer to Toilet: with mod assist;bedside commode Pt Will Perform Toileting - Clothing Manipulation and hygiene: with mod assist;sit to/from stand  OT Frequency: Min 2X/week   Barriers to D/C:            Co-evaluation              AM-PAC OT "6 Clicks" Daily Activity     Outcome Measure Help from another person eating meals?: None Help from another person taking care of personal grooming?: A Little Help from another person toileting, which includes using toliet, bedpan, or urinal?: Total Help from another person bathing (including washing, rinsing, drying)?: A Lot Help from another person to put on and taking off regular upper body clothing?: A Little Help from another person to put on and taking off regular lower body clothing?: Total 6 Click Score: 14   End of Session    Activity Tolerance:   Patient left: in bed;with bed alarm set;with call bell/phone within reach  OT Visit Diagnosis: Muscle weakness (generalized) (M62.81);Other abnormalities of gait and mobility (R26.89)                Time: 1660-6301 OT Time Calculation (min): 22 min Charges:  OT General Charges $OT Visit: 1 Visit OT Evaluation $OT Eval Moderate  Complexity: 1 Mod    Britt Bottom 03/25/2019, 12:19 PM

## 2019-03-25 NOTE — Progress Notes (Signed)
PROGRESS NOTE    [name] [name]  OYD:[id number] DOB: [birth year] DOA: 03/18/2019 PCP: [name], MD   Brief Narrative: 85 year old with past medical history significant for C. difficile x2 treated with 2 weeks of vancomycin each time, presented with 2 weeks of diarrhea, left-sided abdominal pain, poor appetite, weakness.  CT abdomen and pelvis showed pancolitis.  Continue to treat for C. difficile pancolitis.   Assessment & Plan:   Principal Problem:   C. difficile colitis Active Problems:   Hypokalemia   Acute kidney injury ([place])   Pancolitis (H[facility])   Moderate malnutrition ([place])  1-C.  Difficile colitis with pancolitis Recurrent episode. Pneumobilia and biliary dilation seen on CT consistent with prior ERCP, and sphincterotomy.  Of note patient is a s/p ERCP 02/2019. Continue with oral vancomycin, Dificid added on 8/12 Management  per Dr. Carlean Purl Stool getting thicker. Plan to resume flexy seal and monitor.  Vancomycin increase dose 250 four times a day.    AKI: Resolved with IV fluids Will hold hydrochlorothiazide for now. On iv fluids.   Hypokalemia, hypomagnesemia k normal today.  IV magnesium.   Hypertension: Continue with irbesartan Hold hydrochlorothiazide for now   Estimated body mass index is 20.67 kg/m as calculated from the following:   Height as of this encounter: 6' 2"  (1.88 m).   Weight as of this encounter: 73 kg.   DVT prophylaxis: Lovenox Code Status: Full code Family Communication: Dr. Alcario Drought updating family Disposition Plan: Remain in the hospital for treatment of C. difficile colitis Consultants:   GI  Procedures:   None  Antimicrobials:  Oral vancomycin  Subjective: Rectal tube out, so far 3 BM today   Objective: Vitals:   03/24/19 1325 03/24/19 2141 03/25/19 0639 03/25/19 1100  BP: (!) 144/94 (!) 152/86 (!) 153/94 (!) 151/101  Pulse: (!) 58 81 (!) 51 (!) 50  Resp: 15 16 16 16   Temp: 97.6 F (36.4 C) 98.5 F (36.9 C)  98.7 F (37.1 C) 98.3 F (36.8 C)  TempSrc: Oral Oral Oral Oral  SpO2: 97% 95% 96% 95%  Weight:      Height:        Intake/Output Summary (Last 24 hours) at 03/25/2019 1542 Last data filed at 03/25/2019 0640 Gross per 24 hour  Intake -  Output 400 ml  Net -400 ml   Filed Weights   03/18/19 1016  Weight: 73 kg    Examination:  General exam: NAD Respiratory system: CTA Cardiovascular system: S 1.. S 2 RRR Gastrointestinal system; BS present, soft, nt Central nervous system: Non focal.  Extremities: Symmetric power.  Skin: no rashes.    Data Reviewed: I have personally reviewed following labs and imaging studies  CBC: Recent Labs  Lab 03/19/19 0519 03/21/19 1148 03/24/19 0535 03/25/19 0538  WBC 17.3* 10.1 9.2 9.4  NEUTROABS  --  8.5*  --   --   HGB 10.6* 10.9* 10.5* 10.8*  HCT 33.9* 34.2* 33.9* 35.7*  MCV 92.9 91.7 92.9 94.4  PLT 231 278 299 672   Basic Metabolic Panel: Recent Labs  Lab 03/20/19 0529 03/22/19 0445 03/23/19 0543 03/24/19 0535 03/25/19 0538  NA 139 136 139 139 136  K 3.2* 3.0* 3.6 3.2* 3.7  CL 111 104 109 108 105  CO2 22 23 24 25 23   GLUCOSE 107* 112* 107* 105* 93  BUN 23 14 14 11 10   CREATININE 0.93 0.88 0.87 0.81 0.79  CALCIUM 7.5* 7.5* 7.6* 7.6* 7.4*  MG 2.0 1.6* 1.8 1.7  1.8  PHOS 2.9  --   --   --   --    GFR: Estimated Creatinine Clearance: 68.4 mL/min (by C-G formula based on SCr of 0.79 mg/dL). Liver Function Tests: Recent Labs  Lab 03/19/19 0519 03/20/19 0529  AST 17 14*  ALT 15 13  ALKPHOS 80 65  BILITOT 1.2 0.6  PROT 5.1* 4.6*  ALBUMIN 2.3* 2.1*   No results for input(s): LIPASE, AMYLASE in the last 168 hours. No results for input(s): AMMONIA in the last 168 hours. Coagulation Profile: No results for input(s): INR, PROTIME in the last 168 hours. Cardiac Enzymes: No results for input(s): CKTOTAL, CKMB, CKMBINDEX, TROPONINI in the last 168 hours. BNP (last 3 results) No results for input(s): PROBNP in the last  8760 hours. HbA1C: No results for input(s): HGBA1C in the last 72 hours. CBG: No results for input(s): GLUCAP in the last 168 hours. Lipid Profile: No results for input(s): CHOL, HDL, LDLCALC, TRIG, CHOLHDL, LDLDIRECT in the last 72 hours. Thyroid Function Tests: No results for input(s): TSH, T4TOTAL, FREET4, T3FREE, THYROIDAB in the last 72 hours. Anemia Panel: No results for input(s): VITAMINB12, FOLATE, FERRITIN, TIBC, IRON, RETICCTPCT in the last 72 hours. Sepsis Labs: No results for input(s): PROCALCITON, LATICACIDVEN in the last 168 hours.  Recent Results (from the past 240 hour(s))  C difficile quick scan w PCR reflex     Status: Abnormal   Collection Time: 03/18/19 11:03 AM   Specimen: STOOL  Result Value Ref Range Status   C Diff antigen POSITIVE (A) NEGATIVE Final   C Diff toxin POSITIVE (A) NEGATIVE Final   C Diff interpretation Toxin producing C. difficile detected.  Final    Comment: RESULT CALLED TO, READ BACK BY AND VERIFIED WITH: GRIFFIN, M. RN @1231  ON 8.7.2020 BY [facility] Performed at [facility], [place] [address] [place], [place] [address]   SARS CORONAVIRUS 2 Nasal Swab Aptima Multi Swab     Status: None   Collection Time: 03/18/19  1:54 PM   Specimen: Aptima Multi Swab; Nasal Swab  Result Value Ref Range Status   SARS Coronavirus 2 NEGATIVE NEGATIVE Final    Comment: (NOTE) SARS-CoV-2 target nucleic acids are NOT DETECTED. The SARS-CoV-2 RNA is generally detectable in upper and lower respiratory specimens during the acute phase of infection. Negative results do not preclude SARS-CoV-2 infection, do not rule out co-infections with other pathogens, and should not be used as the sole basis for treatment or other patient management decisions. Negative results must be combined with clinical observations, patient history, and epidemiological information. The expected result is Negative. Fact Sheet for Patients:  SugarRoll.be Fact Sheet for Healthcare Providers: https://www.woods-mathews.com/ This test is not yet approved or cleared by the [place] FDA and  has been authorized for detection and/or diagnosis of SARS-CoV-2 by FDA under an Emergency Use Authorization (EUA). This EUA will remain  in effect (meaning this test can be used) for the duration of the COVID-19 declaration under Section 56 4(b)(1) of the Act, 21 U.S.C. section 360bbb-3(b)(1), unless the authorization is terminated or revoked sooner. Performed at [facility], [place] [address] [place], [place] [address]          Radiology Studies: No results found.      Scheduled Meds: . aspirin EC  81 mg Oral Q supper  . B-complex with vitamin C  1 tablet Oral Daily  . cholecalciferol  1,000 Units Oral Daily  . dicyclomine  10 mg Oral  TID AC & HS  . dutasteride  0.5 mg Oral QPM  . enoxaparin (LOVENOX) injection  40 mg Subcutaneous Q24H  . feeding supplement  1 Container Oral TID BM  . fidaxomicin  200 mg Oral BID  . irbesartan  300 mg Oral Daily  . mirabegron ER  50 mg Oral Q supper  . rosuvastatin  20 mg Oral Q supper  . saccharomyces boulardii  250 mg Oral BID  . tamsulosin  0.4 mg Oral QPM  . vancomycin  250 mg Oral QID   Followed by  . [START ON 04/02/2019] vancomycin  250 mg Oral BID   Followed by  . [START ON 04/09/2019] vancomycin  250 mg Oral Daily   Followed by  . [START ON 04/16/2019] vancomycin  250 mg Oral QODAY   Followed by  . [START ON 05/14/2019] vancomycin  250 mg Oral Q3 days   Continuous Infusions: . sodium chloride 50 mL/hr at 03/24/19 1725     LOS: 7 days    Time spent: 35 minutes.     Elmarie Shiley, MD [facility] Pager [phone]  If 7PM-7AM, please contact night-coverage [email or web address] Password [email or web address] 03/25/2019, 3:42 PM

## 2019-03-26 LAB — BASIC METABOLIC PANEL
Anion gap: 7 (ref 5–15)
BUN: 10 mg/dL (ref 8–23)
CO2: 26 mmol/L (ref 22–32)
Calcium: 7.7 mg/dL — ABNORMAL LOW (ref 8.9–10.3)
Chloride: 105 mmol/L (ref 98–111)
Creatinine, Ser: 0.77 mg/dL (ref 0.61–1.24)
GFR calc Af Amer: >60 mL/min (ref >60)
GFR calc non Af Amer: >60 mL/min (ref >60)
Glucose, Bld: 107 mg/dL — ABNORMAL HIGH (ref 70–99)
Potassium: 3.1 mmol/L — ABNORMAL LOW (ref 3.5–5.1)
Sodium: 138 mmol/L (ref 135–145)

## 2019-03-26 LAB — CBC
HCT: 36.8 % — ABNORMAL LOW (ref 39.0–52.0)
Hemoglobin: 11.6 g/dL — ABNORMAL LOW (ref 13.0–17.0)
MCH: 28.9 pg (ref 26.0–34.0)
MCHC: 31.5 g/dL (ref 30.0–36.0)
MCV: 91.8 fL (ref 80.0–100.0)
Platelets: 376 10*3/uL (ref 150–400)
RBC: 4.01 MIL/uL — ABNORMAL LOW (ref 4.22–5.81)
RDW: 14.4 % (ref 11.5–15.5)
WBC: 11.7 10*3/uL — ABNORMAL HIGH (ref 4.0–10.5)
nRBC: 0 % (ref 0.0–0.2)

## 2019-03-26 LAB — HEPATIC FUNCTION PANEL
ALT: 25 U/L (ref 0–44)
AST: 27 U/L (ref 15–41)
Albumin: 2.4 g/dL — ABNORMAL LOW (ref 3.5–5.0)
Alkaline Phosphatase: 58 U/L (ref 38–126)
Bilirubin, Direct: 0.2 mg/dL (ref 0.0–0.2)
Indirect Bilirubin: 0.3 mg/dL (ref 0.3–0.9)
Total Bilirubin: 0.5 mg/dL (ref 0.3–1.2)
Total Protein: 5.2 g/dL — ABNORMAL LOW (ref 6.5–8.1)

## 2019-03-26 MED ORDER — GERHARDT'S BUTT CREAM
TOPICAL_CREAM | Freq: Four times a day (QID) | CUTANEOUS | Status: DC
  Administered 2019-03-26 – 2019-03-28 (×9): via TOPICAL
  Administered 2019-03-28: 1 via TOPICAL
  Administered 2019-03-29 – 2019-03-31 (×7): via TOPICAL
  Filled 2019-03-26: qty 1

## 2019-03-26 MED ORDER — POTASSIUM CHLORIDE CRYS ER 20 MEQ PO TBCR
40.0000 meq | EXTENDED_RELEASE_TABLET | Freq: Two times a day (BID) | ORAL | Status: AC
  Administered 2019-03-26 (×2): 40 meq via ORAL
  Filled 2019-03-26 (×2): qty 2

## 2019-03-26 MED ORDER — FUROSEMIDE 10 MG/ML IJ SOLN
40.0000 mg | Freq: Once | INTRAMUSCULAR | Status: AC
  Administered 2019-03-26: 40 mg via INTRAVENOUS
  Filled 2019-03-26: qty 4

## 2019-03-26 MED ORDER — COLESTIPOL HCL 1 G PO TABS
1.0000 g | ORAL_TABLET | Freq: Two times a day (BID) | ORAL | Status: DC
  Administered 2019-03-26 – 2019-03-31 (×10): 1 g via ORAL
  Filled 2019-03-26 (×11): qty 1

## 2019-03-26 MED ORDER — POTASSIUM CHLORIDE CRYS ER 20 MEQ PO TBCR
40.0000 meq | EXTENDED_RELEASE_TABLET | Freq: Once | ORAL | Status: AC
  Administered 2019-03-26: 40 meq via ORAL
  Filled 2019-03-26: qty 2

## 2019-03-26 NOTE — Progress Notes (Addendum)
[facility] Progress Note  CC:  Cdiff colitis  Subjective:  Stool seems to be thickening slightly but still with a lot of watery consistency and has needed cleaning 7 or 8 times since 7 PM yesterday.  Nursing staff says that bottom and scrotal areas are raw and irritated.  They have orders for butt cream, etc.  Getting cleaned up during my visit so not examined today.  Objective:  Vital signs in last 24 hours: Temp:  [97.8 F (36.6 C)-98.3 F (36.8 C)] 98.2 F (36.8 C) (08/15 0448) Pulse Rate:  [50-88] 76 (08/15 0448) Resp:  [16-20] 20 (08/15 0448) BP: (146-156)/(98-126) 146/98 (08/15 0448) SpO2:  [95 %-100 %] 96 % (08/15 0448) Last BM Date: 03/25/19   General:  Alert, Well-developed, in NAD  Intake/Output from previous day: 08/14 0701 - 08/15 0700 In: -  Out: 2800 [Urine:2800]  Lab Results: Recent Labs    03/24/19 0535 03/25/19 0538 03/26/19 0552  WBC 9.2 9.4 11.7*  HGB 10.5* 10.8* 11.6*  HCT 33.9* 35.7* 36.8*  PLT 299 339 376   BMET Recent Labs    03/24/19 0535 03/25/19 0538 03/26/19 0552  NA 139 136 138  K 3.2* 3.7 3.1*  CL 108 105 105  CO2 25 23 26   GLUCOSE 105* 93 107*  BUN 11 10 10   CREATININE 0.81 0.79 0.77  CALCIUM 7.6* 7.4* 7.7*   LFT Recent Labs    03/26/19 0552  PROT 5.2*  ALBUMIN 2.4*  AST 27  ALT 25  ALKPHOS 58  BILITOT 0.5  BILIDIR 0.2  IBILI 0.3   Dg Chest Port 1 View  Result Date: 03/25/2019 CLINICAL DATA:  Wheezing. EXAM: PORTABLE CHEST 1 VIEW COMPARISON:  Radiograph 03/21/2019 FINDINGS: Progressive hazy bibasilar opacities, right greater than left, likely increasing pleural effusions. Post median sternotomy with stable cardiomegaly. Progressive interstitial thickening with Kerley B-lines consistent with pulmonary edema. No pneumothorax. IMPRESSION: Pulmonary edema with progressive bilateral pleural effusions over the past 4 days consistent with fluid overload/CHF. Electronically Signed   By: Keith Rake M.D.    On: 03/25/2019 21:38   Assessment / Plan: #23 85 year old white male with relapsing C. difficile colitis with moderate diffuse colitis on imaging, admitted with leukocytosis, acute kidney injury and hypokalemia.  He was on oral vancomycin 125 mg four times daily. Received IV flagyl but that was discontinued after 5 days. Added Dificid 8/12. Planwas for long tapered course of oral vancomycin over 8 to 12 weeks. ContinueFlorastor twice daily. AddedBentyl 10 mg ACHS on 8/11for rectal spasm complaint, which has possibly helped somewhat.  Vancomycin increased to 250 mg dosing on 8/14.  Flexiseal was removed on 8/14, but still with multiple stools with mostly liquid consistency.  Will replace that today; discussed with nursing.  Fecal transplant not possible due to Covid. Dr. Carlean Purl suggested newer monoclonal Ab, Zinplava, if continues with minimal improvement. I messaged pharmacist here at [facility] about that medication.Pharmacy says that Dificid would only be $45 for patient through the specialty pharmacy for this patientif continued upon discharge. Regarding Zinplava, not available inpatient. Would like need prior auth and then could be given at short stay/outpatient.  #2 hypokalemia-K+ at3.1again this AM.  Likely due to GI losses.  #3 acute kidney injury-resolved    LOS: 8 days   Laban Emperor. Zehr  03/26/2019, 8:44 AM    Attending physician's note   I have taken an interval history, reviewed the chart and examined the patient. I agree with the Advanced Practitioner's  note, impression and recommendations.   Continues to have increased frequency, slightly more formed per patient. Continue oral vancomycin  250 mg every 6 hours and Dificid  Will add Colestid 1 g twice daily, some data to suggest added benefit with anionic binding resin.  Empiric trial for 3 to 5 days.  If no improvement will discontinue.   Damaris Hippo , MD [phone]

## 2019-03-26 NOTE — Progress Notes (Signed)
PROGRESS NOTE    [name] [name]  ZDG:[id number] DOB: [birth year] DOA: 03/18/2019 PCP: [name], MD   Brief Narrative: 85 year old with past medical history significant for C. difficile x2 treated with 2 weeks of vancomycin each time, presented with 2 weeks of diarrhea, left-sided abdominal pain, poor appetite, weakness.  CT abdomen and pelvis showed pancolitis.  Continue to treat for C. difficile pancolitis.    Assessment & Plan:   Principal Problem:   C. difficile colitis Active Problems:   Hypokalemia   Acute kidney injury ([place])   Pancolitis (HCC)   Moderate malnutrition ([place])    1-C.  Difficile colitis with pancolitis Recurrent episode. Pneumobilia and biliary dilation seen on CT consistent with prior ERCP, and sphincterotomy.  Of note patient is a s/p ERCP 02/2019. Continue with oral vancomycin, Dificid added on 8/12 Management  per Dr. Carlean Purl Stool getting thicker. Plan to resume flexy seal and monitor.  Vancomycin increase dose 250 four times a day.  He had 5 BM yesterday.  Today so far 3 since midnight.  Monitor WBC increase to 11 today   Pulmonary edema; Acute Respiratory distress.  Presume Acute on chronic diastolic HF. Patient was SOB last night, wheezing. Chest x ray pulmonary edema, pleural effusion.  Received IV lasix.  Improved this am. Continue with IV lasix.   AKI: Resolved with IV fluids Will hold hydrochlorothiazide for now. Stop fluids.   Hypokalemia, hypomagnesemia k normal today.  IV magnesium.   Hypertension: Continue with irbesartan Hold hydrochlorothiazide for now   Estimated body mass index is 20.67 kg/m as calculated from the following:   Height as of this encounter: 6' 2"  (1.88 m).   Weight as of this encounter: 73 kg.   DVT prophylaxis: Lovenox Code Status: Full code Family Communication: son updated at bedside.  Disposition Plan: Remain in the hospital for treatment of C. difficile colitis Consultants:   GI   Procedures:   None  Antimicrobials:  Oral vancomycin  Subjective: Having multiples BM more than 5 overnight   Objective: Vitals:   03/25/19 1944 03/25/19 2031 03/25/19 2240 03/26/19 0448  BP: (!) 151/118 (!) 156/126  (!) 146/98  Pulse: 88   76  Resp: 18 20  20   Temp: 97.8 F (36.6 C) 98 F (36.7 C)  98.2 F (36.8 C)  TempSrc: Oral Oral  Oral  SpO2: 100% 96% 98% 96%  Weight:      Height:        Intake/Output Summary (Last 24 hours) at 03/26/2019 5956 Last data filed at 03/26/2019 0448 Gross per 24 hour  Intake -  Output 2800 ml  Net -2800 ml   Filed Weights   03/18/19 1016  Weight: 73 kg    Examination:  General exam: NAD Respiratory system: CTA Cardiovascular system: S 1, S 2 RRR Gastrointestinal system; BS present, soft, nt Central nervous system: non focal.  Extremities: symmetric power.  Skin: no rashes.    Data Reviewed: I have personally reviewed following labs and imaging studies  CBC: Recent Labs  Lab 03/21/19 1148 03/24/19 0535 03/25/19 0538 03/26/19 0552  WBC 10.1 9.2 9.4 11.7*  NEUTROABS 8.5*  --   --   --   HGB 10.9* 10.5* 10.8* 11.6*  HCT 34.2* 33.9* 35.7* 36.8*  MCV 91.7 92.9 94.4 91.8  PLT 278 299 339 387   Basic Metabolic Panel: Recent Labs  Lab 03/20/19 0529 03/22/19 0445 03/23/19 0543 03/24/19 0535 03/25/19 0538 03/26/19 0552  NA 139 136 139 139 136  138  K 3.2* 3.0* 3.6 3.2* 3.7 3.1*  CL 111 104 109 108 105 105  CO2 22 23 24 25 23 26   GLUCOSE 107* 112* 107* 105* 93 107*  BUN 23 14 14 11 10 10   CREATININE 0.93 0.88 0.87 0.81 0.79 0.77  CALCIUM 7.5* 7.5* 7.6* 7.6* 7.4* 7.7*  MG 2.0 1.6* 1.8 1.7 1.8  --   PHOS 2.9  --   --   --   --   --    GFR: Estimated Creatinine Clearance: 68.4 mL/min (by C-G formula based on SCr of 0.77 mg/dL). Liver Function Tests: Recent Labs  Lab 03/20/19 0529 03/26/19 0552  AST 14* 27  ALT 13 25  ALKPHOS 65 58  BILITOT 0.6 0.5  PROT 4.6* 5.2*  ALBUMIN 2.1* 2.4*   No results for  input(s): LIPASE, AMYLASE in the last 168 hours. No results for input(s): AMMONIA in the last 168 hours. Coagulation Profile: No results for input(s): INR, PROTIME in the last 168 hours. Cardiac Enzymes: No results for input(s): CKTOTAL, CKMB, CKMBINDEX, TROPONINI in the last 168 hours. BNP (last 3 results) No results for input(s): PROBNP in the last 8760 hours. HbA1C: No results for input(s): HGBA1C in the last 72 hours. CBG: No results for input(s): GLUCAP in the last 168 hours. Lipid Profile: No results for input(s): CHOL, HDL, LDLCALC, TRIG, CHOLHDL, LDLDIRECT in the last 72 hours. Thyroid Function Tests: No results for input(s): TSH, T4TOTAL, FREET4, T3FREE, THYROIDAB in the last 72 hours. Anemia Panel: No results for input(s): VITAMINB12, FOLATE, FERRITIN, TIBC, IRON, RETICCTPCT in the last 72 hours. Sepsis Labs: No results for input(s): PROCALCITON, LATICACIDVEN in the last 168 hours.  Recent Results (from the past 240 hour(s))  C difficile quick scan w PCR reflex     Status: Abnormal   Collection Time: 03/18/19 11:03 AM   Specimen: STOOL  Result Value Ref Range Status   C Diff antigen POSITIVE (A) NEGATIVE Final   C Diff toxin POSITIVE (A) NEGATIVE Final   C Diff interpretation Toxin producing C. difficile detected.  Final    Comment: RESULT CALLED TO, READ BACK BY AND VERIFIED WITH: GRIFFIN, M. RN @1231  ON 8.7.2020 BY [facility] Performed at [facility], [place] [address] [place], [place] [address]   SARS CORONAVIRUS 2 Nasal Swab Aptima Multi Swab     Status: None   Collection Time: 03/18/19  1:54 PM   Specimen: Aptima Multi Swab; Nasal Swab  Result Value Ref Range Status   SARS Coronavirus 2 NEGATIVE NEGATIVE Final    Comment: (NOTE) SARS-CoV-2 target nucleic acids are NOT DETECTED. The SARS-CoV-2 RNA is generally detectable in upper and lower respiratory specimens during the acute phase of infection. Negative results do not preclude SARS-CoV-2  infection, do not rule out co-infections with other pathogens, and should not be used as the sole basis for treatment or other patient management decisions. Negative results must be combined with clinical observations, patient history, and epidemiological information. The expected result is Negative. Fact Sheet for Patients: SugarRoll.be Fact Sheet for Healthcare Providers: https://www.woods-mathews.com/ This test is not yet approved or cleared by the [place] FDA and  has been authorized for detection and/or diagnosis of SARS-CoV-2 by FDA under an Emergency Use Authorization (EUA). This EUA will remain  in effect (meaning this test can be used) for the duration of the COVID-19 declaration under Section 56 4(b)(1) of the Act, 21 U.S.C. section 360bbb-3(b)(1), unless the authorization is terminated or revoked sooner.  Performed at [facility], [place] [address], [place], [place] [address]          Radiology Studies: Dg Chest Port 1 View  Result Date: 03/25/2019 CLINICAL DATA:  Wheezing. EXAM: PORTABLE CHEST 1 VIEW COMPARISON:  Radiograph 03/21/2019 FINDINGS: Progressive hazy bibasilar opacities, right greater than left, likely increasing pleural effusions. Post median sternotomy with stable cardiomegaly. Progressive interstitial thickening with Kerley B-lines consistent with pulmonary edema. No pneumothorax. IMPRESSION: Pulmonary edema with progressive bilateral pleural effusions over the past 4 days consistent with fluid overload/CHF. Electronically Signed   By: Keith Rake M.D.   On: 03/25/2019 21:38        Scheduled Meds: . aspirin EC  81 mg Oral Q supper  . B-complex with vitamin C  1 tablet Oral Daily  . cholecalciferol  1,000 Units Oral Daily  . dicyclomine  10 mg Oral TID AC & HS  . dutasteride  0.5 mg Oral QPM  . enoxaparin (LOVENOX) injection  40 mg Subcutaneous Q24H  . feeding supplement  1 Container Oral TID BM   . fidaxomicin  200 mg Oral BID  . furosemide  40 mg Intravenous Once  . irbesartan  300 mg Oral Daily  . mirabegron ER  50 mg Oral Q supper  . potassium chloride  40 mEq Oral Once  . potassium chloride  40 mEq Oral BID  . rosuvastatin  20 mg Oral Q supper  . saccharomyces boulardii  250 mg Oral BID  . tamsulosin  0.4 mg Oral QPM  . vancomycin  250 mg Oral QID   Followed by  . [START ON 04/02/2019] vancomycin  250 mg Oral BID   Followed by  . [START ON 04/09/2019] vancomycin  250 mg Oral Daily   Followed by  . [START ON 04/16/2019] vancomycin  250 mg Oral QODAY   Followed by  . [START ON 05/14/2019] vancomycin  250 mg Oral Q3 days   Continuous Infusions:    LOS: 8 days    Time spent: 35 minutes.     Elmarie Shiley, MD [facility] Pager [phone]  If 7PM-7AM, please contact night-coverage [email or web address] Password [facility] 03/26/2019, 8:07 AM

## 2019-03-27 LAB — CBC
HCT: 34.4 % — ABNORMAL LOW (ref 39.0–52.0)
Hemoglobin: 10.8 g/dL — ABNORMAL LOW (ref 13.0–17.0)
MCH: 29.3 pg (ref 26.0–34.0)
MCHC: 31.4 g/dL (ref 30.0–36.0)
MCV: 93.5 fL (ref 80.0–100.0)
Platelets: 371 10*3/uL (ref 150–400)
RBC: 3.68 MIL/uL — ABNORMAL LOW (ref 4.22–5.81)
RDW: 14.6 % (ref 11.5–15.5)
WBC: 11 10*3/uL — ABNORMAL HIGH (ref 4.0–10.5)
nRBC: 0 % (ref 0.0–0.2)

## 2019-03-27 LAB — BASIC METABOLIC PANEL
Anion gap: 6 (ref 5–15)
BUN: 11 mg/dL (ref 8–23)
CO2: 27 mmol/L (ref 22–32)
Calcium: 7.8 mg/dL — ABNORMAL LOW (ref 8.9–10.3)
Chloride: 107 mmol/L (ref 98–111)
Creatinine, Ser: 0.85 mg/dL (ref 0.61–1.24)
GFR calc Af Amer: >60 mL/min (ref >60)
GFR calc non Af Amer: >60 mL/min (ref >60)
Glucose, Bld: 97 mg/dL (ref 70–99)
Potassium: 3.8 mmol/L (ref 3.5–5.1)
Sodium: 140 mmol/L (ref 135–145)

## 2019-03-27 LAB — MAGNESIUM: Magnesium: 1.8 mg/dL (ref 1.7–2.4)

## 2019-03-27 MED ORDER — MAGNESIUM SULFATE 2 GM/50ML IV SOLN
2.0000 g | Freq: Once | INTRAVENOUS | Status: AC
  Administered 2019-03-27: 2 g via INTRAVENOUS
  Filled 2019-03-27: qty 50

## 2019-03-27 MED ORDER — FUROSEMIDE 10 MG/ML IJ SOLN
40.0000 mg | Freq: Once | INTRAMUSCULAR | Status: AC
  Administered 2019-03-27: 40 mg via INTRAVENOUS
  Filled 2019-03-27: qty 4

## 2019-03-27 MED ORDER — POTASSIUM CHLORIDE CRYS ER 20 MEQ PO TBCR
40.0000 meq | EXTENDED_RELEASE_TABLET | Freq: Once | ORAL | Status: AC
  Administered 2019-03-27: 09:00:00 40 meq via ORAL
  Filled 2019-03-27: qty 2

## 2019-03-27 MED ORDER — POTASSIUM CHLORIDE CRYS ER 20 MEQ PO TBCR
40.0000 meq | EXTENDED_RELEASE_TABLET | Freq: Once | ORAL | Status: AC
  Administered 2019-03-27: 40 meq via ORAL
  Filled 2019-03-27: qty 2

## 2019-03-27 NOTE — Progress Notes (Signed)
PROGRESS NOTE    [name] [name]  SJG:[id number] DOB: July 18, 1930 DOA: 03/18/2019 PCP: [name], MD   Brief Narrative: 85 year old with past medical history significant for C. difficile x2 treated with 2 weeks of vancomycin each time, presented with 2 weeks of diarrhea, left-sided abdominal pain, poor appetite, weakness.  CT abdomen and pelvis showed pancolitis.  Continue to treat for C. difficile pancolitis.    Assessment & Plan:   Principal Problem:   C. difficile colitis Active Problems:   Hypokalemia   Acute kidney injury ([place])   Pancolitis (HCC)   Moderate malnutrition ([place])    1-C.  Difficile colitis with pancolitis Recurrent episode. Pneumobilia and biliary dilation seen on CT consistent with prior ERCP, and sphincterotomy.  Of note patient is a s/p ERCP 02/2019. Continue with oral vancomycin, Dificid added on 8/12 Management  per Dr. Carlean Purl Stool getting thicker. Plan to resume flexy seal and monitor.  Vancomycin increase dose 250 four times a day.  Rectal tube was lace again yesterday.  Started on Colestid 1 g BID empiric triad for 3--5 days.   Pulmonary edema; Acute Respiratory distress.  Presume Acute on chronic diastolic HF. Patient was SOB last night, wheezing. Chest x ray pulmonary edema, pleural effusion.  Received IV lasix.  Improved this am. Continue with IV lasix.  Plan to repeat chest x ray 8-17  AKI: Resolved with IV fluids Will hold hydrochlorothiazide for now. Stop fluids.   Hypokalemia, hypomagnesemia Replete k orally and IV magnesium   Hypertension: Continue with irbesartan Hold hydrochlorothiazide for now   Estimated body mass index is 20.67 kg/m as calculated from the following:   Height as of this encounter: 6' 2"  (1.88 m).   Weight as of this encounter: 73 kg.   DVT prophylaxis: Lovenox Code Status: Full code Family Communication: son updated at bedside.  Disposition Plan: Remain in the hospital for treatment of C. difficile  colitis Consultants:   GI  Procedures:   None  Antimicrobials:  Oral vancomycin  Subjective: Rectal tube was inserted again, stool mushi    Objective: Vitals:   03/26/19 1433 03/26/19 2219 03/26/19 2354 03/27/19 0558  BP: (!) 146/89 (!) 147/98  128/90  Pulse: 82 (!) 43 64 74  Resp: 18 18  18   Temp: 98.9 F (37.2 C) 99.1 F (37.3 C)  98.7 F (37.1 C)  TempSrc: Oral Oral  Oral  SpO2: 97% 93%  95%  Weight:      Height:        Intake/Output Summary (Last 24 hours) at 03/27/2019 0734 Last data filed at 03/27/2019 6546 Gross per 24 hour  Intake -  Output 1200 ml  Net -1200 ml   Filed Weights   03/18/19 1016  Weight: 73 kg    Examination:  General exam: NAD Respiratory system: CTA Cardiovascular system: S 1, S 2 RRR Gastrointestinal system; BS present, soft, nt Central nervous system: non focal.  Extremities; symmetric power.  Skin: no rashes.    Data Reviewed: I have personally reviewed following labs and imaging studies  CBC: Recent Labs  Lab 03/21/19 1148 03/24/19 0535 03/25/19 0538 03/26/19 0552 03/27/19 0552  WBC 10.1 9.2 9.4 11.7* 11.0*  NEUTROABS 8.5*  --   --   --   --   HGB 10.9* 10.5* 10.8* 11.6* 10.8*  HCT 34.2* 33.9* 35.7* 36.8* 34.4*  MCV 91.7 92.9 94.4 91.8 93.5  PLT 278 299 339 376 503   Basic Metabolic Panel: Recent Labs  Lab 03/22/19 0445  03/23/19 0543 03/24/19 0535 03/25/19 0538 03/26/19 0552 03/27/19 0552  NA 136 139 139 136 138 140  K 3.0* 3.6 3.2* 3.7 3.1* 3.8  CL 104 109 108 105 105 107  CO2 23 24 25 23 26 27   GLUCOSE 112* 107* 105* 93 107* 97  BUN 14 14 11 10 10 11   CREATININE 0.88 0.87 0.81 0.79 0.77 0.85  CALCIUM 7.5* 7.6* 7.6* 7.4* 7.7* 7.8*  MG 1.6* 1.8 1.7 1.8  --  1.8   GFR: Estimated Creatinine Clearance: 64.4 mL/min (by C-G formula based on SCr of 0.85 mg/dL). Liver Function Tests: Recent Labs  Lab 03/26/19 0552  AST 27  ALT 25  ALKPHOS 58  BILITOT 0.5  PROT 5.2*  ALBUMIN 2.4*   No results  for input(s): LIPASE, AMYLASE in the last 168 hours. No results for input(s): AMMONIA in the last 168 hours. Coagulation Profile: No results for input(s): INR, PROTIME in the last 168 hours. Cardiac Enzymes: No results for input(s): CKTOTAL, CKMB, CKMBINDEX, TROPONINI in the last 168 hours. BNP (last 3 results) No results for input(s): PROBNP in the last 8760 hours. HbA1C: No results for input(s): HGBA1C in the last 72 hours. CBG: No results for input(s): GLUCAP in the last 168 hours. Lipid Profile: No results for input(s): CHOL, HDL, LDLCALC, TRIG, CHOLHDL, LDLDIRECT in the last 72 hours. Thyroid Function Tests: No results for input(s): TSH, T4TOTAL, FREET4, T3FREE, THYROIDAB in the last 72 hours. Anemia Panel: No results for input(s): VITAMINB12, FOLATE, FERRITIN, TIBC, IRON, RETICCTPCT in the last 72 hours. Sepsis Labs: No results for input(s): PROCALCITON, LATICACIDVEN in the last 168 hours.  Recent Results (from the past 240 hour(s))  C difficile quick scan w PCR reflex     Status: Abnormal   Collection Time: 03/18/19 11:03 AM   Specimen: STOOL  Result Value Ref Range Status   C Diff antigen POSITIVE (A) NEGATIVE Final   C Diff toxin POSITIVE (A) NEGATIVE Final   C Diff interpretation Toxin producing C. difficile detected.  Final    Comment: RESULT CALLED TO, READ BACK BY AND VERIFIED WITH: GRIFFIN, M. RN @1231  ON 8.7.2020 BY [facility] Performed at [facility], [place] [address] [place], [place] [address]   SARS CORONAVIRUS 2 Nasal Swab Aptima Multi Swab     Status: None   Collection Time: 03/18/19  1:54 PM   Specimen: Aptima Multi Swab; Nasal Swab  Result Value Ref Range Status   SARS Coronavirus 2 NEGATIVE NEGATIVE Final    Comment: (NOTE) SARS-CoV-2 target nucleic acids are NOT DETECTED. The SARS-CoV-2 RNA is generally detectable in upper and lower respiratory specimens during the acute phase of infection. Negative results do not preclude SARS-CoV-2  infection, do not rule out co-infections with other pathogens, and should not be used as the sole basis for treatment or other patient management decisions. Negative results must be combined with clinical observations, patient history, and epidemiological information. The expected result is Negative. Fact Sheet for Patients: SugarRoll.be Fact Sheet for Healthcare Providers: https://www.woods-mathews.com/ This test is not yet approved or cleared by the [place] FDA and  has been authorized for detection and/or diagnosis of SARS-CoV-2 by FDA under an Emergency Use Authorization (EUA). This EUA will remain  in effect (meaning this test can be used) for the duration of the COVID-19 declaration under Section 56 4(b)(1) of the Act, 21 U.S.C. section 360bbb-3(b)(1), unless the authorization is terminated or revoked sooner. Performed at [facility], [place]  [address] [place], [place] [address]          Radiology Studies: Dg Chest Port 1 View  Result Date: 03/25/2019 CLINICAL DATA:  Wheezing. EXAM: PORTABLE CHEST 1 VIEW COMPARISON:  Radiograph 03/21/2019 FINDINGS: Progressive hazy bibasilar opacities, right greater than left, likely increasing pleural effusions. Post median sternotomy with stable cardiomegaly. Progressive interstitial thickening with Kerley B-lines consistent with pulmonary edema. No pneumothorax. IMPRESSION: Pulmonary edema with progressive bilateral pleural effusions over the past 4 days consistent with fluid overload/CHF. Electronically Signed   By: Keith Rake M.D.   On: 03/25/2019 21:38        Scheduled Meds: . aspirin EC  81 mg Oral Q supper  . B-complex with vitamin C  1 tablet Oral Daily  . cholecalciferol  1,000 Units Oral Daily  . colestipol  1 g Oral BID  . dicyclomine  10 mg Oral TID AC & HS  . dutasteride  0.5 mg Oral QPM  . enoxaparin (LOVENOX) injection  40 mg Subcutaneous Q24H  . feeding  supplement  1 Container Oral TID BM  . fidaxomicin  200 mg Oral BID  . Gerhardt's butt cream   Topical QID  . irbesartan  300 mg Oral Daily  . mirabegron ER  50 mg Oral Q supper  . rosuvastatin  20 mg Oral Q supper  . saccharomyces boulardii  250 mg Oral BID  . tamsulosin  0.4 mg Oral QPM  . vancomycin  250 mg Oral QID   Followed by  . [START ON 04/02/2019] vancomycin  250 mg Oral BID   Followed by  . [START ON 04/09/2019] vancomycin  250 mg Oral Daily   Followed by  . [START ON 04/16/2019] vancomycin  250 mg Oral QODAY   Followed by  . [START ON 05/14/2019] vancomycin  250 mg Oral Q3 days   Continuous Infusions:    LOS: 9 days    Time spent: 35 minutes.     Elmarie Shiley, MD [facility] Pager [phone]  If 7PM-7AM, please contact night-coverage [email or web address] Password [facility] 03/27/2019, 7:34 AM

## 2019-03-28 ENCOUNTER — Inpatient Hospital Stay (HOSPITAL_COMMUNITY): Payer: Medicare Other

## 2019-03-28 LAB — BASIC METABOLIC PANEL
Anion gap: 6 (ref 5–15)
BUN: 13 mg/dL (ref 8–23)
CO2: 27 mmol/L (ref 22–32)
Calcium: 7.8 mg/dL — ABNORMAL LOW (ref 8.9–10.3)
Chloride: 106 mmol/L (ref 98–111)
Creatinine, Ser: 0.86 mg/dL (ref 0.61–1.24)
GFR calc Af Amer: >60 mL/min (ref >60)
GFR calc non Af Amer: >60 mL/min (ref >60)
Glucose, Bld: 99 mg/dL (ref 70–99)
Potassium: 4.2 mmol/L (ref 3.5–5.1)
Sodium: 139 mmol/L (ref 135–145)

## 2019-03-28 LAB — CBC
HCT: 36.7 % — ABNORMAL LOW (ref 39.0–52.0)
Hemoglobin: 11.3 g/dL — ABNORMAL LOW (ref 13.0–17.0)
MCH: 29.2 pg (ref 26.0–34.0)
MCHC: 30.8 g/dL (ref 30.0–36.0)
MCV: 94.8 fL (ref 80.0–100.0)
Platelets: 360 10*3/uL (ref 150–400)
RBC: 3.87 MIL/uL — ABNORMAL LOW (ref 4.22–5.81)
RDW: 14.9 % (ref 11.5–15.5)
WBC: 10.1 10*3/uL (ref 4.0–10.5)
nRBC: 0 % (ref 0.0–0.2)

## 2019-03-28 MED ORDER — FUROSEMIDE 10 MG/ML IJ SOLN
40.0000 mg | Freq: Two times a day (BID) | INTRAMUSCULAR | Status: DC
  Administered 2019-03-28 – 2019-03-29 (×3): 40 mg via INTRAVENOUS
  Filled 2019-03-28 (×3): qty 4

## 2019-03-28 MED ORDER — POTASSIUM CHLORIDE CRYS ER 20 MEQ PO TBCR
40.0000 meq | EXTENDED_RELEASE_TABLET | Freq: Once | ORAL | Status: AC
  Administered 2019-03-28: 40 meq via ORAL
  Filled 2019-03-28: qty 2

## 2019-03-28 NOTE — Progress Notes (Signed)
Physical Therapy Treatment Patient Details Name: [name] [name] MRN: [id number] DOB: [birth year] Today's Date: 03/28/2019    History of Present Illness 85 year old white male with relapsing C. difficile colitis with moderate diffuse colitis on imaging, admitted with leukocytosis, acute kidney injury and hypokalemia.    PT Comments    Very limited session. Pt very eager and wanting to get OOB and work to getting stronger but limited by the leaking of his tubes ( flexiseal and condom catheter, mainly the flexiseal).   Returned this evening knowing pt really wants to get moving, however nursing exiting after continuously cleaning him up after flexiseal leaks. Pt really wanted to try PT and agreed to bed exercises as nursing suggested to avoid more leaking.   Worked supine with manually resisted exercises, however with very little movement, bowels began to be active again and oozing around flexiseal even in supine occurred.  Gave pt some theraband to work UE independently in bed as well.   Promised pt we will keep checking on him and work towards out of bed activities.   Follow Up Recommendations        Equipment Recommendations       Recommendations for Other Services       Precautions / Restrictions Precautions Precautions: Fall;Other (comment) Precaution Comments: contact precautions- C Diff Restrictions Weight Bearing Restrictions: No    Exercises Other Exercises Other Exercises: worked in supine per pt and nusing request due to nursing just completed cleaning pt up again, and pt keeps oozing next to flexiseal.Supine B knee extension with manual resistance ( 10x BLEs) , heel slides 10x BLE, SLR 10x BLE, hip Abduction with manual resistance in hhoklying 10x, tried bridging however this caused diarrhea and more leaking around flexiseal. Pt wanted nursing to enter and help clean him up again. Dropped off orange TB for PT to do UE theraband exercises Idependently . He states he has  done this before.    General Comments        Pertinent Vitals/Pain Pain Assessment: No/denies pain        Time: 1340-1400 PT Time Calculation (min) (ACUTE ONLY): 20 min  Charges:  $Therapeutic Exercise: 8-22 mins                     Clide Dales, PT Acute Rehabilitation Services Pager: [phone] Office: [phone] 03/28/2019    Clide Dales 03/28/2019, 6:09 PM

## 2019-03-28 NOTE — Progress Notes (Signed)
PT Cancellation Note  Patient Details Name: [name] [name] MRN: [id number] DOB: 02/17/1930   Cancelled Treatment:    Reason Eval/Treat Not Completed: Other (comment)(pt stated it's not a good time for PT because his condom catheter has come off and his bed is saturated in urine, RN notified. Will check back later today.)   Philomena Doheny PT 03/28/2019  Acute Rehabilitation Services Pager [phone] Office [phone]

## 2019-03-28 NOTE — Progress Notes (Signed)
PROGRESS NOTE    [name] [name]  OEH:[id number] DOB: 1930/02/07 DOA: 03/18/2019 PCP: [name], MD   Brief Narrative: 85 year old with past medical history significant for C. difficile x2 treated with 2 weeks of vancomycin each time, presented with 2 weeks of diarrhea, left-sided abdominal pain, poor appetite, weakness.  CT abdomen and pelvis showed pancolitis.  Continue to treat for C. difficile pancolitis.    Assessment & Plan:   Principal Problem:   C. difficile colitis Active Problems:   Hypokalemia   Acute kidney injury ([place])   Pancolitis (HCC)   Moderate malnutrition ([place])    1-C.  Difficile colitis with pancolitis Recurrent episode. Pneumobilia and biliary dilation seen on CT consistent with prior ERCP, and sphincterotomy.  Of note patient is a s/p ERCP 02/2019. Continue with oral vancomycin, Dificid added on 8/12 Management  per Dr. Carlean Purl Stool getting thicker. Plan to resume flexy seal and monitor.  Vancomycin increase dose 250 four times a day.  Rectal tube was lace again 8/15  Started on Colestid 1 g BID empiric triad for 3--5 days.  Stool is still watery.  GI following.   Pulmonary edema; Acute Respiratory distress.  Presume Acute on chronic diastolic HF. Patient was SOB last night, wheezing. Chest x ray pulmonary edema, pleural effusion.  Received IV lasix.  Improved this am. Continue with IV lasix.  Repeated chest x ray showed persistent pulmonary vascular congestion, pleural effusion greater on the right. Atelectasis.  Incentive spirometry.  Change lasix to 40 mg IV BID.  Monitor renal function closely.   AKI: Resolved with IV fluids Will hold hydrochlorothiazide for now. Stop fluids.   Hypokalemia, hypomagnesemia Resolved.  Monitor intermittently   Hypertension: Continue with irbesartan Hold hydrochlorothiazide for now   Estimated body mass index is 20.67 kg/m as calculated from the following:   Height as of this encounter: 6' 2"  (1.88  m).   Weight as of this encounter: 73 kg.   DVT prophylaxis: Lovenox Code Status: Full code Family Communication: son updated at bedside.  Disposition Plan: Remain in the hospital for treatment of C. difficile colitis Consultants:   GI  Procedures:   None  Antimicrobials:  Oral vancomycin  Subjective: Stool is still watery.  Nurse will measure stool.  He denies dyspnea.   Objective: Vitals:   03/27/19 1313 03/27/19 2137 03/28/19 0546 03/28/19 1317  BP: 140/88 (!) 142/95 132/82 121/81  Pulse: 60 83 (!) 52 63  Resp: 19 18 19 17   Temp: 99.2 F (37.3 C) 99.7 F (37.6 C) (!) 97.5 F (36.4 C) 98.6 F (37 C)  TempSrc: Oral Oral Oral Oral  SpO2: 96% 96% 95% 98%  Weight:      Height:        Intake/Output Summary (Last 24 hours) at 03/28/2019 1330 Last data filed at 03/28/2019 0900 Gross per 24 hour  Intake 240 ml  Output -  Net 240 ml   Filed Weights   03/18/19 1016  Weight: 73 kg    Examination:  General exam: NAD Respiratory system: CTA Cardiovascular system: S 1., S 2 RRR Gastrointestinal system; BS present, soft, nt Central nervous system: Non focal.  Extremities; Symmetric power.  Skin: no rashes.    Data Reviewed: I have personally reviewed following labs and imaging studies  CBC: Recent Labs  Lab 03/24/19 0535 03/25/19 0538 03/26/19 0552 03/27/19 0552 03/28/19 0539  WBC 9.2 9.4 11.7* 11.0* 10.1  HGB 10.5* 10.8* 11.6* 10.8* 11.3*  HCT 33.9* 35.7* 36.8* 34.4* 36.7*  MCV 92.9 94.4 91.8 93.5 94.8  PLT 299 339 376 371 161   Basic Metabolic Panel: Recent Labs  Lab 03/22/19 0445 03/23/19 0543 03/24/19 0535 03/25/19 0538 03/26/19 0552 03/27/19 0552 03/28/19 0539  NA 136 139 139 136 138 140 139  K 3.0* 3.6 3.2* 3.7 3.1* 3.8 4.2  CL 104 109 108 105 105 107 106  CO2 23 24 25 23 26 27 27   GLUCOSE 112* 107* 105* 93 107* 97 99  BUN 14 14 11 10 10 11 13   CREATININE 0.88 0.87 0.81 0.79 0.77 0.85 0.86  CALCIUM 7.5* 7.6* 7.6* 7.4* 7.7* 7.8*  7.8*  MG 1.6* 1.8 1.7 1.8  --  1.8  --    GFR: Estimated Creatinine Clearance: 63.7 mL/min (by C-G formula based on SCr of 0.86 mg/dL). Liver Function Tests: Recent Labs  Lab 03/26/19 0552  AST 27  ALT 25  ALKPHOS 58  BILITOT 0.5  PROT 5.2*  ALBUMIN 2.4*   No results for input(s): LIPASE, AMYLASE in the last 168 hours. No results for input(s): AMMONIA in the last 168 hours. Coagulation Profile: No results for input(s): INR, PROTIME in the last 168 hours. Cardiac Enzymes: No results for input(s): CKTOTAL, CKMB, CKMBINDEX, TROPONINI in the last 168 hours. BNP (last 3 results) No results for input(s): PROBNP in the last 8760 hours. HbA1C: No results for input(s): HGBA1C in the last 72 hours. CBG: No results for input(s): GLUCAP in the last 168 hours. Lipid Profile: No results for input(s): CHOL, HDL, LDLCALC, TRIG, CHOLHDL, LDLDIRECT in the last 72 hours. Thyroid Function Tests: No results for input(s): TSH, T4TOTAL, FREET4, T3FREE, THYROIDAB in the last 72 hours. Anemia Panel: No results for input(s): VITAMINB12, FOLATE, FERRITIN, TIBC, IRON, RETICCTPCT in the last 72 hours. Sepsis Labs: No results for input(s): PROCALCITON, LATICACIDVEN in the last 168 hours.  Recent Results (from the past 240 hour(s))  SARS CORONAVIRUS 2 Nasal Swab Aptima Multi Swab     Status: None   Collection Time: 03/18/19  1:54 PM   Specimen: Aptima Multi Swab; Nasal Swab  Result Value Ref Range Status   SARS Coronavirus 2 NEGATIVE NEGATIVE Final    Comment: (NOTE) SARS-CoV-2 target nucleic acids are NOT DETECTED. The SARS-CoV-2 RNA is generally detectable in upper and lower respiratory specimens during the acute phase of infection. Negative results do not preclude SARS-CoV-2 infection, do not rule out co-infections with other pathogens, and should not be used as the sole basis for treatment or other patient management decisions. Negative results must be combined with clinical observations,  patient history, and epidemiological information. The expected result is Negative. Fact Sheet for Patients: SugarRoll.be Fact Sheet for Healthcare Providers: https://www.woods-mathews.com/ This test is not yet approved or cleared by the [place] FDA and  has been authorized for detection and/or diagnosis of SARS-CoV-2 by FDA under an Emergency Use Authorization (EUA). This EUA will remain  in effect (meaning this test can be used) for the duration of the COVID-19 declaration under Section 56 4(b)(1) of the Act, 21 U.S.C. section 360bbb-3(b)(1), unless the authorization is terminated or revoked sooner. Performed at [facility], [place] [address], [place], [place] [address]          Radiology Studies: Dg Chest 2 View  Result Date: 03/28/2019 CLINICAL DATA:  History of pleural effusions. Patient reports shortness of breath and weakness. EXAM: CHEST - 2 VIEW COMPARISON:  Chest radiographs 03/25/2019 FINDINGS: Status post median sternotomy. Cardiomegaly. Persistent pulmonary vascular congestion. Bilateral pleural  effusions with bibasilar opacities, not significantly changed from prior exam. No evidence of pneumothorax. No acute bony abnormality IMPRESSION: No significant interval change as compared to chest radiograph 03/25/2019. Cardiomegaly with persistent pulmonary vascular congestion. Persistent bilateral pleural effusions (greater on the right). Bibasilar opacities may reflect atelectasis and/or pneumonia. Electronically Signed   By: Kellie Simmering   On: 03/28/2019 09:19        Scheduled Meds: . aspirin EC  81 mg Oral Q supper  . B-complex with vitamin C  1 tablet Oral Daily  . cholecalciferol  1,000 Units Oral Daily  . colestipol  1 g Oral BID  . dicyclomine  10 mg Oral TID AC & HS  . dutasteride  0.5 mg Oral QPM  . enoxaparin (LOVENOX) injection  40 mg Subcutaneous Q24H  . feeding supplement  1 Container Oral TID BM  .  fidaxomicin  200 mg Oral BID  . furosemide  40 mg Intravenous Q12H  . Gerhardt's butt cream   Topical QID  . irbesartan  300 mg Oral Daily  . mirabegron ER  50 mg Oral Q supper  . rosuvastatin  20 mg Oral Q supper  . saccharomyces boulardii  250 mg Oral BID  . tamsulosin  0.4 mg Oral QPM  . vancomycin  250 mg Oral QID   Followed by  . [START ON 04/02/2019] vancomycin  250 mg Oral BID   Followed by  . [START ON 04/09/2019] vancomycin  250 mg Oral Daily   Followed by  . [START ON 04/16/2019] vancomycin  250 mg Oral QODAY   Followed by  . [START ON 05/14/2019] vancomycin  250 mg Oral Q3 days   Continuous Infusions:    LOS: 10 days    Time spent: 35 minutes.     Elmarie Shiley, MD [facility] Pager [phone]  If 7PM-7AM, please contact night-coverage [email or web address] Password [email or web address] 03/28/2019, 1:30 PM

## 2019-03-28 NOTE — TOC Initial Note (Signed)
Transition of Care [facility]) - Initial/Assessment Note    Patient Details  Name: [name] [name] MRN: [id number] Date of Birth: 02-04-1930  [facility] CM/SW Contact:    Nila Nephew, LCSW Phone Number: [phone] 03/28/2019, 11:43 AM  Clinical Narrative:   Pt admitted with Cdiff colitis, from [facility] independent living where he resides with his wife. Anticipating admitting to rehab/SNF section of [facility] at [place], [place] coordinating.                 Expected Discharge Plan: Skilled Nursing Facility Barriers to Discharge: Continued Medical Work up   Patient Goals and CMS Choice Patient states their goals for this hospitalization and ongoing recovery are:: back to activity CMS Medicare.gov Compare Post Acute Care list provided to:: (NA) Choice offered to / list presented to : NA  Expected Discharge Plan and Services Expected Discharge Plan: [place] In-house Referral: Clinical Social Work   Post Acute Care Choice: [place] Living arrangements for the past 2 months: [place]                                      Prior Living Arrangements/Services Living arrangements for the past 2 months: [place] Lives with:: Spouse, Facility Resident Patient language and need for interpreter reviewed:: No Do you feel safe going back to the place where you live?: Yes      Need for Family Participation in Patient Care: Yes (Comment)(wife and son involved) Care giver support system in place?: Yes (comment)([facility] resident)   Criminal Activity/Legal Involvement Pertinent to Current Situation/Hospitalization: No - Comment as needed  Activities of Daily Living Home Assistive Devices/Equipment: None ADL Screening (condition at time of admission) Patient's cognitive ability adequate to safely complete daily activities?: Yes Is the patient deaf or have difficulty hearing?: No Does the patient have  difficulty seeing, even when wearing glasses/contacts?: No Does the patient have difficulty concentrating, remembering, or making decisions?: No Patient able to express need for assistance with ADLs?: Yes Does the patient have difficulty dressing or bathing?: Yes Independently performs ADLs?: No Communication: Independent Dressing (OT): Needs assistance Is this a change from baseline?: Change from baseline, expected to last <3days Grooming: Needs assistance Is this a change from baseline?: Change from baseline, expected to last <3 days Feeding: Needs assistance Is this a change from baseline?: Change from baseline, expected to last <3 days Bathing: Needs assistance Is this a change from baseline?: Change from baseline, expected to last <3 days Toileting: Needs assistance Is this a change from baseline?: Change from baseline, expected to last <3 days In/Out Bed: Needs assistance Is this a change from baseline?: Change from baseline, expected to last <3 days Walks in Home: Independent with device (comment) Does the patient have difficulty walking or climbing stairs?: Yes Weakness of Legs: Both Weakness of Arms/Hands: Both  Permission Sought/Granted Permission sought to share information with : Facility Sport and exercise psychologist, Family Supports    Share Information with NAME: [phone] son Belenda Cruise, [phone] wife Jana Half  Permission granted to share info w AGENCY: [facility]        Emotional Assessment              Admission diagnosis:  Hypokalemia [E87.6] C. difficile colitis [A04.72] Patient Active Problem List   Diagnosis Date Noted  . Moderate malnutrition ([place])   . Acute kidney injury ([place])   . Pancolitis ([place])   .  Cholangitis due to bile duct calculus with obstruction 03/03/2019  . Hypokalemia 03/03/2019  . Indigestion 01/31/2019  . C. difficile colitis 01/18/2019  . IBS (irritable bowel syndrome) 01/18/2019  . Pain in right hip 01/01/2017  . Peripheral  neuropathy 10/13/2014  . Spinal stenosis of lumbar region 10/13/2014  . Wandering pacemaker 11/22/2013  . PAC (premature atrial contraction) 11/17/2012  . Arthritis 09/30/2011  . Left obturator hernia 05/21/2011  . Right femoral hernia 05/21/2011  . Left inguinal hernia 04/10/2011  . DOE (dyspnea on exertion) 11/06/2010  . PVC's (premature ventricular contractions) 11/06/2010  . S/P mitral valve repair 11/06/2010  . Fatigue 11/06/2010  . CAD (coronary artery disease)   . HTN (hypertension)   . Hyperlipidemia    PCP:  [name], MD Pharmacy:   [facility] [address] [place] [address] [place] [address] Phone: [phone] Fax: [place], [place] [address] Phone: [phone] Fax: [phone]     Social Determinants of Health (SDOH) Interventions    Readmission Risk Interventions No flowsheet data found.

## 2019-03-28 NOTE — Progress Notes (Signed)
     Progress Note    ASSESSMENT AND PLAN:   1, 85 yo male with recurrent C-diff infection, leukocytosis, AKI (resolved).  Pancolitis (with ? Superimposed diverticulitis on CTAP). Getting long PO Vancomycin taper over 8 - 12 weeks,  Florastor and Bentyl ( rectal spasm). Added Dificid on 03/22/19. Increased PO Vancomycine to 250 mg QID on 03/25/19. Colestid added Saturday. Newer agent for C-diff , Zinplava,  not on formulary. Fecal transplant not possible due to [place] pandemic.  -AKI , hypokalemia and leukocytosis all resolved.  -stool in flexiseal tube is thick. I+O from yesterday states stool output x 3  ( ? Meaning). I discussed with RN taking care of patient today and she is trying to figure out how exactly how much output he is having. There is no stool in bag at present, just in tubing.  - Continue current regimen for now. Will ask Nursing to document exact amount of stool output in bag per shift. Can increase Colestid if needed -Will ask PT to ambulate patient. -Will call patient's son with condition update per Hospitalist request.     SUBJECTIVE    Just back from CXR to follow up on pleural effusion.   Patient feels like he is near panic level lying in bed for several days. Rectal tube is uncomfortable but not specific pain.    OBJECTIVE:     Vital signs in last 24 hours: Temp:  [97.5 F (36.4 C)-99.7 F (37.6 C)] 97.5 F (36.4 C) (08/17 0546) Pulse Rate:  [52-83] 52 (08/17 0546) Resp:  [18-19] 19 (08/17 0546) BP: (132-142)/(82-95) 132/82 (08/17 0546) SpO2:  [95 %-96 %] 95 % (08/17 0546) Last BM Date: 03/27/19 General:   Alert, well-developed male in NAD EENT:  Normal hearing, non icteric sclera, conjunctive pink.  Heart:  Regular rate, irreg rhythm;  Trace BLE extremity edema   Pulm: Normal respiratory effort Abdomen:  Soft, nondistended, nontender.  Normal bowel sounds.  Neurologic:  Alert and  oriented x4;  grossly normal neurologically. Psych:  Pleasant,  cooperative.  Normal mood and affect.   Intake/Output from previous day: 08/16 0701 - 08/17 0700 In: 540 [P.O.:540] Out: 200 [Urine:200] Intake/Output this shift: No intake/output data recorded.  Lab Results: Recent Labs    03/26/19 0552 03/27/19 0552 03/28/19 0539  WBC 11.7* 11.0* 10.1  HGB 11.6* 10.8* 11.3*  HCT 36.8* 34.4* 36.7*  PLT 376 371 360   BMET Recent Labs    03/26/19 0552 03/27/19 0552 03/28/19 0539  NA 138 140 139  K 3.1* 3.8 4.2  CL 105 107 106  CO2 26 27 27   GLUCOSE 107* 97 99  BUN 10 11 13   CREATININE 0.77 0.85 0.86  CALCIUM 7.7* 7.8* 7.8*   LFT Recent Labs    03/26/19 0552  PROT 5.2*  ALBUMIN 2.4*  AST 27  ALT 25  ALKPHOS 12  BILITOT 0.5  BILIDIR 0.2  IBILI 0.3       Principal Problem:   C. difficile colitis Active Problems:   Hypokalemia   Acute kidney injury ([place])   Pancolitis (HCC)   Moderate malnutrition ([place])     LOS: 10 days   Tye Savoy ,NP 03/28/2019, 8:55 AM

## 2019-03-28 NOTE — Care Management Important Message (Signed)
Important Message  Patient Details IM Letter given to [facility] SW to present to the Patient Name: [name] [name] MRN: [id number] Date of Birth: [birth year]   Medicare Important Message Given:  Yes     Kerin Salen 03/28/2019, 11:21 AM

## 2019-03-29 LAB — CBC
HCT: 36.4 % — ABNORMAL LOW (ref 39.0–52.0)
Hemoglobin: 11.6 g/dL — ABNORMAL LOW (ref 13.0–17.0)
MCH: 29.4 pg (ref 26.0–34.0)
MCHC: 31.9 g/dL (ref 30.0–36.0)
MCV: 92.4 fL (ref 80.0–100.0)
Platelets: 367 10*3/uL (ref 150–400)
RBC: 3.94 MIL/uL — ABNORMAL LOW (ref 4.22–5.81)
RDW: 15 % (ref 11.5–15.5)
WBC: 8.5 10*3/uL (ref 4.0–10.5)
nRBC: 0 % (ref 0.0–0.2)

## 2019-03-29 LAB — BASIC METABOLIC PANEL
Anion gap: 9 (ref 5–15)
BUN: 13 mg/dL (ref 8–23)
CO2: 28 mmol/L (ref 22–32)
Calcium: 7.7 mg/dL — ABNORMAL LOW (ref 8.9–10.3)
Chloride: 100 mmol/L (ref 98–111)
Creatinine, Ser: 0.83 mg/dL (ref 0.61–1.24)
GFR calc Af Amer: >60 mL/min (ref >60)
GFR calc non Af Amer: >60 mL/min (ref >60)
Glucose, Bld: 102 mg/dL — ABNORMAL HIGH (ref 70–99)
Potassium: 3.4 mmol/L — ABNORMAL LOW (ref 3.5–5.1)
Sodium: 137 mmol/L (ref 135–145)

## 2019-03-29 MED ORDER — TRAZODONE HCL 50 MG PO TABS
25.0000 mg | ORAL_TABLET | Freq: Every evening | ORAL | Status: DC | PRN
  Administered 2019-03-29 – 2019-03-30 (×2): 25 mg via ORAL
  Filled 2019-03-29 (×2): qty 1

## 2019-03-29 MED ORDER — CIPROFLOXACIN IN D5W 400 MG/200ML IV SOLN
400.0000 mg | Freq: Once | INTRAVENOUS | Status: AC
  Administered 2019-03-29: 400 mg via INTRAVENOUS
  Filled 2019-03-29: qty 200

## 2019-03-29 MED ORDER — POTASSIUM CHLORIDE CRYS ER 20 MEQ PO TBCR
40.0000 meq | EXTENDED_RELEASE_TABLET | Freq: Two times a day (BID) | ORAL | Status: AC
  Administered 2019-03-29 (×2): 40 meq via ORAL
  Filled 2019-03-29 (×2): qty 2

## 2019-03-29 MED ORDER — SODIUM CHLORIDE 0.9 % IV SOLN: INTRAVENOUS | Status: DC

## 2019-03-29 MED ORDER — LOPERAMIDE HCL 2 MG PO CAPS
2.0000 mg | ORAL_CAPSULE | Freq: Two times a day (BID) | ORAL | Status: DC
  Administered 2019-03-29 (×2): 2 mg via ORAL
  Filled 2019-03-29 (×3): qty 1

## 2019-03-29 MED ORDER — SODIUM CHLORIDE 0.9 % IV SOLN
INTRAVENOUS | Status: DC
  Administered 2019-03-29: 21:00:00 via INTRAVENOUS

## 2019-03-29 MED ORDER — FUROSEMIDE 10 MG/ML IJ SOLN
40.0000 mg | Freq: Every day | INTRAMUSCULAR | Status: DC
  Administered 2019-03-30: 40 mg via INTRAVENOUS
  Filled 2019-03-29: qty 4

## 2019-03-29 MED ORDER — POTASSIUM CHLORIDE CRYS ER 20 MEQ PO TBCR
40.0000 meq | EXTENDED_RELEASE_TABLET | Freq: Once | ORAL | Status: AC
  Administered 2019-03-29: 40 meq via ORAL
  Filled 2019-03-29: qty 2

## 2019-03-29 NOTE — Progress Notes (Signed)
Physical Therapy Treatment Patient Details Name: [name] [name] MRN: [id number] DOB: 1930-05-14 Today's Date: 03/29/2019    History of Present Illness 85 year old white male with relapsing C. difficile colitis with moderate diffuse colitis on imaging, admitted with leukocytosis, acute kidney injury and hypokalemia.    PT Comments    Pt with improved tolerance for activity today and very motivated to participate in OOB mobility. Pt with limited PT session yesterday due to leaking from flexiseal, pt with min leaking today due to little output. Pt ambulated 35 ft with RW with min assist for steadying, pt requiring mod verbal cuing for safety with RW use. RN requested pt back to bed to remove pt's flexiseal, RN encouraged to move pt to recliner post-removal for increased OOB time. PT continuing to recommend SNF level of care post-acutely to address pt deconditioning and mobility deficits. Will continue to follow acutely.   Follow Up Recommendations  SNF     Equipment Recommendations  None recommended by PT    Recommendations for Other Services       Precautions / Restrictions Precautions Precautions: Fall;Other (comment) Precaution Comments: contact precautions- C Diff Restrictions Weight Bearing Restrictions: No    Mobility  Bed Mobility Overal bed mobility: Needs Assistance Bed Mobility: Sidelying to Sit;Rolling;Sit to Supine Rolling: Min assist Sidelying to sit: Min assist;HOB elevated   Sit to supine: Min assist;HOB elevated   General bed mobility comments: Min assist for rolling for completion of roll to L side with verbal cuing for hand placement on bedrail, min assist for supine<>sit for LE management, scooting assist, and trunk assist.  Transfers Overall transfer level: Needs assistance Equipment used: Rolling walker (2 wheeled) Transfers: Sit to/from Stand Sit to Stand: Min assist;Mod assist;From elevated surface         General transfer comment: Min assist for  power up and steadying from bed and elevated surface, requires mod assist for stand from recliner due to low surface. Verbal cuing for hand placement upon rising on both stand attempts  Ambulation/Gait Ambulation/Gait assistance: Min assist;+2 safety/equipment Gait Distance (Feet): 35 Feet Assistive device: Rolling walker (2 wheeled) Gait Pattern/deviations: Step-through pattern;Decreased stride length;Trunk flexed Gait velocity: decr   General Gait Details: Min assist for steadying, directing pt; verbal cuing for placement in RW, sequencing, upright posture. Pt limited in ambulation distance due to fatigue.   Stairs             Wheelchair Mobility    Modified Rankin (Stroke Patients Only)       Balance Overall balance assessment: Needs assistance Sitting-balance support: Feet supported Sitting balance-Leahy Scale: Good     Standing balance support: Bilateral upper extremity supported Standing balance-Leahy Scale: Poor Standing balance comment: reliant on UEs for balance and assist from PT                            Cognition Arousal/Alertness: Awake/alert Behavior During Therapy: WFL for tasks assessed/performed Overall Cognitive Status: Within Functional Limits for tasks assessed                                        Exercises General Exercises - Lower Extremity Hip Flexion/Marching: AROM;5 reps;Both;Seated    General Comments        Pertinent Vitals/Pain Pain Assessment: 0-10 Pain Score: 3  Pain Location: urethra/penis Pain Descriptors / Indicators: Discomfort Pain Intervention(s): Limited  activity within patient's tolerance    Home Living                      Prior Function            PT Goals (current goals can now be found in the care plan section) Acute Rehab PT Goals Patient Stated Goal: get better PT Goal Formulation: With patient Time For Goal Achievement: 04/08/19 Potential to Achieve Goals:  Good Progress towards PT goals: Progressing toward goals    Frequency    Min 2X/week      PT Plan Current plan remains appropriate    Co-evaluation              AM-PAC PT "6 Clicks" Mobility   Outcome Measure  Help needed turning from your back to your side while in a flat bed without using bedrails?: A Little Help needed moving from lying on your back to sitting on the side of a flat bed without using bedrails?: A Little Help needed moving to and from a bed to a chair (including a wheelchair)?: A Little Help needed standing up from a chair using your arms (e.g., wheelchair or bedside chair)?: A Little Help needed to walk in hospital room?: A Little Help needed climbing 3-5 steps with a railing? : A Lot 6 Click Score: 17    End of Session Equipment Utilized During Treatment: Gait belt Activity Tolerance: Patient limited by fatigue Patient left: in bed;with nursing/sitter in room;with call bell/phone within reach Nurse Communication: Mobility status PT Visit Diagnosis: Difficulty in walking, not elsewhere classified (R26.2);Muscle weakness (generalized) (M62.81)     Time: 7035-0093 PT Time Calculation (min) (ACUTE ONLY): 17 min  Charges:  $Gait Training: 8-22 mins                     Julien Girt, PT Acute Rehabilitation Services Pager [phone]  Office [phone]    Roxine Caddy D Elonda Husky 03/29/2019, 12:34 PM

## 2019-03-29 NOTE — Progress Notes (Signed)
  Patient's HR 54 this morning, will inform on call.   Today's Vitals   03/28/19 1800 03/28/19 2058 03/28/19 2145 03/29/19 0605  BP:  (!) 163/85  (!) 143/80  Pulse:  74  (!) 54  Resp:  17  17  Temp:  98.7 F (37.1 C)  99.5 F (37.5 C)  TempSrc:  Oral  Oral  SpO2:  95%  94%  Weight:    81.3 kg  Height:      PainSc: 4   0-No pain

## 2019-03-29 NOTE — Progress Notes (Addendum)
PROGRESS NOTE    [name] [name]  EGB:[id number] DOB: 07-25-1930 DOA: 03/18/2019 PCP: [name], MD   Brief Narrative: 85 year old with past medical history significant for C. difficile x2 treated with 2 weeks of vancomycin each time, presented with 2 weeks of diarrhea, left-sided abdominal pain, poor appetite, weakness.  CT abdomen and pelvis showed pancolitis.  Continue to treat for C. difficile pancolitis.   Patient was admitted with recurrent C. difficile colitis.  He was a started on vancomycin and Dificid.  Subsequently Colestid was added.  GI has been following and helping with patient's care. Patient develop worsening shortness of breath, chest x-ray showed pulmonary edema.  He was a started on IV Lasix.  Repeat a chest x-ray 8/17 showed persistent pulmonary edema pleural effusion Lasix changed to 40 mg twice daily for 24 hours.   Assessment & Plan:   Principal Problem:   C. difficile colitis Active Problems:   Hypokalemia   Acute kidney injury ([place])   Pancolitis (HCC)   Moderate malnutrition ([place])    1-C.  Difficile colitis with pancolitis: Recurrent episode. Pneumobilia and biliary dilation seen on CT consistent with prior ERCP, and sphincterotomy.  Of note patient is a s/p ERCP 02/2019. Continue with oral vancomycin, Dificid added on 8/12 Management  per GI Vancomycin increase dose 250 four times a day.  Started on Colestid 1 g BID empiric triad for 3--5 days.  Flexiseal  removed today, patient was passing stool around around it. Plan to start Imodium today.  Pulmonary edema; Acute Respiratory distress.  Presume Acute on chronic diastolic HF. Patient was SOB last night, wheezing. Chest x ray pulmonary edema, pleural effusion.  Received IV lasix.  Improved this am. Continue with IV lasix.  Repeated chest x ray showed persistent pulmonary vascular congestion, pleural effusion greater on the right. Atelectasis.  Incentive spirometry.  Received 40 mg of IV Lasix  twice daily for 24 hours.  Will transition to 40 mg IV daily. 3.1 L urine out put.   AKI: Resolved with IV fluids Will hold hydrochlorothiazide for now. Stop fluids.   Hypokalemia, hypomagnesemia KCl 40 mEq x 3  Hypertension: Continue with irbesartan Hold hydrochlorothiazide for now  Bradycardia; HR 54 this am; replete K. Asymptomatic,  Reduce trazodone.   Estimated body mass index is 23.01 kg/m as calculated from the following:   Height as of this encounter: 6' 2"  (1.88 m).   Weight as of this encounter: 81.3 kg.   DVT prophylaxis: Lovenox Code Status: Full code. Family Communication: son updated over phone 8-17 Disposition Plan: Remain in the hospital for treatment of C. difficile colitis Consultants:   GI  Procedures:   None  Antimicrobials:  Oral vancomycin  Subjective: He denies abdominal pain.  He was able to work in the bed with PT yesterday.  He is looking forward to work with PT today.  He continued to complain about condom cath that comes off.  He denies shortness of breath  Objective: Vitals:   03/28/19 2058 03/29/19 0605 03/29/19 1338 03/29/19 1406  BP: (!) 163/85 (!) 143/80 (!) 128/104 140/90  Pulse: 74 (!) 54  63  Resp: 17 17 18    Temp: 98.7 F (37.1 C) 99.5 F (37.5 C)    TempSrc: Oral Oral    SpO2: 95% 94% 94% 98%  Weight:  81.3 kg    Height:        Intake/Output Summary (Last 24 hours) at 03/29/2019 1418 Last data filed at 03/29/2019 1056 Gross per 24  hour  Intake 240 ml  Output 2590 ml  Net -2350 ml   Filed Weights   03/18/19 1016 03/29/19 0605  Weight: 73 kg 81.3 kg    Examination:  General exam: NAD Respiratory system: CTA Cardiovascular system: S 1, S 2 RRR Gastrointestinal system; BS present, soft, nt Central nervous system: Non focal.  Extremities; Symmetric power.  Skin: no rashes.    Data Reviewed: I have personally reviewed following labs and imaging studies  CBC: Recent Labs  Lab 03/25/19 0538 03/26/19 0552  03/27/19 0552 03/28/19 0539 03/29/19 0532  WBC 9.4 11.7* 11.0* 10.1 8.5  HGB 10.8* 11.6* 10.8* 11.3* 11.6*  HCT 35.7* 36.8* 34.4* 36.7* 36.4*  MCV 94.4 91.8 93.5 94.8 92.4  PLT 339 376 371 360 277   Basic Metabolic Panel: Recent Labs  Lab 03/23/19 0543 03/24/19 0535 03/25/19 0538 03/26/19 0552 03/27/19 0552 03/28/19 0539 03/29/19 0532  NA 139 139 136 138 140 139 137  K 3.6 3.2* 3.7 3.1* 3.8 4.2 3.4*  CL 109 108 105 105 107 106 100  CO2 24 25 23 26 27 27 28   GLUCOSE 107* 105* 93 107* 97 99 102*  BUN 14 11 10 10 11 13 13   CREATININE 0.87 0.81 0.79 0.77 0.85 0.86 0.83  CALCIUM 7.6* 7.6* 7.4* 7.7* 7.8* 7.8* 7.7*  MG 1.8 1.7 1.8  --  1.8  --   --    GFR: Estimated Creatinine Clearance: 73.5 mL/min (by C-G formula based on SCr of 0.83 mg/dL). Liver Function Tests: Recent Labs  Lab 03/26/19 0552  AST 27  ALT 25  ALKPHOS 58  BILITOT 0.5  PROT 5.2*  ALBUMIN 2.4*   No results for input(s): LIPASE, AMYLASE in the last 168 hours. No results for input(s): AMMONIA in the last 168 hours. Coagulation Profile: No results for input(s): INR, PROTIME in the last 168 hours. Cardiac Enzymes: No results for input(s): CKTOTAL, CKMB, CKMBINDEX, TROPONINI in the last 168 hours. BNP (last 3 results) No results for input(s): PROBNP in the last 8760 hours. HbA1C: No results for input(s): HGBA1C in the last 72 hours. CBG: No results for input(s): GLUCAP in the last 168 hours. Lipid Profile: No results for input(s): CHOL, HDL, LDLCALC, TRIG, CHOLHDL, LDLDIRECT in the last 72 hours. Thyroid Function Tests: No results for input(s): TSH, T4TOTAL, FREET4, T3FREE, THYROIDAB in the last 72 hours. Anemia Panel: No results for input(s): VITAMINB12, FOLATE, FERRITIN, TIBC, IRON, RETICCTPCT in the last 72 hours. Sepsis Labs: No results for input(s): PROCALCITON, LATICACIDVEN in the last 168 hours.  No results found for this or any previous visit (from the past 240 hour(s)).        Radiology Studies: Dg Chest 2 View  Result Date: 03/28/2019 CLINICAL DATA:  History of pleural effusions. Patient reports shortness of breath and weakness. EXAM: CHEST - 2 VIEW COMPARISON:  Chest radiographs 03/25/2019 FINDINGS: Status post median sternotomy. Cardiomegaly. Persistent pulmonary vascular congestion. Bilateral pleural effusions with bibasilar opacities, not significantly changed from prior exam. No evidence of pneumothorax. No acute bony abnormality IMPRESSION: No significant interval change as compared to chest radiograph 03/25/2019. Cardiomegaly with persistent pulmonary vascular congestion. Persistent bilateral pleural effusions (greater on the right). Bibasilar opacities may reflect atelectasis and/or pneumonia. Electronically Signed   By: Kellie Simmering   On: 03/28/2019 09:19        Scheduled Meds: . aspirin EC  81 mg Oral Q supper  . B-complex with vitamin C  1 tablet Oral Daily  . cholecalciferol  1,000 Units Oral Daily  . colestipol  1 g Oral BID  . dicyclomine  10 mg Oral TID AC & HS  . dutasteride  0.5 mg Oral QPM  . enoxaparin (LOVENOX) injection  40 mg Subcutaneous Q24H  . feeding supplement  1 Container Oral TID BM  . fidaxomicin  200 mg Oral BID  . [START ON 03/30/2019] furosemide  40 mg Intravenous Daily  . Gerhardt's butt cream   Topical QID  . irbesartan  300 mg Oral Daily  . loperamide  2 mg Oral BID  . mirabegron ER  50 mg Oral Q supper  . potassium chloride  40 mEq Oral BID  . potassium chloride  40 mEq Oral Once  . rosuvastatin  20 mg Oral Q supper  . saccharomyces boulardii  250 mg Oral BID  . tamsulosin  0.4 mg Oral QPM  . vancomycin  250 mg Oral QID   Followed by  . [START ON 04/08/2019] vancomycin  250 mg Oral BID   Followed by  . [START ON 04/15/2019] vancomycin  250 mg Oral Daily   Followed by  . [START ON 04/22/2019] vancomycin  250 mg Oral QODAY   Followed by  . [START ON 05/20/2019] vancomycin  250 mg Oral Q3 days   Continuous Infusions:     LOS: 11 days    Time spent: 35 minutes.     Elmarie Shiley, MD [facility] Pager [phone]  If 7PM-7AM, please contact night-coverage [email or web address] Password [facility] 03/29/2019, 2:18 PM

## 2019-03-29 NOTE — NC FL2 (Addendum)
[place] MEDICAID FL2 LEVEL OF CARE SCREENING TOOL     IDENTIFICATION  Patient Name: [name] [name] Birthdate: 03/21/1930 Sex: male Admission Date (Current Location): 03/18/2019  [facility] and [place] Number:  Herbalist and Address:  [facility],  [address] [place], [place]      Provider Number: [id number]  Attending Physician Name and Address:  Elmarie Shiley, MD  Relative Name and Phone Number:  [phone] Wife Jana Half    Current Level of Care: Hospital Recommended Level of Care: [place] Prior Approval Number:    Date Approved/Denied:   PASRR Number:    Discharge Plan: SNF    Current Diagnoses: Patient Active Problem List   Diagnosis Date Noted  . Moderate malnutrition ([place])   . Acute kidney injury ([place])   . Pancolitis ([place])   . Cholangitis due to bile duct calculus with obstruction 03/03/2019  . Hypokalemia 03/03/2019  . Indigestion 01/31/2019  . C. difficile colitis 01/18/2019  . IBS (irritable bowel syndrome) 01/18/2019  . Pain in right hip 01/01/2017  . Peripheral neuropathy 10/13/2014  . Spinal stenosis of lumbar region 10/13/2014  . Wandering pacemaker 11/22/2013  . PAC (premature atrial contraction) 11/17/2012  . Arthritis 09/30/2011  . Left obturator hernia 05/21/2011  . Right femoral hernia 05/21/2011  . Left inguinal hernia 04/10/2011  . DOE (dyspnea on exertion) 11/06/2010  . PVC's (premature ventricular contractions) 11/06/2010  . S/P mitral valve repair 11/06/2010  . Fatigue 11/06/2010  . CAD (coronary artery disease)   . HTN (hypertension)   . Hyperlipidemia     Orientation RESPIRATION BLADDER Height & Weight     Self, Time, Situation, Place  Normal Continent Weight: 179 lb 3.7 oz (81.3 kg) Height:  6' 2"  (188 cm)  BEHAVIORAL SYMPTOMS/MOOD NEUROLOGICAL BOWEL NUTRITION STATUS      Incontinent(rectal tube) Diet(soft diet)  AMBULATORY STATUS COMMUNICATION OF NEEDS Skin   Extensive  Assist Verbally Normal                       Personal Care Assistance Level of Assistance  Bathing, Dressing, Feeding Bathing Assistance: Maximum assistance Feeding assistance: Independent Dressing Assistance: Maximum assistance     Functional Limitations Info  Sight, Hearing, Speech Sight Info: Adequate Hearing Info: Adequate Speech Info: Adequate    SPECIAL CARE FACTORS FREQUENCY  PT (By licensed PT), OT (By licensed OT)     PT Frequency: 5x OT Frequency: 5x            Contractures Contractures Info: Not present    Additional Factors Info  Code Status, Allergies Code Status Info: full code Allergies Info: nka           Current Medications (03/29/2019):  This is the current hospital active medication list Current Facility-Administered Medications  Medication Dose Route Frequency Provider Last Rate Last Dose  . aspirin EC tablet 81 mg  81 mg Oral Q supper Florencia Reasons, MD   81 mg at 03/28/19 1718  . B-complex with vitamin C tablet 1 tablet  1 tablet Oral Daily Florencia Reasons, MD   1 tablet at 03/29/19 0947  . cholecalciferol (VITAMIN D) tablet 1,000 Units  1,000 Units Oral Daily Florencia Reasons, MD   1,000 Units at 03/29/19 0946  . colestipol (COLESTID) tablet 1 g  1 g Oral BID Mauri Pole, MD   1 g at 03/29/19 0946  . dicyclomine (BENTYL) capsule 10 mg  10 mg Oral TID AC &  HS Zehr, Laban Emperor, PA-C   10 mg at 03/29/19 3474  . dutasteride (AVODART) capsule 0.5 mg  0.5 mg Oral QPM Florencia Reasons, MD   0.5 mg at 03/28/19 1718  . enoxaparin (LOVENOX) injection 40 mg  40 mg Subcutaneous Q24H Samuella Cota, MD   40 mg at 03/28/19 1526  . feeding supplement (BOOST / RESOURCE BREEZE) liquid 1 Container  1 Container Oral TID BM Regalado, Belkys A, MD   1 Container at 03/29/19 0947  . fidaxomicin (DIFICID) tablet 200 mg  200 mg Oral BID Alonza Bogus D, PA-C   200 mg at 03/29/19 0946  . furosemide (LASIX) injection 40 mg  40 mg Intravenous Q12H Regalado, Belkys A, MD   40 mg at  03/29/19 0000  . Gerhardt's butt cream   Topical QID Regalado, Belkys A, MD      . hydrALAZINE (APRESOLINE) injection 10 mg  10 mg Intravenous Q6H PRN Regalado, Belkys A, MD      . HYDROcodone-acetaminophen (NORCO/VICODIN) 5-325 MG per tablet 1 tablet  1 tablet Oral Q6H PRN Florencia Reasons, MD   1 tablet at 03/24/19 1857  . ipratropium-albuterol (DUONEB) 0.5-2.5 (3) MG/3ML nebulizer solution 3 mL  3 mL Nebulization Q4H PRN Bodenheimer, Charles A, NP   3 mL at 03/25/19 2240  . irbesartan (AVAPRO) tablet 300 mg  300 mg Oral Daily Samuella Cota, MD   300 mg at 03/29/19 0947  . loperamide (IMODIUM) capsule 2 mg  2 mg Oral BID Willia Craze, NP      . mirabegron ER [facility]) tablet 50 mg  50 mg Oral Q supper Florencia Reasons, MD   50 mg at 03/28/19 1717  . polyvinyl alcohol (LIQUIFILM TEARS) 1.4 % ophthalmic solution   Both Eyes Daily PRN Florencia Reasons, MD      . potassium chloride SA (K-DUR) CR tablet 40 mEq  40 mEq Oral BID Regalado, Belkys A, MD   40 mEq at 03/29/19 0945  . rosuvastatin (CRESTOR) tablet 20 mg  20 mg Oral Q supper Florencia Reasons, MD   20 mg at 03/28/19 1717  . saccharomyces boulardii (FLORASTOR) capsule 250 mg  250 mg Oral BID Cirigliano, Vito V, DO   250 mg at 03/29/19 0946  . tamsulosin (FLOMAX) capsule 0.4 mg  0.4 mg Oral QPM Florencia Reasons, MD   0.4 mg at 03/28/19 1718  . traZODone (DESYREL) tablet 50 mg  50 mg Oral QHS PRN Vertis Kelch, NP   50 mg at 03/28/19 2143  . vancomycin (VANCOCIN) 50 mg/mL oral solution 250 mg  250 mg Oral QID Alonza Bogus D, PA-C   250 mg at 03/29/19 2595   Followed by  . [START ON 04/02/2019] vancomycin (VANCOCIN) 50 mg/mL oral solution 250 mg  250 mg Oral BID Zehr, Jessica D, PA-C       Followed by  . [START ON 04/09/2019] vancomycin (VANCOCIN) 50 mg/mL oral solution 250 mg  250 mg Oral Daily Zehr, Jessica D, PA-C       Followed by  . [START ON 04/16/2019] vancomycin (VANCOCIN) 50 mg/mL oral solution 250 mg  250 mg Oral QODAY Zehr, Jessica D, PA-C       Followed  by  . [START ON 05/14/2019] vancomycin (VANCOCIN) 50 mg/mL oral solution 250 mg  250 mg Oral Q3 days Zehr, Laban Emperor, PA-C         Discharge Medications: Please see discharge summary for a list of discharge medications.  Relevant Imaging  Results:  Relevant Lab Results:   Additional Information [id number]  Nila Nephew, LCSW

## 2019-03-29 NOTE — Progress Notes (Addendum)
     Progress Note    ASSESSMENT AND PLAN:   Recurrent C-diff infection. Yesterday's 24 hour stool output (flexiseal bag) was  90 ml. However, there were ~ 6 episodes of stool leakage around the flexiseal.  Stool consistency about same as yesterday ( thick liquid).  -WBC normal, renal function normal.  -He really wants to start getting OOB with help of PT. Had in bed exercises done yesterday and happy about that. Will d/c flexiseal, add Imodium and see how he does. -continue Vanco taper, Dificid, Colestid and probiotics.    SUBJECTIVE    Feels okay, happy about working with PT yesterday. Feels himself passing stool at times - a good sign   OBJECTIVE:     Vital signs in last 24 hours: Temp:  [98.6 F (37 C)-99.5 F (37.5 C)] 99.5 F (37.5 C) (08/18 0605) Pulse Rate:  [54-74] 54 (08/18 0605) Resp:  [17] 17 (08/18 0605) BP: (121-163)/(80-85) 143/80 (08/18 0605) SpO2:  [94 %-98 %] 94 % (08/18 0605) Weight:  [81.3 kg] 81.3 kg (08/18 0605) Last BM Date: 03/29/19 General:   Alert, well-developed male in NAD Heart:  Regular rate  No lower extremity edema   Pulm: Normal respiratory effort Abdomen:  Soft, nondistended, nontender.  Normal bowel sounds.  Neurologic:  Alert and  oriented x4;  grossly normal neurologically. Psych:  Pleasant, cooperative.  Normal mood and affect.   Intake/Output from previous day: 08/17 0701 - 08/18 0700 In: 480 [P.O.:480] Out: 3240 [Urine:3150; Stool:90] Intake/Output this shift: No intake/output data recorded.  Lab Results: Recent Labs    03/27/19 0552 03/28/19 0539 03/29/19 0532  WBC 11.0* 10.1 8.5  HGB 10.8* 11.3* 11.6*  HCT 34.4* 36.7* 36.4*  PLT 371 360 367   BMET Recent Labs    03/27/19 0552 03/28/19 0539 03/29/19 0532  NA 140 139 137  K 3.8 4.2 3.4*  CL 107 106 100  CO2 27 27 28   GLUCOSE 97 99 102*  BUN 11 13 13   CREATININE 0.85 0.86 0.83  CALCIUM 7.8* 7.8* 7.7*    Dg Chest 2 View  Result Date: 03/28/2019 CLINICAL  DATA:  History of pleural effusions. Patient reports shortness of breath and weakness. EXAM: CHEST - 2 VIEW COMPARISON:  Chest radiographs 03/25/2019 FINDINGS: Status post median sternotomy. Cardiomegaly. Persistent pulmonary vascular congestion. Bilateral pleural effusions with bibasilar opacities, not significantly changed from prior exam. No evidence of pneumothorax. No acute bony abnormality IMPRESSION: No significant interval change as compared to chest radiograph 03/25/2019. Cardiomegaly with persistent pulmonary vascular congestion. Persistent bilateral pleural effusions (greater on the right). Bibasilar opacities may reflect atelectasis and/or pneumonia. Electronically Signed   By: Kellie Simmering   On: 03/28/2019 09:19     Principal Problem:   C. difficile colitis Active Problems:   Hypokalemia   Acute kidney injury ([place])   Pancolitis ([place])   Moderate malnutrition ([place])     LOS: 11 days   Tye Savoy ,NP 03/29/2019, 9:42 AM  GI ATTENDING  Interval history data reviewed.  Agree with interval progress note as outlined above.  Patient is on multiple appropriate therapies for recurrent C. difficile related diarrhea.  I want him on vancomycin 250 mg 4 times daily for a minimum of 2 weeks before initiating taper as this has been a refractory case.  Continue other concurrent therapies as well including Dificid and Colestid.  Low-dose Imodium added.  Docia Chuck. Geri [place]., M.D. [facility] Division of Gastroenterology

## 2019-03-30 ENCOUNTER — Telehealth: Payer: Self-pay

## 2019-03-30 LAB — SARS CORONAVIRUS 2 (TAT 6-24 HRS): SARS Coronavirus 2: NEGATIVE

## 2019-03-30 LAB — MAGNESIUM: Magnesium: 1.9 mg/dL (ref 1.7–2.4)

## 2019-03-30 MED ORDER — POTASSIUM CHLORIDE CRYS ER 20 MEQ PO TBCR
40.0000 meq | EXTENDED_RELEASE_TABLET | Freq: Once | ORAL | Status: AC
  Administered 2019-03-30: 09:00:00 40 meq via ORAL
  Filled 2019-03-30: qty 2

## 2019-03-30 MED ORDER — LOPERAMIDE HCL 2 MG PO CAPS
2.0000 mg | ORAL_CAPSULE | ORAL | Status: DC | PRN
  Administered 2019-03-30: 2 mg via ORAL

## 2019-03-30 NOTE — TOC Progression Note (Signed)
Transition of Care [facility]) - Progression Note    Patient Details  Name: [name] [name] MRN: [id number] Date of Birth: 21-Oct-1929  Transition of Care [facility]) CM/SW [place], [place] Phone Number: [phone] 03/30/2019, 12:21 PM  Clinical Narrative:   Damaris Schooner with [facility] preparing for pt's admission as early as tomorrow.  (Pt is independent living resident there). Per SNF will need updated covid test prior to admitting (last test was 03/18/19). Will assist with transition to [facility] at [place] when ready.    Expected Discharge Plan: [place] Barriers to Discharge: Continued Medical Work up  Expected Discharge Plan and Services Expected Discharge Plan: [place] In-house Referral: Clinical Social Work   Post Acute Care Choice: [place] Living arrangements for the past 2 months: [place]                                       Social Determinants of Health (SDOH) Interventions    Readmission Risk Interventions No flowsheet data found.

## 2019-03-30 NOTE — Progress Notes (Signed)
Occupational Therapy Treatment Patient Details Name: [name] [name] MRN: [id number] DOB: 03-22-1930 Today's Date: 03/30/2019    History of present illness 85 year old white male with relapsing C. difficile colitis with moderate diffuse colitis on imaging, admitted with leukocytosis, acute kidney injury and hypokalemia.   OT comments  Pt plans to go back to SNF [facility].   Pt currently needs A with ADL activity and will benefit from rehab at [facility]   Follow Up Recommendations  SNF    Equipment Recommendations  None recommended by OT    Recommendations for Other Services      Precautions / Restrictions Precautions Precautions: Fall;Other (comment) Precaution Comments: contact precautions- C Diff Restrictions Weight Bearing Restrictions: No       Mobility Bed Mobility Overal bed mobility: Needs Assistance Bed Mobility: Sidelying to Sit;Rolling;Sit to Supine Rolling: Min assist Sidelying to sit: Min assist;HOB elevated Supine to sit: Min assist        Transfers Overall transfer level: Needs assistance Equipment used: Rolling walker (2 wheeled) Transfers: Sit to/from [facility] Sit to Stand: Min assist;Mod assist;From elevated surface Stand pivot transfers: Min assist       General transfer comment: Min assist for power up and steadying from bed and elevated surface, requires mod assist for stand from recliner due to low surface. Verbal cuing for hand placement upon rising on both stand attempts    Balance Overall balance assessment: Needs assistance Sitting-balance support: Feet supported Sitting balance-Leahy Scale: Good     Standing balance support: Bilateral upper extremity supported Standing balance-Leahy Scale: Poor Standing balance comment: reliant on UEs for balance and assist from PT                           ADL either performed or assessed with clinical judgement   ADL Overall ADL's : Needs  assistance/impaired Eating/Feeding: Set up;Sitting   Grooming: Set up;Sitting   Upper Body Bathing: Minimal assistance;Sitting   Lower Body Bathing: Moderate assistance;Sit to/from stand;Cueing for compensatory techniques;Cueing for sequencing;Cueing for safety   Upper Body Dressing : Set up;Sitting   Lower Body Dressing: Moderate assistance;Cueing for safety;Sit to/from stand   Toilet Transfer: Moderate assistance;BSC;RW;Stand-pivot;Cueing for safety   Toileting- Clothing Manipulation and Hygiene: Minimal assistance;Sit to/from stand;Cueing for safety;Cueing for compensatory techniques         General ADL Comments: pt agreed to OOB to chair.     Vision Patient Visual Report: No change from baseline     Perception     Praxis      Cognition Arousal/Alertness: Awake/alert Behavior During Therapy: WFL for tasks assessed/performed Overall Cognitive Status: Within Functional Limits for tasks assessed                                                     Pertinent Vitals/ Pain       Pain Score: 3          Frequency  Min 2X/week        Progress Toward Goals  OT Goals(current goals can now be found in the care plan section)  Progress towards OT goals: Progressing toward goals     Plan Discharge plan remains appropriate    Co-evaluation                 AM-PAC  OT "6 Clicks" Daily Activity     Outcome Measure   Help from another person eating meals?: None Help from another person taking care of personal grooming?: None Help from another person toileting, which includes using toliet, bedpan, or urinal?: A Lot Help from another person bathing (including washing, rinsing, drying)?: A Lot Help from another person to put on and taking off regular upper body clothing?: A Little Help from another person to put on and taking off regular lower body clothing?: A Lot 6 Click Score: 17    End of Session Equipment Utilized During Treatment:  Gait belt;Rolling walker  OT Visit Diagnosis: Unsteadiness on feet (R26.81);Muscle weakness (generalized) (M62.81)   Activity Tolerance Patient tolerated treatment well   Patient Left in chair;with call bell/phone within reach;with family/visitor present   Nurse Communication Mobility status        Time: 6681-5947 OT Time Calculation (min): 20 min  Charges: OT General Charges $OT Visit: 1 Visit OT Treatments $Self Care/Home Management : 8-22 mins  Kari Baars, [place] [phone] Office- [phone]      [place], Edwena Felty D 03/30/2019, 3:29 PM

## 2019-03-30 NOTE — Progress Notes (Signed)
PROGRESS NOTE                                                                                                                                                                                                             Patient Demographics:    [name] [name], is a 85 y.o. male, DOB - 03/30/1930, CLE:[id number]  Admit date - 03/18/2019   Admitting Physician Florencia Reasons, MD  Outpatient Primary MD for the patient is [name], MD  LOS - 12  Outpatient Specialists: Dr. Carlean Purl  Chief Complaint  Patient presents with  . Abdominal Pain       Brief Narrative  85 year old male with previous history of C. difficile x2 treated on both occasion with 2 weeks of vancomycin presented with 2 weeks history of diarrhea with left-sided abdominal pain, poor appetite and weakness.  CT abdomen pelvis showed pancolitis and patient being treated with prolonged course of C. difficile taper.  He is now started on oral vancomycin and Dificid and added Colestid as patient was having persistent diarrhea requiring Flexi-Seal. Hospital course prolonged with development of acute pulmonary edema requiring intermittent diuresis. Over the past 24 hours Flexi-Seal removed, has not had any bowel movement and dyspnea resolved.   Subjective:   No episode of diarrhea for the past 24 hours.  Denies abdominal pain and no further dyspnea.  Patient seems frustrated being in the hospital   Assessment  & Plan :    Principal Problem:   C. difficile colitis, recurrent Pancolitis on presentation.  On oral vancomycin (dose increased to 250 mg 4 times daily) with plan to treat for 2 weeks followed by prolonged taper.  Also on Dificid for at least 2 weeks and will be seen by GI as outpatient and determine duration of treatment (has appointment on 9/2). GI also placed him on Colestid 1 g twice daily for 3-5 days. Flexi-Seal has been removed and has not had bowel movement  since yesterday. Was started on Imodium but discontinued today after discussing with GI. GI also considering use of Zinplava infusion as outpatient to prevent recurrence.  If no further diarrhea and tolerating diet we will plan to discharge him to SNF tomorrow.  Active Problems: Acute pulmonary edema Likely acute on chronic diastolic CHF.  Received intermittent Lasix and now euvolemic.  Continue incentive spirometry.   Good  urinary output with -1.5 L since admission.  IV Lasix discontinued.     Hypokalemia/hypomagnesemia Secondary to GI loss.  Replenished    Acute kidney injury ([place]) Likely prerenal initially with dehydration.  Resolved with IV fluids.  HCTZ held.  Off fluids due to pulmonary edema    Moderate protein calorie malnutrition (HCC) Added supplement  Sinus bradycardia Heart rate in the 50s.  Potassium replenished and trazodone dose reduced  Essential hypertension Continue ARB.  HCTZ held  Generalized weakness Seen by PT and recommends SNF.  Patient lives at independent living at [facility] and has a bed at the rehab.  Possibly discharge in a.m. if no further GI symptoms    Code Status : Full code  Family Communication  : Son updated on the phone  Disposition Plan  : SNF possibly on 8/20  Barriers For Discharge : Improving symptoms  Consults  : GI  Procedures  : CT abdomen  DVT Prophylaxis  :  Lovenox -  Lab Results  Component Value Date   PLT 367 03/29/2019    Antibiotics  :   Anti-infectives (From admission, onward)   Start     Dose/Rate Route Frequency Ordered Stop   05/20/19 1000  vancomycin (VANCOCIN) 50 mg/mL oral solution 250 mg     250 mg Oral Every 3 DAYS 03/25/19 1219 06/19/19 0959   05/14/19 1000  vancomycin (VANCOCIN) 50 mg/mL oral solution 125 mg  Status:  Discontinued     125 mg Oral Every 3 DAYS 03/18/19 1313 03/25/19 1219   04/22/19 1000  vancomycin (VANCOCIN) 50 mg/mL oral solution 250 mg     250 mg Oral Every other day 03/25/19  1219 05/20/19 0959   04/16/19 1000  vancomycin (VANCOCIN) 50 mg/mL oral solution 125 mg  Status:  Discontinued     125 mg Oral Every other day 03/18/19 1313 03/25/19 1219   04/15/19 1000  vancomycin (VANCOCIN) 50 mg/mL oral solution 250 mg     250 mg Oral Daily 03/25/19 1219 04/22/19 0959   04/09/19 1000  vancomycin (VANCOCIN) 50 mg/mL oral solution 125 mg  Status:  Discontinued     125 mg Oral Daily 03/18/19 1313 03/25/19 1219   04/08/19 1000  vancomycin (VANCOCIN) 50 mg/mL oral solution 250 mg     250 mg Oral 2 times daily 03/25/19 1219 04/15/19 0959   04/01/19 2200  vancomycin (VANCOCIN) 50 mg/mL oral solution 125 mg  Status:  Discontinued     125 mg Oral 2 times daily 03/18/19 1313 03/25/19 1219   03/29/19 2115  ciprofloxacin (CIPRO) IVPB 400 mg     400 mg 200 mL/hr over 60 Minutes Intravenous  Once 03/29/19 2103 03/29/19 2221   03/25/19 1400  vancomycin (VANCOCIN) 50 mg/mL oral solution 250 mg     250 mg Oral 4 times daily 03/25/19 1219 04/07/19 2359   03/25/19 1215  vancomycin (VANCOCIN) 50 mg/mL oral solution 125 mg  Status:  Discontinued     125 mg Oral Every 6 hours 03/25/19 1206 03/25/19 1213   03/23/19 0000  fidaxomicin (DIFICID) 200 MG TABS tablet  Status:  Discontinued     200 mg Oral 2 times daily 03/23/19 1449 03/28/19    03/22/19 2200  fidaxomicin (DIFICID) tablet 200 mg     200 mg Oral 2 times daily 03/22/19 1652 04/01/19 2159   03/18/19 1415  metroNIDAZOLE (FLAGYL) IVPB 500 mg  Status:  Discontinued     500 mg 100 mL/hr over 60 Minutes Intravenous Every  8 hours 03/18/19 1400 03/22/19 1753   03/18/19 1400  vancomycin (VANCOCIN) 50 mg/mL oral solution 125 mg  Status:  Discontinued     125 mg Oral 4 times daily 03/18/19 1313 03/25/19 1219        Objective:   Vitals:   03/29/19 1338 03/29/19 1406 03/29/19 2105 03/30/19 0552  BP: (!) 128/104 140/90 140/82 135/68  Pulse:  63 78 76  Resp: 18  20 16   Temp:   98.6 F (37 C) 98.2 F (36.8 C)  TempSrc:   Oral Oral   SpO2: 94% 98% 97% 98%  Weight:      Height:        Wt Readings from Last 3 Encounters:  03/29/19 81.3 kg  03/15/19 82.1 kg  03/04/19 79.4 kg     Intake/Output Summary (Last 24 hours) at 03/30/2019 1516 Last data filed at 03/30/2019 1135 Gross per 24 hour  Intake 240 ml  Output 1900 ml  Net -1660 ml     Physical Exam  Gen: not in distress HEENT: Pallor present moist mucosa, supple neck Chest: clear b/l, no added sounds CVS: N S1&S2, no murmurs, GI: soft, NT, ND, BS+ Musculoskeletal: warm, no edema     Data Review:    CBC Recent Labs  Lab 03/25/19 0538 03/26/19 0552 03/27/19 0552 03/28/19 0539 03/29/19 0532  WBC 9.4 11.7* 11.0* 10.1 8.5  HGB 10.8* 11.6* 10.8* 11.3* 11.6*  HCT 35.7* 36.8* 34.4* 36.7* 36.4*  PLT 339 376 371 360 367  MCV 94.4 91.8 93.5 94.8 92.4  MCH 28.6 28.9 29.3 29.2 29.4  MCHC 30.3 31.5 31.4 30.8 31.9  RDW 14.0 14.4 14.6 14.9 15.0    Chemistries  Recent Labs  Lab 03/24/19 0535 03/25/19 0538 03/26/19 0552 03/27/19 0552 03/28/19 0539 03/29/19 0532 03/30/19 0852  NA 139 136 138 140 139 137  --   K 3.2* 3.7 3.1* 3.8 4.2 3.4*  --   CL 108 105 105 107 106 100  --   CO2 25 23 26 27 27 28   --   GLUCOSE 105* 93 107* 97 99 102*  --   BUN 11 10 10 11 13 13   --   CREATININE 0.81 0.79 0.77 0.85 0.86 0.83  --   CALCIUM 7.6* 7.4* 7.7* 7.8* 7.8* 7.7*  --   MG 1.7 1.8  --  1.8  --   --  1.9  AST  --   --  27  --   --   --   --   ALT  --   --  25  --   --   --   --   ALKPHOS  --   --  58  --   --   --   --   BILITOT  --   --  0.5  --   --   --   --    ------------------------------------------------------------------------------------------------------------------ No results for input(s): CHOL, HDL, LDLCALC, TRIG, CHOLHDL, LDLDIRECT in the last 72 hours.  No results found for: HGBA1C ------------------------------------------------------------------------------------------------------------------ No results for input(s): TSH, T4TOTAL,  T3FREE, THYROIDAB in the last 72 hours.  Invalid input(s): FREET3 ------------------------------------------------------------------------------------------------------------------ No results for input(s): VITAMINB12, FOLATE, FERRITIN, TIBC, IRON, RETICCTPCT in the last 72 hours.  Coagulation profile No results for input(s): INR, PROTIME in the last 168 hours.  No results for input(s): DDIMER in the last 72 hours.  Cardiac Enzymes No results for input(s): CKMB, TROPONINI, MYOGLOBIN in the last 168 hours.  Invalid input(s): CK ------------------------------------------------------------------------------------------------------------------  Component Value Date/Time   BNP 64.1 11/06/2010 1609    Inpatient Medications  Scheduled Meds: . aspirin EC  81 mg Oral Q supper  . B-complex with vitamin C  1 tablet Oral Daily  . cholecalciferol  1,000 Units Oral Daily  . colestipol  1 g Oral BID  . dicyclomine  10 mg Oral TID AC & HS  . dutasteride  0.5 mg Oral QPM  . enoxaparin (LOVENOX) injection  40 mg Subcutaneous Q24H  . feeding supplement  1 Container Oral TID BM  . fidaxomicin  200 mg Oral BID  . Gerhardt's butt cream   Topical QID  . irbesartan  300 mg Oral Daily  . mirabegron ER  50 mg Oral Q supper  . rosuvastatin  20 mg Oral Q supper  . saccharomyces boulardii  250 mg Oral BID  . tamsulosin  0.4 mg Oral QPM  . vancomycin  250 mg Oral QID   Followed by  . [START ON 04/08/2019] vancomycin  250 mg Oral BID   Followed by  . [START ON 04/15/2019] vancomycin  250 mg Oral Daily   Followed by  . [START ON 04/22/2019] vancomycin  250 mg Oral QODAY   Followed by  . [START ON 05/20/2019] vancomycin  250 mg Oral Q3 days   Continuous Infusions: . sodium chloride    . sodium chloride 20 mL/hr at 03/29/19 2119   PRN Meds:.hydrALAZINE, HYDROcodone-acetaminophen, ipratropium-albuterol, polyvinyl alcohol, traZODone  Micro Results No results found for this or any previous visit (from  the past 240 hour(s)).  Radiology Reports Dg Chest 2 View  Result Date: 03/28/2019 CLINICAL DATA:  History of pleural effusions. Patient reports shortness of breath and weakness. EXAM: CHEST - 2 VIEW COMPARISON:  Chest radiographs 03/25/2019 FINDINGS: Status post median sternotomy. Cardiomegaly. Persistent pulmonary vascular congestion. Bilateral pleural effusions with bibasilar opacities, not significantly changed from prior exam. No evidence of pneumothorax. No acute bony abnormality IMPRESSION: No significant interval change as compared to chest radiograph 03/25/2019. Cardiomegaly with persistent pulmonary vascular congestion. Persistent bilateral pleural effusions (greater on the right). Bibasilar opacities may reflect atelectasis and/or pneumonia. Electronically Signed   By: Kellie Simmering   On: 03/28/2019 09:19   Dg Chest 2 View  Result Date: 03/21/2019 CLINICAL DATA:  Dyspnea weakness EXAM: CHEST - 2 VIEW COMPARISON:  June 10, 2016 FINDINGS: Trace bilateral pleural effusions are seen. No large airspace consolidation. There is mild cardiomegaly with overlying median sternotomy wires. No acute osseous findings. IMPRESSION: Trace bilateral pleural effusions. Electronically Signed   By: Prudencio Pair M.D.   On: 03/21/2019 16:13   Ct Abdomen Pelvis W Contrast  Result Date: 03/18/2019 CLINICAL DATA:  Generalized abdominal pain with weakness and diarrhea. Recent Clostridium difficile infection. Diverticulitis suspected. EXAM: CT ABDOMEN AND PELVIS WITH CONTRAST TECHNIQUE: Multidetector CT imaging of the abdomen and pelvis was performed using the standard protocol following bolus administration of intravenous contrast. CONTRAST:  50m OMNIPAQUE IOHEXOL 300 MG/ML  SOLN COMPARISON:  Abdominopelvic CT 08/16/2014.  MRCP 09/20/2015. FINDINGS: Lower chest: There are new patchy dependent opacities at both lung bases, likely atelectasis. There is a small right pleural effusion. The heart is enlarged post median  sternotomy. There is atherosclerosis of the aorta and coronary arteries and prominent calcifications of the aortic valve. Hepatobiliary: Cystic lesion peripherally in the left hepatic lobe measuring 3.6 cm on image 21/2 is similar to the previous study. There is similar pneumobilia with intra and extrahepatic biliary dilatation. The common hepatic duct measures up  to 1.6 cm in diameter. No new hepatic lesions identified. Pancreas: Stable mild dilatation of the pancreatic duct. No focal pancreatic abnormality or surrounding inflammation. Spleen: Normal in size without focal abnormality. Adrenals/Urinary Tract: Both adrenal glands appear normal. Similar dominant cyst involving the upper pole of the right kidney, bilateral nephrolithiasis and bilateral renal sinus cysts. The contrast bolus is suboptimal, and no significant contrast excretion is identified on early delayed post-contrast images through the kidneys. There is no hydronephrosis or evidence of ureteral calculus. The bladder appears normal. Stomach/Bowel: No enteric contrast was administered. The stomach is decompressed. Small bowel appears unremarkable. There is moderate diffuse colonic wall thickening consistent with colitis. Underlying diverticular changes are present within the descending and sigmoid colon, and the wall thickening in this area appears slightly greater. No evidence of bowel perforation, obstruction or abscess. Vascular/Lymphatic: There are no enlarged abdominal or pelvic lymph nodes. Moderate aortic and branch vessel atherosclerosis. No acute vascular findings. Reproductive: The prostate gland is moderately enlarged, but stable. Other: Stable subxiphoid hernia containing fat.  No ascites. Musculoskeletal: No acute osseous findings. Stable multilevel spondylosis associated with a convex right scoliosis. IMPRESSION: 1. Diffuse colonic wall thickening and mild surrounding inflammation consistent with pancolitis, presumably from the patient's  known Clostridium difficile colitis. There are underlying moderate diverticular changes in the sigmoid colon, and the wall thickening in this area is slightly greater, potentially due to superimposed diverticulitis. 2. No evidence of bowel obstruction, perforation or abscess. 3. Stable chronic biliary dilatation and pneumobilia. 4. New small right pleural effusion and probable bibasilar atelectasis. 5. Stable additional incidental findings including calcifications of the aortic valve, hepatic and renal cysts, bilateral nephrolithiasis, a ventral abdominal wall hernia, lumbar spondylosis and Aortic Atherosclerosis (ICD10-I70.0). Electronically Signed   By: Richardean Sale M.D.   On: 03/18/2019 13:20   Dg Chest Port 1 View  Result Date: 03/25/2019 CLINICAL DATA:  Wheezing. EXAM: PORTABLE CHEST 1 VIEW COMPARISON:  Radiograph 03/21/2019 FINDINGS: Progressive hazy bibasilar opacities, right greater than left, likely increasing pleural effusions. Post median sternotomy with stable cardiomegaly. Progressive interstitial thickening with Kerley B-lines consistent with pulmonary edema. No pneumothorax. IMPRESSION: Pulmonary edema with progressive bilateral pleural effusions over the past 4 days consistent with fluid overload/CHF. Electronically Signed   By: Keith Rake M.D.   On: 03/25/2019 21:38   Dg Ercp  Result Date: 03/04/2019 CLINICAL DATA:  Choledocholithiasis. EXAM: ERCP TECHNIQUE: Multiple spot images obtained with the fluoroscopic device and submitted for interpretation post-procedure. COMPARISON:  None. FINDINGS: Imaging during ERCP demonstrates cannulation of the common bile duct with cholangiogram demonstrating diffuse dilatation of opacified bile ducts and suggestion of multiple filling defects in the common bile duct. There is evidence of prior cholecystectomy. Balloon sweep maneuver was performed to extract calculi. IMPRESSION: Choledocholithiasis and biliary dilatation. Balloon sweep maneuver was  performed to extract calculi. These images were submitted for radiologic interpretation only. Please see the procedural report for the amount of contrast and the fluoroscopy time utilized. Electronically Signed   By: Aletta Edouard M.D.   On: 03/04/2019 09:25    Time Spent in minutes  25   Jamelyn Bovard M.D on 03/30/2019 at 3:16 PM  Between 7am to 7pm - Pager - [phone]  After 7pm go to [email or web address] - password [facility]  [facility] -  Office  [phone]

## 2019-03-30 NOTE — Telephone Encounter (Signed)
-----   Message from Willia Craze, NP sent at 03/30/2019 11:27 AM EDT ----- Eustaquio Maize, please call this patient tomorrow for a hospital follow up. Has to be seen within 2 weeks. Thanks

## 2019-03-30 NOTE — Progress Notes (Signed)
      Progress Note   Subjective  Patient states he is doing well this AM. He denies shortness of breath. Flexiseal pulled yesterday and he has not had any bowel movements since that time. No abdominal pain. Feeling well, wants to get out of bed, hoping to go to rehab soon.   Objective   Vital signs in last 24 hours: Temp:  [98.2 F (36.8 C)-98.6 F (37 C)] 98.2 F (36.8 C) (08/19 0552) Pulse Rate:  [63-78] 76 (08/19 0552) Resp:  [16-20] 16 (08/19 0552) BP: (128-140)/(68-104) 135/68 (08/19 0552) SpO2:  [94 %-98 %] 98 % (08/19 0552) Last BM Date: 03/29/19 General:    white male in NAD Heart:  Regular rate and rhythm Lungs: Respirations even and unlabored, lungs CTA bilaterally Abdomen:  Soft, nontender and nondistended.  Extremities:  Without edema. Neurologic:  Alert and oriented,  grossly normal neurologically. Psych:  Cooperative. Normal mood and affect.  Intake/Output from previous day: 08/18 0701 - 08/19 0700 In: -  Out: 550 [Urine:550] Intake/Output this shift: No intake/output data recorded.  Lab Results: Recent Labs    03/28/19 0539 03/29/19 0532  WBC 10.1 8.5  HGB 11.3* 11.6*  HCT 36.7* 36.4*  PLT 360 367   BMET Recent Labs    03/28/19 0539 03/29/19 0532  NA 139 137  K 4.2 3.4*  CL 106 100  CO2 27 28  GLUCOSE 99 102*  BUN 13 13  CREATININE 0.86 0.83  CALCIUM 7.8* 7.7*   LFT No results for input(s): PROT, ALBUMIN, AST, ALT, ALKPHOS, BILITOT, BILIDIR, IBILI in the last 72 hours. PT/INR No results for input(s): LABPROT, INR in the last 72 hours.  Studies/Results: Dg Chest 2 View  Result Date: 03/28/2019 CLINICAL DATA:  History of pleural effusions. Patient reports shortness of breath and weakness. EXAM: CHEST - 2 VIEW COMPARISON:  Chest radiographs 03/25/2019 FINDINGS: Status post median sternotomy. Cardiomegaly. Persistent pulmonary vascular congestion. Bilateral pleural effusions with bibasilar opacities, not significantly changed from prior  exam. No evidence of pneumothorax. No acute bony abnormality IMPRESSION: No significant interval change as compared to chest radiograph 03/25/2019. Cardiomegaly with persistent pulmonary vascular congestion. Persistent bilateral pleural effusions (greater on the right). Bibasilar opacities may reflect atelectasis and/or pneumonia. Electronically Signed   By: Kellie Simmering   On: 03/28/2019 09:19       Assessment / Plan:   85 y/o male admitted with recurrent C Diff. Hospital course complicated by pulmonary edema. He has been here since 8/7 on multi drug regimen for C diff with oral vancomycin, Dificid, colestid, and recently immodium. Flexiseal removed yesterday and he's not had any bowel movements since that time, doing quite well today and quite motivated to get out of the hospital to rehab. Looks like fecal transplant not possible due to COVID 19. I would consider Zinplava to help prevent recurrence when outpatient, as this is not available inpatient. WBC normal, renal function normal.   Plan: - continue Vancomycin 249m QID for 2 weeks and then prolonged / slow taper - continue Dificid for now and colestid - continue immodium, will change from scheduled dosing to PRN for loose stools for now given clinical improvement in the past few days - PT, out of bed as much as he can. Hopefully if he continues to do well today can plan for disposition to rehab soon - consideration for Zinplava infusion as outpatient to help prevent recurrence  Please call with questions.  SCarolina Cellar MD [facility]

## 2019-03-30 NOTE — Telephone Encounter (Signed)
Appointment is scheduled for 04/13/19 at 11:30 am with Tye Savoy, [facility] Will call him on 03/31/19 with this information.

## 2019-03-31 DIAGNOSIS — J810 Acute pulmonary edema: Secondary | ICD-10-CM

## 2019-03-31 MED ORDER — SACCHAROMYCES BOULARDII 250 MG PO CAPS: 250.0000 mg | ORAL_CAPSULE | Freq: Two times a day (BID) | ORAL | 0 refills | Status: AC

## 2019-03-31 MED ORDER — COLESTIPOL HCL 1 G PO TABS: 1.0000 g | ORAL_TABLET | Freq: Two times a day (BID) | ORAL | 0 refills | Status: DC

## 2019-03-31 MED ORDER — BOOST BREEZE PO LIQD: 1.0000 | Freq: Two times a day (BID) | ORAL | 0 refills | Status: AC

## 2019-03-31 MED ORDER — HYDROCODONE-ACETAMINOPHEN 5-325 MG PO TABS: 1.0000 | ORAL_TABLET | Freq: Four times a day (QID) | ORAL | 0 refills | Status: AC | PRN

## 2019-03-31 MED ORDER — VANCOMYCIN 50 MG/ML ORAL SOLUTION: 250.0000 mg | Freq: Four times a day (QID) | ORAL | 0 refills | Status: DC

## 2019-03-31 MED ORDER — FIDAXOMICIN 200 MG PO TABS: 200.0000 mg | ORAL_TABLET | Freq: Two times a day (BID) | ORAL | 0 refills | Status: AC

## 2019-03-31 NOTE — Telephone Encounter (Signed)
Patient released to Rehab SNF. The appointment was included in the discharge summary and the IP AVS from the hospital.

## 2019-03-31 NOTE — Discharge Instructions (Signed)
Clostridioides Difficile Infection Clostridioides difficile (C. difficile or C. diff) infection is a condition that occurs when an overgrowth of C. diff bacteria causes irritation and swelling (inflammation) of the colon (colitis). It is typically associated with recent use of antibiotic medication. This infection can be passed from person to person (is contagious). You may also be exposed to the bacteria from the environment, such as from food or water, or from touching surfaces that have the bacteria on them. What are the causes? Certain bacteria normally live in the colon and help to digest food. This infection develops when the balance of bacteria in the colon is changed and the C. diff bacteria grow out of control. This is often caused by taking antibiotics. What increases the risk? You are more likely to develop this condition if you:  Need to take antibiotics for a long period of time.  Take certain antibiotics that kill a wide range of bacteria.  Are in the hospital.  Are older than 83 years of age.  Live in a place where there is a lot of contact with others, such as a nursing home.  Have had a C. diff infection before.  Have a weak defense (immune) system.  Take a medicine called a proton pump inhibitor over a long period of time.  Have serious underlying conditions, such as colon cancer.  Have had a gastrointestinal (GI) tract procedure or surgery. What are the signs or symptoms? Symptoms of this condition include:  Diarrhea. This may be bloody, watery, or yellow or green in color.  Fever.  Fatigue.  Loss of appetite.  Nausea.  Swelling, pain, or tenderness in the abdomen. How is this diagnosed? This condition is diagnosed with medical history and physical exam. You may also have other tests, including:  A test that checks for C. diff in your stool.  Blood tests.  Imaging tests, such as a CT scan of your abdomen. In rare cases, a sigmoidoscopy or  colonoscopy may be used to look directly into your colon. These procedures involve passing an instrument through your rectum to look at the inside of your colon. How is this treated? Treatment for this condition includes:  Stopping the antibiotics that you were on when the C. diff infection began. Do this only as told by your health care provider.  Taking certain antibiotics to stop C. diff from growing.  Taking donor stool from a healthy person and placing it into the colon (fecal transplant) through a colonoscope or nasogastric tube. This may be done if you have a recurrent infection.  Having surgery to remove the infected part of the colon. This is rare. Follow these instructions at home: Eating and drinking   Eat bland, easy-to-digest foods in small amounts as you are able. These foods include bananas, applesauce, rice, lean meats, toast, and crackers.  Follow specific instructions on how to get enough water into your body (rehydrate). This may include: ? Drinking clear fluids, such as water, ice chips, diluted fruit juice, and low-calorie sports drinks. ? Taking an oral rehydration solution (ORS). This is a drink that is sold at pharmacies and retail stores.  Avoid milk, caffeine, and alcohol.  Drink enough fluid to keep your urine pale yellow. General instructions  Take over-the-counter and prescription medicines only as told by your health care provider.  Take your antibiotic medicine as told by your health care provider. Do not stop taking the antibiotic, even if you start to feel better. You may stop taking it only  if your health care provider tells you to stop.  Wash your hands with soap and water.  Do not use medicines to help with diarrhea. You may use these medicines only if your health care provider tells you to.  Keep all follow-up visits as told by your health care provider. This is important. How is this prevented? Hand hygiene   Use soap and water to wash your  hands thoroughly before you prepare food and after you use the bathroom. Make sure that people who live with you also wash their hands often.  If you are being treated at a hospital or clinic, make sure that all health care providers wash their hands with soap and water before touching you.  If you are staying in the hospital, make sure that all visitors wash their hands with soap and water before touching you. Contact precautions  If you develop diarrhea while in the hospital or a long-term care facility, let your health care team know right away.  When visiting someone in the hospital or a long-term care facility, follow guidelines for wearing a gown, gloves, or other protective equipment.  If possible, avoid contact with people who have diarrhea. Clean environment  Clean surfaces with a product that contains chlorine bleach.  If you are in the hospital, make sure that staff members clean the surfaces in your room daily. Let a staff person know right away if body fluids have splashed or spilled in your room.  Use chlorine bleach and high heat when cleaning soiled clothing or linens. Contact a health care provider if:  Your symptoms do not get better with treatment.  Your symptoms get worse, even with treatment.  Your symptoms go away and then come back.  You have a fever.  You have new symptoms. Get help right away if you:  Have more pain or tenderness in your abdomen.  Have stool that is mostly bloody, or your stool looks dark black and tarry.  Cannot eat or drink without vomiting.  Have signs or symptoms of dehydration, such as: ? Dark urine, very little urine, or no urine. ? Cracked lips. ? Not making tears when you cry. ? Dry mouth. ? Sunken eyes. ? Sleepiness. ? Weakness. ? Dizziness. Summary  Clostridioides difficile (C. difficile or C. diff) infection is a condition that occurs when an overgrowth of C. diff bacteria causes irritation and swelling  (inflammation) of the colon; typically after taking antibiotics.  Symptoms of C. diff infection include diarrhea, fever, fatigue, loss of appetite, nausea, and swelling, pain, or tenderness in the abdomen.  C. diff infection is treated by stopping the antibiotics you were on when the C. diff infection began, and then taking certain antibiotics to keep C. diff from growing. In some cases, fecal transplant or surgery is performed.  Hand washing with soap and water, contact precautions, and a clean environment can help prevent or limit the spread of C. diff infection. This information is not intended to replace advice given to you by your health care provider. Make sure you discuss any questions you have with your health care provider. Document Released: 05/07/2005 Document Revised: 04/01/2018 Document Reviewed: 04/01/2018 [employer] Patient Education  2020 [employer].

## 2019-03-31 NOTE — Discharge Summary (Signed)
Physician Discharge Summary  NAI DASCH HER:[id number] DOB: August 26, 1929 DOA: 03/18/2019  PCP: [name], MD  Admit date: 03/18/2019 Discharge date: 03/31/2019  Admitted From: Independent living Disposition: Skilled nursing facility  Recommendations for Outpatient Follow-up:  1. Follow up with MD at SNF in 1 week 2. Follow-up with [facility] on 9/2 at 11:30 AM   Equipment/Devices: None  Discharge Condition: Fair CODE STATUS: Full code Diet recommendation: Heart Healthy     Discharge Diagnoses:  Principal Problem:   C. difficile colitis, recurrent  Active Problems:   Hypokalemia Acute on chronic diastolic chf   Acute kidney injury ([place])   Pancolitis (HCC)   Moderate malnutrition ([place])   Acute pulmonary edema (HCC)  Brief narrative/HPI Please refer to admission H&P for details, in brief,85 year old male with previous history of C. difficile x2 treated on both occasion with 2 weeks of vancomycin presented with 2 weeks history of diarrhea with left-sided abdominal pain, poor appetite and weakness.  CT abdomen pelvis showed pancolitis and patient being treated with prolonged course of C. difficile taper.  He is now started on oral vancomycin and Dificid and added Colestid as patient was having persistent diarrhea requiring Flexi-Seal. Hospital course prolonged with development of acute pulmonary edema requiring intermittent diuresis. Over the past 48 hours Flexi-Seal removed, patient says he has 1 bowel movement, no abdominal pain or shortness of breath.  Hospital course  Principal Problem:   C. difficile colitis, recurrent Pancolitis on presentation.  On oral vancomycin (dose increased to 250 mg 4 times daily) with plan to treat for 2 weeks at this dose followed by prolonged taper over several weeks which GI will decide as outpatient.  Also on Dificid for 2 weeks and will be seen by GI as outpatient and determine duration of treatment (has appointment on 9/2). GI also  placed him on Colestid 1 g twice daily for 3-5 days.( 3 more days). continue probiotic. Flexi-Seal has been removed and no further diarrhea.  GI also considering use of Zinplava infusion as outpatient to prevent recurrence.  patient tolerating diet and stable for discharge.    Active Problems: Acute pulmonary edema Likely acute on chronic diastolic CHF.  Received intermittent Lasix and now euvolemic.  Continue incentive spirometry.   Good urinary output with -7.5 L since admission.  IV Lasix discontinued. now euvolemic.     Hypokalemia/hypomagnesemia Secondary to GI loss.  Replenished    Acute kidney injury ([place]) Likely prerenal initially with dehydration.  Resolved with IV fluids.  resume HCTZ-ARB    Moderate protein calorie malnutrition (HCC) Added supplement  Sinus bradycardia Heart rate in the 50s and stable.  Potassium replenished   Essential hypertension Continue ARB and HCTZ on discharge  Generalized weakness Seen by PT and recommends SNF.  Patient lives at independent living at [facility] and will be discharged to short term rehab.  Code 19 tested again on 8/19 and negative.     Family Communication  : Son updated on the phone  Disposition Plan  : SNF   Consults  : GI  Procedures  : CT abdomen Discharge Instructions   Allergies as of 03/31/2019   No Known Allergies     Medication List    STOP taking these medications   acetaminophen 500 MG tablet Commonly known as: TYLENOL   amLODipine 5 MG tablet Commonly known as: NORVASC   BENEFIBER PO     TAKE these medications   aspirin 81 MG tablet Take 81 mg by mouth daily with  supper.   b complex vitamins tablet Take 1 tablet by mouth daily.   colestipol 1 g tablet Commonly known as: COLESTID Take 1 tablet (1 g total) by mouth 2 (two) times daily for 3 days.   dutasteride 0.5 MG capsule Commonly known as: AVODART Take 0.5 mg by mouth every evening.   famotidine 20 MG  tablet Commonly known as: PEPCID Take 20 mg by mouth daily as needed for heartburn or indigestion.   feeding supplement (BOOST BREEZE) Liqd Take 1 Can by mouth 2 (two) times daily with a meal.   fidaxomicin 200 MG Tabs tablet Commonly known as: DIFICID Take 1 tablet (200 mg total) by mouth 2 (two) times daily.   Flomax 0.4 MG Caps capsule Generic drug: tamsulosin Take 0.4 mg by mouth every evening.   gabapentin 300 MG capsule Commonly known as: NEURONTIN Take 300 mg by mouth 4 (four) times daily.   GAS-X PO Take 1 tablet by mouth daily as needed (gas).   GLUCOSAMINE-CHONDROITIN PO Take 1 tablet by mouth 2 (two) times a day.   HYDROcodone-acetaminophen 5-325 MG tablet Commonly known as: NORCO/VICODIN Take 1 tablet by mouth every 6 (six) hours as needed for moderate pain.   Myrbetriq 50 MG Tb24 tablet Generic drug: mirabegron ER Take 1 tablet by mouth daily with supper.   rosuvastatin 20 MG tablet Commonly known as: CRESTOR Take 1 tablet (20 mg total) by mouth daily. What changed: when to take this   saccharomyces boulardii 250 MG capsule Commonly known as: FLORASTOR Take 1 capsule (250 mg total) by mouth 2 (two) times daily.   SYSTANE OP Place 1 drop into both eyes daily as needed (dry eyes).   valsartan-hydrochlorothiazide 320-12.5 MG tablet Commonly known as: DIOVAN-HCT Take 1 tablet by mouth every morning.   vancomycin 50 mg/mL  oral solution Commonly known as: VANCOCIN Take 5 mLs (250 mg total) by mouth 4 (four) times daily. Take 250 mg 4 times a day for 2 weeks (until 9/3) Then 250 mg 2 times a day for 7 days (9/4-9/10) Then 250 mg daily for next 7 days ( 9/11-9/18) Then 250 mg every other day for 14 days (9/19-10/4) Then 250 mg every 3 days until further instruction by the gastroenterologist   Vitamin D 1000 units capsule Take 1,000 Units by mouth daily.       Contact information for follow-up providers    Willia Craze, NP Follow up on  04/13/2019.   Specialty: Gastroenterology Why: 11:30 am Contact information: [place] [address] [phone]            Contact information for after-discharge care    Destination    [facility] .   Service: Skilled Nursing Contact information: [place] [phone]                 No Known Allergies   Procedures/Studies: Dg Chest 2 View  Result Date: 03/28/2019 CLINICAL DATA:  History of pleural effusions. Patient reports shortness of breath and weakness. EXAM: CHEST - 2 VIEW COMPARISON:  Chest radiographs 03/25/2019 FINDINGS: Status post median sternotomy. Cardiomegaly. Persistent pulmonary vascular congestion. Bilateral pleural effusions with bibasilar opacities, not significantly changed from prior exam. No evidence of pneumothorax. No acute bony abnormality IMPRESSION: No significant interval change as compared to chest radiograph 03/25/2019. Cardiomegaly with persistent pulmonary vascular congestion. Persistent bilateral pleural effusions (greater on the right). Bibasilar opacities may reflect atelectasis and/or pneumonia. Electronically Signed  By: Kellie Simmering   On: 03/28/2019 09:19   Dg Chest 2 View  Result Date: 03/21/2019 CLINICAL DATA:  Dyspnea weakness EXAM: CHEST - 2 VIEW COMPARISON:  June 10, 2016 FINDINGS: Trace bilateral pleural effusions are seen. No large airspace consolidation. There is mild cardiomegaly with overlying median sternotomy wires. No acute osseous findings. IMPRESSION: Trace bilateral pleural effusions. Electronically Signed   By: Prudencio Pair M.D.   On: 03/21/2019 16:13   Ct Abdomen Pelvis W Contrast  Result Date: 03/18/2019 CLINICAL DATA:  Generalized abdominal pain with weakness and diarrhea. Recent Clostridium difficile infection. Diverticulitis suspected. EXAM: CT ABDOMEN AND PELVIS WITH CONTRAST TECHNIQUE: Multidetector CT imaging of the  abdomen and pelvis was performed using the standard protocol following bolus administration of intravenous contrast. CONTRAST:  42m OMNIPAQUE IOHEXOL 300 MG/ML  SOLN COMPARISON:  Abdominopelvic CT 08/16/2014.  MRCP 09/20/2015. FINDINGS: Lower chest: There are new patchy dependent opacities at both lung bases, likely atelectasis. There is a small right pleural effusion. The heart is enlarged post median sternotomy. There is atherosclerosis of the aorta and coronary arteries and prominent calcifications of the aortic valve. Hepatobiliary: Cystic lesion peripherally in the left hepatic lobe measuring 3.6 cm on image 21/2 is similar to the previous study. There is similar pneumobilia with intra and extrahepatic biliary dilatation. The common hepatic duct measures up to 1.6 cm in diameter. No new hepatic lesions identified. Pancreas: Stable mild dilatation of the pancreatic duct. No focal pancreatic abnormality or surrounding inflammation. Spleen: Normal in size without focal abnormality. Adrenals/Urinary Tract: Both adrenal glands appear normal. Similar dominant cyst involving the upper pole of the right kidney, bilateral nephrolithiasis and bilateral renal sinus cysts. The contrast bolus is suboptimal, and no significant contrast excretion is identified on early delayed post-contrast images through the kidneys. There is no hydronephrosis or evidence of ureteral calculus. The bladder appears normal. Stomach/Bowel: No enteric contrast was administered. The stomach is decompressed. Small bowel appears unremarkable. There is moderate diffuse colonic wall thickening consistent with colitis. Underlying diverticular changes are present within the descending and sigmoid colon, and the wall thickening in this area appears slightly greater. No evidence of bowel perforation, obstruction or abscess. Vascular/Lymphatic: There are no enlarged abdominal or pelvic lymph nodes. Moderate aortic and branch vessel atherosclerosis. No  acute vascular findings. Reproductive: The prostate gland is moderately enlarged, but stable. Other: Stable subxiphoid hernia containing fat.  No ascites. Musculoskeletal: No acute osseous findings. Stable multilevel spondylosis associated with a convex right scoliosis. IMPRESSION: 1. Diffuse colonic wall thickening and mild surrounding inflammation consistent with pancolitis, presumably from the patient's known Clostridium difficile colitis. There are underlying moderate diverticular changes in the sigmoid colon, and the wall thickening in this area is slightly greater, potentially due to superimposed diverticulitis. 2. No evidence of bowel obstruction, perforation or abscess. 3. Stable chronic biliary dilatation and pneumobilia. 4. New small right pleural effusion and probable bibasilar atelectasis. 5. Stable additional incidental findings including calcifications of the aortic valve, hepatic and renal cysts, bilateral nephrolithiasis, a ventral abdominal wall hernia, lumbar spondylosis and Aortic Atherosclerosis (ICD10-I70.0). Electronically Signed   By: WRichardean SaleM.D.   On: 03/18/2019 13:20   Dg Chest Port 1 View  Result Date: 03/25/2019 CLINICAL DATA:  Wheezing. EXAM: PORTABLE CHEST 1 VIEW COMPARISON:  Radiograph 03/21/2019 FINDINGS: Progressive hazy bibasilar opacities, right greater than left, likely increasing pleural effusions. Post median sternotomy with stable cardiomegaly. Progressive interstitial thickening with Kerley B-lines consistent with pulmonary edema. No pneumothorax. IMPRESSION: Pulmonary edema  with progressive bilateral pleural effusions over the past 4 days consistent with fluid overload/CHF. Electronically Signed   By: Keith Rake M.D.   On: 03/25/2019 21:38   Dg Ercp  Result Date: 03/04/2019 CLINICAL DATA:  Choledocholithiasis. EXAM: ERCP TECHNIQUE: Multiple spot images obtained with the fluoroscopic device and submitted for interpretation post-procedure. COMPARISON:   None. FINDINGS: Imaging during ERCP demonstrates cannulation of the common bile duct with cholangiogram demonstrating diffuse dilatation of opacified bile ducts and suggestion of multiple filling defects in the common bile duct. There is evidence of prior cholecystectomy. Balloon sweep maneuver was performed to extract calculi. IMPRESSION: Choledocholithiasis and biliary dilatation. Balloon sweep maneuver was performed to extract calculi. These images were submitted for radiologic interpretation only. Please see the procedural report for the amount of contrast and the fluoroscopy time utilized. Electronically Signed   By: Aletta Edouard M.D.   On: 03/04/2019 09:25       Subjective: Reports he may have had 1 BM yesterday. denies abdominal pain, fulness or nausea.  Discharge Exam: Vitals:   03/30/19 2112 03/31/19 0648  BP: (!) 148/91 130/79  Pulse: 80 69  Resp: 20 20  Temp: 98.4 F (36.9 C) 98.6 F (37 C)  SpO2: 94% 96%   Vitals:   03/30/19 0552 03/30/19 1622 03/30/19 2112 03/31/19 0648  BP: 135/68 136/70 (!) 148/91 130/79  Pulse: 76 78 80 69  Resp: 16 17 20 20   Temp: 98.2 F (36.8 C) 97.9 F (36.6 C) 98.4 F (36.9 C) 98.6 F (37 C)  TempSrc: Oral Oral Oral Oral  SpO2: 98% 98% 94% 96%  Weight:    75.4 kg  Height:        General: Elderly male not in distress HEENT: Moist mucosa, supple neck Chest: Clear bilaterally CVs: Normal S1-S2 GI: Soft, nondistended, nontender, bowel sounds present Musculoskeletal: Warm, no edema    The results of significant diagnostics from this hospitalization (including imaging, microbiology, ancillary and laboratory) are listed below for reference.     Microbiology: Recent Results (from the past 240 hour(s))  SARS CORONAVIRUS 2     Status: None   Collection Time: 03/30/19  1:36 PM  Result Value Ref Range Status   SARS Coronavirus 2 NEGATIVE NEGATIVE Final    Comment: (NOTE) SARS-CoV-2 target nucleic acids are NOT DETECTED. The  SARS-CoV-2 RNA is generally detectable in upper and lower respiratory specimens during the acute phase of infection. Negative results do not preclude SARS-CoV-2 infection, do not rule out co-infections with other pathogens, and should not be used as the sole basis for treatment or other patient management decisions. Negative results must be combined with clinical observations, patient history, and epidemiological information. The expected result is Negative. Fact Sheet for Patients: SugarRoll.be Fact Sheet for Healthcare Providers: https://www.woods-mathews.com/ This test is not yet approved or cleared by the [place] FDA and  has been authorized for detection and/or diagnosis of SARS-CoV-2 by FDA under an Emergency Use Authorization (EUA). This EUA will remain  in effect (meaning this test can be used) for the duration of the COVID-19 declaration under Section 56 4(b)(1) of the Act, 21 U.S.C. section 360bbb-3(b)(1), unless the authorization is terminated or revoked sooner. Performed at [facility], [place] [address] [place], [place] [address]      Labs: BNP (last 3 results) No results for input(s): BNP in the last 8760 hours. Basic Metabolic Panel: Recent Labs  Lab 03/25/19 5681 03/26/19 2751 03/27/19 7001 03/28/19 0539 03/29/19 0532 03/30/19 7494  NA 136 138 140 139 137  --   K 3.7 3.1* 3.8 4.2 3.4*  --   CL 105 105 107 106 100  --   CO2 23 26 27 27 28   --   GLUCOSE 93 107* 97 99 102*  --   BUN 10 10 11 13 13   --   CREATININE 0.79 0.77 0.85 0.86 0.83  --   CALCIUM 7.4* 7.7* 7.8* 7.8* 7.7*  --   MG 1.8  --  1.8  --   --  1.9   Liver Function Tests: Recent Labs  Lab 03/26/19 0552  AST 27  ALT 25  ALKPHOS 58  BILITOT 0.5  PROT 5.2*  ALBUMIN 2.4*   No results for input(s): LIPASE, AMYLASE in the last 168 hours. No results for input(s): AMMONIA in the last 168 hours. CBC: Recent Labs  Lab 03/25/19 0538  03/26/19 0552 03/27/19 0552 03/28/19 0539 03/29/19 0532  WBC 9.4 11.7* 11.0* 10.1 8.5  HGB 10.8* 11.6* 10.8* 11.3* 11.6*  HCT 35.7* 36.8* 34.4* 36.7* 36.4*  MCV 94.4 91.8 93.5 94.8 92.4  PLT 339 376 371 360 367   Cardiac Enzymes: No results for input(s): CKTOTAL, CKMB, CKMBINDEX, TROPONINI in the last 168 hours. BNP: Invalid input(s): POCBNP CBG: No results for input(s): GLUCAP in the last 168 hours. D-Dimer No results for input(s): DDIMER in the last 72 hours. Hgb A1c No results for input(s): HGBA1C in the last 72 hours. Lipid Profile No results for input(s): CHOL, HDL, LDLCALC, TRIG, CHOLHDL, LDLDIRECT in the last 72 hours. Thyroid function studies No results for input(s): TSH, T4TOTAL, T3FREE, THYROIDAB in the last 72 hours.  Invalid input(s): FREET3 Anemia work up No results for input(s): VITAMINB12, FOLATE, FERRITIN, TIBC, IRON, RETICCTPCT in the last 72 hours. Urinalysis    Component Value Date/Time   COLORURINE AMBER (A) 03/18/2019 1103   APPEARANCEUR HAZY (A) 03/18/2019 1103   LABSPEC 1.019 03/18/2019 1103   PHURINE 5.0 03/18/2019 1103   GLUCOSEU NEGATIVE 03/18/2019 1103   HGBUR SMALL (A) 03/18/2019 1103   BILIRUBINUR NEGATIVE 03/18/2019 1103   BILIRUBINUR neg 12/24/2012 1324   KETONESUR 5 (A) 03/18/2019 1103   PROTEINUR NEGATIVE 03/18/2019 1103   UROBILINOGEN 1.0 10/03/2013 2148   NITRITE NEGATIVE 03/18/2019 1103   LEUKOCYTESUR TRACE (A) 03/18/2019 1103   Sepsis Labs Invalid input(s): PROCALCITONIN,  WBC,  LACTICIDVEN Microbiology Recent Results (from the past 240 hour(s))  SARS CORONAVIRUS 2     Status: None   Collection Time: 03/30/19  1:36 PM  Result Value Ref Range Status   SARS Coronavirus 2 NEGATIVE NEGATIVE Final    Comment: (NOTE) SARS-CoV-2 target nucleic acids are NOT DETECTED. The SARS-CoV-2 RNA is generally detectable in upper and lower respiratory specimens during the acute phase of infection. Negative results do not preclude SARS-CoV-2  infection, do not rule out co-infections with other pathogens, and should not be used as the sole basis for treatment or other patient management decisions. Negative results must be combined with clinical observations, patient history, and epidemiological information. The expected result is Negative. Fact Sheet for Patients: SugarRoll.be Fact Sheet for Healthcare Providers: https://www.woods-mathews.com/ This test is not yet approved or cleared by the [place] FDA and  has been authorized for detection and/or diagnosis of SARS-CoV-2 by FDA under an Emergency Use Authorization (EUA). This EUA will remain  in effect (meaning this test can be used) for the duration of the COVID-19 declaration under Section 56 4(b)(1) of the Act, 21 U.S.C. section  360bbb-3(b)(1), unless the authorization is terminated or revoked sooner. Performed at [facility], [place] [address] [place], [place] [address]      Time coordinating discharge: 35 minutes  SIGNED:   Louellen Molder, MD  [facility] 03/31/2019, 10:48 AM Pager   If 7PM-7AM, please contact night-coverage [email or web address] Password [email or web address]

## 2019-03-31 NOTE — TOC Transition Note (Signed)
Transition of Care [facility]) - CM/SW Discharge Note   Patient Details  Name: [name] [name] MRN: [id number] Date of Birth: 1930-05-16  [facility]) CM/SW Contact:  Nila Nephew, LCSW Phone Number: [phone] 03/31/2019, 10:23 AM   Clinical Narrative:   Pt returning to [place] today where he will admit to [facility]- report # [phone]. Will provide DC information and arrange [facility] transport. (Room will be ready for pt after 12pm, will arrange transport accordingly)      Barriers to Discharge: no barriers   Patient Goals and CMS Choice Patient states their goals for this hospitalization and ongoing recovery are:: back to activity CMS Medicare.gov Compare Post Acute Care list provided to:: (NA) Choice offered to / list presented to : NA  Discharge Placement                       Discharge Plan and Services In-house Referral: Clinical Social Work   Post Acute Care Choice: [place]                               Social Determinants of Health (SDOH) Interventions     Readmission Risk Interventions No flowsheet data found.

## 2019-03-31 NOTE — Care Management Important Message (Signed)
Important Message  Patient Details IM Letter given to Sharren Bridge SW to present to the Patient Name: [name] [name] MRN: [id number] Date of Birth: 1930-08-07   Medicare Important Message Given:  Yes     Kerin Salen 03/31/2019, 9:49 AM

## 2019-03-31 NOTE — Progress Notes (Signed)
Report called to [facility]'s RN. Pts IV removed with a clean and dry dressing intact. Pt denies pain at this time with no s/s of distress noted. Pt to transport by [facility] arranged by LCSW.

## 2019-04-01 DIAGNOSIS — M6389 Disorders of muscle in diseases classified elsewhere, multiple sites: Secondary | ICD-10-CM | POA: Diagnosis not present

## 2019-04-01 DIAGNOSIS — A0471 Enterocolitis due to Clostridium difficile, recurrent: Secondary | ICD-10-CM | POA: Diagnosis not present

## 2019-04-01 DIAGNOSIS — R278 Other lack of coordination: Secondary | ICD-10-CM | POA: Diagnosis not present

## 2019-04-01 DIAGNOSIS — K580 Irritable bowel syndrome with diarrhea: Secondary | ICD-10-CM | POA: Diagnosis not present

## 2019-04-01 DIAGNOSIS — R600 Localized edema: Secondary | ICD-10-CM | POA: Diagnosis not present

## 2019-04-01 DIAGNOSIS — R2689 Other abnormalities of gait and mobility: Secondary | ICD-10-CM | POA: Diagnosis not present

## 2019-04-02 DIAGNOSIS — M6389 Disorders of muscle in diseases classified elsewhere, multiple sites: Secondary | ICD-10-CM | POA: Diagnosis not present

## 2019-04-02 DIAGNOSIS — K580 Irritable bowel syndrome with diarrhea: Secondary | ICD-10-CM | POA: Diagnosis not present

## 2019-04-02 DIAGNOSIS — R278 Other lack of coordination: Secondary | ICD-10-CM | POA: Diagnosis not present

## 2019-04-02 DIAGNOSIS — R2689 Other abnormalities of gait and mobility: Secondary | ICD-10-CM | POA: Diagnosis not present

## 2019-04-02 DIAGNOSIS — R600 Localized edema: Secondary | ICD-10-CM | POA: Diagnosis not present

## 2019-04-02 DIAGNOSIS — A0471 Enterocolitis due to Clostridium difficile, recurrent: Secondary | ICD-10-CM | POA: Diagnosis not present

## 2019-04-04 DIAGNOSIS — M6389 Disorders of muscle in diseases classified elsewhere, multiple sites: Secondary | ICD-10-CM | POA: Diagnosis not present

## 2019-04-04 DIAGNOSIS — R2689 Other abnormalities of gait and mobility: Secondary | ICD-10-CM | POA: Diagnosis not present

## 2019-04-04 DIAGNOSIS — K580 Irritable bowel syndrome with diarrhea: Secondary | ICD-10-CM | POA: Diagnosis not present

## 2019-04-04 DIAGNOSIS — R278 Other lack of coordination: Secondary | ICD-10-CM | POA: Diagnosis not present

## 2019-04-04 DIAGNOSIS — A0471 Enterocolitis due to Clostridium difficile, recurrent: Secondary | ICD-10-CM | POA: Diagnosis not present

## 2019-04-04 DIAGNOSIS — R600 Localized edema: Secondary | ICD-10-CM | POA: Diagnosis not present

## 2019-04-05 ENCOUNTER — Encounter: Payer: Self-pay | Admitting: Internal Medicine

## 2019-04-05 ENCOUNTER — Non-Acute Institutional Stay (SKILLED_NURSING_FACILITY): Payer: Medicare Other | Admitting: Internal Medicine

## 2019-04-05 DIAGNOSIS — J810 Acute pulmonary edema: Secondary | ICD-10-CM

## 2019-04-05 DIAGNOSIS — R2689 Other abnormalities of gait and mobility: Secondary | ICD-10-CM | POA: Diagnosis not present

## 2019-04-05 DIAGNOSIS — A0471 Enterocolitis due to Clostridium difficile, recurrent: Secondary | ICD-10-CM

## 2019-04-05 DIAGNOSIS — K5100 Ulcerative (chronic) pancolitis without complications: Secondary | ICD-10-CM

## 2019-04-05 DIAGNOSIS — R278 Other lack of coordination: Secondary | ICD-10-CM | POA: Diagnosis not present

## 2019-04-05 DIAGNOSIS — R531 Weakness: Secondary | ICD-10-CM

## 2019-04-05 DIAGNOSIS — I498 Other specified cardiac arrhythmias: Secondary | ICD-10-CM

## 2019-04-05 DIAGNOSIS — E440 Moderate protein-calorie malnutrition: Secondary | ICD-10-CM

## 2019-04-05 DIAGNOSIS — N179 Acute kidney failure, unspecified: Secondary | ICD-10-CM

## 2019-04-05 DIAGNOSIS — K580 Irritable bowel syndrome with diarrhea: Secondary | ICD-10-CM | POA: Diagnosis not present

## 2019-04-05 DIAGNOSIS — M6389 Disorders of muscle in diseases classified elsewhere, multiple sites: Secondary | ICD-10-CM | POA: Diagnosis not present

## 2019-04-05 DIAGNOSIS — I25810 Atherosclerosis of coronary artery bypass graft(s) without angina pectoris: Secondary | ICD-10-CM

## 2019-04-05 DIAGNOSIS — R600 Localized edema: Secondary | ICD-10-CM | POA: Diagnosis not present

## 2019-04-05 DIAGNOSIS — E876 Hypokalemia: Secondary | ICD-10-CM

## 2019-04-05 NOTE — Progress Notes (Signed)
Provider:  Rexene Edison. Mariea Clonts, D.O., C.M.D. Location:  [facility] Room Number: [place] of Service:  SNF (31)  PCP: [name], MD Patient Care Team: [name], MD as PCP - Geoffry Paradise, MD as Consulting Physician (Physical Medicine and Rehabilitation) Martinique, Peter M, MD as Consulting Physician (Cardiology) [name], MD (Inactive) as Consulting Physician (Gastroenterology)  Extended Emergency Contact Information Primary Emergency Contact: Deike,Martha Address: [address]          [place], [place] [address] Johnnette Litter of [place] Phone: [phone] Relation: Spouse Secondary Emergency Contact: Blumberg,Gregory Address: [address]          [place], [place] [address] Johnnette Litter of [place] Phone: [phone] Mobile Phone: [phone] Relation: Son  Code Status: FULL CODE Goals of Care: Advanced Directive information Advanced Directives 03/18/2019  Does Patient Have a Medical Advance Directive? Yes  Type of Paramedic of [place];Living will  Does patient want to make changes to medical advance directive? No - Patient declined  Copy of [place] in Chart? No - copy requested   Chief Complaint  Patient presents with  . New Admit To SNF    s/p hospitalization 8/7-8/20/20 with C diff colitis    HPI: Patient is a 85 y.o. male with h/o CAD s/p cabg x 1, mitral valve repair, htn, wandering atrial pacemaker, C diff x 2 before this treated with 2 wk courses of vancomycin seen today for admission to [facility] short term rehab s/p prolonged hospitalization with recurrent C diff colitis complicated by hypokalemia, acute on chronic diastolic chf, acute kidney injury, moderate malnutrition and acute pulmonary edema.  He had presented initially with 2 wks of diarrhea, left-sided abdominal pain, poor appetite and weakness.  CT abd/pelvis showed pancolitis and pt was treated with  prolonged course of vanc taper--remains on this currently and will go at least through early october.  He was then started on dificid and colestid as well.  He actually required flexi-seal rectal tube at one point.  His loose stools gradually improved and he'd been off the tube for 48 hrs and had just one bm in that time before d/c here.  He was to complete three more days of the colestid bid here and continue on a probiotic.  He is to f/u with GI as scheduled 1130am on 04/13/19 with Tye Savoy, NP.  Zinplava infusion being considered to prevent recurrence.  He also continues on dificid and got enough from the pharmacy to get him through to his GI appt--it's not clear how long it will continue.  The vanc taper may go through October depending on GI input.  He suffered acute pulmonary edema after hydration due to acute on chronic diastolic chf.  He received intermittent lasix with resolution of this and resolution of his acute kidney injury.  Hctz/arb had also been held due to AKI and resumed at d/c.  He had good urine output and appeared euvolemic at d/c.  His electrolytes of potassium and magnesium were due to GI loss and repleted. Protein supplement was added for malnutrition (boost breeze).  He did have sinus bradycardia which remains in the 50s.    After all of this, he was quite weak and PT eval recommended short-term SNF.    Covid 19 test was negative 8/7 and again 8/19.  The following meds were stopped during hospitalization:  Tylenol, amlodipine and benefiber.  When seen, he reported doing SO much better  than he'd been. He's having regular normal BMs now.  He's still feeling a bit weak and fatigued, but surprised himself during his home safety check with OT and while walking without an assistive device with PT earlier today.  He is happy about the prospect of going home with his wife tomorrow.    Past Medical History:  Diagnosis Date  . Arthritis   . BPH (benign prostatic hyperplasia)   .  C. difficile colitis 04/20/2018  . CAD (coronary artery disease)    s/p CABG x 1  . DJD (degenerative joint disease)   . Gallstones   . HTN (hypertension)   . Hyperlipidemia   . Lumbar disc disease   . MVP (mitral valve prolapse)    s/p Mitral Valve Repair  . Pancreatitis   . Wandering (atrial) pacemaker 11/22/2013   with frequent multifocal ectopy (PACs, PVCs, junctional escape beats   Past Surgical History:  Procedure Laterality Date  . CHOLECYSTECTOMY     Dr. Lennie Hummer  . COLONOSCOPY  ~2008   no polyps per pt.  Dr. [name]  . CORONARY ARTERY BYPASS GRAFT  January 2002   Single bypass to the distal RCA  . ERCP N/A 02/22/2013   Procedure: ENDOSCOPIC RETROGRADE CHOLANGIOPANCREATOGRAPHY (ERCP);  Surgeon: [name], MD;  Location: [facility];  Service: Endoscopy;  Laterality: N/A;  . ERCP N/A 09/21/2015   Procedure: ENDOSCOPIC RETROGRADE CHOLANGIOPANCREATOGRAPHY (ERCP);  Surgeon: [name], MD;  Location: [facility] ENDOSCOPY;  Service: Endoscopy;  Laterality: N/A;  with spyglass  . ERCP N/A 03/04/2019   Procedure: ENDOSCOPIC RETROGRADE CHOLANGIOPANCREATOGRAPHY (ERCP);  Surgeon: [name], MD;  Location: [facility];  Service: Endoscopy;  Laterality: N/A;  . HERNIA REPAIR  05/05/11   Lap L inguinal & obturator herniae, R Femoral Hernia  . MITRAL VALVE REPAIR  January 2002  . REMOVAL OF STONES  03/04/2019   Procedure: REMOVAL OF STONES;  Surgeon: [name], MD;  Location: [facility] ENDOSCOPY;  Service: Endoscopy;;  . Bess Kinds CHOLANGIOSCOPY N/A 02/22/2013   Procedure: QBHALPFX CHOLANGIOSCOPY;  Surgeon: [name], MD;  Location: [facility] ENDOSCOPY;  Service: Endoscopy;  Laterality: N/A;  . SPYGLASS CHOLANGIOSCOPY N/A 09/21/2015   Procedure: TKWIOXBD CHOLANGIOSCOPY;  Surgeon: [name], MD;  Location: [facility];  Service: Endoscopy;  Laterality: N/A;  Bess Kinds LITHOTRIPSY N/A 02/22/2013   Procedure: ZHGDJMEQ LITHOTRIPSY;  Surgeon: [name], MD;  Location:  [facility] ENDOSCOPY;  Service: Endoscopy;  Laterality: N/A;  litho ord'd 7/8 by DL/PO [id number] [facility]  . UPPER GASTROINTESTINAL ENDOSCOPY  02/22/13    Social History   Socioeconomic History  . Marital status: Married    Spouse name: Not on file  . Number of children: 2  . Years of education: Not on file  . Highest education level: Not on file  Occupational History  . Occupation: Retired    Comment: Passenger transport manager: RETIRED  Social Needs  . Financial resource strain: Somewhat hard  . Food insecurity    Worry: Sometimes true    Inability: Sometimes true  . Transportation needs    Medical: No    Non-medical: No  Tobacco Use  . Smoking status: Never Smoker  . Smokeless tobacco: Never Used  Substance and Sexual Activity  . Alcohol use: Yes    Alcohol/week: 1.0 - 2.0 standard drinks    Types: 1 - 2 Glasses of wine per week    Comment: Ocassionally  . Drug use: No  .  Sexual activity: Not Currently    Partners: Female  Lifestyle  . Physical activity    Days per week: 2 days    Minutes per session: 20 min  . Stress: To some extent  Relationships  . Social connections    Talks on phone: More than three times a week    Gets together: Twice a week    Attends religious service: 1 to 4 times per year    Active member of club or organization: Yes    Attends meetings of clubs or organizations: 1 to 4 times per year    Relationship status: Married  Other Topics Concern  . Not on file  Social History Narrative   Married, retired from [employer], lives at [employer]   Occasional alcohol not a smoker no drug use   2 children    reports that he has never smoked. He has never used smokeless tobacco. He reports current alcohol use of about 1.0 - 2.0 standard drinks of alcohol per week. He reports that he does not use drugs.  Functional Status Survey:    Family History  Problem Relation Age of Onset  . Stroke Father   . Stroke Mother        TIA  . Esophageal cancer Neg  Hx     Health Maintenance  Topic Date Due  . TETANUS/TDAP  12/30/2005  . INFLUENZA VACCINE  03/12/2019  . PNA vac Low Risk Adult  Completed    No Known Allergies  Outpatient Encounter Medications as of 04/05/2019  Medication Sig  . aspirin 81 MG tablet Take 81 mg by mouth daily with supper.   Marland Kitchen b complex vitamins tablet Take 1 tablet by mouth daily.  . Cholecalciferol (VITAMIN D) 1000 UNITS capsule Take 1,000 Units by mouth daily.    Marland Kitchen dutasteride (AVODART) 0.5 MG capsule Take 0.5 mg by mouth every evening.   . famotidine (PEPCID) 20 MG tablet Take 20 mg by mouth daily as needed for heartburn or indigestion.  . fidaxomicin (DIFICID) 200 MG TABS tablet Take 1 tablet (200 mg total) by mouth 2 (two) times daily.  Marland Kitchen gabapentin (NEURONTIN) 300 MG capsule Take 300 mg by mouth 4 (four) times daily.  Marland Kitchen GLUCOSAMINE-CHONDROITIN PO Take 1 tablet by mouth 2 (two) times a day.   Marland Kitchen HYDROcodone-acetaminophen (NORCO/VICODIN) 5-325 MG tablet Take 1 tablet by mouth every 6 (six) hours as needed for moderate pain.  Marland Kitchen MYRBETRIQ 50 MG TB24 tablet Take 1 tablet by mouth daily with supper.   . Nutritional Supplements (FEEDING SUPPLEMENT, BOOST BREEZE,) LIQD Take 1 Can by mouth 2 (two) times daily with a meal.  . nystatin (NYSTATIN) powder Apply 1 Bottle topically 2 (two) times daily.  [name] Glycol-Propyl Glycol (SYSTANE OP) Place 1 drop into both eyes daily as needed (dry eyes).  . rosuvastatin (CRESTOR) 20 MG tablet Take 1 tablet (20 mg total) by mouth daily.  Marland Kitchen saccharomyces boulardii (FLORASTOR) 250 MG capsule Take 1 capsule (250 mg total) by mouth 2 (two) times daily.  . Simethicone (GAS-X PO) Take 1 tablet by mouth daily as needed (gas).   . Tamsulosin HCl (FLOMAX) 0.4 MG CAPS Take 0.4 mg by mouth every evening.   . valsartan-hydrochlorothiazide (DIOVAN-HCT) 320-12.5 MG tablet Take 1 tablet by mouth every morning.  . zolpidem (AMBIEN) 5 MG tablet Take 5 mg by mouth at bedtime as needed for sleep.    . [DISCONTINUED] vancomycin (VANCOCIN) 50 mg/mL oral solution Take 5 mLs (250 mg total)  by mouth 4 (four) times daily. Take 250 mg 4 times a day for 2 weeks (until 9/3) Then 250 mg 2 times a day for 7 days (9/4-9/10) Then 250 mg daily for next 7 days ( 9/11-9/18) Then 250 mg every other day for 14 days (9/19-10/4) Then 250 mg every 3 days until further instruction by the gastroenterologist  . [DISCONTINUED] colestipol (COLESTID) 1 g tablet Take 1 tablet (1 g total) by mouth 2 (two) times daily for 3 days.   No facility-administered encounter medications on file as of 04/05/2019.     Review of Systems  Constitutional: Positive for malaise/fatigue. Negative for chills and fever.       Lost weight before and during hospitalization, but reports eating well here and being certain he's gaining again--appears he lost b/w 10-15 lbs with this c diff episode  HENT: Negative for congestion.   Eyes: Negative for blurred vision.       Glasses  Respiratory: Negative for cough and shortness of breath.   Cardiovascular: Negative for chest pain, palpitations and leg swelling.  Gastrointestinal: Negative for abdominal pain, blood in stool, constipation, diarrhea and melena.  Genitourinary: Negative for dysuria.  Musculoskeletal: Negative for falls and joint pain.  Skin: Negative for itching and rash.  Neurological: Negative for dizziness and loss of consciousness.  Endo/Heme/Allergies: Bruises/bleeds easily.  Psychiatric/Behavioral: Negative for depression and memory loss. The patient is not nervous/anxious and does not have insomnia.     Vitals:   04/05/19 1332  BP: (!) 106/58  Pulse: 69  Resp: 16  Temp: 98.2 F (36.8 C)  TempSrc: Oral  SpO2: 95%  Weight: 164 lb (74.4 kg)  Height: 6' 2"  (1.88 m)   Body mass index is 21.06 kg/m. Physical Exam Vitals signs and nursing note reviewed.  Constitutional:      General: He is not in acute distress.    Appearance: Normal appearance. He is not  toxic-appearing.  HENT:     Head: Normocephalic and atraumatic.     Right Ear: External ear normal.     Left Ear: External ear normal.     Nose: No congestion.     Mouth/Throat:     Pharynx: Oropharynx is clear.  Eyes:     Conjunctiva/sclera: Conjunctivae normal.     Pupils: Pupils are equal, round, and reactive to light.     Comments: glasses  Neck:     Musculoskeletal: Neck supple.  Cardiovascular:     Rate and Rhythm: Rhythm irregular.     Heart sounds: No murmur.  Pulmonary:     Effort: Pulmonary effort is normal.     Breath sounds: Normal breath sounds. No wheezing, rhonchi or rales.  Abdominal:     General: Bowel sounds are normal. There is no distension.     Palpations: Abdomen is soft. There is no mass.     Tenderness: There is no abdominal tenderness. There is no guarding or rebound.  Musculoskeletal: Normal range of motion.     Right lower leg: No edema.     Left lower leg: No edema.  Skin:    General: Skin is warm and dry.     Capillary Refill: Capillary refill takes less than 2 seconds.  Neurological:     General: No focal deficit present.     Mental Status: He is alert and oriented to person, place, and time.     Cranial Nerves: No cranial nerve deficit.     Motor: No weakness.  Gait: Gait normal.  Psychiatric:        Mood and Affect: Mood normal.        Behavior: Behavior normal.        Thought Content: Thought content normal.        Judgment: Judgment normal.    Labs reviewed: Basic Metabolic Panel: Recent Labs    03/04/19 0351  03/20/19 0529  03/25/19 0538  03/27/19 0552 03/28/19 0539 03/29/19 0532 03/30/19 0852  NA 135   < > 139   < > 136   < > 140 139 137  --   K 3.1*   < > 3.2*   < > 3.7   < > 3.8 4.2 3.4*  --   CL 102   < > 111   < > 105   < > 107 106 100  --   CO2 24   < > 22   < > 23   < > 27 27 28   --   GLUCOSE 95   < > 107*   < > 93   < > 97 99 102*  --   BUN 13   < > 23   < > 10   < > 11 13 13   --   CREATININE 1.03   < > 0.93   <  > 0.79   < > 0.85 0.86 0.83  --   CALCIUM 7.3*   < > 7.5*   < > 7.4*   < > 7.8* 7.8* 7.7*  --   MG 1.4*   < > 2.0   < > 1.8  --  1.8  --   --  1.9  PHOS 2.2*  --  2.9  --   --   --   --   --   --   --    < > = values in this interval not displayed.   Liver Function Tests: Recent Labs    03/19/19 0519 03/20/19 0529 03/26/19 0552  AST 17 14* 27  ALT 15 13 25   ALKPHOS 80 65 58  BILITOT 1.2 0.6 0.5  PROT 5.1* 4.6* 5.2*  ALBUMIN 2.3* 2.1* 2.4*   Recent Labs    03/03/19 1417 03/05/19 0428 03/18/19 1103  LIPASE 27 25 20    No results for input(s): AMMONIA in the last 8760 hours. CBC: Recent Labs    03/15/19 1200 03/18/19 1103  03/21/19 1148  03/27/19 0552 03/28/19 0539 03/29/19 0532  WBC 10.1 20.6*   < > 10.1   < > 11.0* 10.1 8.5  NEUTROABS 8.5* 18.5*  --  8.5*  --   --   --   --   HGB 10.9* 10.8*   < > 10.9*   < > 10.8* 11.3* 11.6*  HCT 33.2* 32.8*   < > 34.2*   < > 34.4* 36.7* 36.4*  MCV 89 91.6   < > 91.7   < > 93.5 94.8 92.4  PLT 274 241   < > 278   < > 371 360 367   < > = values in this interval not displayed.   Cardiac Enzymes: No results for input(s): CKTOTAL, CKMB, CKMBINDEX, TROPONINI in the last 8760 hours. BNP: Invalid input(s): POCBNP No results found for: HGBA1C Lab Results  Component Value Date   TSH 2.510 09/04/2017   No results found for: VITAMINB12 No results found for: FOLATE No results found for: IRON, TIBC, FERRITIN  Imaging and Procedures obtained prior to SNF admission: Ct  Abdomen Pelvis W Contrast  Result Date: 03/18/2019 CLINICAL DATA:  Generalized abdominal pain with weakness and diarrhea. Recent Clostridium difficile infection. Diverticulitis suspected. EXAM: CT ABDOMEN AND PELVIS WITH CONTRAST TECHNIQUE: Multidetector CT imaging of the abdomen and pelvis was performed using the standard protocol following bolus administration of intravenous contrast. CONTRAST:  28m OMNIPAQUE IOHEXOL 300 MG/ML  SOLN COMPARISON:  Abdominopelvic CT  08/16/2014.  MRCP 09/20/2015. FINDINGS: Lower chest: There are new patchy dependent opacities at both lung bases, likely atelectasis. There is a small right pleural effusion. The heart is enlarged post median sternotomy. There is atherosclerosis of the aorta and coronary arteries and prominent calcifications of the aortic valve. Hepatobiliary: Cystic lesion peripherally in the left hepatic lobe measuring 3.6 cm on image 21/2 is similar to the previous study. There is similar pneumobilia with intra and extrahepatic biliary dilatation. The common hepatic duct measures up to 1.6 cm in diameter. No new hepatic lesions identified. Pancreas: Stable mild dilatation of the pancreatic duct. No focal pancreatic abnormality or surrounding inflammation. Spleen: Normal in size without focal abnormality. Adrenals/Urinary Tract: Both adrenal glands appear normal. Similar dominant cyst involving the upper pole of the right kidney, bilateral nephrolithiasis and bilateral renal sinus cysts. The contrast bolus is suboptimal, and no significant contrast excretion is identified on early delayed post-contrast images through the kidneys. There is no hydronephrosis or evidence of ureteral calculus. The bladder appears normal. Stomach/Bowel: No enteric contrast was administered. The stomach is decompressed. Small bowel appears unremarkable. There is moderate diffuse colonic wall thickening consistent with colitis. Underlying diverticular changes are present within the descending and sigmoid colon, and the wall thickening in this area appears slightly greater. No evidence of bowel perforation, obstruction or abscess. Vascular/Lymphatic: There are no enlarged abdominal or pelvic lymph nodes. Moderate aortic and branch vessel atherosclerosis. No acute vascular findings. Reproductive: The prostate gland is moderately enlarged, but stable. Other: Stable subxiphoid hernia containing fat.  No ascites. Musculoskeletal: No acute osseous findings.  Stable multilevel spondylosis associated with a convex right scoliosis. IMPRESSION: 1. Diffuse colonic wall thickening and mild surrounding inflammation consistent with pancolitis, presumably from the patient's known Clostridium difficile colitis. There are underlying moderate diverticular changes in the sigmoid colon, and the wall thickening in this area is slightly greater, potentially due to superimposed diverticulitis. 2. No evidence of bowel obstruction, perforation or abscess. 3. Stable chronic biliary dilatation and pneumobilia. 4. New small right pleural effusion and probable bibasilar atelectasis. 5. Stable additional incidental findings including calcifications of the aortic valve, hepatic and renal cysts, bilateral nephrolithiasis, a ventral abdominal wall hernia, lumbar spondylosis and Aortic Atherosclerosis (ICD10-I70.0). Electronically Signed   By: WRichardean SaleM.D.   On: 03/18/2019 13:20    Assessment/Plan 1. Recurrent colitis due to Clostridium difficile -complete dificid, vanc taper as per GI and keep f/u with them 9/2  2. Acute pulmonary edema (HCC) -resolved with diuresis--suspect due to fluids given for his volume depletion from GI losses  3. Moderate malnutrition ([place] -gradually improving with good po intake now and weight trending back up  4. Acute kidney injury ([place] -resolved after hydration  5. Pancolitis ([place] -due to recurrent c diff -clinically resolved with normal bms now  6. Hypokalemia -resolved while hospitalized with potassium repletion and end of diarrhea and when diuresis for pulmonary edema completed  7. Coronary artery disease involving coronary bypass graft of native heart without angina pectoris -no active concerns, remains on asa, crestor, arb  8. Wandering pacemaker -notable in history and has irregular  pulse  9. Generalized weakness -almost back to baseline, is walking independently again and feels safe to return home  D/c home to [place] F/u  PCP within 2 weeks F/u [facility] on 9/2 at 11:30am  Family/ staff Communication: discussed with snf nurse and therapy  Labs/tests ordered:  No new; would suggest cbc and bmp at PCP f/u due to vanc use   Advised pt to f/u with PCP within two weeks for TOC visit  Edin Kon L. Meleni Delahunt, D.O. [facility] [address], [place] [address] Cell Phone (Mon-Fri 8am-5pm):  [phone] On Call:  [phone] & follow prompts after 5pm & weekends Office Phone:  [phone] Office Fax:  [phone]

## 2019-04-06 ENCOUNTER — Other Ambulatory Visit: Payer: Self-pay | Admitting: Internal Medicine

## 2019-04-06 ENCOUNTER — Ambulatory Visit: Payer: Medicare Other | Admitting: Internal Medicine

## 2019-04-06 DIAGNOSIS — M6389 Disorders of muscle in diseases classified elsewhere, multiple sites: Secondary | ICD-10-CM | POA: Diagnosis not present

## 2019-04-06 DIAGNOSIS — R600 Localized edema: Secondary | ICD-10-CM | POA: Diagnosis not present

## 2019-04-06 DIAGNOSIS — A0471 Enterocolitis due to Clostridium difficile, recurrent: Secondary | ICD-10-CM | POA: Diagnosis not present

## 2019-04-06 DIAGNOSIS — R2689 Other abnormalities of gait and mobility: Secondary | ICD-10-CM | POA: Diagnosis not present

## 2019-04-06 DIAGNOSIS — K580 Irritable bowel syndrome with diarrhea: Secondary | ICD-10-CM | POA: Diagnosis not present

## 2019-04-06 DIAGNOSIS — R278 Other lack of coordination: Secondary | ICD-10-CM | POA: Diagnosis not present

## 2019-04-06 MED ORDER — VANCOMYCIN 50 MG/ML ORAL SOLUTION: ORAL | 0 refills | Status: AC

## 2019-04-07 DIAGNOSIS — A0471 Enterocolitis due to Clostridium difficile, recurrent: Secondary | ICD-10-CM | POA: Diagnosis not present

## 2019-04-07 DIAGNOSIS — R2689 Other abnormalities of gait and mobility: Secondary | ICD-10-CM | POA: Diagnosis not present

## 2019-04-07 DIAGNOSIS — R278 Other lack of coordination: Secondary | ICD-10-CM | POA: Diagnosis not present

## 2019-04-07 DIAGNOSIS — M6389 Disorders of muscle in diseases classified elsewhere, multiple sites: Secondary | ICD-10-CM | POA: Diagnosis not present

## 2019-04-07 DIAGNOSIS — K580 Irritable bowel syndrome with diarrhea: Secondary | ICD-10-CM | POA: Diagnosis not present

## 2019-04-07 DIAGNOSIS — R600 Localized edema: Secondary | ICD-10-CM | POA: Diagnosis not present

## 2019-04-10 DIAGNOSIS — I499 Cardiac arrhythmia, unspecified: Secondary | ICD-10-CM | POA: Diagnosis not present

## 2019-04-12 ENCOUNTER — Telehealth: Payer: Self-pay | Admitting: Internal Medicine

## 2019-04-12 ENCOUNTER — Telehealth: Payer: Self-pay | Admitting: Family Medicine

## 2019-04-12 NOTE — Telephone Encounter (Signed)
Forbis and Barbarann Ehlers dropped off death certificate.

## 2019-04-12 NOTE — Telephone Encounter (Signed)
Daughter in law called to inform that pt is deceased.

## 2019-04-12 NOTE — Telephone Encounter (Signed)
Dr. Lorelei Pont

## 2019-04-12 DEATH — deceased

## 2019-04-13 ENCOUNTER — Ambulatory Visit: Payer: Medicare Other | Admitting: Nurse Practitioner

## 2019-04-19 NOTE — Telephone Encounter (Signed)
I spoke to son Marya Amsler and expressed my condolences. His mom was with him and he relayed to her also.

## 2019-04-26 ENCOUNTER — Ambulatory Visit: Payer: Medicare Other | Admitting: Cardiology
